# Patient Record
Sex: Male | Born: 1935 | Race: White | Hispanic: No | Marital: Married | Smoking: Former smoker
Health system: Southern US, Community
[De-identification: ages and names within clinical notes are randomized; demographics above are authoritative.]

## PROBLEM LIST (undated history)

## (undated) DIAGNOSIS — K5730 Diverticulosis of large intestine without perforation or abscess without bleeding: Secondary | ICD-10-CM

## (undated) DIAGNOSIS — J449 Chronic obstructive pulmonary disease, unspecified: Secondary | ICD-10-CM

## (undated) DIAGNOSIS — Z9889 Other specified postprocedural states: Secondary | ICD-10-CM

## (undated) DIAGNOSIS — G4730 Sleep apnea, unspecified: Secondary | ICD-10-CM

## (undated) DIAGNOSIS — C679 Malignant neoplasm of bladder, unspecified: Secondary | ICD-10-CM

## (undated) DIAGNOSIS — T4145XA Adverse effect of unspecified anesthetic, initial encounter: Secondary | ICD-10-CM

## (undated) DIAGNOSIS — R351 Nocturia: Secondary | ICD-10-CM

## (undated) DIAGNOSIS — R0989 Other specified symptoms and signs involving the circulatory and respiratory systems: Secondary | ICD-10-CM

## (undated) DIAGNOSIS — R0902 Hypoxemia: Secondary | ICD-10-CM

## (undated) DIAGNOSIS — Z85850 Personal history of malignant neoplasm of thyroid: Secondary | ICD-10-CM

## (undated) DIAGNOSIS — E039 Hypothyroidism, unspecified: Secondary | ICD-10-CM

## (undated) DIAGNOSIS — I452 Bifascicular block: Secondary | ICD-10-CM

## (undated) DIAGNOSIS — E890 Postprocedural hypothyroidism: Secondary | ICD-10-CM

## (undated) DIAGNOSIS — Z9981 Dependence on supplemental oxygen: Secondary | ICD-10-CM

## (undated) DIAGNOSIS — K219 Gastro-esophageal reflux disease without esophagitis: Secondary | ICD-10-CM

## (undated) DIAGNOSIS — I519 Heart disease, unspecified: Secondary | ICD-10-CM

## (undated) DIAGNOSIS — D509 Iron deficiency anemia, unspecified: Secondary | ICD-10-CM

## (undated) DIAGNOSIS — M1990 Unspecified osteoarthritis, unspecified site: Secondary | ICD-10-CM

## (undated) DIAGNOSIS — I4510 Unspecified right bundle-branch block: Secondary | ICD-10-CM

## (undated) DIAGNOSIS — H269 Unspecified cataract: Secondary | ICD-10-CM

## (undated) DIAGNOSIS — N400 Enlarged prostate without lower urinary tract symptoms: Secondary | ICD-10-CM

## (undated) DIAGNOSIS — K5909 Other constipation: Secondary | ICD-10-CM

## (undated) DIAGNOSIS — I10 Essential (primary) hypertension: Secondary | ICD-10-CM

## (undated) DIAGNOSIS — Z86010 Personal history of colonic polyps: Secondary | ICD-10-CM

## (undated) DIAGNOSIS — Z85828 Personal history of other malignant neoplasm of skin: Secondary | ICD-10-CM

## (undated) DIAGNOSIS — J439 Emphysema, unspecified: Secondary | ICD-10-CM

## (undated) DIAGNOSIS — Z973 Presence of spectacles and contact lenses: Secondary | ICD-10-CM

## (undated) DIAGNOSIS — J984 Other disorders of lung: Secondary | ICD-10-CM

## (undated) DIAGNOSIS — E785 Hyperlipidemia, unspecified: Secondary | ICD-10-CM

## (undated) DIAGNOSIS — R0600 Dyspnea, unspecified: Secondary | ICD-10-CM

## (undated) HISTORY — PX: EYE SURGERY: SHX253

## (undated) HISTORY — PX: CATARACT EXTRACTION W/ INTRAOCULAR LENS  IMPLANT, BILATERAL: SHX1307

## (undated) HISTORY — PX: POLYPECTOMY: SHX149

## (undated) HISTORY — DX: Iron deficiency anemia, unspecified: D50.9

## (undated) HISTORY — DX: Hyperlipidemia, unspecified: E78.5

## (undated) HISTORY — PX: COLONOSCOPY: SHX174

## (undated) HISTORY — DX: Hypoxemia: R09.02

## (undated) HISTORY — PX: CARDIOVASCULAR STRESS TEST: SHX262

## (undated) HISTORY — DX: Emphysema, unspecified: J43.9

## (undated) HISTORY — DX: Unspecified cataract: H26.9

## (undated) HISTORY — DX: Essential (primary) hypertension: I10

## (undated) HISTORY — DX: Bifascicular block: I45.2

## (undated) HISTORY — DX: Personal history of malignant neoplasm of thyroid: Z85.850

## (undated) HISTORY — DX: Hypothyroidism, unspecified: E03.9

---

## 1989-01-11 HISTORY — PX: TOTAL THYROIDECTOMY: SHX2547

## 1997-07-19 ENCOUNTER — Other Ambulatory Visit: Admission: RE | Admit: 1997-07-19 | Discharge: 1997-07-19 | Payer: Self-pay | Admitting: *Deleted

## 1997-08-12 ENCOUNTER — Ambulatory Visit (HOSPITAL_COMMUNITY): Admission: RE | Admit: 1997-08-12 | Discharge: 1997-08-12 | Payer: Self-pay | Admitting: *Deleted

## 1997-08-21 ENCOUNTER — Ambulatory Visit (HOSPITAL_COMMUNITY): Admission: RE | Admit: 1997-08-21 | Discharge: 1997-08-21 | Payer: Self-pay | Admitting: *Deleted

## 1997-09-11 ENCOUNTER — Inpatient Hospital Stay (HOSPITAL_COMMUNITY): Admission: RE | Admit: 1997-09-11 | Discharge: 1997-09-12 | Payer: Self-pay

## 1998-09-13 ENCOUNTER — Emergency Department (HOSPITAL_COMMUNITY): Admission: EM | Admit: 1998-09-13 | Discharge: 1998-09-13 | Payer: Self-pay

## 2001-11-07 ENCOUNTER — Encounter: Admission: RE | Admit: 2001-11-07 | Discharge: 2001-11-07 | Payer: Self-pay | Admitting: Internal Medicine

## 2001-11-07 ENCOUNTER — Encounter: Payer: Self-pay | Admitting: Internal Medicine

## 2001-11-09 ENCOUNTER — Encounter: Payer: Self-pay | Admitting: Internal Medicine

## 2001-11-09 ENCOUNTER — Encounter: Admission: RE | Admit: 2001-11-09 | Discharge: 2001-11-09 | Payer: Self-pay | Admitting: Internal Medicine

## 2002-01-11 DIAGNOSIS — Z860100 Personal history of colon polyps, unspecified: Secondary | ICD-10-CM

## 2002-01-11 HISTORY — DX: Personal history of colonic polyps: Z86.010

## 2002-01-11 HISTORY — DX: Personal history of colon polyps, unspecified: Z86.0100

## 2002-04-19 ENCOUNTER — Ambulatory Visit (HOSPITAL_COMMUNITY): Admission: RE | Admit: 2002-04-19 | Discharge: 2002-04-19 | Payer: Self-pay | Admitting: Gastroenterology

## 2002-04-19 ENCOUNTER — Encounter (INDEPENDENT_AMBULATORY_CARE_PROVIDER_SITE_OTHER): Payer: Self-pay | Admitting: Specialist

## 2002-04-27 ENCOUNTER — Encounter: Admission: RE | Admit: 2002-04-27 | Discharge: 2002-04-27 | Payer: Self-pay | Admitting: Internal Medicine

## 2002-04-27 ENCOUNTER — Encounter: Payer: Self-pay | Admitting: Internal Medicine

## 2003-10-15 ENCOUNTER — Encounter: Admission: RE | Admit: 2003-10-15 | Discharge: 2003-10-15 | Payer: Self-pay | Admitting: Internal Medicine

## 2004-07-27 ENCOUNTER — Encounter: Admission: RE | Admit: 2004-07-27 | Discharge: 2004-07-27 | Payer: Self-pay | Admitting: Internal Medicine

## 2005-06-01 ENCOUNTER — Encounter: Admission: RE | Admit: 2005-06-01 | Discharge: 2005-06-01 | Payer: Self-pay | Admitting: Gastroenterology

## 2006-08-27 ENCOUNTER — Emergency Department (HOSPITAL_COMMUNITY): Admission: EM | Admit: 2006-08-27 | Discharge: 2006-08-27 | Payer: Self-pay | Admitting: Emergency Medicine

## 2007-09-11 ENCOUNTER — Encounter: Admission: RE | Admit: 2007-09-11 | Discharge: 2007-09-11 | Payer: Self-pay | Admitting: Internal Medicine

## 2010-03-09 ENCOUNTER — Other Ambulatory Visit: Payer: Self-pay | Admitting: Dermatology

## 2010-05-29 NOTE — Op Note (Signed)
   NAMEMALAQUIAS, Jose Lawson NO.:  [id number]   MEDICAL RECORD NO.:  [id number]                   PATIENT TYPE:  AMB   LOCATION:  ENDO                                 FACILITY:  [facility]   PHYSICIAN:  Alger C. Madilyn Fireman, M.D.                 DATE OF BIRTH:  November 12, 1925   DATE OF PROCEDURE:  04/19/2002  DATE OF DISCHARGE:                                 OPERATIVE REPORT   PROCEDURE:  Colonoscopy with polypectomy.   INDICATIONS FOR PROCEDURE:  Colon cancer screening.   DESCRIPTION OF PROCEDURE:  The patient was placed in the left lateral  decubitus position and placed on the pulse monitor with continuous low-flow  oxygen delivered by nasal cannula.  He was sedated with 50 mcg IV fentanyl  and 5 mg IV Versed.  The Olympus video colonoscope was inserted into the  rectum and advanced to the cecum, confirmed by transillumination at  McBurney's point and visualization of the ileocecal valve and appendiceal  orifice.  The prep was excellent.  The cecum appeared normal with no masses,  polyps, diverticula, or other mucosal abnormalities.  Within the ascending  colon, there was a 6 mm polyp that was removed by hot biopsy.  The remainder  of the ascending and transverse colon appeared normal.  In the descending  colon, there was an 1 cm polyp that was removed by snare.  The remainder of  the descending and sigmoid appeared normal.  No diverticula were seen.  In  the distal rectum at approximately 6 cm from the anal verge, there was a 1  cm pedunculated polyp that was removed by snare.  The scope was then  withdrawn and the patient returned to the recovery room in stable condition.  He tolerated the procedure well, and there were no immediate complications.   IMPRESSION:  Ascending and rectal polyps.   PLAN:  Await histology to determine method and interval for future colon  screening.                                               Zhamir C. Madilyn Fireman, M.D.    JCH/MEDQ  D:   04/19/2002  T:  04/19/2002  Job:  [id number]   cc:   Wilson Singer, M.D.  [address]  [place]  [place] [address]  Fax: [phone]

## 2010-06-15 ENCOUNTER — Other Ambulatory Visit: Payer: Self-pay | Admitting: Dermatology

## 2010-08-06 ENCOUNTER — Encounter: Payer: Self-pay | Admitting: Internal Medicine

## 2010-08-12 ENCOUNTER — Encounter: Payer: Self-pay | Admitting: Internal Medicine

## 2010-08-13 ENCOUNTER — Encounter: Payer: Self-pay | Admitting: Internal Medicine

## 2010-08-13 ENCOUNTER — Ambulatory Visit (INDEPENDENT_AMBULATORY_CARE_PROVIDER_SITE_OTHER): Payer: Medicare Other | Admitting: Internal Medicine

## 2010-08-13 DIAGNOSIS — R079 Chest pain, unspecified: Secondary | ICD-10-CM | POA: Insufficient documentation

## 2010-08-13 DIAGNOSIS — E785 Hyperlipidemia, unspecified: Secondary | ICD-10-CM | POA: Insufficient documentation

## 2010-08-13 DIAGNOSIS — I10 Essential (primary) hypertension: Secondary | ICD-10-CM | POA: Insufficient documentation

## 2010-08-13 MED ORDER — PANTOPRAZOLE SODIUM 40 MG PO TBEC: 40.0000 mg | DELAYED_RELEASE_TABLET | Freq: Every day | ORAL | Status: DC

## 2010-08-13 MED ORDER — NITROGLYCERIN 0.4 MG SL SUBL: 0.4000 mg | SUBLINGUAL_TABLET | SUBLINGUAL | Status: DC | PRN

## 2010-08-13 NOTE — Assessment & Plan Note (Signed)
Stable No change required today  

## 2010-08-13 NOTE — Assessment & Plan Note (Signed)
Jose Lawson presents today for cardiology consultation regarding chest pain.  His chest pain has both typical and atypical features.  His primary CAD risk factors include his age, HTN, HL, and former tobacco use.  He did have some improvement with PPI.  He will therefore continue PPI at this time. He will also continue ASA and I have provided sl NTG as well. I think that further cardiac risk stratefication is also necessary.  We will therefore offer entry into the Promise study (randomization to lexiscan myoview vs cardiac CT).  If study is low risk, we will manage medically.  If high risk study, then we will need to discuss cath.

## 2010-08-13 NOTE — Progress Notes (Signed)
Jose Lawson is a pleasant 77 y.o. WM patient with a h/o HTN who presents today for cardiology evaluation of chest pain.  He reports being in his usual state of health until last Wednesday when he developed epigastric discomfort.  He reports associated waterbrash sensation.  He attributes this to eating "a lot of tomatoes".  He woke the following morning with substernal chest "aching" 6/10.  This persisted several hours and therefore he presented to Dr Norval Gable office.  He was given protonix 20mg  daily.  He reports that his pain gradually resolved over the next 1-2 days.   He has been working in his wood shop this week without exertional symptoms.  He reports mild SOB with moderate activity.  He feels that this has been present for several years.  He denies arm/jaw, n/V, palpitations, presyncope or syncope.  He is otherwise without complaint today.    Past Medical History  Diagnosis Date  . Hypertension   . Hx of thyroid cancer     s/p total thyroidectomy,  . Hypothyroidism   . Hyperlipidemia   . 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 Thyroidectomy 1991    for Ca  . Cystectomy     Current Outpatient Prescriptions  Medication Sig Dispense Refill  . aspirin 81 MG tablet Take 81 mg by mouth daily.        [name] atorvastatin (LIPITOR) 20 MG tablet Take 1 tablet by mouth Daily.      [name] ipratropium (ATROVENT) 0.06 % nasal spray as directed.      [name] SYNTHROID 150 MCG tablet Take 1 tablet by mouth Daily.      [name] triamterene-hydrochlorothiazide (DYAZIDE) 37.5-25 MG per capsule Take 1 tablet by mouth Daily.        No Known Allergies  History   Social History  . Marital Status: Married    Spouse Name: N/A    Number of Children: N/A  . Years of Education: N/A   Occupational History  . Not on file.   Social History Main Topics  . Smoking status: Former Smoker    Quit date: 05/12/1981  . Smokeless tobacco: Not  on file   Comment: quit 1983  . Alcohol Use: Yes     6-8 oz vodka per day  . Drug Use: No  . Sexually Active: Not on file   Other Topics Concern  . Not on file   Social History Narrative   Lives in [place] with his spouse.Retired from [employer].    Family History  Problem Relation Age of Onset  . Lung cancer Brother   . Thyroid cancer    . Diabetes Father     ROS- All systems are reviewed and negative except as per the HPI above  Physical Exam: Filed Vitals:   08/13/10 1204  BP: 142/83  Pulse: 73  Height: 6\' 4"  (1.93 m)  Weight: 201 lb (91.173 kg)    GEN- The patient is well appearing, alert and oriented x 3 today.   Head- normocephalic, atraumatic Eyes-  Sclera clear, conjunctiva pink Ears- hearing intact Oropharynx- clear Neck- supple, no JVP Lymph- no cervical lymphadenopathy Lungs- Clear to ausculation bilaterally, normal work of breathing Heart- Regular rate and rhythm, no murmurs, rubs or gallops, PMI not laterally displaced GI- soft, NT, ND, + BS Extremities- no clubbing, cyanosis, or edema MS- no significant deformity or atrophy Skin- no rash or lesion Psych- euthymic mood,  full affect Neuro- strength and sensation are intact  EKG today reveals sinus rhythm RBBB, no ischemic changes  Assessment and Plan:

## 2010-08-13 NOTE — Patient Instructions (Addendum)
Please have blood work today.  Please start Protonix 40 mg a day.  A prescription has been sent into your pharmacy for SL Ntg.  Please use as directed for chest pain  The current medical regimen is effective;  continue present plan and medications.  You are being scheduled for a stress test to evaluate your chest pain.  Please follow the instructions given.   Follow up with Dr Johney Frame in 2 weeks.

## 2010-08-14 ENCOUNTER — Other Ambulatory Visit: Payer: Self-pay | Admitting: *Deleted

## 2010-08-14 DIAGNOSIS — R0789 Other chest pain: Secondary | ICD-10-CM

## 2010-08-14 LAB — BASIC METABOLIC PANEL
BUN: 19 mg/dL (ref 6–23)
Calcium: 9.2 mg/dL (ref 8.4–10.5)
Chloride: 99 mEq/L (ref 96–112)
GFR: 79.54 mL/min (ref >60.00)
Sodium: 139 mEq/L (ref 135–145)

## 2010-08-17 ENCOUNTER — Ambulatory Visit (HOSPITAL_COMMUNITY): Payer: Medicare Other | Attending: Internal Medicine | Admitting: Radiology

## 2010-08-17 ENCOUNTER — Encounter: Payer: Self-pay | Admitting: *Deleted

## 2010-08-17 VITALS — Ht 76.0 in | Wt 201.0 lb

## 2010-08-17 DIAGNOSIS — I4510 Unspecified right bundle-branch block: Secondary | ICD-10-CM

## 2010-08-17 DIAGNOSIS — I440 Atrioventricular block, first degree: Secondary | ICD-10-CM

## 2010-08-17 DIAGNOSIS — R0609 Other forms of dyspnea: Secondary | ICD-10-CM

## 2010-08-17 DIAGNOSIS — R0789 Other chest pain: Secondary | ICD-10-CM

## 2010-08-17 DIAGNOSIS — R0989 Other specified symptoms and signs involving the circulatory and respiratory systems: Secondary | ICD-10-CM

## 2010-08-17 MED ORDER — REGADENOSON 0.4 MG/5ML IV SOLN
0.4000 mg | Freq: Once | INTRAVENOUS | Status: AC
  Administered 2010-08-17: 0.4 mg via INTRAVENOUS

## 2010-08-17 MED ORDER — TECHNETIUM TC 99M TETROFOSMIN IV KIT
33.0000 | PACK | Freq: Once | INTRAVENOUS | Status: AC | PRN
  Administered 2010-08-17: 33 via INTRAVENOUS

## 2010-08-17 MED ORDER — TECHNETIUM TC 99M TETROFOSMIN IV KIT
11.0000 | PACK | Freq: Once | INTRAVENOUS | Status: AC | PRN
  Administered 2010-08-17: 11 via INTRAVENOUS

## 2010-08-17 NOTE — Progress Notes (Signed)
[facility] SITE 3 NUCLEAR MED [address] [place] [address] [phone]  Cardiology Nuclear Med Study  Jose Lawson is a 77 y.o. male [id number] 1925-01-23   Nuclear Med Background Indication for Stress Test:  Evaluation for Ischemia History:  No previous documented CAD and 2006 MPS: Ok per patient Cardiac Risk Factors: History of Smoking, Hypertension, Lipids and RBBB  Symptoms:  Chest Pain/Chest Pressure with and without Exertion (last date of chest discomfort one week ago), DOE and SOB   Nuclear Pre-Procedure Caffeine/Decaff Intake:  None NPO After: 630pm   Lungs:  Clear IV 0.9% NS with Angio Cath:  22g  IV Site: R Hand  IV Started by:  Cathlyn Parsons, RN  Chest Size (in):  44 Cup Size: n/a  Height: 6\' 4"  (1.93 m)  Weight:  201 lb (91.173 kg)  BMI:  Body mass index is 24.47 kg/(m^2). Tech Comments:  na    Nuclear Med Study 1 or 2 day study: 1 day  Stress Test Type:  Treadmill/Lexiscan  Reading MD: Olga Millers, MD  Order Authorizing Provider:  Hillis Range,  MD  Resting Radionuclide: Technetium 78m Tetrofosmin  Resting Radionuclide Dose: 11.0 mCi   Stress Radionuclide:  Technetium 41m Tetrofosmin  Stress Radionuclide Dose: 33.0 mCi           Stress Protocol Rest HR: 61 Stress HR: 97  Rest BP: 128/77 Stress BP: 204/88  Exercise Time (min): 2:00 METS: n/a   Predicted Max HR: 138 bpm % Max HR: 70.29 bpm Rate Pressure Product: 21308   Dose of Adenosine (mg):  n/a Dose of Lexiscan: 0.4 mg  Dose of Atropine (mg): n/a Dose of Dobutamine: n/a mcg/kg/min (at max HR)  Stress Test Technologist: Irean Hong, RN  Nuclear Technologist:  Doyne Keel, CNMT     Rest Procedure:  Myocardial perfusion imaging was performed at rest 45 minutes following the intravenous administration of Technetium 23m Tetrofosmin. Rest ECG: NSR 1st Degree AVB; RBBB; Sinus Arrhythmia  Stress Procedure:  The patient received IV Lexiscan 0.4 mg over 15-seconds  with concurrent low level exercise.  Technetium 25m Tetrofosmin was injected at 30-seconds while the patient continued walking.  There were no significant changes with Lexiscan. There was sinus arrhythmia and rare PAC.  Quantitative spect images were obtained after a 45-minute delay. Stress ECG: No significant ST segment change suggestive of ischemia.  QPS Raw Data Images:  Acquisition technically good; normal left ventricular size. Stress Images:  There is decreased uptake in the inferior wall at the base. Rest Images:  There is decreased uptake in the inferior wall at the base. Subtraction (SDS):  No evidence of ischemia. Transient Ischemic Dilatation (Normal <1.22):  1.16 Lung/Heart Ratio (Normal <0.45):  0.26  Quantitative Gated Spect Images QGS EDV:  75 ml QGS ESV:  28 ml QGS cine images:  NL LV Function; NL Wall Motion QGS EF: 63%  Impression Exercise Capacity:  Lexiscan with low level exercise. BP Response:  Normal blood pressure response. Clinical Symptoms:  No chest pain. ECG Impression:  No significant ST segment change suggestive of ischemia. Comparison with Prior Nuclear Study: No images to compare  Overall Impression:  Low risk stress nuclear study with prominent inferobasal thinning vs small prior infact; no ischemia.  Olga Millers

## 2010-08-18 NOTE — Progress Notes (Signed)
nuc med report routed to Dr Johney Frame 08/17/10 Jose Lawson

## 2010-08-26 ENCOUNTER — Encounter: Payer: Self-pay | Admitting: Internal Medicine

## 2010-08-26 ENCOUNTER — Ambulatory Visit (INDEPENDENT_AMBULATORY_CARE_PROVIDER_SITE_OTHER): Payer: Medicare Other | Admitting: Internal Medicine

## 2010-08-26 DIAGNOSIS — E785 Hyperlipidemia, unspecified: Secondary | ICD-10-CM

## 2010-08-26 DIAGNOSIS — I10 Essential (primary) hypertension: Secondary | ICD-10-CM

## 2010-08-26 DIAGNOSIS — R079 Chest pain, unspecified: Secondary | ICD-10-CM

## 2010-08-26 MED ORDER — PANTOPRAZOLE SODIUM 40 MG PO TBEC: 40.0000 mg | DELAYED_RELEASE_TABLET | Freq: Every day | ORAL | Status: DC

## 2010-08-26 NOTE — Assessment & Plan Note (Signed)
Per Dr Eloise Harman

## 2010-08-26 NOTE — Assessment & Plan Note (Signed)
Stable No change required today  

## 2010-08-26 NOTE — Progress Notes (Signed)
Jose Lawson is a pleasant 77 y.o. WM patient with a h/o HTN who presents today for cardiology follow-up of chest pain.  He reports doing very well since last being seen in my office.  His epigastric chest discomfort has resolved with protonix.  He denies any further episodes of chest pain.  He remains active. He reports mild SOB with moderate activity.  He feels that this has been present for several years.  He denies arm/jaw, n/V, palpitations, presyncope or syncope.  He is otherwise without complaint today.    Past Medical History  Diagnosis Date  . Hypertension   . Hx of thyroid cancer     s/p total thyroidectomy,  . Hypothyroidism   . Hyperlipidemia   . 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 Thyroidectomy 1991    for Ca  . Cystectomy     Current Outpatient Prescriptions  Medication Sig Dispense Refill  . aspirin 81 MG tablet Take 81 mg by mouth daily.        [name] atorvastatin (LIPITOR) 20 MG tablet Take 1 tablet by mouth Daily.      [name] ipratropium (ATROVENT) 0.06 % nasal spray as directed.      . Multiple Vitamins-Minerals (CENTRUM) tablet Take 1 tablet by mouth daily.        . nitroGLYCERIN (NITROSTAT) 0.4 MG SL tablet Place 1 tablet (0.4 mg total) under the tongue every 5 (five) minutes as needed for chest pain.  25 tablet  12  . pantoprazole (PROTONIX) 40 MG tablet Take 1 tablet (40 mg total) by mouth daily.  90 tablet  3  . SYNTHROID 150 MCG tablet Take 1 tablet by mouth Daily.      [name] triamterene-hydrochlorothiazide (DYAZIDE) 37.5-25 MG per capsule Take 1 tablet by mouth Daily.      [name] DISCONTD: pantoprazole (PROTONIX) 40 MG tablet Take 1 tablet (40 mg total) by mouth daily.  30 tablet  11    No Known Allergies  History   Social History  . Marital Status: Married    Spouse Name: N/A    Number of Children: N/A  . Years of Education: N/A   Occupational History  . Not on file.   Social  History Main Topics  . Smoking status: Former Smoker    Quit date: 05/12/1981  . Smokeless tobacco: Not on file   Comment: quit 1983  . Alcohol Use: Yes     6-8 oz vodka per day  . Drug Use: No  . Sexually Active: Not on file   Other Topics Concern  . Not on file   Social History Narrative   Lives in [place] with his spouse.Retired from [employer].    Family History  Problem Relation Age of Onset  . Lung cancer Brother   . Thyroid cancer    . Diabetes Father     Physical Exam: Filed Vitals:   08/26/10 1457  BP: 123/70  Pulse: 68  Height: 6\' 4"  (1.93 m)  Weight: 204 lb (92.534 kg)    GEN- The patient is well appearing, alert and oriented x 3 today.   Head- normocephalic, atraumatic Eyes-  Sclera clear, conjunctiva pink Ears- hearing intact Oropharynx- clear Neck- supple, no JVP Lymph- no cervical lymphadenopathy Lungs- Clear to ausculation bilaterally, normal work of breathing Heart- Regular rate and rhythm, no murmurs, rubs or gallops, PMI not laterally displaced GI- soft, NT, ND, + BS Extremities-  no clubbing, cyanosis, or edema MS- no significant deformity or atrophy Skin- no rash or lesion Psych- euthymic mood, full affect Neuro- strength and sensation are intact  Lexiscan myoview from 08/17/10 reveals low risk stress nuclear study with prominent inferobasal thinning vs small prior infact; no ischemia.  Assessment and Plan:

## 2010-08-26 NOTE — Patient Instructions (Signed)
Your physician recommends that you schedule a follow-up appointment as needed  

## 2010-08-26 NOTE — Assessment & Plan Note (Signed)
Resolved with PPI, likely GI in origin  I have reviewed his low risk myoview with him in detail today. No further cardiology testing is planned at this time.  He will follow-up with Dr Eloise Harman and I will see him as needed.

## 2010-09-01 NOTE — Progress Notes (Signed)
Reviewed at appt with Dr Johney Frame

## 2010-09-21 ENCOUNTER — Other Ambulatory Visit: Payer: Self-pay | Admitting: Dermatology

## 2010-10-23 LAB — I-STAT 8, (EC8 V) (CONVERTED LAB)
Acid-Base Excess: 6 — ABNORMAL HIGH
BUN: 20
Bicarbonate: 30.2 — ABNORMAL HIGH
Chloride: 105
HCT: 44
Sodium: 137
pCO2, Ven: 39.9 — ABNORMAL LOW

## 2010-10-23 LAB — DIFFERENTIAL
Lymphs Abs: 1.3
Monocytes Absolute: 0.6
Monocytes Relative: 8
Neutrophils Relative %: 68

## 2010-10-23 LAB — D-DIMER, QUANTITATIVE: D-Dimer, Quant: <0.22

## 2010-10-23 LAB — CBC
HCT: 41.7
Hemoglobin: 14.1
MCHC: 33.8
WBC: 6.7

## 2010-10-23 LAB — POCT CARDIAC MARKERS: Myoglobin, poc: 113

## 2010-10-23 LAB — POCT I-STAT CREATININE: Operator id: 196461

## 2010-11-16 ENCOUNTER — Other Ambulatory Visit: Payer: Self-pay | Admitting: Urology

## 2010-12-11 ENCOUNTER — Encounter (HOSPITAL_BASED_OUTPATIENT_CLINIC_OR_DEPARTMENT_OTHER): Payer: Self-pay | Admitting: *Deleted

## 2010-12-11 NOTE — Progress Notes (Signed)
NPO AFTER MN. PT ARRIVES AT 0715. NEEDS ISTAT. CURRENT EKG IN EPIC. WILL TAKE SYNTHROID, PROTONIX, AND LIPITOR AM OF SURG. W/ SIPS OF WATER.

## 2010-12-12 DIAGNOSIS — T8859XA Other complications of anesthesia, initial encounter: Secondary | ICD-10-CM

## 2010-12-12 HISTORY — DX: Other complications of anesthesia, initial encounter: T88.59XA

## 2010-12-16 ENCOUNTER — Encounter (HOSPITAL_BASED_OUTPATIENT_CLINIC_OR_DEPARTMENT_OTHER): Payer: Self-pay | Admitting: *Deleted

## 2010-12-16 ENCOUNTER — Encounter (HOSPITAL_BASED_OUTPATIENT_CLINIC_OR_DEPARTMENT_OTHER)
Admission: RE | Disposition: A | Discharge status: Home / Self Care | Payer: Self-pay | Source: Ambulatory Visit | Attending: Urology

## 2010-12-16 ENCOUNTER — Encounter (HOSPITAL_BASED_OUTPATIENT_CLINIC_OR_DEPARTMENT_OTHER): Payer: Self-pay | Admitting: Anesthesiology

## 2010-12-16 ENCOUNTER — Ambulatory Visit (HOSPITAL_BASED_OUTPATIENT_CLINIC_OR_DEPARTMENT_OTHER): Payer: Medicare Other | Admitting: Anesthesiology

## 2010-12-16 ENCOUNTER — Ambulatory Visit (HOSPITAL_BASED_OUTPATIENT_CLINIC_OR_DEPARTMENT_OTHER)
Admission: RE | Admit: 2010-12-16 | Discharge: 2010-12-16 | Disposition: A | Discharge status: Home / Self Care | Payer: Medicare Other | Source: Ambulatory Visit | Attending: Urology | Admitting: Urology

## 2010-12-16 ENCOUNTER — Other Ambulatory Visit: Payer: Self-pay | Admitting: Urology

## 2010-12-16 DIAGNOSIS — R3129 Other microscopic hematuria: Secondary | ICD-10-CM | POA: Insufficient documentation

## 2010-12-16 DIAGNOSIS — D494 Neoplasm of unspecified behavior of bladder: Secondary | ICD-10-CM | POA: Insufficient documentation

## 2010-12-16 DIAGNOSIS — D090 Carcinoma in situ of bladder: Secondary | ICD-10-CM | POA: Insufficient documentation

## 2010-12-16 HISTORY — PX: TRANSURETHRAL RESECTION OF BLADDER TUMOR: SHX2575

## 2010-12-16 HISTORY — PX: CYSTOSCOPY WITH BIOPSY: SHX5122

## 2010-12-16 HISTORY — DX: Personal history of other malignant neoplasm of skin: Z98.890

## 2010-12-16 HISTORY — DX: Gastro-esophageal reflux disease without esophagitis: K21.9

## 2010-12-16 HISTORY — DX: Nocturia: R35.1

## 2010-12-16 HISTORY — PX: CYSTOSCOPY/RETROGRADE/URETEROSCOPY: SHX5316

## 2010-12-16 HISTORY — DX: Other specified postprocedural states: Z85.828

## 2010-12-16 HISTORY — DX: Unspecified osteoarthritis, unspecified site: M19.90

## 2010-12-16 LAB — POCT I-STAT 4, (NA,K, GLUC, HGB,HCT)
Glucose, Bld: 96 mg/dL (ref 70–99)
HCT: 42 % (ref 39.0–52.0)
Potassium: 4 mEq/L (ref 3.5–5.1)
Sodium: 142 mEq/L (ref 135–145)

## 2010-12-16 SURGERY — CYSTOSCOPY, WITH BIOPSY
Anesthesia: General | Site: Ureter | Laterality: Right | Wound class: Clean Contaminated

## 2010-12-16 MED ORDER — PROPOFOL 10 MG/ML IV EMUL
INTRAVENOUS | Status: DC | PRN
  Administered 2010-12-16: 150 mg via INTRAVENOUS

## 2010-12-16 MED ORDER — PHENAZOPYRIDINE HCL 100 MG PO TABS: 100.0000 mg | ORAL_TABLET | Freq: Three times a day (TID) | ORAL | Status: AC | PRN

## 2010-12-16 MED ORDER — CEFAZOLIN SODIUM 1-5 GM-% IV SOLN
1.0000 g | INTRAVENOUS | Status: AC
  Administered 2010-12-16: 2 g via INTRAVENOUS

## 2010-12-16 MED ORDER — FENTANYL CITRATE 0.05 MG/ML IJ SOLN
25.0000 ug | INTRAMUSCULAR | Status: DC | PRN
  Administered 2010-12-16 (×2): 25 ug via INTRAVENOUS

## 2010-12-16 MED ORDER — ASPIRIN 81 MG PO TABS: 81.0000 mg | ORAL_TABLET | Freq: Every day | ORAL | Status: DC

## 2010-12-16 MED ORDER — SODIUM CHLORIDE 0.9 % IR SOLN
Status: DC | PRN
  Administered 2010-12-16: 6000 mL

## 2010-12-16 MED ORDER — LIDOCAINE HCL (CARDIAC) 20 MG/ML IV SOLN
INTRAVENOUS | Status: DC | PRN
  Administered 2010-12-16: 50 mg via INTRAVENOUS

## 2010-12-16 MED ORDER — HYDROCODONE-ACETAMINOPHEN 5-325 MG PO TABS
1.0000 | ORAL_TABLET | ORAL | Status: DC | PRN
  Administered 2010-12-16: 1 via ORAL

## 2010-12-16 MED ORDER — ONDANSETRON HCL 4 MG/2ML IJ SOLN
INTRAMUSCULAR | Status: DC | PRN
  Administered 2010-12-16: 4 mg via INTRAVENOUS

## 2010-12-16 MED ORDER — CEPHALEXIN 500 MG PO CAPS: 500.0000 mg | ORAL_CAPSULE | Freq: Three times a day (TID) | ORAL | Status: AC

## 2010-12-16 MED ORDER — SUCCINYLCHOLINE CHLORIDE 20 MG/ML IJ SOLN
INTRAMUSCULAR | Status: DC | PRN
  Administered 2010-12-16: 140 mg via INTRAVENOUS

## 2010-12-16 MED ORDER — FENTANYL CITRATE 0.05 MG/ML IJ SOLN
INTRAMUSCULAR | Status: DC | PRN
  Administered 2010-12-16: 50 ug via INTRAVENOUS
  Administered 2010-12-16 (×2): 25 ug via INTRAVENOUS

## 2010-12-16 MED ORDER — PHENAZOPYRIDINE HCL 100 MG PO TABS
100.0000 mg | ORAL_TABLET | Freq: Three times a day (TID) | ORAL | Status: DC
  Administered 2010-12-16: 100 mg via ORAL

## 2010-12-16 MED ORDER — INDIGOTINDISULFONATE SODIUM 8 MG/ML IJ SOLN
INTRAMUSCULAR | Status: DC | PRN
  Administered 2010-12-16: 5 mL via INTRAVENOUS

## 2010-12-16 MED ORDER — STERILE WATER FOR IRRIGATION IR SOLN
Status: DC | PRN
  Administered 2010-12-16: 500 mL

## 2010-12-16 MED ORDER — LACTATED RINGERS IV SOLN
INTRAVENOUS | Status: DC
  Administered 2010-12-16: 12:00:00 via INTRAVENOUS

## 2010-12-16 MED ORDER — LACTATED RINGERS IV SOLN
INTRAVENOUS | Status: DC
  Administered 2010-12-16 (×2): via INTRAVENOUS

## 2010-12-16 MED ORDER — LIDOCAINE HCL 2 % EX GEL
CUTANEOUS | Status: DC | PRN
  Administered 2010-12-16: 1 via URETHRAL

## 2010-12-16 MED ORDER — PROMETHAZINE HCL 25 MG/ML IJ SOLN
6.2500 mg | INTRAMUSCULAR | Status: DC | PRN
  Administered 2010-12-16: 6.25 mg via INTRAVENOUS

## 2010-12-16 MED ORDER — DEXAMETHASONE SODIUM PHOSPHATE 4 MG/ML IJ SOLN
INTRAMUSCULAR | Status: DC | PRN
  Administered 2010-12-16: 10 mg via INTRAVENOUS

## 2010-12-16 MED ORDER — SODIUM CHLORIDE 0.9 % IV SOLN
10.0000 mg | INTRAVENOUS | Status: DC | PRN
  Administered 2010-12-16: 20 ug/min via INTRAVENOUS

## 2010-12-16 MED ORDER — HYDROCODONE-ACETAMINOPHEN 5-325 MG PO TABS: 1.0000 | ORAL_TABLET | ORAL | Status: AC | PRN

## 2010-12-16 SURGICAL SUPPLY — 41 items
ADAPTER CATH URET PLST 4-6FR (CATHETERS) ×4 IMPLANT
BAG DRAIN URO-CYSTO SKYTR STRL (DRAIN) ×4 IMPLANT
BAG URINE DRAINAGE (UROLOGICAL SUPPLIES) ×4 IMPLANT
BAG URINE LEG 19OZ MD ST LTX (BAG) IMPLANT
CANISTER SUCT LVC 12 LTR MEDI- (MISCELLANEOUS) ×4 IMPLANT
CATH AMS URETHRAL MEASURING (INSTRUMENTS) ×4 IMPLANT
CATH COUDE FOLEY 2W 5CC 16FR (CATHETERS) ×4 IMPLANT
CATH FOLEY 2WAY SLVR  5CC 20FR (CATHETERS)
CATH FOLEY 2WAY SLVR  5CC 22FR (CATHETERS)
CATH FOLEY 2WAY SLVR  5CC 24FR (CATHETERS)
CATH FOLEY 2WAY SLVR 5CC 20FR (CATHETERS) IMPLANT
CATH FOLEY 2WAY SLVR 5CC 22FR (CATHETERS) IMPLANT
CATH FOLEY 2WAY SLVR 5CC 24FR (CATHETERS) IMPLANT
CATH HEMA 3WAY 30CC 24FR COUDE (CATHETERS) IMPLANT
CATH HEMA 3WAY 30CC 24FR RND (CATHETERS) IMPLANT
CATH URET 5FR 28IN OPEN ENDED (CATHETERS) ×4 IMPLANT
CLOTH BEACON ORANGE TIMEOUT ST (SAFETY) ×4 IMPLANT
DRAPE CAMERA CLOSED 9X96 (DRAPES) ×4 IMPLANT
ELECT BUTTON BIOP 24F 90D PLAS (MISCELLANEOUS) IMPLANT
ELECT BUTTON HF 24-28F 2 30DE (ELECTRODE) IMPLANT
ELECT LOOP MED HF 24F 12D (CUTTING LOOP) IMPLANT
ELECT REM PT RETURN 9FT ADLT (ELECTROSURGICAL) ×4
ELECTRODE REM PT RTRN 9FT ADLT (ELECTROSURGICAL) ×3 IMPLANT
EVACUATOR MICROVAS BLADDER (UROLOGICAL SUPPLIES) IMPLANT
GLOVE BIO SURGEON STRL SZ8 (GLOVE) ×4 IMPLANT
GLOVE BIOGEL PI IND STRL 6.5 (GLOVE) ×6 IMPLANT
GLOVE BIOGEL PI INDICATOR 6.5 (GLOVE) ×2
GLOVE ECLIPSE 7.0 STRL STRAW (GLOVE) ×4 IMPLANT
GLOVE INDICATOR 7.5 STRL GRN (GLOVE) ×4 IMPLANT
GOWN PREVENTION PLUS LG XLONG (DISPOSABLE) ×4 IMPLANT
GOWN STRL REIN XL XLG (GOWN DISPOSABLE) ×4 IMPLANT
GUIDEWIRE STR DUAL SENSOR (WIRE) ×8 IMPLANT
HOLDER FOLEY CATH W/STRAP (MISCELLANEOUS) ×4 IMPLANT
IV NS IRRIG 3000ML ARTHROMATIC (IV SOLUTION) ×8 IMPLANT
KIT ASPIRATION TUBING (SET/KITS/TRAYS/PACK) IMPLANT
NEEDLE HYPO 22GX1.5 SAFETY (NEEDLE) IMPLANT
NS IRRIG 500ML POUR BTL (IV SOLUTION) IMPLANT
PACK CYSTOSCOPY (CUSTOM PROCEDURE TRAY) ×4 IMPLANT
PLUG CATH AND CAP STER (CATHETERS) IMPLANT
SHEATH ACCESS URETERAL 38CM (SHEATH) ×4 IMPLANT
WATER STERILE IRR 3000ML UROMA (IV SOLUTION) ×8 IMPLANT

## 2010-12-16 NOTE — Anesthesia Postprocedure Evaluation (Signed)
Anesthesia Post Note  Patient: Jose Lawson  Procedure(s) Performed:  CYSTOSCOPY WITH BIOPSY; TRANSURETHRAL RESECTION OF BLADDER TUMOR (TURBT); CYSTOSCOPY/RETROGRADE/URETEROSCOPY - bilateral retrograde, right ureteroscopy  Anesthesia type: General  Patient location: PACU  Post pain: Pain level controlled  Post assessment: Post-op Vital signs reviewed  Last Vitals:  Filed Vitals:   12/16/10 1100  BP: 164/74  Pulse: 62  Temp:   Resp: 12    Post vital signs: Reviewed  Level of consciousness: sedated  Complications: No apparent anesthesia complications

## 2010-12-16 NOTE — Addendum Note (Signed)
Addendum  created 12/16/10 1230 by Huel Coventry   Modules edited:Anesthesia Blocks and Procedures, Inpatient Notes

## 2010-12-16 NOTE — Addendum Note (Signed)
Addendum  created 12/16/10 1230 by Taeler Winning   Modules edited:Anesthesia Blocks and Procedures, Inpatient Notes    

## 2010-12-16 NOTE — Transfer of Care (Signed)
Immediate Anesthesia Transfer of Care Note  Patient: Jose Lawson  Procedure(s) Performed:  CYSTOSCOPY WITH BIOPSY; TRANSURETHRAL RESECTION OF BLADDER TUMOR (TURBT); CYSTOSCOPY/RETROGRADE/URETEROSCOPY - bilateral retrograde, right ureteroscopy  Patient Location: PACU  Anesthesia Type: General  Level of Consciousness: awake, alert  and oriented  Airway & Oxygen Therapy: Patient Spontanous Breathing and Patient connected to face mask oxygen  Post-op Assessment: Report given to PACU RN and Post -op Vital signs reviewed and stable  Post vital signs: Reviewed and stable  Complications: No apparent anesthesia complications

## 2010-12-16 NOTE — Anesthesia Procedure Notes (Addendum)
Performed by: Huel Coventry   Procedure Name: Intubation Date/Time: 12/16/2010 8:41 AM Performed by: Huel Coventry Pre-anesthesia Checklist: Patient identified, Emergency Drugs available, Suction available and Patient being monitored Patient Re-evaluated:Patient Re-evaluated prior to inductionOxygen Delivery Method: Circle System Utilized Preoxygenation: Pre-oxygenation with 100% oxygen Intubation Type: IV induction Ventilation: Mask ventilation without difficulty Tube type: Oral Tube size: 8.0 mm Number of attempts: 1 Airway Equipment and Method: stylet,  oral airway and video-laryngoscopy Placement Confirmation: ETT inserted through vocal cords under direct vision,  positive ETCO2 and breath sounds checked- equal and bilateral Secured at: 23 cm Tube secured with: Tape Dental Injury: Teeth and Oropharynx as per pre-operative assessment  Difficulty Due To: Difficulty was anticipated and Difficult Airway- due to reduced neck mobility Comments: Glidescope handle used. Intubated under direct view.

## 2010-12-16 NOTE — Op Note (Signed)
NAMETAREZ, BOWNS NO.:  [id number]  MEDICAL RECORD NO.:  [id number]  LOCATION:                               FACILITY:  [facility]  PHYSICIAN:  Natalia Leatherwood, MD    DATE OF BIRTH:  01/20/1925  DATE OF PROCEDURE:  12/16/2010 DATE OF DISCHARGE:                              OPERATIVE REPORT   SURGEON:  Natalia Leatherwood, MD  ASSISTANT:  None.  PREOPERATIVE DIAGNOSIS:  Bladder tumor.  POSTOPERATIVE DIAGNOSIS:  Bladder tumor.  PROCEDURES PERFORMED: 1. Cystoscopy. 2. Bilateral retrograde pyelogram. 3. Right ureteroscopy. 4. Bladder biopsy. 5. Fluoroscopy.  FINDINGS:  Multiple small bladder tumors on the left lateral bladder wall, and right distal ureteral narrowing with no lesions in the upper urinary tract on the right side or ureter on the right side.  No filling defects on the left side retrograde pyelogram.  SPECIMEN:  Bladder biopsy sent, left lateral wall biopsy as one and then left bladder floor as a separate biopsy. (The pathology report was incorrectly labeled as being from the right side of the bladder.)  COMPLICATIONS:  None.  ESTIMATED BLOOD LOSS:  5 cc.  HISTORY OF PRESENT ILLNESS:  This 77 year old gentleman who presented with microscopic hematuria.  During workup, he had cystoscopy which revealed some small bladder tumors and his CT scan had no filling defects that the right distal ureter was not completely opacified.  He was recommend he proceed to the operating room for cystoscopy, bladder biopsy, and bilateral retrograde pyelogram with possible bilateral ureteroscopy.  He presents today for that elective procedure.  PROCEDURE:  After informed consent was obtained, the patient was taken to the operating room, where he was placed supine position.  IV antibiotics were infused and general anesthesia was induced.  He was then placed in dorsal lithotomy position where all pertinent neurovascular pressure points were padded appropriately and  his genitals were then prepped and draped in usual fashion.  Following this, time-out was performed in which the correct patient, surgical site, and procedure were identified and agreed upon by the team.  Following this, a 30 degree rigid cystoscope was advanced through the urethra into the bladder.  The bladder was evaluated in systematic fashion with a 12 degree, 30 degree, and 70-degree lens.  There is noted to be bladder tumor, superior and lateral to the left ureteral orifice, and on the left lateral bladder wall.  The bilateral ureteral orifices were identified.  He had a large amount of trabeculations throughout his bladder.  Left retrograde pyelogram was obtained by placing an open- ended 5-French ureteral catheter and retrograde pyelogram was shot with fluoroscopy.  There are no filling defects in the upper tract or the ureter.  This side emptied out well.  The right side, there was some narrowing at the distal ureter and a retrograde pyelogram was obtained. It is difficult to fully evaluate the distal right ureter.  This was also the area that did not opacify well on CT scan.  Therefore, I elected to perform a right ureteroscopy.  Two sensor tip wires were placed up the ureter under fluoroscopy with good curl in the right renal pelvis.  The internal obturator for ureteral access sheath  was placed up gently with ease and left in place for 1-2 minutes.  After this was done, flexible digital ureteroscope was placed over the wire with ease and the working wire was removed.  Renal pelvis was evaluated in a systematic fashion.  There are no renal tumors in the renal pelvis and then the entirety of the ureter was visualized.  There were no tumors or lesions throughout the ureter.  Next, the safety wire was removed. Finally cold cup biopsy forceps was used to remove the tumors located on the left bladder wall.  They were sent separately as the left bladder floor for the tumor near the  ureteral orifice, and the left lateral bladder wall.  The tumor near the ureteral orifice was not invading the area around the orifice.  Next, a Bugbee electrode was used to fulgurate the biopsy sites. Both ureteral orifices were then evaluated and seen to be effluxing clear urine.  Following this, the cystoscope was removed and 10 mL of lidocaine jelly were placed into the urethra and then a 16-French coude catheter was placed into the bladder with 10 cc sterile water placed in the balloon.  This would be kept in place until the patient is in the PACU and then be removed.          ______________________________ Natalia Leatherwood, MD     DW/MEDQ  D:  12/16/2010  T:  12/16/2010  Job:  [id number]

## 2010-12-16 NOTE — Anesthesia Preprocedure Evaluation (Addendum)
Anesthesia Evaluation  Patient identified by MRN, date of birth, ID band Patient awake    Reviewed: Allergy & Precautions, H&P , NPO status , Patient's Chart, lab work & pertinent test results  Airway Mallampati: III TM Distance: <3 FB Neck ROM: Limited   Comment: Possible anterior airway with limited ROM of C-spine Dental  (+) Teeth Intact and Dental Advisory Given   Pulmonary neg pulmonary ROS,  clear to auscultation  Pulmonary exam normal       Cardiovascular hypertension, Pt. on medications + CAD (negative myoview for reported chest pain 08/17/10) and neg cardio ROS Regular Normal EKG RBBB.   Neuro/Psych Negative Neurological ROS  Negative Psych ROS   GI/Hepatic negative GI ROS, Neg liver ROS, GERD-  Medicated,  Endo/Other  Negative Endocrine ROSHypothyroidism Hx of thyroid cancer, s/p total thyroidectomy.  Renal/GU negative Renal ROS  Genitourinary negative   Musculoskeletal negative musculoskeletal ROS (+)   Abdominal Normal abdominal exam  (+)   Peds negative pediatric ROS (+)  Hematology negative hematology ROS (+)   Anesthesia Other Findings   Reproductive/Obstetrics negative OB ROS                         Anesthesia Physical Anesthesia Plan  ASA: III  Anesthesia Plan: General   Post-op Pain Management:    Induction: Intravenous  Airway Management Planned: Oral ETT  Additional Equipment:   Intra-op Plan:   Post-operative Plan: Extubation in OR  Informed Consent: I have reviewed the patients History and Physical, chart, labs and discussed the procedure including the risks, benefits and alternatives for the proposed anesthesia with the patient or authorized representative who has indicated his/her understanding and acceptance.   Dental advisory given  Plan Discussed with: CRNA  Anesthesia Plan Comments:         Anesthesia Quick Evaluation

## 2010-12-16 NOTE — H&P (Signed)
Chief Complaint  Microscopic hematuria   History of Present Illness              This is an 77 year old male referred for microscopic hematuria. It was first noticed on September 23, 2010 one urinalysis showed too numerous to count red blood cells. Leukocyte esterase and nitrate were negative at that time. Labs from the same day showed a PSA of 1.335 and serum creatinine of one. The patient returned to his provider to have a repeat urinalysis on October 14, 2010 in which there were no red blood cells present. Serum Cr 1 from 09/23/10 from his PCP's office.  The patient denies any gross hematuria or dysuria. He has a history of smoking for over 35 years. He quit in 1982. 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  He was seen by Lebron Quam, our PA in clinic 10/20/10 where he had a CT abdomen/pelvis hematuria protocol.  There is one large cyst on the right with some possibly smaller cysts, and there are multiple cysts on the left; all of the cysts appear simple.  There were no filling defects in the collecting system, though the right distal ureter is incompletely visualized.  Today we discussed these findings and their implications.  I explained the only way to fully evaluate the right distal ureter was to repeat CT or go to the OR for retrograde pyelogram.  We discussed the risk, benefits, alternatives, and likelihood of achieving his goals with each option.  He was previously seen by Dr. Earlene Plater in this group in 2010 for annual prostate cancer screening and lower urinary tract symptoms.  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 I explained to the patient that we need to biopsy these areas in order to know what they are. I explained that they may be benign or cancerous and that the pathology will tell us what further treatment he may or may not need. We discussed the natural history of these tumors. We discussed the risks,  benefits, alternatives, and likelihood of achieving goals. Today I explained the risks of the procedure which include, but are not limited to, heart attack, stroke, pulmonary embolus, pneumonia, death, positioning injury, permanent or temporary neuropathy, bleeding, infection, need for further procedure, damage to contiguous structures, bladder perforation, need for further treatment, need for bladder repair, need for prolonged catheterization. The patient voiced understanding. Questions were answered to his stated satisfaction.   Past Medical History Problems  1. History of  Hypercholesterolemia 272.0 2. History of  Hypertension 401.9 3. History of  Thyroid Cancer V10.87  Surgical History Problems  1. History of  Thyroid Surgery Total Thyroidectomy  Current Meds 1. Aspir-81 81 MG Oral Tablet Delayed Release; Therapy: (Recorded:12Sep2008) to 2. Lipitor 10 MG Oral Tablet; Therapy: (Recorded:12Sep2008) to 3. Synthroid TABS; Therapy: (Recorded:12Sep2008) to 4. Triamterene-HCTZ 37.5-25 MG Oral Tablet; Therapy: (Recorded:12Sep2008) to  Allergies Medication  1. No Known Drug Allergies  Family History Problems  1. Family history of  Family Health Status Number Of Children 2 Sons & 1 Daughter  Social History Problems    Alcohol Use   Caffeine Use 2   Occupation: Retired  Review of Systems Constitutional, eye, otolaryngeal, cardiovascular, pulmonary, endocrine, musculoskeletal, neurological and psychiatric system(s) were reviewed and pertinent findings if present are noted.  Gastrointestinal: constipation, but no nausea and no vomiting.  Integumentary: skin rash/lesion, but no pruritus.  Hematologic/Lymphatic: a tendency to easily bruise, but  no swollen glands.     Physical Exam Constitutional: Well nourished and well developed.  ENT:. The ears and nose are normal in appearance. The oropharynx is normal.  Neck: The appearance of the neck is normal and no neck mass is present.    Pulmonary: No respiratory distress and normal respiratory rhythm and effort.  Cardiovascular: Heart rate and rhythm are normal . No peripheral edema.  Abdomen: The abdomen is soft and nontender. No masses are palpated. The abdomen is no rebound. No CVA tenderness.  Lymphatics: The posterior cervical and supraclavicular nodes are not enlarged or tender.  Skin: Normal skin turgor and no visible rash.  Neuro/Psych:. Mood and affect are appropriate. No focal sensory deficits.      Assessment  Microscopic Hematuria  Bladder Neoplasm Of Uncertain Behavior   Plan  To OR for cystoscopy, bladder biopsy, transurethral resection of bladder tumor, bilateral retrograde pyelogram, possible bilateral ureteroscopy with biopsy and laser ablation and ureteral stent placement.

## 2010-12-16 NOTE — Brief Op Note (Signed)
12/16/2010  9:55 AM  PATIENT:  Jose Lawson  77 y.o. male  PRE-OPERATIVE DIAGNOSIS:  BLADDER TUMOR AND MICROSCOPIC HEMATURIA  POST-OPERATIVE DIAGNOSIS:  BLADDER TUMOR AND MICROSCOPIC HEMATURIA  PROCEDURE:  Procedure(s): CYSTOSCOPY Bladder Biopsy Right ureteroscopy Bilateral retrograde pyelogram Fluoroscopy SURGEON:  Surgeon(s): Milford Cage, MD  PHYSICIAN ASSISTANT:  none  ASSISTANTS: none   ANESTHESIA:   general  EBL: 5 mL  Total I/O In: 100 [I.V.:100] Out: -   BLOOD ADMINISTERED:none  DRAINS: Urinary Catheter (Foley)   LOCAL MEDICATIONS USED:  OTHER 10 mL lidocaine jelly in urethra  SPECIMEN:  Source of Specimen:  Bladder biopsy on left side (x2)  DISPOSITION OF SPECIMEN:  PATHOLOGY  COUNTS:  YES  TOURNIQUET:  * No tourniquets in log *  DICTATION: .Other Dictation: Dictation Number [id number]  PLAN OF CARE: Discharge to home after PACU  PATIENT DISPOSITION:  PACU - hemodynamically stable.   Delay start of Pharmacological VTE agent (>24hrs) due to surgical blood loss or risk of bleeding:  YES

## 2010-12-17 ENCOUNTER — Encounter (HOSPITAL_COMMUNITY): Payer: Self-pay | Admitting: *Deleted

## 2010-12-17 ENCOUNTER — Emergency Department (HOSPITAL_COMMUNITY)
Admission: EM | Admit: 2010-12-17 | Discharge: 2010-12-17 | Disposition: A | Discharge status: Home / Self Care | Payer: Medicare Other | Attending: Emergency Medicine | Admitting: Emergency Medicine

## 2010-12-17 DIAGNOSIS — Z9889 Other specified postprocedural states: Secondary | ICD-10-CM | POA: Insufficient documentation

## 2010-12-17 DIAGNOSIS — R339 Retention of urine, unspecified: Secondary | ICD-10-CM

## 2010-12-17 DIAGNOSIS — R319 Hematuria, unspecified: Secondary | ICD-10-CM | POA: Insufficient documentation

## 2010-12-17 DIAGNOSIS — I2510 Atherosclerotic heart disease of native coronary artery without angina pectoris: Secondary | ICD-10-CM | POA: Insufficient documentation

## 2010-12-17 DIAGNOSIS — I10 Essential (primary) hypertension: Secondary | ICD-10-CM | POA: Insufficient documentation

## 2010-12-17 LAB — URINALYSIS, ROUTINE W REFLEX MICROSCOPIC
Nitrite: POSITIVE — AB
pH: 5.5 (ref 5.0–8.0)

## 2010-12-17 LAB — URINE MICROSCOPIC-ADD ON

## 2010-12-17 MED ORDER — CIPROFLOXACIN HCL 500 MG PO TABS: 500.0000 mg | ORAL_TABLET | Freq: Two times a day (BID) | ORAL | Status: DC

## 2010-12-17 MED ORDER — CIPROFLOXACIN HCL 500 MG PO TABS: 500.0000 mg | ORAL_TABLET | Freq: Two times a day (BID) | ORAL | Status: AC

## 2010-12-17 MED ORDER — CIPROFLOXACIN HCL 500 MG PO TABS
500.0000 mg | ORAL_TABLET | Freq: Once | ORAL | Status: DC
  Filled 2010-12-17: qty 1

## 2010-12-17 NOTE — ED Notes (Signed)
MD at bedside. 

## 2010-12-17 NOTE — ED Provider Notes (Addendum)
History     CSN: [id number] Arrival date & time: 12/17/2010  9:19 PM   First MD Initiated Contact with Patient 12/17/10 2217      Chief Complaint  Patient presents with  . Urinary Retention   pleasant elderly gentleman that underwent cystoscopy and bladder biopsies yesterday by Dr. Margarita Grizzle. He states he felt he was doing fairly well throughout the evening, but did get up to urinate approximately once every hour. This morning. He was having increasing difficulty urinating and as the afternoon progressed he did call the urology office. He has a PA appointment tomorrow. He is essentially only able to urinate. A few small drops of urine. He has had no fever. No vomiting. No back pain. He does complain of mild scrotal swelling since his surgery.  (Consider location/radiation/quality/duration/timing/severity/associated sxs/prior treatment) HPI  Past Medical History  Diagnosis Date  . Hypertension   . Hx of thyroid cancer     s/p total thyroidectomy,  . Hypothyroidism   . Hyperlipidemia   . 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 Coronary artery disease CARDIOLOGIST- DR Hillis Range    LAST VISIT NOTE 08-13-10 AND STRESS TEST 08-17-10 IN [facility] (LOW RISK ,  NO ISCHEMIA)  . History of basal cell carcinoma excision BILATERAL ARMS  . Arthritis     BACK  . GERD (gastroesophageal reflux disease)     CONTROLLED W/ PROTONIX  . Bladder tumor     CYSTO W/ BX SCHEDULED 12-15-10  . Nocturia     Past Surgical History  Procedure Date  . Total thyroidectomy 1991  . Cataract extraction w/ intraocular lens  implant, bilateral     Family History  Problem Relation Age of Onset  . Lung cancer Brother   . Thyroid cancer    . Diabetes Father     History  Substance Use Topics  . Smoking status: Former Smoker    Quit date: 05/12/1981  . Smokeless tobacco: Never Used   Comment: quit 1983  . Alcohol Use: 0.0 oz/week     6-8 oz vodka per day      Review  of Systems  All other systems reviewed and are negative.    Allergies  Review of patient's allergies indicates no known allergies.  Home Medications   Current Outpatient Rx  Name Route Sig Dispense Refill  . ASPIRIN 81 MG PO TABS Oral Take 1 tablet (81 mg total) by mouth daily. 1 tablet 0  . ATORVASTATIN CALCIUM 20 MG PO TABS Oral Take 1 tablet by mouth every morning.     . CEPHALEXIN 500 MG PO CAPS Oral Take 1 capsule (500 mg total) by mouth 3 (three) times daily. 9 capsule 0  . HYDROCODONE-ACETAMINOPHEN 5-325 MG PO TABS Oral Take 1-2 tablets by mouth every 4 (four) hours as needed for pain. 25 tablet 0  . CENTRUM PO TABS Oral Take 1 tablet by mouth daily.     [name] PANTOPRAZOLE SODIUM 40 MG PO TBEC Oral Take 40 mg by mouth every morning.      [name] PHENAZOPYRIDINE HCL 100 MG PO TABS Oral Take 1 tablet (100 mg total) by mouth every 8 (eight) hours as needed for pain (Burning urination.  Will turn urine and body fluids orange.). 30 tablet 1  . SYNTHROID 150 MCG PO TABS Oral Take 1 tablet by mouth every morning.     . TRIAMTERENE-HCTZ 37.5-25 MG PO CAPS Oral Take 1 tablet by mouth every morning.     [name]  NITROGLYCERIN 0.4 MG SL SUBL Sublingual Place 1 tablet (0.4 mg total) under the tongue every 5 (five) minutes as needed for chest pain. 25 tablet 12    BP 188/75  Pulse 85  Temp(Src) 99 F (37.2 C) (Oral)  Resp 18  SpO2 95%  Physical Exam  Nursing note and vitals reviewed. Constitutional: He is oriented to person, place, and time. He appears well-developed and well-nourished.  HENT:  Head: Normocephalic and atraumatic.  Eyes: Conjunctivae and EOM are normal. Pupils are equal, round, and reactive to light.  Neck: Neck supple.  Cardiovascular: Normal rate and regular rhythm.  Exam reveals no gallop and no friction rub.   No murmur heard. Pulmonary/Chest: Breath sounds normal. He has no wheezes. He has no rales. He exhibits no tenderness.  Abdominal: Soft. Bowel sounds are normal. He  exhibits no distension. There is no tenderness. There is no rebound and no guarding.  Genitourinary: Penis normal.       Bladder feels distended and tender to palpation. Penis appears to be normal. Scrotum shows mild redness and swelling. No testicular tenderness  Musculoskeletal: Normal range of motion.  Neurological: He is alert and oriented to person, place, and time. No cranial nerve deficit. Coordination normal.  Skin: Skin is warm and dry. No rash noted.  Psychiatric: He has a normal mood and affect.    ED Course  Procedures (including critical care time)  Labs Reviewed - No data to display No results found.   No diagnosis found.    MDM  Pt is seen and examined;  Initial history and physical completed.  Will follow.          Sabatino Williard A. Patrica Duel, MD 12/17/10 2229  10:36 PM  Foley placed with clear flow of urine.  Toryn Mcclinton A. Patrica Duel, MD 12/17/10 2236   11:22 PM Discuss with urology on call, Dr. Vernie Ammons. Also agrees with continued outpatient followup and also recommends Cipro oral tablets  Jaesean Litzau A. Patrica Duel, MD 12/17/10 2322

## 2010-12-17 NOTE — ED Notes (Signed)
Pt with leg bag on. An instructed on usuage with proper return demo. Pt and wife given education.

## 2010-12-17 NOTE — ED Notes (Signed)
Pt in c/o urinary retention and penile swelling and fever, pt is able to urinate some and does not feel full at this time but is not able to empty his bladder. Pt states symptoms started last night.

## 2010-12-19 LAB — URINE CULTURE
Culture  Setup Time: 201212070429
Culture: NO GROWTH

## 2010-12-28 ENCOUNTER — Encounter (HOSPITAL_BASED_OUTPATIENT_CLINIC_OR_DEPARTMENT_OTHER): Payer: Self-pay | Admitting: Urology

## 2011-02-04 ENCOUNTER — Other Ambulatory Visit: Payer: Self-pay | Admitting: Dermatology

## 2011-04-26 ENCOUNTER — Other Ambulatory Visit: Payer: Self-pay | Admitting: Dermatology

## 2011-05-14 ENCOUNTER — Emergency Department (HOSPITAL_COMMUNITY)
Admission: EM | Admit: 2011-05-14 | Discharge: 2011-05-14 | Disposition: A | Discharge status: Home / Self Care | Payer: Medicare Other | Attending: Emergency Medicine | Admitting: Emergency Medicine

## 2011-05-14 ENCOUNTER — Encounter (HOSPITAL_COMMUNITY): Payer: Self-pay

## 2011-05-14 DIAGNOSIS — I10 Essential (primary) hypertension: Secondary | ICD-10-CM | POA: Insufficient documentation

## 2011-05-14 DIAGNOSIS — N390 Urinary tract infection, site not specified: Secondary | ICD-10-CM

## 2011-05-14 DIAGNOSIS — E039 Hypothyroidism, unspecified: Secondary | ICD-10-CM | POA: Insufficient documentation

## 2011-05-14 DIAGNOSIS — E785 Hyperlipidemia, unspecified: Secondary | ICD-10-CM | POA: Insufficient documentation

## 2011-05-14 DIAGNOSIS — R310 Gross hematuria: Secondary | ICD-10-CM | POA: Insufficient documentation

## 2011-05-14 LAB — DIFFERENTIAL
Basophils Absolute: 0 10*3/uL (ref 0.0–0.1)
Lymphocytes Relative: 2 % — ABNORMAL LOW (ref 12–46)
Monocytes Absolute: 0.6 10*3/uL (ref 0.1–1.0)
Neutro Abs: 14.2 10*3/uL — ABNORMAL HIGH (ref 1.7–7.7)

## 2011-05-14 LAB — CBC
HCT: 40.2 % (ref 39.0–52.0)
Hemoglobin: 13 g/dL (ref 13.0–17.0)
RBC: 4.63 MIL/uL (ref 4.22–5.81)
RDW: 13.8 % (ref 11.5–15.5)
WBC: 15.1 10*3/uL — ABNORMAL HIGH (ref 4.0–10.5)

## 2011-05-14 LAB — POCT I-STAT, CHEM 8
Creatinine, Ser: 1.1 mg/dL (ref 0.50–1.35)
Glucose, Bld: 120 mg/dL — ABNORMAL HIGH (ref 70–99)
Hemoglobin: 14.3 g/dL (ref 13.0–17.0)
Sodium: 139 mEq/L (ref 135–145)
TCO2: 28 mmol/L (ref 0–100)

## 2011-05-14 LAB — URINALYSIS, ROUTINE W REFLEX MICROSCOPIC
Glucose, UA: NEGATIVE mg/dL
Ketones, ur: 15 mg/dL — AB
Specific Gravity, Urine: 1.019 (ref 1.005–1.030)
pH: 6 (ref 5.0–8.0)

## 2011-05-14 LAB — URINE MICROSCOPIC-ADD ON

## 2011-05-14 MED ORDER — ACETAMINOPHEN 160 MG/5ML PO SOLN: 650.0000 mg | Freq: Once | ORAL | Status: DC

## 2011-05-14 MED ORDER — CIPROFLOXACIN IN D5W 400 MG/200ML IV SOLN
400.0000 mg | Freq: Once | INTRAVENOUS | Status: AC
  Administered 2011-05-14: 400 mg via INTRAVENOUS
  Filled 2011-05-14: qty 200

## 2011-05-14 MED ORDER — ACETAMINOPHEN 325 MG PO TABS
650.0000 mg | ORAL_TABLET | Freq: Once | ORAL | Status: AC
  Administered 2011-05-14: 650 mg via ORAL
  Filled 2011-05-14: qty 2

## 2011-05-14 MED ORDER — ACETAMINOPHEN 325 MG PO TABS
ORAL_TABLET | ORAL | Status: AC
  Administered 2011-05-14: 650 mg
  Filled 2011-05-14: qty 2

## 2011-05-14 NOTE — ED Provider Notes (Signed)
Patient had bladder irrigation earlier today developed hematuria and fever accompanied by shaking chills presently feels well no distress. Patient alert not acutely ill appearing. Dr Cassell Smiles came to evaluate patient in the emergency department and will make final disposition   Doug Sou, MD 05/14/11 2318

## 2011-05-14 NOTE — ED Notes (Addendum)
Pt also reports low grade fever, chills.  Pt reports he had ua before the treatement today that was clean.

## 2011-05-14 NOTE — ED Notes (Signed)
Patient reports that he had a bladder washing for bladder cancer this AM and noted that he had blood in his urine this afternoon. Patient is also c/o urinary retention and urgency.

## 2011-05-14 NOTE — ED Provider Notes (Signed)
History     CSN: [id number]  Arrival date & time 05/14/11  1711   First MD Initiated Contact with Patient 05/14/11 1957      Chief Complaint  Patient presents with  . Hematuria    (Consider location/radiation/quality/duration/timing/severity/associated sxs/prior treatment) HPI Comments: Patient had a bladder instillation today in the urologist office for bladder cancer.  This is his fourth treatment of 6.  He was initially fine until this afternoon, when he noted blood in his urine.  He then developed chills, frequency, and a fever.  Patient is a 78 y.o. male presenting with hematuria. The history is provided by the patient.  Hematuria This is a new problem. The current episode started today. The problem is unchanged. He describes the hematuria as gross hematuria. The hematuria occurs throughout his entire urinary stream. He reports no clotting in his urine stream. His pain is at a severity of 0/10. He is experiencing no pain. He describes his urine color as tea colored. Irritative symptoms include frequency. Associated symptoms include chills and fever. Pertinent negatives include no dysuria.    Past Medical History  Diagnosis Date  . Hypertension   . Hx of thyroid cancer     s/p total thyroidectomy,  . Hypothyroidism   . Hyperlipidemia   . 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 Coronary artery disease CARDIOLOGIST- DR Hillis Range    LAST VISIT NOTE 08-13-10 AND STRESS TEST 08-17-10 IN EPIC (LOW RISK ,  NO ISCHEMIA)  . History of basal cell carcinoma excision BILATERAL ARMS  . Arthritis     BACK  . GERD (gastroesophageal reflux disease)     CONTROLLED W/ PROTONIX  . Bladder tumor     CYSTO W/ BX SCHEDULED 12-15-10  . Nocturia     Past Surgical History  Procedure Date  . Total thyroidectomy 1991  . Cataract extraction w/ intraocular lens  implant, bilateral   . Cystoscopy with biopsy 12/16/2010    Procedure: CYSTOSCOPY WITH BIOPSY;   Surgeon: Milford Cage, MD;  Location: [facility];  Service: Urology;  Laterality: N/A;  . Transurethral resection of bladder tumor 12/16/2010    Procedure: TRANSURETHRAL RESECTION OF BLADDER TUMOR (TURBT);  Surgeon: Milford Cage, MD;  Location: [facility];  Service: Urology;  Laterality: N/A;  . Cystoscopy/retrograde/ureteroscopy 12/16/2010    Procedure: CYSTOSCOPY/RETROGRADE/URETEROSCOPY;  Surgeon: Milford Cage, MD;  Location: [facility];  Service: Urology;  Laterality: Right;  bilateral retrograde, right ureteroscopy    Family History  Problem Relation Age of Onset  . Lung cancer Brother   . Thyroid cancer    . Diabetes Father     History  Substance Use Topics  . Smoking status: Former Smoker    Quit date: 05/12/1981  . Smokeless tobacco: Never Used   Comment: quit 1983  . Alcohol Use: 0.0 oz/week     6-8 oz vodka per day      Review of Systems  Constitutional: Positive for fever and chills.  Genitourinary: Positive for frequency, hematuria and decreased urine volume. Negative for dysuria.    Allergies  Review of patient's allergies indicates no known allergies.  Home Medications   Current Outpatient Rx  Name Route Sig Dispense Refill  . ASPIRIN 81 MG PO TABS Oral Take 1 tablet (81 mg total) by mouth daily. 1 tablet 0  . ATORVASTATIN CALCIUM 20 MG PO TABS Oral Take 1 tablet by mouth every morning.     [name]  FINASTERIDE 5 MG PO TABS Oral Take 5 mg by mouth daily.    [name] PANTOPRAZOLE SODIUM 40 MG PO TBEC Oral Take 40 mg by mouth every morning.      [name] SILODOSIN 4 MG PO CAPS Oral Take 4 mg by mouth daily with breakfast.    . SYNTHROID 150 MCG PO TABS Oral Take 1 tablet by mouth every morning.     . TRIAMTERENE-HCTZ 37.5-25 MG PO CAPS Oral Take 1 tablet by mouth every morning.       BP 149/63  Pulse 69  Temp(Src) 102.1 F (38.9 C) (Oral)  SpO2 92%  Physical Exam  Constitutional: He appears  well-developed and well-nourished.  HENT:  Head: Normocephalic.  Eyes: Pupils are equal, round, and reactive to light.  Neck: Normal range of motion.  Cardiovascular: Normal rate.   Pulmonary/Chest: Effort normal.  Abdominal: Soft. He exhibits no distension. There is no tenderness.  Musculoskeletal: Normal range of motion.  Neurological: He is alert.  Skin: Skin is warm.    ED Course  Procedures (including critical care time)  Labs Reviewed  URINALYSIS, ROUTINE W REFLEX MICROSCOPIC - Abnormal; Notable for the following:    Color, Urine RED (*) BIOCHEMICALS MAY BE AFFECTED BY COLOR   APPearance TURBID (*)    Hgb urine dipstick LARGE (*)    Ketones, ur 15 (*)    Protein, ur >300 (*)    Leukocytes, UA LARGE (*)    All other components within normal limits  CBC - Abnormal; Notable for the following:    WBC 15.1 (*)    All other components within normal limits  DIFFERENTIAL - Abnormal; Notable for the following:    Neutrophils Relative 94 (*)    Neutro Abs 14.2 (*)    Lymphocytes Relative 2 (*)    Lymphs Abs 0.3 (*)    All other components within normal limits  POCT I-STAT, CHEM 8 - Abnormal; Notable for the following:    Glucose, Bld 120 (*)    Calcium, Ion 1.11 (*)    All other components within normal limits  URINE MICROSCOPIC-ADD ON  URINE CULTURE   No results found.   No diagnosis found.    MDM  Hematuria, and frequency.  Following a bladder installation at the urologist office today.  I spoke with Dr. Patsi Sears,  reviewing the urine results which indicates a UTI, as well as his slightly elevated white count of 15.2.  The fact that I started him on IV Cipro and fluids.  He is tolerating PO's.  Dr. Patsi Sears feels that it is fine for him to go home.  Continue by mouth Cipro as prescribed and followup with his urologist by phone on Monday` Dr.Tannenbaum was in the department as saw him prior to discharge        Arman Filter, NP 05/14/11 2317  Arman Filter, NP 05/14/11 2318

## 2011-05-14 NOTE — Discharge Instructions (Signed)
Hematuria, Adult  Hematuria (blood in your urine) can be caused by a bladder infection (cystitis), kidney infection (pyelonephritis), prostate infection (prostatitis), or kidney stone. Infections will usually respond to antibiotics (medications which kill germs), and a kidney stone will usually pass through your urine without further treatment. If you were put on antibiotics, take all the medicine until gone. You may feel better in a few days, but take all of your medicine or the infection may not respond and become more difficult to treat. If antibiotics were not given, an infection did not cause the blood in the urine. A further work up to find out the reason may be needed.  HOME CARE INSTRUCTIONS    Drink lots of fluid, 3 to 4 quarts a day. If you have been diagnosed with an infection, cranberry juice is especially recommended, in addition to large amounts of water.   Avoid caffeine, tea, and carbonated beverages, because they tend to irritate the bladder.   Avoid alcohol as it may irritate the prostate.   Only take over-the-counter or prescription medicines for pain, discomfort, or fever as directed by your caregiver.   If you have been diagnosed with a kidney stone follow your caregivers instructions regarding straining your urine to catch the stone.  TO PREVENT FURTHER INFECTIONS:   Empty the bladder often. Avoid holding urine for long periods of time.   After a bowel movement, women should cleanse front to back. Use each tissue only once.   Empty the bladder before and after sexual intercourse if you are a male.   Return to your caregiver if you develop back pain, fever, nausea (feeling sick to your stomach), vomiting, or your symptoms (problems) are not better in 3 days. Return sooner if you are getting worse.  If you have been requested to return for further testing make sure to keep your appointments. If an infection is not the cause of blood in your urine, X-rays may be required. Your caregiver  will discuss this with you.  SEEK IMMEDIATE MEDICAL CARE IF:    You have a persistent fever over 102 F (38.9 C).   You develop severe vomiting and are unable to keep the medication down.   You develop severe back or abdominal pain despite taking your medications.   You begin passing a large amount of blood or clots in your urine.   You feel extremely weak or faint, or pass out.  MAKE SURE YOU:    Understand these instructions.   Will watch your condition.   Will get help right away if you are not doing well or get worse.  Document Released: 12/28/2004 Document Revised: 12/17/2010 Document Reviewed: 08/17/2007  [employer] Patient Information 2012 [employer]  Urinary Tract Infection  Infections of the urinary tract can start in several places. A bladder infection (cystitis), a kidney infection (pyelonephritis), and a prostate infection (prostatitis) are different types of urinary tract infections (UTIs). They usually get better if treated with medicines (antibiotics) that kill germs. Take all the medicine until it is gone. You or your child may feel better in a few days, but TAKE ALL MEDICINE or the infection may not respond and may become more difficult to treat.  HOME CARE INSTRUCTIONS    Drink enough water and fluids to keep the urine clear or pale yellow. Cranberry juice is especially recommended, in addition to large amounts of water.   Avoid caffeine, tea, and carbonated beverages. They tend to irritate the bladder.   Alcohol may irritate   or fever as directed by your caregiver.  To prevent further infections:  Empty the bladder often. Avoid holding urine for long periods of time.   After a bowel movement, women should cleanse from front to back. Use each tissue only once.   Empty the bladder before and after sexual intercourse.  FINDING OUT THE RESULTS OF YOUR TEST Not all test  results are available during your visit. If your or your child's test results are not back during the visit, make an appointment with your caregiver to find out the results. Do not assume everything is normal if you have not heard from your caregiver or the medical facility. It is important for you to follow up on all test results. SEEK MEDICAL CARE IF:   There is back pain.   Your baby is older than 3 months with a rectal temperature of 100.5 F (38.1 C) or higher for more than 1 day.   Your or your child's problems (symptoms) are no better in 3 days. Return sooner if you or your child is getting worse.  SEEK IMMEDIATE MEDICAL CARE IF:   There is severe back pain or lower abdominal pain.   You or your child develops chills.   You have a fever.   Your baby is older than 3 months with a rectal temperature of 102 F (38.9 C) or higher.   Your baby is 46 months old or younger with a rectal temperature of 100.4 F (38 C) or higher.   There is nausea or vomiting.   There is continued burning or discomfort with urination.  MAKE SURE YOU:   Understand these instructions.   Will watch your condition.   Will get help right away if you are not doing well or get worse.  Document Released: 10/07/2004 Document Revised: 12/17/2010 Document Reviewed: 05/12/2006 [facility] Patient Information 2012 [place], [place]. As discussed pick up the prescription that was called in for you to start tomorrow morning.  Call your urologist first thing Monday morning as a followup if at anytime over the weekend, you develop worsening symptoms please return to the emergency department for further treatment

## 2011-05-15 NOTE — ED Provider Notes (Signed)
Medical screening examination/treatment/procedure(s) were conducted as a shared visit with non-physician practitioner(s) and myself.  I personally evaluated the patient during the encounter  Doug Sou, MD 05/15/11 0030

## 2011-05-16 LAB — URINE CULTURE
Colony Count: NO GROWTH
Culture  Setup Time: 201305040214

## 2011-08-02 ENCOUNTER — Other Ambulatory Visit: Payer: Self-pay | Admitting: Dermatology

## 2011-10-08 ENCOUNTER — Other Ambulatory Visit: Payer: Self-pay | Admitting: Internal Medicine

## 2011-10-08 MED ORDER — PANTOPRAZOLE SODIUM 40 MG PO TBEC: 40.0000 mg | DELAYED_RELEASE_TABLET | ORAL | Status: DC

## 2011-10-12 ENCOUNTER — Other Ambulatory Visit: Payer: Self-pay | Admitting: Urology

## 2011-10-25 ENCOUNTER — Encounter (HOSPITAL_COMMUNITY): Payer: Self-pay | Admitting: Pharmacy Technician

## 2011-10-27 NOTE — Patient Instructions (Addendum)
22 Jose S Mcelhaney Sr.  10/27/2011   Your procedure is scheduled on:  11/02/11 1000am-1130am  Report to [facility] Stay [facility] at 0730 AM.  Call this number if you have problems the morning of surgery: [phone]   Remember:   Do not eat food:After Midnight.  May have clear liquids:until Midnight .    Take these medicines the morning of surgery with A SIP OF WATER:   Do not wear jewelr  Do not wear lotions, powders, or perfumes. .  . Men may shave face and neck.  Do not bring valuables to the hospital.  Contacts, dentures or bridgework may not be worn into surgery.      Patients discharged the day of surgery will not be allowed to drive home.   The name of your driver will be:                  SEE CHG INSTRUCTION SHEET    Please read over the following fact sheets that you were given: MRSA Information, coughing and deep breathing exercises, leg exercises, Blood Transfusion Fact Sheet

## 2011-10-28 ENCOUNTER — Encounter (HOSPITAL_COMMUNITY): Payer: Self-pay

## 2011-10-28 ENCOUNTER — Encounter (HOSPITAL_COMMUNITY)
Admission: RE | Admit: 2011-10-28 | Discharge: 2011-10-28 | Disposition: A | Discharge status: Home / Self Care | Payer: Medicare Other | Source: Ambulatory Visit | Attending: Urology | Admitting: Urology

## 2011-10-28 ENCOUNTER — Ambulatory Visit (HOSPITAL_COMMUNITY)
Admission: RE | Admit: 2011-10-28 | Discharge: 2011-10-28 | Disposition: A | Discharge status: Home / Self Care | Payer: Medicare Other | Source: Ambulatory Visit | Attending: Urology | Admitting: Urology

## 2011-10-28 DIAGNOSIS — I4510 Unspecified right bundle-branch block: Secondary | ICD-10-CM | POA: Insufficient documentation

## 2011-10-28 DIAGNOSIS — I440 Atrioventricular block, first degree: Secondary | ICD-10-CM | POA: Insufficient documentation

## 2011-10-28 DIAGNOSIS — Z01812 Encounter for preprocedural laboratory examination: Secondary | ICD-10-CM | POA: Insufficient documentation

## 2011-10-28 DIAGNOSIS — R9431 Abnormal electrocardiogram [ECG] [EKG]: Secondary | ICD-10-CM | POA: Insufficient documentation

## 2011-10-28 DIAGNOSIS — Z01810 Encounter for preprocedural cardiovascular examination: Secondary | ICD-10-CM | POA: Insufficient documentation

## 2011-10-28 DIAGNOSIS — I10 Essential (primary) hypertension: Secondary | ICD-10-CM | POA: Insufficient documentation

## 2011-10-28 DIAGNOSIS — Z01818 Encounter for other preprocedural examination: Secondary | ICD-10-CM | POA: Insufficient documentation

## 2011-10-28 LAB — CBC
MCH: 27.1 pg (ref 26.0–34.0)
MCHC: 31.8 g/dL (ref 30.0–36.0)
Platelets: 324 10*3/uL (ref 150–400)
RBC: 4.54 MIL/uL (ref 4.22–5.81)

## 2011-10-28 LAB — SURGICAL PCR SCREEN
MRSA, PCR: NEGATIVE
Staphylococcus aureus: NEGATIVE

## 2011-10-28 LAB — BASIC METABOLIC PANEL
Calcium: 9.6 mg/dL (ref 8.4–10.5)
GFR calc Af Amer: 76 mL/min — ABNORMAL LOW (ref >90)
GFR calc non Af Amer: 65 mL/min — ABNORMAL LOW (ref >90)
Sodium: 137 mEq/L (ref 135–145)

## 2011-10-28 NOTE — Progress Notes (Signed)
08/26/10 Last office visit with Dr Johney Frame in [facility]  Myoview- 8/12 in EPIC

## 2011-11-01 NOTE — H&P (Signed)
History of Present Illness                   Followup bladder cancer, BPH and lower urinary tract symptoms. Referred by Dr. Margarita Grizzle. I  He was diagnosed with LG Ta + CIS bladder cancer and underwent BCG. This was complicated with enterococcus infection which caused [place] to hold the fourth dose. After this was cleared he developed a BCG reaction following dose #4. He has had severe urinary frequency and urgency since that time. He does not empty his bladder well due to large prostate. In fact his BCG treatments required [place] to leave a catheter indwelling to ensure that he is bladder emptied the BCG well.  He was seen May 8 when he had a catheter placed for LUTS and a PVR of 111 ml.   Prostate [place] = 60 g prostate  He has been on rapaflo and finasteride. A recent AUA symptom score was 20 with a quality of life 4. I asked him if this has worsened with the BCG over the past few months and he said he had many of these symptoms prior to BCG. He voids with a weak stream and has bothersome urgency. He has occasional frequency. He has nocturia x2-4. He's had no recent dysuria or gross hematuria. A PSA was 1.14 September 2010. His creatinine was one. Cystoscopy showed obstructing lateral lobes and the median lobe. Prior uroflow of showed he only voided 40 mL.   Cystoscopy showed obstructing lateral lobe hypertrophy May 2013. Uroflow - voided 313 ml, qmax 12 cc/sec, avg q 7 ml/sec , curve flat, intermittent   UDS - June 2013 which revealed a small capacity unstable bladder(bladder capacity 130 mL, unstable contractions no leakage), he had no stress incontinence, he voided with an equivocal pattern with a detrusor pressure of 47 cm of water and a qmax of 11 cm/s. He had moderate left ureteral reflux with bladder trabeculation and diverticuli.  Interval Hx He continues Rapaflo and Avodart. He would like to proceed with Greenlight PVP. He has been well and no dysuria or hematuria.  He just saw Dr. Jarome Matin  this AM for his yearly and there were no issues.    Past Medical History Problems  1. History of  Hypercholesterolemia 272.0 2. History of  Hypertension 401.9 3. History of  Thyroid Cancer V10.87  Surgical History Problems  1. History of  Cystoscopy With Biopsy 2. History of  Cystoscopy With Ureteroscopy Right 3. History of  Thyroid Surgery Total Thyroidectomy  Current Meds 1. Aspir-81 81 MG Oral Tablet Delayed Release; Therapy: (Recorded:12Sep2008) to 2. Atorvastatin Calcium 20 MG Oral Tablet; Therapy: 19Sep2012 to 3. Avodart 0.5 MG Oral Capsule; TAKE 1 CAPSULE Daily; Therapy: 26Aug2013 to  (Evaluate:24Mar2014)  Requested for: 26Aug2013; Last Rx:26Aug2013 4. Levothyroxine Sodium 150 MCG Oral Tablet; Therapy: 06Mar2013 to 5. Nitrostat 0.4 MG Sublingual Tablet Sublingual; PLACE 1 TABLET UNDER THE TONGUE EVERY  5 MINUTES AS NEEDED FOR CHEST PA; Therapy: 02Aug2012 to 6. Pantoprazole Sodium 40 MG Oral Tablet Delayed Release; Therapy: 15Aug2012 to 7. Rapaflo 8 MG Oral Capsule; TAKE 1 CAPSULE DAILY WITH FOOD; Therapy: 13Dec2012 to  (Evaluate:12Jun2014)  Requested for: 17Jun2013; Last Rx:17Jun2013 8. Triamterene-HCTZ 37.5-25 MG Oral Capsule; Therapy: 19Sep2012 to 9. Vitamin B-12 TABS; Therapy: (Recorded:15Jul2013) to  Allergies Medication  1. No Known Drug Allergies  Family History Problems  1. Family history of  Family Health Status Number Of Children 2 Sons & 1 Daughter  Social History Problems  1. Alcohol Use  2. Caffeine Use 2 3. Former Smoker V15.82 quit 1982 4. Occupation: Retired  Review of Systems Cardiovascular, pulmonary, gastrointestinal and neurological system(s) were reviewed and pertinent findings if present are noted.    Vitals Vital Signs [Data Includes: Last 1 Day]  30Sep2013 02:37PM  Blood Pressure: 169 / 67 Heart Rate: 76  Physical Exam Constitutional: Well nourished and well developed . No acute distress.  Pulmonary: No respiratory distress and  normal respiratory rhythm and effort.  Cardiovascular: Heart rate and rhythm are normal . No peripheral edema.  Neuro/Psych:. Mood and affect are appropriate.    Assessment Assessed  1. Acute Urinary Retention 788.20 2. Benign Prostatic Hypertrophy With Urinary Obstruction 600.01  Plan Benign Prostatic Hypertrophy With Urinary Obstruction (600.01), Acute Urinary Retention (788.20)  1. Follow-up Schedule Surgery Office  Follow-up  Done: 30Sep2013  Discussion/Summary  I went over again the nature risks benefits and alternatives to greenlight vaporization of the prostate. We discussed again and the expected result in terms of flow symptoms and irritative voiding symptoms such as frequency and urgency.  We also discussed the likelihood of achieving the goals of the procedure and potential problems that might occur during the procedure or recuperation. All questions answered. The patient elects to proceed with greenlight laser photo vaporization of the prostate.   Reviewing his chart he is due for cystoscopy which will be done as part of the greenlight therefore I'll let Dr. Margarita Grizzle our plans. We could also proceed with a bladder biopsy that day if needed.    Signatures Electronically signed by : Jerilee Field, M.D.; Oct 11 2011  3:05PM

## 2011-11-02 ENCOUNTER — Encounter (HOSPITAL_COMMUNITY): Payer: Self-pay | Admitting: Anesthesiology

## 2011-11-02 ENCOUNTER — Encounter (HOSPITAL_COMMUNITY)
Admission: RE | Disposition: A | Discharge status: Home / Self Care | Payer: Self-pay | Source: Ambulatory Visit | Attending: Urology

## 2011-11-02 ENCOUNTER — Ambulatory Visit (HOSPITAL_COMMUNITY)
Admission: RE | Admit: 2011-11-02 | Discharge: 2011-11-02 | Disposition: A | Discharge status: Home / Self Care | Payer: Medicare Other | Source: Ambulatory Visit | Attending: Urology | Admitting: Urology

## 2011-11-02 ENCOUNTER — Encounter (HOSPITAL_COMMUNITY): Payer: Self-pay | Admitting: *Deleted

## 2011-11-02 ENCOUNTER — Ambulatory Visit (HOSPITAL_COMMUNITY): Payer: Medicare Other | Admitting: Anesthesiology

## 2011-11-02 DIAGNOSIS — N401 Enlarged prostate with lower urinary tract symptoms: Secondary | ICD-10-CM | POA: Insufficient documentation

## 2011-11-02 DIAGNOSIS — Z85850 Personal history of malignant neoplasm of thyroid: Secondary | ICD-10-CM | POA: Insufficient documentation

## 2011-11-02 DIAGNOSIS — N320 Bladder-neck obstruction: Secondary | ICD-10-CM | POA: Insufficient documentation

## 2011-11-02 DIAGNOSIS — I10 Essential (primary) hypertension: Secondary | ICD-10-CM | POA: Insufficient documentation

## 2011-11-02 DIAGNOSIS — Z79899 Other long term (current) drug therapy: Secondary | ICD-10-CM | POA: Insufficient documentation

## 2011-11-02 DIAGNOSIS — Z8551 Personal history of malignant neoplasm of bladder: Secondary | ICD-10-CM | POA: Insufficient documentation

## 2011-11-02 DIAGNOSIS — I2510 Atherosclerotic heart disease of native coronary artery without angina pectoris: Secondary | ICD-10-CM | POA: Insufficient documentation

## 2011-11-02 DIAGNOSIS — N138 Other obstructive and reflux uropathy: Secondary | ICD-10-CM | POA: Insufficient documentation

## 2011-11-02 DIAGNOSIS — K219 Gastro-esophageal reflux disease without esophagitis: Secondary | ICD-10-CM | POA: Insufficient documentation

## 2011-11-02 DIAGNOSIS — R339 Retention of urine, unspecified: Secondary | ICD-10-CM | POA: Insufficient documentation

## 2011-11-02 DIAGNOSIS — E039 Hypothyroidism, unspecified: Secondary | ICD-10-CM | POA: Insufficient documentation

## 2011-11-02 DIAGNOSIS — E7800 Pure hypercholesterolemia, unspecified: Secondary | ICD-10-CM | POA: Insufficient documentation

## 2011-11-02 HISTORY — PX: GREEN LIGHT LASER TURP (TRANSURETHRAL RESECTION OF PROSTATE: SHX6260

## 2011-11-02 HISTORY — DX: Adverse effect of unspecified anesthetic, initial encounter: T41.45XA

## 2011-11-02 SURGERY — GREEN LIGHT LASER TURP (TRANSURETHRAL RESECTION OF PROSTATE
Anesthesia: General | Site: Prostate | Wound class: Clean Contaminated

## 2011-11-02 MED ORDER — FENTANYL CITRATE 0.05 MG/ML IJ SOLN: 25.0000 ug | INTRAMUSCULAR | Status: DC | PRN

## 2011-11-02 MED ORDER — BELLADONNA ALKALOIDS-OPIUM 16.2-60 MG RE SUPP
RECTAL | Status: DC | PRN
  Administered 2011-11-02: 1 via RECTAL

## 2011-11-02 MED ORDER — EPHEDRINE SULFATE 50 MG/ML IJ SOLN
INTRAMUSCULAR | Status: DC | PRN
  Administered 2011-11-02 (×2): 10 mg via INTRAVENOUS

## 2011-11-02 MED ORDER — ACETAMINOPHEN 10 MG/ML IV SOLN
INTRAVENOUS | Status: DC | PRN
  Administered 2011-11-02: 1000 mg via INTRAVENOUS

## 2011-11-02 MED ORDER — PROPOFOL 10 MG/ML IV BOLUS
INTRAVENOUS | Status: DC | PRN
  Administered 2011-11-02: 150 mg via INTRAVENOUS

## 2011-11-02 MED ORDER — LACTATED RINGERS IV SOLN
INTRAVENOUS | Status: DC | PRN
  Administered 2011-11-02: 09:00:00 via INTRAVENOUS

## 2011-11-02 MED ORDER — BELLADONNA ALKALOIDS-OPIUM 16.2-60 MG RE SUPP
RECTAL | Status: AC
  Filled 2011-11-02: qty 1

## 2011-11-02 MED ORDER — SODIUM CHLORIDE 0.9 % IR SOLN
Status: DC | PRN
  Administered 2011-11-02 (×4): 3000 mL via INTRAVESICAL

## 2011-11-02 MED ORDER — CEFAZOLIN SODIUM-DEXTROSE 2-3 GM-% IV SOLR
2.0000 g | INTRAVENOUS | Status: AC
  Administered 2011-11-02: 2 g via INTRAVENOUS

## 2011-11-02 MED ORDER — ACETAMINOPHEN 10 MG/ML IV SOLN
INTRAVENOUS | Status: AC
  Filled 2011-11-02: qty 100

## 2011-11-02 MED ORDER — METOCLOPRAMIDE HCL 5 MG/ML IJ SOLN
INTRAMUSCULAR | Status: DC | PRN
  Administered 2011-11-02: 10 mg via INTRAVENOUS

## 2011-11-02 MED ORDER — GLYCOPYRROLATE 0.2 MG/ML IJ SOLN
INTRAMUSCULAR | Status: DC | PRN
  Administered 2011-11-02 (×2): 0.1 mg via INTRAVENOUS

## 2011-11-02 MED ORDER — KETAMINE HCL 10 MG/ML IJ SOLN
INTRAMUSCULAR | Status: DC | PRN
  Administered 2011-11-02 (×2): 1 mg via INTRAVENOUS
  Administered 2011-11-02: 25 mg via INTRAVENOUS
  Administered 2011-11-02 (×2): 1 mg via INTRAVENOUS

## 2011-11-02 MED ORDER — CIPROFLOXACIN HCL 500 MG PO TABS: 500.0000 mg | ORAL_TABLET | Freq: Every day | ORAL | Status: DC

## 2011-11-02 MED ORDER — ONDANSETRON HCL 4 MG/2ML IJ SOLN
INTRAMUSCULAR | Status: DC | PRN
  Administered 2011-11-02 (×2): 4 mg via INTRAVENOUS

## 2011-11-02 MED ORDER — LIDOCAINE HCL 2 % EX GEL
CUTANEOUS | Status: AC
  Filled 2011-11-02: qty 10

## 2011-11-02 MED ORDER — PROMETHAZINE HCL 25 MG/ML IJ SOLN: 6.2500 mg | INTRAMUSCULAR | Status: DC | PRN

## 2011-11-02 MED ORDER — CEFAZOLIN SODIUM-DEXTROSE 2-3 GM-% IV SOLR
INTRAVENOUS | Status: AC
  Filled 2011-11-02: qty 50

## 2011-11-02 MED ORDER — PHENYLEPHRINE HCL 10 MG/ML IJ SOLN
INTRAMUSCULAR | Status: DC | PRN
  Administered 2011-11-02: 50 ug via INTRAVENOUS
  Administered 2011-11-02 (×2): 100 ug via INTRAVENOUS
  Administered 2011-11-02: 50 ug via INTRAVENOUS

## 2011-11-02 MED ORDER — DEXAMETHASONE SODIUM PHOSPHATE 4 MG/ML IJ SOLN
INTRAMUSCULAR | Status: DC | PRN
  Administered 2011-11-02 (×2): 10 mg via INTRAVENOUS

## 2011-11-02 MED ORDER — FENTANYL CITRATE 0.05 MG/ML IJ SOLN
INTRAMUSCULAR | Status: DC | PRN
  Administered 2011-11-02 (×2): 50 ug via INTRAVENOUS

## 2011-11-02 MED ORDER — LACTATED RINGERS IV SOLN: INTRAVENOUS | Status: DC

## 2011-11-02 SURGICAL SUPPLY — 21 items
BAG URINE DRAINAGE (UROLOGICAL SUPPLIES) ×2 IMPLANT
BAG URO CATCHER STRL LF (DRAPE) ×2 IMPLANT
CATH FOLEY 2WAY SLVR  5CC 18FR (CATHETERS) ×1
CATH FOLEY 2WAY SLVR 5CC 18FR (CATHETERS) ×1 IMPLANT
CLOTH BEACON ORANGE TIMEOUT ST (SAFETY) ×2 IMPLANT
DRAPE CAMERA CLOSED 9X96 (DRAPES) ×2 IMPLANT
ELECT BUTTON HF 24-28F 2 30DE (ELECTRODE) IMPLANT
ELECT LOOP MED HF 24F 12D (CUTTING LOOP) IMPLANT
ELECT LOOP MED HF 24F 12D CBL (CLIP) IMPLANT
ELECT RESECT VAPORIZE 12D CBL (ELECTRODE) IMPLANT
GLOVE BIOGEL M STRL SZ7.5 (GLOVE) ×2 IMPLANT
GLOVE SURG SS PI 6.5 STRL IVOR (GLOVE) ×2 IMPLANT
GOWN STRL REIN XL XLG (GOWN DISPOSABLE) ×4 IMPLANT
HOLDER FOLEY CATH W/STRAP (MISCELLANEOUS) ×2 IMPLANT
LASER FIBER /GREENLIGHT LASER (Laser) ×2 IMPLANT
LASER GREENLIGHT RENTAL P/PROC (Laser) ×2 IMPLANT
MANIFOLD NEPTUNE II (INSTRUMENTS) ×2 IMPLANT
PACK CYSTO (CUSTOM PROCEDURE TRAY) ×2 IMPLANT
SYR 30ML LL (SYRINGE) IMPLANT
SYRINGE IRR TOOMEY STRL 70CC (SYRINGE) IMPLANT
TUBING CONNECTING 10 (TUBING) ×2 IMPLANT

## 2011-11-02 NOTE — Preoperative (Signed)
Beta Blockers   Reason not to administer Beta Blockers:Not Applicable, not on home BB 

## 2011-11-02 NOTE — Anesthesia Postprocedure Evaluation (Signed)
  Anesthesia Post-op Note  Patient: Jose Lawson.  Procedure(s) Performed: Procedure(s) (LRB): GREEN LIGHT LASER TURP (TRANSURETHRAL RESECTION OF PROSTATE (N/A)  Patient Location: PACU  Anesthesia Type: General  Level of Consciousness: awake and alert   Airway and Oxygen Therapy: Patient Spontanous Breathing  Post-op Pain: mild  Post-op Assessment: Post-op Vital signs reviewed, Patient's Cardiovascular Status Stable, Respiratory Function Stable, Patent Airway and No signs of Nausea or vomiting  Post-op Vital Signs: stable  Complications: No apparent anesthesia complications

## 2011-11-02 NOTE — Transfer of Care (Signed)
Immediate Anesthesia Transfer of Care Note  Patient: Jose Gibney Millar Sr.  Procedure(s) Performed: Procedure(s) (LRB) with comments: GREEN LIGHT LASER TURP (TRANSURETHRAL RESECTION OF PROSTATE (N/A)  Patient Location: PACU  Anesthesia Type: General  Level of Consciousness: awake, alert , patient cooperative and responds to stimulation  Airway & Oxygen Therapy: Patient Spontanous Breathing and Patient connected to face mask oxygen  Post-op Assessment: Report given to PACU RN, Post -op Vital signs reviewed and stable and Patient moving all extremities  Post vital signs: Reviewed and stable  Complications: No apparent anesthesia complications

## 2011-11-02 NOTE — Anesthesia Preprocedure Evaluation (Addendum)
Anesthesia Evaluation  Patient identified by MRN, date of birth, ID band Patient awake    Reviewed: Allergy & Precautions, H&P , NPO status , Patient's Chart, lab work & pertinent test results  History of Anesthesia Complications (+) DIFFICULT AIRWAY  Airway Mallampati: II TM Distance: >3 FB Neck ROM: Full    Dental No notable dental hx.    Pulmonary former smoker,  breath sounds clear to auscultation  Pulmonary exam normal       Cardiovascular Exercise Tolerance: Good hypertension, Pt. 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 Denies rhythm problem   Neuro/Psych negative neurological ROS  negative psych ROS   GI/Hepatic Neg liver ROS, GERD-  Medicated,  Endo/Other  Hypothyroidism   Renal/GU negative Renal ROS  negative genitourinary   Musculoskeletal negative musculoskeletal ROS (+)   Abdominal   Peds negative pediatric ROS (+)  Hematology negative hematology ROS (+)   Anesthesia Other Findings   Reproductive/Obstetrics negative OB ROS                         Anesthesia Physical Anesthesia Plan  ASA: III  Anesthesia Plan: General   Post-op Pain Management:    Induction: Intravenous  Airway Management Planned: LMA  Additional Equipment:   Intra-op Plan:   Post-operative Plan: Extubation in OR  Informed Consent: I have reviewed the patients History and Physical, chart, labs and discussed the procedure including the risks, benefits and alternatives for the proposed anesthesia with the patient or authorized representative who has indicated his/her understanding and acceptance.   Dental advisory given  Plan Discussed with: CRNA  Anesthesia Plan Comments:         Anesthesia Quick Evaluation

## 2011-11-02 NOTE — Interval H&P Note (Signed)
History and Physical Interval Note:  11/02/2011 9:44 AM  Jose S Dunavan Sr.  has presented today for surgery, with the diagnosis of benign prostatic hypertrophy, Bladder outlet obstruction  The various methods of treatment have been discussed with the patient and family. After consideration of risks, benefits and other options for treatment, the patient has consented to  Procedure(s) (LRB) with comments: GREEN LIGHT LASER TURP (TRANSURETHRAL RESECTION OF PROSTATE (N/A) as a surgical intervention .  The patient's history has been reviewed, patient examined, no change in status, stable for surgery.  I have reviewed the patient's chart and labs.  Questions were answered to the patient's satisfaction.  We also discussed possible bladder biopsy or TURBT if recurrent bladder ca found. We discussed side effects of the proposed treatment, the likelihood of the patient achieving the goals of the procedure, and any potential problems that might occur during the procedure or recuperation.    Antony Haste

## 2011-11-02 NOTE — Op Note (Signed)
Preop diagnosis: Benign prostatic hyperplasia, bladder outlet obstruction, lower urinary tract symptoms, history of bladder cancer Postop diagnosis: Same  Procedure: Exam under anesthesia Cystoscopy Greenlight photo vaporization prostate  Surgeon: Mena Goes  Anesthesia: Gen.  Findings: Exam under anesthesia-the penis and testicles were normal without lesion or mass. On bimanual exam the prostate was palpably normal without heart area or nodule, the bladder and abdomen were soft and without mass. Cystoscopy-the urethra was normal. The prostate was 100% visually occlusive from a large lateral lobes. the bladder had severe trabeculation but no tumors were seen. Clear bilaterally eflux was seen pre-and post lasering from the ureteral orifice these left and right.  Procedure: After consent was obtained patient brought the operating room. A timeout was performed to confirm the patient and procedure. After adequate anesthesia the patient was placed in lithotomy position and examined. He was then prepped and draped in the usual fashion. The laser scope was passed per urethra with the visual obturator and the bladder examined in its entirety. The visual obturator was removed and the laser sheath place. The laser fiber was deployed in the prostate vaporized from the bladder neck down to the area on the right side in the left side. Patient's bladder was drained the ureteral orifice these were examined and noted to be normal. Residual prostate tissue on both sides was vaporized creating a good channel. I was careful not to laser distal to the veru. Hemostasis was excellent. Pre-and post images were obtained and the scope removed. An 78 French Foley was placed and left gravity drainage with clear urine. The patient was awakened taken to recovery in stable condition.  Complications: None  Estimated blood loss: 0  Drains: 18 French Foley  Specimens: None  Disposition: Patient stable to PACU

## 2011-12-13 ENCOUNTER — Other Ambulatory Visit: Payer: Self-pay | Admitting: Dermatology

## 2012-02-16 ENCOUNTER — Encounter (HOSPITAL_COMMUNITY): Payer: Self-pay

## 2012-02-16 ENCOUNTER — Inpatient Hospital Stay (HOSPITAL_COMMUNITY)
Admission: AD | Admit: 2012-02-16 | Discharge: 2012-02-18 | DRG: 864 | Disposition: A | Discharge status: Home / Self Care | Payer: Medicare Other | Source: Ambulatory Visit | Attending: Urology | Admitting: Urology

## 2012-02-16 DIAGNOSIS — Z7982 Long term (current) use of aspirin: Secondary | ICD-10-CM

## 2012-02-16 DIAGNOSIS — I10 Essential (primary) hypertension: Secondary | ICD-10-CM | POA: Diagnosis present

## 2012-02-16 DIAGNOSIS — Z87891 Personal history of nicotine dependence: Secondary | ICD-10-CM

## 2012-02-16 DIAGNOSIS — T50A95A Adverse effect of other bacterial vaccines, initial encounter: Secondary | ICD-10-CM | POA: Diagnosis present

## 2012-02-16 DIAGNOSIS — Z79899 Other long term (current) drug therapy: Secondary | ICD-10-CM

## 2012-02-16 DIAGNOSIS — E890 Postprocedural hypothyroidism: Secondary | ICD-10-CM | POA: Diagnosis present

## 2012-02-16 DIAGNOSIS — I2510 Atherosclerotic heart disease of native coronary artery without angina pectoris: Secondary | ICD-10-CM | POA: Diagnosis present

## 2012-02-16 DIAGNOSIS — E785 Hyperlipidemia, unspecified: Secondary | ICD-10-CM | POA: Diagnosis present

## 2012-02-16 DIAGNOSIS — C679 Malignant neoplasm of bladder, unspecified: Secondary | ICD-10-CM | POA: Diagnosis present

## 2012-02-16 DIAGNOSIS — Z85850 Personal history of malignant neoplasm of thyroid: Secondary | ICD-10-CM

## 2012-02-16 DIAGNOSIS — R509 Fever, unspecified: Principal | ICD-10-CM | POA: Diagnosis present

## 2012-02-16 LAB — CBC WITH DIFFERENTIAL/PLATELET
Basophils Absolute: 0 10*3/uL (ref 0.0–0.1)
Basophils Relative: 0 % (ref 0–1)
MCHC: 32.2 g/dL (ref 30.0–36.0)
Neutro Abs: 10.9 10*3/uL — ABNORMAL HIGH (ref 1.7–7.7)
Neutrophils Relative %: 87 % — ABNORMAL HIGH (ref 43–77)
RDW: 14.4 % (ref 11.5–15.5)

## 2012-02-16 LAB — BASIC METABOLIC PANEL
BUN: 32 mg/dL — ABNORMAL HIGH (ref 6–23)
Calcium: 9 mg/dL (ref 8.4–10.5)
Creatinine, Ser: 1.16 mg/dL (ref 0.50–1.35)
GFR calc Af Amer: 65 mL/min — ABNORMAL LOW (ref >90)
GFR calc non Af Amer: 56 mL/min — ABNORMAL LOW (ref >90)

## 2012-02-16 MED ORDER — SODIUM CHLORIDE 0.9 % IV SOLN
INTRAVENOUS | Status: DC
  Administered 2012-02-16 – 2012-02-17 (×2): via INTRAVENOUS

## 2012-02-16 MED ORDER — ARIPIPRAZOLE 2 MG PO TABS
2.0000 mg | ORAL_TABLET | Freq: Every evening | ORAL | Status: DC
  Administered 2012-02-16 – 2012-02-17 (×2): 2 mg via ORAL
  Filled 2012-02-16 (×3): qty 1

## 2012-02-16 MED ORDER — NON FORMULARY: 4.0000 mg | Freq: Every day | Status: DC

## 2012-02-16 MED ORDER — DUTASTERIDE 0.5 MG PO CAPS
0.5000 mg | ORAL_CAPSULE | Freq: Every day | ORAL | Status: DC
  Administered 2012-02-17: 0.5 mg via ORAL
  Filled 2012-02-16 (×3): qty 1

## 2012-02-16 MED ORDER — METHYLPREDNISOLONE SODIUM SUCC 40 MG IJ SOLR
40.0000 mg | Freq: Every day | INTRAMUSCULAR | Status: DC
  Administered 2012-02-16: 40 mg via INTRAVENOUS
  Filled 2012-02-16: qty 1

## 2012-02-16 MED ORDER — TRIAMTERENE-HCTZ 37.5-25 MG PO CAPS
1.0000 | ORAL_CAPSULE | Freq: Every day | ORAL | Status: DC
  Administered 2012-02-16 – 2012-02-18 (×3): 1 via ORAL
  Filled 2012-02-16 (×3): qty 1

## 2012-02-16 MED ORDER — METHYLPREDNISOLONE SODIUM SUCC 40 MG IJ SOLR
20.0000 mg | Freq: Once | INTRAMUSCULAR | Status: AC
  Administered 2012-02-17: 20 mg via INTRAVENOUS
  Filled 2012-02-16: qty 0.5

## 2012-02-16 MED ORDER — ATORVASTATIN CALCIUM 20 MG PO TABS
20.0000 mg | ORAL_TABLET | Freq: Every day | ORAL | Status: DC
Start: 2012-02-16 — End: 2012-02-18
  Administered 2012-02-16 – 2012-02-18 (×3): 20 mg via ORAL
  Filled 2012-02-16 (×3): qty 1

## 2012-02-16 MED ORDER — ACETAMINOPHEN 325 MG PO TABS: 650.0000 mg | ORAL_TABLET | ORAL | Status: DC | PRN

## 2012-02-16 MED ORDER — PANTOPRAZOLE SODIUM 40 MG PO TBEC
40.0000 mg | DELAYED_RELEASE_TABLET | Freq: Every day | ORAL | Status: DC
  Administered 2012-02-16 – 2012-02-18 (×3): 40 mg via ORAL
  Filled 2012-02-16 (×3): qty 1

## 2012-02-16 MED ORDER — SILODOSIN 4 MG PO CAPS
4.0000 mg | ORAL_CAPSULE | Freq: Every day | ORAL | Status: DC
  Administered 2012-02-16: 4 mg via ORAL
  Filled 2012-02-16 (×3): qty 1

## 2012-02-16 MED ORDER — FLUOXETINE HCL 20 MG PO TABS
20.0000 mg | ORAL_TABLET | Freq: Every day | ORAL | Status: DC
  Administered 2012-02-16 – 2012-02-17 (×2): 20 mg via ORAL
  Filled 2012-02-16 (×3): qty 1

## 2012-02-16 MED ORDER — HEPARIN SODIUM (PORCINE) 5000 UNIT/ML IJ SOLN
5000.0000 [IU] | Freq: Three times a day (TID) | INTRAMUSCULAR | Status: DC
  Administered 2012-02-16 – 2012-02-18 (×6): 5000 [IU] via SUBCUTANEOUS
  Filled 2012-02-16 (×9): qty 1

## 2012-02-16 MED ORDER — DEXTROSE 5 % IV SOLN
1.0000 g | INTRAVENOUS | Status: DC
  Administered 2012-02-16: 1 g via INTRAVENOUS
  Filled 2012-02-16 (×2): qty 10

## 2012-02-16 MED ORDER — SENNOSIDES-DOCUSATE SODIUM 8.6-50 MG PO TABS
1.0000 | ORAL_TABLET | Freq: Every evening | ORAL | Status: DC | PRN
  Filled 2012-02-16: qty 1

## 2012-02-16 MED ORDER — CYCLOSPORINE 0.05 % OP EMUL
1.0000 [drp] | Freq: Two times a day (BID) | OPHTHALMIC | Status: DC
  Administered 2012-02-16 – 2012-02-18 (×4): 1 [drp] via OPHTHALMIC
  Filled 2012-02-16 (×6): qty 1

## 2012-02-16 MED ORDER — LEVOTHYROXINE SODIUM 150 MCG PO TABS
150.0000 ug | ORAL_TABLET | Freq: Every day | ORAL | Status: DC
  Administered 2012-02-16 – 2012-02-18 (×3): 150 ug via ORAL
  Filled 2012-02-16 (×5): qty 1

## 2012-02-16 MED ORDER — ASPIRIN EC 81 MG PO TBEC
81.0000 mg | DELAYED_RELEASE_TABLET | Freq: Every day | ORAL | Status: DC
  Administered 2012-02-16 – 2012-02-18 (×3): 81 mg via ORAL
  Filled 2012-02-16 (×3): qty 1

## 2012-02-16 NOTE — H&P (Addendum)
Urology History and Physical Exam  CC: Fever  HPI: 79 year old male with a history of bladder cancer is admitted for fever. He has been treated with intravesical BCG for his bladder cancer. He has had previous reactions which were local reactions to the BCG. He received a dose of BCG yesterday, 02/15/12. The patient states that he had fever to 102.8 last night. This is been reduced with acetaminophen. His highest temperature today was 100. It is not associated with body aches. Is not associated with shortness of breath or chest pain. He has not had a fever this side following BCG previously. His BCG dose was one third concentration. My first concern regarding a patient with a high temperature such as this would need BCG sepsis. The patient states he has not had a fever since that time and he actually feels very well. I did ask him to be directly admitted to the hospital so we can obtain blood and urine cultures and start antibiotics. I also recommended starting a steroid to combat the bodies reaction to the BCG. We discussed the risk and benefits of all these things.  PMH: Past Medical History  Diagnosis Date  . Hypertension   . Hx of thyroid cancer     s/p total thyroidectomy,  . Hypothyroidism   . Hyperlipidemia   . 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 Coronary artery disease CARDIOLOGIST- DR Hillis Range    LAST VISIT NOTE 08-13-10 AND STRESS TEST 08-17-10 IN EPIC (LOW RISK ,  NO ISCHEMIA)  . History of basal cell carcinoma excision BILATERAL ARMS  . Arthritis     BACK  . GERD (gastroesophageal reflux disease)     CONTROLLED W/ PROTONIX  . Bladder tumor     CYSTO W/ BX SCHEDULED 12-15-10  . Nocturia   . Cancer     hx of bladder and thyroid cancer   . Complication of anesthesia 12/2010    told he had a "stiff neck" during intubation     PSH: Past Surgical History  Procedure Date  . Total thyroidectomy 1991  . Cataract extraction w/  intraocular lens  implant, bilateral   . Cystoscopy with biopsy 12/16/2010    Procedure: CYSTOSCOPY WITH BIOPSY;  Surgeon: Milford Cage, MD;  [facility];  Service: Urology;  Laterality: N/A;  . Transurethral resection of bladder tumor 12/16/2010    Procedure: TRANSURETHRAL RESECTION OF BLADDER TUMOR (TURBT);  Surgeon: Milford Cage, MD;  Location: [facility];  Service: Urology;  Laterality: N/A;  . Cystoscopy/retrograde/ureteroscopy 12/16/2010    Procedure: CYSTOSCOPY/RETROGRADE/URETEROSCOPY;  Surgeon: Milford Cage, MD;  Location: [facility];  Service: Urology;  Laterality: Right;  bilateral retrograde, right ureteroscopy  . Eye surgery     attached lens to iris    Allergies: No Known Allergies  Medications: Prescriptions prior to admission  Medication Sig Dispense Refill  . acetaminophen (TYLENOL) 325 MG tablet Take 325 mg by mouth every 6 (six) hours as needed. Pain/fever      . ARIPiprazole (ABILIFY) 2 MG tablet Take 2 mg by mouth every evening.      [name] aspirin EC 81 MG tablet Take 81 mg by mouth daily.      [name] atorvastatin (LIPITOR) 20 MG tablet Take 1 tablet by mouth every morning.       . Cyanocobalamin (VITAMIN B 12 PO) Take 1 tablet by mouth daily.      [name]  cycloSPORINE (RESTASIS) 0.05 % ophthalmic emulsion Place 1 drop into both eyes 2 (two) times daily.      [name] FLUoxetine (PROZAC) 20 MG capsule Take 20 mg by mouth daily before breakfast.      . pantoprazole (PROTONIX) 40 MG tablet Take 40 mg by mouth every morning.      [name] SYNTHROID 150 MCG tablet Take 1 tablet by mouth every morning.       . triamterene-hydrochlorothiazide (DYAZIDE) 37.5-25 MG per capsule Take 1 tablet by mouth every morning.          Social History: History   Social History  . Marital Status: Married    Spouse Name: N/A    Number of Children: N/A  . Years of Education: N/A   Occupational History  . Not on file.   Social  History Main Topics  . Smoking status: Former Smoker    Quit date: 05/12/1981  . Smokeless tobacco: Never Used     Comment: quit 1983  . Alcohol Use: 0.0 oz/week     Comment: 6-oz vodka per day   . Drug Use: No  . Sexually Active: No   Other Topics Concern  . Not on file   Social History Narrative   Lives in [place] with his spouse.Retired from [employer].    Family History: Family History  Problem Relation Age of Onset  . Lung cancer Brother   . Thyroid cancer    . Diabetes Father     Review of Systems: Positive: Emesis x1. Negative: Dysuria, gross hematuria, chest pain, changes in vision or hearing.  A further 10 point review of systems was negative except what is listed in the HPI.  Physical Exam: Filed Vitals:   02/16/12 1009  BP: 122/48  Pulse: 81  Temp: 98.1 F (36.7 C)  Resp: 16    General: No acute distress.  Awake. Head:  Normocephalic.  Atraumatic. ENT:  EOMI.  Mucous membranes moist Neck:  Supple.  No lymphadenopathy. CV:  S1 present. S2 present. Regular rate. Pulmonary: Equal effort bilaterally.  Clear to auscultation bilaterally. Abdomen: Soft.  Non- tender to palpation. Skin:  Normal turgor.  No visible rash. Extremity: No gross deformity of bilateral upper extremities.  No gross deformity of    bilateral lower extremities. Neurologic: Alert. Appropriate mood. CN 2-12 grossly intact. No UE or LE weakness   Studies:  No results found for this basename: HGB:2,WBC:2,PLT:2 in the last 72 hours  No results found for this basename: NA:2,K:2,CL:2,CO2:2,BUN:2,CREATININE:2,CALCIUM:2,MAGNESIUM:2,GFRNONAA:2,GFRAA:2 in the last 72 hours   No results found for this basename: PT:2,INR:2,APTT:2 in the last 72 hours   No components found with this basename: ABG:2    Assessment:  Fever following BCG intravesical treatment.  Plan: Rule out BCG sepsis by obtaining blood cultures for aerobic, anaerobic, and AFB as well as urine culture for bacteria and  AFB.  I will start him prophylactically on Rocephin 1 g IV daily with pharmacy adjustment of his renal function.  Hold on antituberculous drugs at this point, but I will start methylprednisolone 40mg  IV daily.  Prophylactic measures with SCDs, ambulation, and heparin subcutaneously.  I will also get an Infectious Disease consult for recommendations regarding steroid management and anti-tuberculous drugs.

## 2012-02-17 DIAGNOSIS — R509 Fever, unspecified: Secondary | ICD-10-CM | POA: Diagnosis present

## 2012-02-17 LAB — URINE CULTURE: Colony Count: 7000

## 2012-02-17 LAB — CBC
HCT: 34.9 % — ABNORMAL LOW (ref 39.0–52.0)
MCH: 28.2 pg (ref 26.0–34.0)
MCHC: 32.1 g/dL (ref 30.0–36.0)
MCV: 87.9 fL (ref 78.0–100.0)
Platelets: 301 10*3/uL (ref 150–400)
RDW: 14.2 % (ref 11.5–15.5)
WBC: 10.9 10*3/uL — ABNORMAL HIGH (ref 4.0–10.5)

## 2012-02-17 LAB — BASIC METABOLIC PANEL
BUN: 28 mg/dL — ABNORMAL HIGH (ref 6–23)
CO2: 25 mEq/L (ref 19–32)
Calcium: 8.8 mg/dL (ref 8.4–10.5)
Creatinine, Ser: 1.02 mg/dL (ref 0.50–1.35)
Glucose, Bld: 133 mg/dL — ABNORMAL HIGH (ref 70–99)

## 2012-02-17 MED ORDER — CIPROFLOXACIN HCL 500 MG PO TABS
500.0000 mg | ORAL_TABLET | Freq: Two times a day (BID) | ORAL | Status: DC
  Administered 2012-02-17 – 2012-02-18 (×3): 500 mg via ORAL
  Filled 2012-02-17 (×6): qty 1

## 2012-02-17 MED ORDER — CIPROFLOXACIN HCL 500 MG PO TABS: 500.0000 mg | ORAL_TABLET | Freq: Two times a day (BID) | ORAL | Status: DC

## 2012-02-17 NOTE — Progress Notes (Signed)
GU  Patient doing well. No fever. No chills. No signs of infection.  Started on oral Cipro earlier today and doing well.  Filed Vitals:   02/17/12 1405  BP: 145/65  Pulse: 76  Temp: 98.2 F (36.8 C)  Resp: 16   Gen: NAD, awake Abd: soft, NTTP  A/P: Fever. Concern for BCGosis vs  BCG sepsis.  This is sepsis at this point.  Urine cultures and blood cultures pending. No pulmonary results other than AFB stain negative on blood. This is encouraging.  Infectious disease has not seen the patient. At this point I'm not sure there will be much benefit. No further steroids at this point. I will cancel the consult. We will reconsult infectious disease if the patient has a problem.  The patient is scheduled to follow up as an outpatient. Discharge followup retinal chart.  Pelvic prescription he will be discharged with ciprofloxacin. This was printed and put on his chart.

## 2012-02-17 NOTE — Progress Notes (Signed)
Patient evaluated for long-term disease management services with [facility] Care Management Program as a benefit of his [employer]. Spoke with patient and wife at bedside to explain [facility] Care Management. Explained that he will receive a post transition of care call. Reports discharge plan is to return home. [facility] Care Management brochure and contact information left with wife. Appreciative of visit.  Raiford Noble, MSN- Ed, RN,BSN [facility] Liaison [phone]

## 2012-02-17 NOTE — Progress Notes (Signed)
   CARE MANAGEMENT NOTE 02/17/2012  Patient:  Jose Lawson, Jose Lawson   Account Number:  [id number]  Date Initiated:  02/17/2012  Documentation initiated by:  Jiles Crocker  Subjective/Objective Assessment:   ADMITTED WITH FEVER, HISTORY OF BLADDER CANCER     Action/Plan:   LIVES AT HOME WITH SPOUSE; CM FOLLOWING FOR POSSIBLE HHC NEEDS   Anticipated DC Date:  02/24/2012   Anticipated DC Plan:  HOME W HOME HEALTH SERVICES      DC Planning Services  CM consult             Status of service:  In process, will continue to follow Medicare Important Message given?  NA - LOS <3 / Initial given by admissions (If response is "NO", the following Medicare IM given date fields will be blank)  Per UR Regulation:  Reviewed for med. necessity/level of care/duration of stay  Comments:  02/17/2012- B Rayder Sullenger RN,BSN,MHA

## 2012-02-17 NOTE — Progress Notes (Signed)
Urology Progress Note  Subjective:     No acute urologic events overnight. Negative body aches, chills, or arthritis. Voiding without hematuria, dysuria, or urgency. Negative fatigue. No fever since admission.    ROS: Negative: chest pain or SOB.  Objective:  Patient Vitals for the past 24 hrs:  BP Temp Temp src Pulse Resp SpO2 Height Weight  02/17/12 0500 130/68 mmHg 97.6 F (36.4 C) Oral 66  16  95 % - -  02/16/12 2140 133/59 mmHg 98.4 F (36.9 C) Oral 83  18  94 % - -  02/16/12 1505 140/57 mmHg 98.1 F (36.7 C) Oral 81  16  95 % - -  02/16/12 1009 122/48 mmHg 98.1 F (36.7 C) Oral 81  16  98 % 6\' 4"  (1.93 m) 91.627 kg (202 lb)    Physical Exam: General:  No acute distress, awake Cardiovascular:    [x]   S1/S2 present, RRR  []   Irregularly irregular Chest:  CTA-B Abdomen:               []  Soft, appropriately TTP  [x]  Soft, NTTP  []  Soft, appropriately TTP, incision(s) clean/dry/intact  Genitourinary: No foley catheter.     I/O last 3 completed shifts: In: 1299.2 [P.O.:240; I.V.:1009.2; IV Piggyback:50] Out: 1475 [Urine:1475]  Recent Labs  [facility] 02/17/12 0428 02/16/12 1215   HGB 11.2* 10.9*   WBC 10.9* 12.6*   PLT 301 276    Recent Labs  Basename 02/17/12 0428 02/16/12 1215   NA 136 136   K 4.0 3.8   CL 100 98   CO2 25 29   BUN 28* 32*   CREATININE 1.02 1.16   CALCIUM 8.8 9.0   GFRNONAA 65* 56*   GFRAA 76* 65*     No results found for this basename: PT:2,INR:2,APTT:2 in the last 72 hours   No components found with this basename: ABG:2    Length of stay: 1 days.  Assessment: Fever. Following intravesical BCG- 102.8    Plan: -Transition to an oral antibiotic from rocephin. Start PO cipro today. -I have consulted Infectious Disease for input. Dr. Drue Second recommended tapering steroid. Will decrease dose to 20mg  IV today then none tomorrow. -Possible discharge home tomorrow if he remains afebrile. -Follow up cultures.   Natalia Leatherwood,  MD [phone]

## 2012-02-18 NOTE — Progress Notes (Addendum)
Patient discharged home, all discharge medications and instructions reviewed and questions answered. Patient refused wheelchair assistance, states will ambulate to vehicle.

## 2012-02-18 NOTE — Discharge Summary (Signed)
Physician Discharge Summary  Patient ID: Jose Gibney Mihalik Sr. MRN: [id number] DOB/AGE: Jan 06, 1926 79 y.o.  Admit date: 02/16/2012 Discharge date: 02/18/2012  Admission Diagnoses: Fever  Discharge Diagnoses:  Principal Problem:  *Fever   Discharged Condition: good  Hospital Course: Patient admitted with fever following BCG administration in office. He was treated with steroid dose taper and Cipro. He has remained AFVSS.   Consults: None  Significant Diagnostic Studies: Labs - negative urine AFB, negative urine Cx  Treatments: IV hydration  Discharge Exam: Blood pressure 142/72, pulse 68, temperature 97.8 F (36.6 C), temperature source Oral, resp. rate 16, height 6\' 4"  (1.93 m), weight 91.627 kg (202 lb), SpO2 96.00%. NAD A&Ox3 Abd - soft, NT Urine clear yellow in urinal.  Disposition: 01-Home or Self Care     Medication List     As of 02/18/2012 10:47 AM    TAKE these medications         acetaminophen 325 MG tablet   Commonly known as: TYLENOL   Take 325 mg by mouth every 6 (six) hours as needed. Pain/fever      ARIPiprazole 2 MG tablet   Commonly known as: ABILIFY   Take 2 mg by mouth every evening.      aspirin EC 81 MG tablet   Take 81 mg by mouth daily.      atorvastatin 20 MG tablet   Commonly known as: LIPITOR   Take 1 tablet by mouth every morning.      ciprofloxacin 500 MG tablet   Commonly known as: CIPRO   Take 1 tablet (500 mg total) by mouth 2 (two) times daily.      cycloSPORINE 0.05 % ophthalmic emulsion   Commonly known as: RESTASIS   Place 1 drop into both eyes 2 (two) times daily.      FLUoxetine 20 MG capsule   Commonly known as: PROZAC   Take 20 mg by mouth daily before breakfast.      pantoprazole 40 MG tablet   Commonly known as: PROTONIX   Take 40 mg by mouth every morning.      SYNTHROID 150 MCG tablet   Generic drug: levothyroxine   Take 1 tablet by mouth every morning.      triamterene-hydrochlorothiazide 37.5-25 MG per  capsule   Commonly known as: DYAZIDE   Take 1 tablet by mouth every morning.      VITAMIN B 12 PO   Take 1 tablet by mouth daily.           Follow-up Information    Follow up with [facility], NP. On 03/02/2012. (11:00 am)    Contact information:   [address] [facility] [place] [address] [phone]          Signed: Antony Haste 02/18/2012, 10:47 AM

## 2012-02-23 LAB — CULTURE, BLOOD (ROUTINE X 2): Culture: NO GROWTH

## 2012-03-13 ENCOUNTER — Other Ambulatory Visit: Payer: Self-pay | Admitting: Dermatology

## 2012-03-30 LAB — AFB CULTURE, BLOOD

## 2012-05-17 LAB — AFB CULTURE WITH SMEAR (NOT AT ARMC): Acid Fast Smear: NONE SEEN

## 2012-06-12 ENCOUNTER — Other Ambulatory Visit: Payer: Self-pay | Admitting: Dermatology

## 2012-07-18 ENCOUNTER — Other Ambulatory Visit (HOSPITAL_COMMUNITY): Payer: Self-pay | Admitting: Internal Medicine

## 2012-07-18 DIAGNOSIS — R0602 Shortness of breath: Secondary | ICD-10-CM

## 2012-07-20 ENCOUNTER — Ambulatory Visit (HOSPITAL_COMMUNITY)
Admission: RE | Admit: 2012-07-20 | Discharge: 2012-07-20 | Disposition: A | Discharge status: Home / Self Care | Payer: Medicare Other | Source: Ambulatory Visit | Attending: Internal Medicine | Admitting: Internal Medicine

## 2012-07-20 DIAGNOSIS — R0602 Shortness of breath: Secondary | ICD-10-CM | POA: Insufficient documentation

## 2012-07-20 DIAGNOSIS — J988 Other specified respiratory disorders: Secondary | ICD-10-CM | POA: Insufficient documentation

## 2012-07-20 DIAGNOSIS — R0989 Other specified symptoms and signs involving the circulatory and respiratory systems: Secondary | ICD-10-CM | POA: Insufficient documentation

## 2012-07-20 DIAGNOSIS — F17200 Nicotine dependence, unspecified, uncomplicated: Secondary | ICD-10-CM | POA: Insufficient documentation

## 2012-07-20 DIAGNOSIS — R0609 Other forms of dyspnea: Secondary | ICD-10-CM | POA: Insufficient documentation

## 2012-07-20 MED ORDER — ALBUTEROL SULFATE (5 MG/ML) 0.5% IN NEBU
2.5000 mg | INHALATION_SOLUTION | Freq: Once | RESPIRATORY_TRACT | Status: AC
  Administered 2012-07-20: 2.5 mg via RESPIRATORY_TRACT

## 2012-07-24 ENCOUNTER — Encounter (HOSPITAL_COMMUNITY): Payer: Medicare Other

## 2012-08-08 ENCOUNTER — Ambulatory Visit (INDEPENDENT_AMBULATORY_CARE_PROVIDER_SITE_OTHER): Payer: Medicare Other | Admitting: Internal Medicine

## 2012-08-08 ENCOUNTER — Encounter: Payer: Self-pay | Admitting: Internal Medicine

## 2012-08-08 VITALS — BP 118/62 | HR 81 | Temp 97.7°F | Ht 74.5 in | Wt 200.4 lb

## 2012-08-08 DIAGNOSIS — J449 Chronic obstructive pulmonary disease, unspecified: Secondary | ICD-10-CM | POA: Insufficient documentation

## 2012-08-08 DIAGNOSIS — R0602 Shortness of breath: Secondary | ICD-10-CM

## 2012-08-08 DIAGNOSIS — J984 Other disorders of lung: Secondary | ICD-10-CM | POA: Insufficient documentation

## 2012-08-08 MED ORDER — FAMOTIDINE 20 MG PO TABS: ORAL_TABLET | ORAL | Status: DC

## 2012-08-08 NOTE — Progress Notes (Signed)
  Subjective:    Patient ID: MACEN JOSLIN Sr., male    DOB: [birth year]   MRN: [id number]  HPI  79 yowm  Quit 1984 no resp problems then started with sob x 2010 referred 08/08/2012 to pulmonary clinic by Dr Dossie Arbour.  08/08/2012 1st pulmonary eval cc indolent min progressive   doe x 4 years first with hills and worsened where one flight feels sob but doesn't stop at top.  Also some pnds and throat clearing x sev years before sob. Some hb better on protonix after bfast. Has not tried inhalers yet  No obvious daytime variabilty or assoc   cp or chest tightness, subjective wheeze overt sinus or active hb symptoms. No unusual exp hx or h/o childhood pna/ asthma or knowledge of premature birth.   Sleeping ok without nocturnal  or early am exacerbation  of respiratory  c/o's or need for noct saba. Also denies any obvious fluctuation of symptoms with weather or environmental changes or other aggravating or alleviating factors except as outlined above   Review of Systems  Constitutional: Negative for fever and unexpected weight change.  HENT: Positive for rhinorrhea and postnasal drip. Negative for ear pain, nosebleeds, congestion, sore throat, sneezing, trouble swallowing, dental problem and sinus pressure.   Eyes: Negative for redness and itching.  Respiratory: Positive for cough and shortness of breath. Negative for chest tightness and wheezing.   Cardiovascular: Negative for palpitations and leg swelling.  Gastrointestinal: Negative for nausea and vomiting.       Acid heartburn//indigestion   Genitourinary: Negative for dysuria.  Musculoskeletal: Negative for joint swelling.  Skin: Negative for rash.  Neurological: Negative for headaches.  Hematological: Does not bruise/bleed easily.  Psychiatric/Behavioral: Positive for dysphoric mood ( treated with medications). The patient is not nervous/anxious.        Objective:   Physical Exam  amb wm with raspy voice nad Wt Readings from Last  3 Encounters:  08/08/12 200 lb 6.4 oz (90.901 kg)  02/16/12 202 lb (91.627 kg)  10/28/11 203 lb 9.6 oz (92.352 kg)     HEENT mild turbinate edema.  Oropharynx no thrush or excess pnd or cobblestoning.  No JVD or cervical adenopathy. Mild accessory muscle hypertrophy. Trachea midline, nl thryroid. Chest was slt hyperinflated by percussion with diminished breath sounds and min increased exp time without wheeze. Hoover sign positive at  End  inspiration. Regular rate and rhythm without murmur gallop or rub or increase P2 or edema.  Abd: no hsm, nl excursion. Ext warm without cyanosis or clubbing.           Assessment & Plan:

## 2012-08-08 NOTE — Patient Instructions (Addendum)
Pantoprazole (protonix) 40 mg   Take 30-60 min before first meal of the day and Pepcid 20 mg one bedtime until return to office - this is the best way to tell whether stomach acid is contributing to your problem.    GERD (REFLUX)  is an extremely common cause of respiratory symptoms, many times with no significant heartburn at all.    It can be treated with medication, but also with lifestyle changes including avoidance of late meals, excessive alcohol, smoking cessation, and avoid fatty foods, chocolate, peppermint, colas, red wine, and acidic juices such as orange juice.  NO MINT OR MENTHOL PRODUCTS SO NO COUGH DROPS  USE SUGARLESS CANDY INSTEAD (jolley ranchers or Stover's)  NO OIL BASED VITAMINS - use powdered substitutes.  Please schedule a follow up office visit in 4 weeks, sooner if needed with cxr and walking sats on return

## 2012-08-08 NOTE — Assessment & Plan Note (Addendum)
See pft's 07/20/12 See Ct  Chest 07/18/12 Diffuse T spine changes suggestive of AS vs osteoporosis but no sign lung findings  pft's are definitely more restrictive than obstructive but nothing really to offer in this regard x perhaps 02 with exercise.

## 2012-08-08 NOTE — Assessment & Plan Note (Signed)
-   PFT's  07/20/12 FEV1  1.77 (51%) ratio 68 and no change p B2,  DLCO 28 corrects to 46%   When respiratory symptoms begin or become refractory well after a patient reports complete smoking cessation,  Especially when this wasn't the case while they were smoking, a red flag is raised based on the work of Dr Primitivo Gauze which states:  if you quit smoking when your best day FEV1 is still well preserved it is highly unlikely you will progress to severe disease.  That is to say, once the smoking stops,  the symptoms should not suddenly erupt or markedly worsen.  If so, the differential diagnosis should include  obesity/deconditioning,  LPR/Reflux/Aspiration syndromes,  occult CHF, or  especially side effect of medications commonly used in this population.    Of these, the only obvious cause is related to either acid or non-acid reflux, though I would also make sure he's not anemic and bnp is < 100 excluding for all intents and purposes any degree of chf.  For now rec max gerd rx to see to what extent if any the cough or sob improve then return x 4 weeks with ex 02 sats .

## 2012-09-04 ENCOUNTER — Ambulatory Visit (INDEPENDENT_AMBULATORY_CARE_PROVIDER_SITE_OTHER): Payer: Medicare Other | Admitting: Internal Medicine

## 2012-09-04 ENCOUNTER — Encounter: Payer: Self-pay | Admitting: Internal Medicine

## 2012-09-04 VITALS — BP 150/70 | HR 86 | Temp 97.6°F | Ht 76.0 in | Wt 202.0 lb

## 2012-09-04 DIAGNOSIS — J4489 Other specified chronic obstructive pulmonary disease: Secondary | ICD-10-CM

## 2012-09-04 DIAGNOSIS — J449 Chronic obstructive pulmonary disease, unspecified: Secondary | ICD-10-CM

## 2012-09-04 DIAGNOSIS — J984 Other disorders of lung: Secondary | ICD-10-CM

## 2012-09-04 NOTE — Progress Notes (Signed)
Subjective:    Patient ID: Jose Colace Sr., male    DOB: [birth year]   MRN: [id number]    Brief patient profile:  12 yowm  Quit 1984 no resp problems then started with sob x 2010 referred 08/08/2012 to pulmonary clinic by Dr Dossie Arbour.   HPI 08/08/2012 1st pulmonary eval cc indolent min progressive   doe x 4 years first with hills and worsened where one flight feels sob but doesn't stop at top.  Also some pnds and throat clearing x sev years before sob. Some hb better on protonix after bfast. Has not tried inhalers yet rec Pantoprazole (protonix) 40 mg   Take 30-60 min before first meal of the day and Pepcid 20 mg one bedtime until return to office - this is the best way to tell whether stomach acid is contributing to your problem.   GERD diet    09/04/2012 f/u ov/[name] re mild doe/ no excess mucus  Chief Complaint  Patient presents with  . Follow-up    Pt states breathing and cough are unchanged since his last visit.       No obvious daytime variabilty or assoc   cp or chest tightness, subjective wheeze overt sinus or active hb symptoms. No unusual exp hx or h/o childhood pna/ asthma or knowledge of premature birth.   Sleeping ok without nocturnal  or early am exacerbation  of respiratory  c/o's or need for noct saba. Also denies any obvious fluctuation of symptoms with weather or environmental changes or other aggravating or alleviating factors except as outlined above   Current Medications, Allergies, Past Medical History, Past Surgical History, Family History, and Social History were reviewed in [employer] record.  ROS  The following are not active complaints unless bolded sore throat, dysphagia, dental problems, itching, sneezing,  nasal congestion or excess/ purulent secretions, ear ache,   fever, chills, sweats, unintended wt loss, pleuritic or exertional cp, hemoptysis,  orthopnea pnd or leg swelling, presyncope, palpitations, heartburn, abdominal  pain, anorexia, nausea, vomiting, diarrhea  or change in bowel or urinary habits, change in stools or urine, dysuria,hematuria,  rash, arthralgias, visual complaints, headache, numbness weakness or ataxia or problems with walking or coordination,  change in mood/affect or memory.              Objective:   Physical Exam  amb wm with raspy voice nad   09/04/2012        203  Wt Readings from Last 3 Encounters:  08/08/12 200 lb 6.4 oz (90.901 kg)  02/16/12 202 lb (91.627 kg)  10/28/11 203 lb 9.6 oz (92.352 kg)     HEENT mild turbinate edema.  Oropharynx no thrush or excess pnd or cobblestoning.  No JVD or cervical adenopathy. Mild accessory muscle hypertrophy. Trachea midline, nl thryroid. Chest was slt hyperinflated by percussion with diminished breath sounds and min increased exp time without wheeze. Hoover sign positive at  End  inspiration. Regular rate and rhythm without murmur gallop or rub or increase P2 or edema.  Abd: no hsm, nl excursion. Ext warm without cyanosis or clubbing.      Ct 07/18/12 reviewed ? Ankylosing spondylitis, no significant pulmonary findings     Assessment & Plan:   Outpatient Encounter Prescriptions as of 09/04/2012  Medication Sig Dispense Refill  . acetaminophen (TYLENOL) 325 MG tablet Take 325 mg by mouth every 6 (six) hours as needed. Pain/fever      . ARIPiprazole (ABILIFY) 2 MG tablet Take  2 mg by mouth every evening.      [name] aspirin EC 81 MG tablet Take 81 mg by mouth daily.      [name] atorvastatin (LIPITOR) 20 MG tablet Take 1 tablet by mouth every morning.       . cycloSPORINE (RESTASIS) 0.05 % ophthalmic emulsion Place 1 drop into both eyes 2 (two) times daily.      [name] FLUoxetine (PROZAC) 20 MG capsule Take 20 mg by mouth daily before breakfast.      . IRON PO Take 1 tablet by mouth 2 (two) times daily.      [name] SYNTHROID 150 MCG tablet Take 1 tablet by mouth every morning.       . triamterene-hydrochlorothiazide (DYAZIDE) 37.5-25 MG per capsule Take 1  tablet by mouth every morning.       . [DISCONTINUED] famotidine (PEPCID) 20 MG tablet One at bedtime  30 tablet  11  . [DISCONTINUED] pantoprazole (PROTONIX) 40 MG tablet Take 40 mg by mouth every morning.       No facility-administered encounter medications on file as of 09/04/2012.

## 2012-09-04 NOTE — Patient Instructions (Addendum)
Ok to stop the pepcid at this point  You have very minimal lung problems that do not require any medications at this point  To get the most out of exercise, you need to be continuously aware that you are short of breath, but never out of breath, for 30 minutes daily. As you improve, it will actually be easier for you to do the same amount of exercise  in  30 minutes so always push to the level where you are short of breath.     Pulmonary follow up is as needed

## 2012-09-05 NOTE — Assessment & Plan Note (Addendum)
-   PFT's  07/20/12 FEV1  1.77 (51%) ratio 68 and no change p B2,  DLCO 28 corrects to 46% - 09/04/2012  Walked RA x 3 laps @ 185 ft each stopped due to  No sob, no desats  Not convinced that his minimal airflow obst has anything at all to do with his symptoms but nothing else to offer at this point - ok to stop gerd rx as didn't help

## 2012-09-05 NOTE — Assessment & Plan Note (Signed)
See pft's 07/20/12 See Ct  Chest 07/18/12 Diffuse T spine changes suggestive of AS vs osteoporosis  Can't do anything about his spine but based on how well he did walking here today I have strongly encouraged him to begin more regular walking at a level where is he is a bit sob but never oob.  Pulmonary f/u can be prn

## 2012-09-25 ENCOUNTER — Other Ambulatory Visit: Payer: Self-pay | Admitting: Dermatology

## 2012-12-23 ENCOUNTER — Emergency Department (HOSPITAL_BASED_OUTPATIENT_CLINIC_OR_DEPARTMENT_OTHER): Payer: No Typology Code available for payment source

## 2012-12-23 ENCOUNTER — Emergency Department (HOSPITAL_BASED_OUTPATIENT_CLINIC_OR_DEPARTMENT_OTHER)
Admission: EM | Admit: 2012-12-23 | Discharge: 2012-12-23 | Disposition: A | Discharge status: Home / Self Care | Payer: No Typology Code available for payment source | Attending: Emergency Medicine | Admitting: Emergency Medicine

## 2012-12-23 ENCOUNTER — Encounter (HOSPITAL_BASED_OUTPATIENT_CLINIC_OR_DEPARTMENT_OTHER): Payer: Self-pay | Admitting: Emergency Medicine

## 2012-12-23 DIAGNOSIS — Z8551 Personal history of malignant neoplasm of bladder: Secondary | ICD-10-CM | POA: Insufficient documentation

## 2012-12-23 DIAGNOSIS — Y9389 Activity, other specified: Secondary | ICD-10-CM | POA: Insufficient documentation

## 2012-12-23 DIAGNOSIS — Z85828 Personal history of other malignant neoplasm of skin: Secondary | ICD-10-CM | POA: Insufficient documentation

## 2012-12-23 DIAGNOSIS — M129 Arthropathy, unspecified: Secondary | ICD-10-CM | POA: Insufficient documentation

## 2012-12-23 DIAGNOSIS — Z85850 Personal history of malignant neoplasm of thyroid: Secondary | ICD-10-CM | POA: Insufficient documentation

## 2012-12-23 DIAGNOSIS — Z7982 Long term (current) use of aspirin: Secondary | ICD-10-CM | POA: Insufficient documentation

## 2012-12-23 DIAGNOSIS — Z8719 Personal history of other diseases of the digestive system: Secondary | ICD-10-CM | POA: Insufficient documentation

## 2012-12-23 DIAGNOSIS — I10 Essential (primary) hypertension: Secondary | ICD-10-CM | POA: Insufficient documentation

## 2012-12-23 DIAGNOSIS — I2510 Atherosclerotic heart disease of native coronary artery without angina pectoris: Secondary | ICD-10-CM | POA: Insufficient documentation

## 2012-12-23 DIAGNOSIS — E785 Hyperlipidemia, unspecified: Secondary | ICD-10-CM | POA: Insufficient documentation

## 2012-12-23 DIAGNOSIS — E039 Hypothyroidism, unspecified: Secondary | ICD-10-CM | POA: Insufficient documentation

## 2012-12-23 DIAGNOSIS — Z87891 Personal history of nicotine dependence: Secondary | ICD-10-CM | POA: Insufficient documentation

## 2012-12-23 DIAGNOSIS — S20219A Contusion of unspecified front wall of thorax, initial encounter: Secondary | ICD-10-CM | POA: Insufficient documentation

## 2012-12-23 DIAGNOSIS — Z79899 Other long term (current) drug therapy: Secondary | ICD-10-CM | POA: Insufficient documentation

## 2012-12-23 DIAGNOSIS — Y92410 Unspecified street and highway as the place of occurrence of the external cause: Secondary | ICD-10-CM | POA: Insufficient documentation

## 2012-12-23 MED ORDER — OXYCODONE-ACETAMINOPHEN 5-325 MG PO TABS: 2.0000 | ORAL_TABLET | Freq: Four times a day (QID) | ORAL | Status: DC | PRN
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
## 2012-12-27 ENCOUNTER — Other Ambulatory Visit: Payer: Self-pay | Admitting: Internal Medicine

## 2012-12-27 ENCOUNTER — Ambulatory Visit
Admission: RE | Admit: 2012-12-27 | Discharge: 2012-12-27 | Disposition: A | Discharge status: Home / Self Care | Payer: Medicare Other | Source: Ambulatory Visit | Attending: Internal Medicine | Admitting: Internal Medicine

## 2012-12-27 DIAGNOSIS — R634 Abnormal weight loss: Secondary | ICD-10-CM

## 2012-12-27 DIAGNOSIS — R109 Unspecified abdominal pain: Secondary | ICD-10-CM

## 2012-12-27 MED ORDER — IOHEXOL 300 MG/ML  SOLN
100.0000 mL | Freq: Once | INTRAMUSCULAR | Status: AC | PRN
  Administered 2012-12-27: 100 mL via INTRAVENOUS

## 2013-01-02 ENCOUNTER — Encounter: Payer: Self-pay | Admitting: Gastroenterology

## 2013-01-12 ENCOUNTER — Other Ambulatory Visit: Payer: Self-pay

## 2013-01-16 ENCOUNTER — Other Ambulatory Visit: Payer: Self-pay | Admitting: Dermatology

## 2013-01-31 ENCOUNTER — Ambulatory Visit: Payer: Medicare Other | Admitting: Gastroenterology

## 2013-05-01 ENCOUNTER — Other Ambulatory Visit: Payer: Self-pay | Admitting: Dermatology

## 2013-05-30 ENCOUNTER — Emergency Department (HOSPITAL_COMMUNITY): Payer: Medicare Other

## 2013-05-30 ENCOUNTER — Emergency Department (HOSPITAL_COMMUNITY)
Admission: EM | Admit: 2013-05-30 | Discharge: 2013-05-31 | Disposition: A | Discharge status: Home / Self Care | Payer: Medicare Other | Attending: Emergency Medicine | Admitting: Emergency Medicine

## 2013-05-30 ENCOUNTER — Encounter (HOSPITAL_COMMUNITY): Payer: Self-pay | Admitting: Emergency Medicine

## 2013-05-30 DIAGNOSIS — Z8551 Personal history of malignant neoplasm of bladder: Secondary | ICD-10-CM | POA: Insufficient documentation

## 2013-05-30 DIAGNOSIS — Z85038 Personal history of other malignant neoplasm of large intestine: Secondary | ICD-10-CM | POA: Insufficient documentation

## 2013-05-30 DIAGNOSIS — I10 Essential (primary) hypertension: Secondary | ICD-10-CM | POA: Insufficient documentation

## 2013-05-30 DIAGNOSIS — Z85850 Personal history of malignant neoplasm of thyroid: Secondary | ICD-10-CM | POA: Insufficient documentation

## 2013-05-30 DIAGNOSIS — E785 Hyperlipidemia, unspecified: Secondary | ICD-10-CM | POA: Insufficient documentation

## 2013-05-30 DIAGNOSIS — E039 Hypothyroidism, unspecified: Secondary | ICD-10-CM | POA: Insufficient documentation

## 2013-05-30 DIAGNOSIS — Z87891 Personal history of nicotine dependence: Secondary | ICD-10-CM | POA: Insufficient documentation

## 2013-05-30 DIAGNOSIS — Z8739 Personal history of other diseases of the musculoskeletal system and connective tissue: Secondary | ICD-10-CM | POA: Insufficient documentation

## 2013-05-30 DIAGNOSIS — R1013 Epigastric pain: Secondary | ICD-10-CM

## 2013-05-30 DIAGNOSIS — K219 Gastro-esophageal reflux disease without esophagitis: Secondary | ICD-10-CM | POA: Insufficient documentation

## 2013-05-30 DIAGNOSIS — Z7982 Long term (current) use of aspirin: Secondary | ICD-10-CM | POA: Insufficient documentation

## 2013-05-30 DIAGNOSIS — I2510 Atherosclerotic heart disease of native coronary artery without angina pectoris: Secondary | ICD-10-CM | POA: Insufficient documentation

## 2013-05-30 DIAGNOSIS — Z79899 Other long term (current) drug therapy: Secondary | ICD-10-CM | POA: Insufficient documentation

## 2013-05-30 DIAGNOSIS — R42 Dizziness and giddiness: Secondary | ICD-10-CM | POA: Insufficient documentation

## 2013-05-30 DIAGNOSIS — Z85828 Personal history of other malignant neoplasm of skin: Secondary | ICD-10-CM | POA: Insufficient documentation

## 2013-05-30 DIAGNOSIS — R0602 Shortness of breath: Secondary | ICD-10-CM | POA: Insufficient documentation

## 2013-05-30 LAB — CBC
HEMATOCRIT: 47 % (ref 39.0–52.0)
HEMOGLOBIN: 15.9 g/dL (ref 13.0–17.0)
MCH: 31.9 pg (ref 26.0–34.0)
MCHC: 33.8 g/dL (ref 30.0–36.0)
MCV: 94.4 fL (ref 78.0–100.0)
Platelets: 272 10*3/uL (ref 150–400)
RBC: 4.98 MIL/uL (ref 4.22–5.81)
RDW: 14.7 % (ref 11.5–15.5)
WBC: 7.1 10*3/uL (ref 4.0–10.5)

## 2013-05-30 LAB — I-STAT TROPONIN, ED
TROPONIN I, POC: 0 ng/mL (ref 0.00–0.08)
Troponin i, poc: 0 ng/mL (ref 0.00–0.08)

## 2013-05-30 LAB — PRO B NATRIURETIC PEPTIDE: PRO B NATRI PEPTIDE: 59.6 pg/mL (ref 0–450)

## 2013-05-30 LAB — BASIC METABOLIC PANEL
BUN: 18 mg/dL (ref 6–23)
CO2: 30 meq/L (ref 19–32)
CREATININE: 1 mg/dL (ref 0.50–1.35)
Calcium: 9.3 mg/dL (ref 8.4–10.5)
Chloride: 98 mEq/L (ref 96–112)
GFR calc Af Amer: 77 mL/min — ABNORMAL LOW (ref >90)
GFR calc non Af Amer: 66 mL/min — ABNORMAL LOW (ref >90)
Glucose, Bld: 101 mg/dL — ABNORMAL HIGH (ref 70–99)
POTASSIUM: 4.4 meq/L (ref 3.7–5.3)
Sodium: 138 mEq/L (ref 137–147)

## 2013-05-30 LAB — D-DIMER, QUANTITATIVE (NOT AT ARMC): D-Dimer, Quant: <0.27 ug/mL-FEU (ref 0.00–0.48)

## 2013-05-30 MED ORDER — FAMOTIDINE 20 MG PO TABS: 20.0000 mg | ORAL_TABLET | Freq: Two times a day (BID) | ORAL | Status: DC

## 2013-05-30 MED ORDER — NITROGLYCERIN 0.4 MG SL SUBL
0.4000 mg | SUBLINGUAL_TABLET | SUBLINGUAL | Status: DC | PRN
  Administered 2013-05-30 (×3): 0.4 mg via SUBLINGUAL
  Filled 2013-05-30: qty 1

## 2013-05-30 MED ORDER — HYDROMORPHONE HCL PF 1 MG/ML IJ SOLN
0.5000 mg | Freq: Once | INTRAMUSCULAR | Status: AC
  Administered 2013-05-30: 0.5 mg via INTRAVENOUS
  Filled 2013-05-30: qty 1

## 2013-05-30 MED ORDER — SODIUM CHLORIDE 0.9 % IV SOLN
INTRAVENOUS | Status: DC
  Administered 2013-05-30: 75 mL/h via INTRAVENOUS

## 2013-05-30 MED ORDER — ONDANSETRON HCL 4 MG/2ML IJ SOLN
4.0000 mg | Freq: Once | INTRAMUSCULAR | Status: AC
  Administered 2013-05-30: 4 mg via INTRAVENOUS
  Filled 2013-05-30: qty 2

## 2013-05-30 MED ORDER — IOHEXOL 300 MG/ML  SOLN
100.0000 mL | Freq: Once | INTRAMUSCULAR | Status: AC | PRN
  Administered 2013-05-30: 100 mL via INTRAVENOUS

## 2013-05-30 MED ORDER — IOHEXOL 300 MG/ML  SOLN
25.0000 mL | INTRAMUSCULAR | Status: AC
  Administered 2013-05-30: 25 mL via ORAL

## 2013-05-30 NOTE — ED Notes (Signed)
Pt brought to ER from home by his wife with c/o pain 5/10 on his mid chest that he describe as tightness. Pt denies any radiation to arms, neck or back, no nausea or vomiting at this time. Pt only refers some SOB related to this pain. Pt refers he got a testosterone shot on the beginning of April this year. Pt AO x 4, VSS, no distress noticed.

## 2013-05-30 NOTE — Discharge Instructions (Signed)
Take the Pepcid twice a day for the next 2 weeks. If this helps the upper abdomen discomfort then you may want to contact [facility]. Make a plan to followup with your Dr. Sinclair Grooms baby aspirin a day. As we discussed return for any new or worse pain particularly any chest pain or any worse epigastric pain.

## 2013-05-30 NOTE — ED Provider Notes (Signed)
CSN: [id number]     Arrival date & time 05/30/13  3710 History   First MD Initiated Contact with Patient 05/30/13 [phone]     Chief Complaint  Patient presents with  . Chest Pain     (Consider location/radiation/quality/duration/timing/severity/associated sxs/prior Treatment) Patient is a 80 y.o. male presenting with chest pain. The history is provided by the patient and the spouse.  Chest Pain Associated symptoms: abdominal pain, dizziness and shortness of breath   Associated symptoms: no back pain, no fever, no headache and not vomiting    patient with discomfort in the epigastric part of his abdomen at about 6:30 in the morning. Described as a tightness. Nonradiating. No back pain. Associated with shortness of breath no nausea no vomiting no diaphoresis. Patient's wife gave him 4 baby aspirin prior arrival and brought him in. Also associated with a little bit of dizziness. Patient without history of cardiac problems. Gastrointestinal the past but no known cardiac disease. Patient primary care Dr. is Dr. Sharlett Iles. Pain is worse with 5/10. Improved now. No history of similar pain.  Past Medical History  Diagnosis Date  . Hypertension   . Hx of thyroid cancer     s/p total thyroidectomy,  . Hypothyroidism   . Hyperlipidemia   . 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 Coronary artery disease CARDIOLOGIST- DR Thompson Grayer    LAST VISIT NOTE 08-13-10 AND STRESS TEST 08-17-10 IN EPIC (LOW RISK ,  NO ISCHEMIA)  . History of basal cell carcinoma excision BILATERAL ARMS  . Arthritis     BACK  . GERD (gastroesophageal reflux disease)     CONTROLLED W/ PROTONIX  . Bladder tumor     CYSTO W/ BX SCHEDULED 12-15-10  . Nocturia   . Cancer     hx of bladder and thyroid cancer   . Complication of anesthesia 12/2010    told he had a "stiff neck" during intubation   . Tubular adenoma of colon 04/2002   Past Surgical History  Procedure Laterality Date  .  Total thyroidectomy  1991  . Cataract extraction w/ intraocular lens  implant, bilateral    . Cystoscopy with biopsy  12/16/2010    Procedure: CYSTOSCOPY WITH BIOPSY;  Surgeon: Molli Hazard, MD;  Location: [facility];  Service: Urology;  Laterality: N/A;  . Transurethral resection of bladder tumor  12/16/2010    Procedure: TRANSURETHRAL RESECTION OF BLADDER TUMOR (TURBT);  Surgeon: Molli Hazard, MD;  Location: [facility];  Service: Urology;  Laterality: N/A;  . Cystoscopy/retrograde/ureteroscopy  12/16/2010    Procedure: CYSTOSCOPY/RETROGRADE/URETEROSCOPY;  Surgeon: Molli Hazard, MD;  Location: [facility];  Service: Urology;  Laterality: Right;  bilateral retrograde, right ureteroscopy  . Eye surgery      attached lens to iris   Family History  Problem Relation Age of Onset  . Lung cancer Brother   . Thyroid cancer    . Diabetes Father    History  Substance Use Topics  . Smoking status: Former Smoker -- 2.00 packs/day for 35 years    Types: Cigarettes    Quit date: 01/11/1982  . Smokeless tobacco: Former Systems developer     Comment: quit 1983  . Alcohol Use: 0.0 oz/week     Comment: 6-oz vodka per day     Review of Systems  Constitutional: Negative for fever.  HENT: Negative for congestion.   Eyes: Negative for visual disturbance.  Respiratory: Positive for  shortness of breath.   Cardiovascular: Positive for chest pain.  Gastrointestinal: Positive for abdominal pain. Negative for vomiting.  Genitourinary: Negative for dysuria.  Musculoskeletal: Negative for back pain.  Skin: Negative for rash.  Neurological: Positive for dizziness and light-headedness. Negative for headaches.  Hematological: Does not bruise/bleed easily.  Psychiatric/Behavioral: Negative for confusion.      Allergies  Review of patient's allergies indicates no known allergies.  Home Medications   Prior to Admission medications   Medication  Sig Start Date End Date Taking? Authorizing Provider  Ascorbic Acid (VITAMIN C PO) Take 1 tablet by mouth daily.   Yes Historical Provider, MD  aspirin EC 81 MG tablet Take 81 mg by mouth daily.   Yes Historical Provider, MD  atorvastatin (LIPITOR) 20 MG tablet Take 1 tablet by mouth every morning.    Yes Historical Provider, MD  iron polysaccharides (NIFEREX) 150 MG capsule Take 150 mg by mouth daily.   Yes Historical Provider, MD  pantoprazole (PROTONIX) 40 MG tablet Take 40 mg by mouth daily.  12/21/12  Yes Historical Provider, MD  SYNTHROID 150 MCG tablet Take 1 tablet by mouth every morning.  06/11/10  Yes Historical Provider, MD  triamterene-hydrochlorothiazide (DYAZIDE) 37.5-25 MG per capsule Take 1 tablet by mouth daily.  06/11/10  Yes Historical Provider, MD  Vitamin D, Ergocalciferol, (DRISDOL) 50000 UNITS CAPS capsule Take 50,000 Units by mouth every 7 (seven) days. mondays   Yes Historical Provider, MD  famotidine (PEPCID) 20 MG tablet Take 1 tablet (20 mg total) by mouth 2 (two) times daily. 05/30/13   Mervin Kung, MD   BP 153/61  Pulse 70  Temp(Src) 98 F (36.7 C)  Resp 12  SpO2 100% Physical Exam  Nursing note and vitals reviewed. Constitutional: He is oriented to person, place, and time. He appears well-developed and well-nourished. No distress.  HENT:  Head: Normocephalic and atraumatic.  Mouth/Throat: Oropharynx is clear and moist.  Eyes: Conjunctivae and EOM are normal. Pupils are equal, round, and reactive to light.  Neck: Normal range of motion. Neck supple.  Cardiovascular: Normal rate, regular rhythm and normal heart sounds.   No murmur heard. Pulmonary/Chest: Effort normal and breath sounds normal.  Abdominal: Soft. Bowel sounds are normal. There is no tenderness.  Musculoskeletal: Normal range of motion. He exhibits no edema and no tenderness.  Neurological: He is alert and oriented to person, place, and time. No cranial nerve deficit. He exhibits normal  muscle tone. Coordination normal.  Skin: Skin is warm. No rash noted.    ED Course  Procedures (including critical care time) Labs Review Labs Reviewed  BASIC METABOLIC PANEL - Abnormal; Notable for the following:    Glucose, Bld 101 (*)    GFR calc non Af Amer 66 (*)    GFR calc Af Amer 77 (*)    All other components within normal limits  CBC  PRO B NATRIURETIC PEPTIDE  D-DIMER, QUANTITATIVE  I-STAT TROPOININ, ED  I-STAT TROPOININ, ED   Results for orders placed during the hospital encounter of 05/30/13  CBC      Result Value Ref Range   WBC 7.1  4.0 - 10.5 K/uL   RBC 4.98  4.22 - 5.81 MIL/uL   Hemoglobin 15.9  13.0 - 17.0 g/dL   HCT 47.0  39.0 - 52.0 %   MCV 94.4  78.0 - 100.0 fL   MCH 31.9  26.0 - 34.0 pg   MCHC 33.8  30.0 - 36.0 g/dL   RDW  14.7  11.5 - 15.5 %   Platelets 272  150 - 400 K/uL  BASIC METABOLIC PANEL      Result Value Ref Range   Sodium 138  137 - 147 mEq/L   Potassium 4.4  3.7 - 5.3 mEq/L   Chloride 98  96 - 112 mEq/L   CO2 30  19 - 32 mEq/L   Glucose, Bld 101 (*) 70 - 99 mg/dL   BUN 18  6 - 23 mg/dL   Creatinine, Ser 1.00  0.50 - 1.35 mg/dL   Calcium 9.3  8.4 - 10.5 mg/dL   GFR calc non Af Amer 66 (*) >90 mL/min   GFR calc Af Amer 77 (*) >90 mL/min  PRO B NATRIURETIC PEPTIDE      Result Value Ref Range   Pro B Natriuretic peptide (BNP) 59.6  0 - 450 pg/mL  D-DIMER, QUANTITATIVE      Result Value Ref Range   D-Dimer, Quant <0.27  0.00 - 0.48 ug/mL-FEU  I-STAT TROPOININ, ED      Result Value Ref Range   Troponin i, poc 0.00  0.00 - 0.08 ng/mL   Comment 3           I-STAT TROPOININ, ED      Result Value Ref Range   Troponin i, poc 0.00  0.00 - 0.08 ng/mL   Comment 3              Imaging Review Dg Chest 2 View  05/30/2013   CLINICAL DATA:  Chest pain, heaviness  EXAM: CHEST  2 VIEW  COMPARISON:  CT ABD/PELVIS W CM dated 12/27/2012; DG CHEST 2 VIEW dated 12/23/2012; DG CHEST 2 VIEW dated 10/28/2011  FINDINGS: There are mild chronic  bronchitic changes. There is no focal parenchymal opacity, pleural effusion, or pneumothorax. The heart and mediastinal contours are unremarkable.  There are surgical clips in the lower neck likely from prior thyroidectomy.  There is mild thoracic spine spondylosis.  IMPRESSION: No active cardiopulmonary disease.   Electronically Signed   By: Kathreen Devoid   On: 05/30/2013 09:52   Ct Abdomen Pelvis W Contrast  05/30/2013   CLINICAL DATA:  Epigastric abdominal pain.  EXAM: CT ABDOMEN AND PELVIS WITH CONTRAST  TECHNIQUE: Multidetector CT imaging of the abdomen and pelvis was performed using the standard protocol following bolus administration of intravenous contrast.  CONTRAST:  18mL OMNIPAQUE IOHEXOL 300 MG/ML  SOLN  COMPARISON:  12/27/2012.  FINDINGS: Minimal diffuse low density of the liver relative to the spleen. Normal appearing spleen, pancreas, gallbladder and adrenal glands. Anterior bladder diverticula and tiny amount of air in the urinary bladder, suggesting recent Foley catheterization. Small a TUR defect in the prostate gland.  Multiple sigmoid colon diverticula without evidence of diverticulitis. No evidence appendicitis. No gastric or small bowel abnormalities. No enlarged lymph nodes. Atheromatous arterial calcifications. Right inguinal hernia containing fat. Minimal dependent atelectasis at the right lung base.  Multiple bilateral renal cysts. The previously demonstrated 4.4 cm septated cyst in the lower pole of the left kidney currently measures 4.8 cm in maximum diameter on image 17. No nodular components are seen. Extensive lumbar and lower thoracic spine degenerative changes, including changes of DISH.  IMPRESSION: 1. No acute abnormality. 2. Minimal diffuse hepatic steatosis. 3. Small anterior bladder diverticula. 4. Right inguinal hernia containing fat. 5. Sigmoid diverticulosis.   Electronically Signed   By: Enrique Sack M.D.   On: 05/30/2013 13:02     EKG Interpretation  Date/Time:   Wednesday May 30 2013 08:13:59 EDT Ventricular Rate:  79 PR Interval:  200 QRS Duration: 146 QT Interval:  416 QTC Calculation: 477 R Axis:   128 Text Interpretation:  Suspect arm lead reversal, interpretation  assumes no reversal Normal sinus rhythm Right bundle branch block Left  posterior fascicular block  Bifascicular block  Abnormal ECG Sinus  rhythm Intraventricular conduction delay (likely RBBB ) Abnormal ekg  Confirmed by Carmin Muskrat  MD ([id number]) on 05/30/2013 8:23:57 AM      MDM   Final diagnoses:  Epigastric abdominal pain    The patient evaluation for epigastric abdominal pain started at 6:30 this morning. Patient without any known cardiac history. Workup chest x-ray shows no evidence of pulmonary edema pneumonia or pneumothorax. D-dimer was not elevated so therefore not likely consistent with a dissection or pulmonary embolus. Patient had troponins x2 both negative. EKG without any acute changes. Patient's epigastric tightness improved but not completely resolved. Abdominal CT was negative for any acute findings no evidence of any gallbladder problem diverticulitis or pancreas problems. Patient's electrolytes without significant abnormalities. Also no evidence of any gallstones.  Clinically we'll treat with Pepcid for the next 2 weeks. Doubt that this is cardiac in nature. Patient will continue his baby aspirin a day. Followup with his record Dr. for further evaluation. Return for any new or worse symptoms.    Mervin Kung, MD 05/30/13 [phone]

## 2013-05-30 NOTE — ED Notes (Signed)
Per pt chest pain that woke him up this am with SOB, dizziness, unstable on his feet. Denies radiation.

## 2013-07-31 ENCOUNTER — Other Ambulatory Visit: Payer: Self-pay | Admitting: Dermatology

## 2013-10-30 ENCOUNTER — Other Ambulatory Visit: Payer: Self-pay | Admitting: Dermatology

## 2013-12-31 ENCOUNTER — Encounter: Payer: Self-pay | Admitting: Gastroenterology

## 2014-01-28 ENCOUNTER — Other Ambulatory Visit: Payer: Self-pay | Admitting: Dermatology

## 2014-05-01 ENCOUNTER — Other Ambulatory Visit: Payer: Self-pay | Admitting: Dermatology

## 2014-06-16 ENCOUNTER — Encounter (HOSPITAL_COMMUNITY): Payer: Self-pay | Admitting: Neurology

## 2014-06-16 ENCOUNTER — Emergency Department (HOSPITAL_COMMUNITY): Payer: Medicare Other

## 2014-06-16 ENCOUNTER — Other Ambulatory Visit (HOSPITAL_COMMUNITY): Payer: Self-pay

## 2014-06-16 ENCOUNTER — Observation Stay (HOSPITAL_COMMUNITY)
Admission: EM | Admit: 2014-06-16 | Discharge: 2014-06-18 | Disposition: A | Discharge status: Home / Self Care | Payer: Medicare Other | Attending: Internal Medicine | Admitting: Internal Medicine

## 2014-06-16 DIAGNOSIS — Z87891 Personal history of nicotine dependence: Secondary | ICD-10-CM | POA: Diagnosis not present

## 2014-06-16 DIAGNOSIS — E039 Hypothyroidism, unspecified: Secondary | ICD-10-CM | POA: Diagnosis present

## 2014-06-16 DIAGNOSIS — K219 Gastro-esophageal reflux disease without esophagitis: Secondary | ICD-10-CM | POA: Diagnosis present

## 2014-06-16 DIAGNOSIS — Z86010 Personal history of colonic polyps: Secondary | ICD-10-CM | POA: Insufficient documentation

## 2014-06-16 DIAGNOSIS — J984 Other disorders of lung: Secondary | ICD-10-CM | POA: Diagnosis not present

## 2014-06-16 DIAGNOSIS — R1013 Epigastric pain: Secondary | ICD-10-CM | POA: Insufficient documentation

## 2014-06-16 DIAGNOSIS — Z7982 Long term (current) use of aspirin: Secondary | ICD-10-CM | POA: Diagnosis not present

## 2014-06-16 DIAGNOSIS — E785 Hyperlipidemia, unspecified: Secondary | ICD-10-CM | POA: Diagnosis not present

## 2014-06-16 DIAGNOSIS — M479 Spondylosis, unspecified: Secondary | ICD-10-CM | POA: Diagnosis not present

## 2014-06-16 DIAGNOSIS — I10 Essential (primary) hypertension: Secondary | ICD-10-CM | POA: Diagnosis not present

## 2014-06-16 DIAGNOSIS — E780 Pure hypercholesterolemia: Secondary | ICD-10-CM | POA: Diagnosis not present

## 2014-06-16 DIAGNOSIS — K210 Gastro-esophageal reflux disease with esophagitis: Secondary | ICD-10-CM

## 2014-06-16 DIAGNOSIS — R079 Chest pain, unspecified: Principal | ICD-10-CM | POA: Diagnosis present

## 2014-06-16 DIAGNOSIS — Z8551 Personal history of malignant neoplasm of bladder: Secondary | ICD-10-CM | POA: Insufficient documentation

## 2014-06-16 DIAGNOSIS — R0602 Shortness of breath: Secondary | ICD-10-CM | POA: Insufficient documentation

## 2014-06-16 DIAGNOSIS — Z85850 Personal history of malignant neoplasm of thyroid: Secondary | ICD-10-CM | POA: Insufficient documentation

## 2014-06-16 DIAGNOSIS — I2510 Atherosclerotic heart disease of native coronary artery without angina pectoris: Secondary | ICD-10-CM | POA: Diagnosis not present

## 2014-06-16 DIAGNOSIS — Z79899 Other long term (current) drug therapy: Secondary | ICD-10-CM | POA: Diagnosis not present

## 2014-06-16 LAB — CBC WITH DIFFERENTIAL/PLATELET
BASOS PCT: 0 % (ref 0–1)
Basophils Absolute: 0 10*3/uL (ref 0.0–0.1)
Eosinophils Absolute: 0.4 10*3/uL (ref 0.0–0.7)
Eosinophils Relative: 6 % — ABNORMAL HIGH (ref 0–5)
HEMATOCRIT: 46 % (ref 39.0–52.0)
Hemoglobin: 15.3 g/dL (ref 13.0–17.0)
Lymphocytes Relative: 11 % — ABNORMAL LOW (ref 12–46)
Lymphs Abs: 0.7 10*3/uL (ref 0.7–4.0)
MCH: 31.5 pg (ref 26.0–34.0)
MCHC: 33.3 g/dL (ref 30.0–36.0)
MCV: 94.8 fL (ref 78.0–100.0)
MONO ABS: 0.6 10*3/uL (ref 0.1–1.0)
Monocytes Relative: 9 % (ref 3–12)
Neutro Abs: 4.7 10*3/uL (ref 1.7–7.7)
Neutrophils Relative %: 74 % (ref 43–77)
PLATELETS: 257 10*3/uL (ref 150–400)
RBC: 4.85 MIL/uL (ref 4.22–5.81)
RDW: 13.8 % (ref 11.5–15.5)
WBC: 6.4 10*3/uL (ref 4.0–10.5)

## 2014-06-16 LAB — BASIC METABOLIC PANEL
ANION GAP: 11 (ref 5–15)
BUN: 16 mg/dL (ref 6–20)
CO2: 27 mmol/L (ref 22–32)
Calcium: 9.2 mg/dL (ref 8.9–10.3)
Chloride: 103 mmol/L (ref 101–111)
Creatinine, Ser: 1.15 mg/dL (ref 0.61–1.24)
GFR, EST NON AFRICAN AMERICAN: 56 mL/min — AB (ref >60)
GLUCOSE: 89 mg/dL (ref 65–99)
Potassium: 4.2 mmol/L (ref 3.5–5.1)
SODIUM: 141 mmol/L (ref 135–145)

## 2014-06-16 LAB — I-STAT TROPONIN, ED: Troponin i, poc: 0 ng/mL (ref 0.00–0.08)

## 2014-06-16 LAB — TSH: TSH: 3.757 u[IU]/mL (ref 0.350–4.500)

## 2014-06-16 LAB — D-DIMER, QUANTITATIVE: D-Dimer, Quant: <0.27 ug/mL-FEU (ref 0.00–0.48)

## 2014-06-16 LAB — TROPONIN I

## 2014-06-16 MED ORDER — HEPARIN SODIUM (PORCINE) 5000 UNIT/ML IJ SOLN
5000.0000 [IU] | Freq: Three times a day (TID) | INTRAMUSCULAR | Status: DC
  Administered 2014-06-16 – 2014-06-18 (×5): 5000 [IU] via SUBCUTANEOUS
  Filled 2014-06-16 (×5): qty 1

## 2014-06-16 MED ORDER — LOSARTAN POTASSIUM 50 MG PO TABS
50.0000 mg | ORAL_TABLET | Freq: Every day | ORAL | Status: DC
  Administered 2014-06-16: 50 mg via ORAL
  Filled 2014-06-16: qty 1

## 2014-06-16 MED ORDER — ASPIRIN EC 81 MG PO TBEC
81.0000 mg | DELAYED_RELEASE_TABLET | Freq: Every day | ORAL | Status: DC
  Administered 2014-06-17 – 2014-06-18 (×2): 81 mg via ORAL
  Filled 2014-06-16 (×3): qty 1

## 2014-06-16 MED ORDER — ONDANSETRON HCL 4 MG/2ML IJ SOLN: 4.0000 mg | Freq: Four times a day (QID) | INTRAMUSCULAR | Status: DC | PRN

## 2014-06-16 MED ORDER — ACETAMINOPHEN 325 MG PO TABS
650.0000 mg | ORAL_TABLET | ORAL | Status: DC | PRN
  Administered 2014-06-17: 650 mg via ORAL
  Filled 2014-06-16: qty 2

## 2014-06-16 MED ORDER — ASPIRIN 81 MG PO CHEW
324.0000 mg | CHEWABLE_TABLET | Freq: Once | ORAL | Status: AC
  Administered 2014-06-16: 324 mg via ORAL
  Filled 2014-06-16: qty 4

## 2014-06-16 MED ORDER — TRIAMTERENE-HCTZ 37.5-25 MG PO CAPS
1.0000 | ORAL_CAPSULE | Freq: Every day | ORAL | Status: DC
  Administered 2014-06-17 – 2014-06-18 (×2): 1 via ORAL
  Filled 2014-06-16 (×3): qty 1

## 2014-06-16 MED ORDER — PANTOPRAZOLE SODIUM 40 MG PO TBEC
40.0000 mg | DELAYED_RELEASE_TABLET | Freq: Two times a day (BID) | ORAL | Status: DC
Start: 2014-06-16 — End: 2014-06-18
  Administered 2014-06-16 – 2014-06-18 (×5): 40 mg via ORAL
  Filled 2014-06-16 (×5): qty 1

## 2014-06-16 MED ORDER — LEVOTHYROXINE SODIUM 75 MCG PO TABS
150.0000 ug | ORAL_TABLET | Freq: Every day | ORAL | Status: DC
  Administered 2014-06-17 – 2014-06-18 (×2): 150 ug via ORAL
  Filled 2014-06-16 (×3): qty 2

## 2014-06-16 MED ORDER — SUCRALFATE 1 GM/10ML PO SUSP
1.0000 g | Freq: Three times a day (TID) | ORAL | Status: DC
  Administered 2014-06-16 – 2014-06-18 (×6): 1 g via ORAL
  Filled 2014-06-16 (×6): qty 10

## 2014-06-16 MED ORDER — NITROGLYCERIN 0.4 MG SL SUBL
0.4000 mg | SUBLINGUAL_TABLET | SUBLINGUAL | Status: AC | PRN
  Administered 2014-06-16 (×3): 0.4 mg via SUBLINGUAL
  Filled 2014-06-16: qty 1

## 2014-06-16 MED ORDER — ATORVASTATIN CALCIUM 20 MG PO TABS
20.0000 mg | ORAL_TABLET | Freq: Every day | ORAL | Status: DC
  Administered 2014-06-16 – 2014-06-17 (×2): 20 mg via ORAL
  Filled 2014-06-16 (×2): qty 1

## 2014-06-16 MED ORDER — SODIUM CHLORIDE 0.9 % IV SOLN: INTRAVENOUS | Status: DC

## 2014-06-16 MED ORDER — GI COCKTAIL ~~LOC~~
30.0000 mL | Freq: Two times a day (BID) | ORAL | Status: DC | PRN
  Administered 2014-06-16: 30 mL via ORAL
  Filled 2014-06-16: qty 30

## 2014-06-16 MED ORDER — GI COCKTAIL ~~LOC~~: 30.0000 mL | Freq: Three times a day (TID) | ORAL | Status: DC | PRN

## 2014-06-16 MED ORDER — ONDANSETRON HCL 4 MG/2ML IJ SOLN
4.0000 mg | Freq: Three times a day (TID) | INTRAMUSCULAR | Status: DC | PRN
Start: 2014-06-16 — End: 2014-06-16

## 2014-06-16 MED ORDER — FAMOTIDINE 20 MG PO TABS
20.0000 mg | ORAL_TABLET | Freq: Every day | ORAL | Status: DC
  Administered 2014-06-16 – 2014-06-17 (×2): 20 mg via ORAL
  Filled 2014-06-16 (×2): qty 1

## 2014-06-16 MED ORDER — MORPHINE SULFATE 2 MG/ML IJ SOLN: 2.0000 mg | INTRAMUSCULAR | Status: DC | PRN

## 2014-06-16 NOTE — H&P (Signed)
[facility] History and Physical  Jose RENNERT Sr. VVO:[id number] DOB: 01-14-1925 DOA: 06/16/2014  Referring physician: Dr. Jeanell Sparrow PCP: Donnajean Lopes, MD   Chief Complaint: chest pain  HPI: Jose Burly Tailor Sr. is a 81 y.o. male the past medical history significant for hypertension, hypothyroidism, hyperlipidemia, history of right bundle branch block, restrictive lung disease (actively followed by Dr.Wert) and GERD; who presented to Ed with complaints of CP. Patient reports pain has been presented over the last 16 hours, localized in mid-chest/epigastric area, some what worse with exertion, non radiated and with associated SOB. Pain improved after ASA and NTG given in ED. Patient endorses having similar symptoms in the past and essentially found to be secondary to GERD. currently he denies palpitations, diaphoresis, fever, chills, nausea, vomiting, dysuria, hematuria, melena, hematochezia or any other complaints.  In ED initial troponin neg, non-specific T waves inversions on inferior leads (new) and no acute infiltrates on CXR. Patient heart score is 4, and given risk factors, [facility] called to admit patient for ACS r/o.   Review of Systems:  Negative except as otherwise mentioned in history of present illness.  Past Medical History  Diagnosis Date  . Hypertension   . Hx of thyroid cancer     s/p total thyroidectomy,  . Hypothyroidism   . Hyperlipidemia   . 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 Coronary artery disease CARDIOLOGIST- DR Thompson Grayer    LAST VISIT NOTE 08-13-10 AND STRESS TEST 08-17-10 IN EPIC (LOW RISK ,  NO ISCHEMIA)  . History of basal cell carcinoma excision BILATERAL ARMS  . Arthritis     BACK  . GERD (gastroesophageal reflux disease)     CONTROLLED W/ PROTONIX  . Bladder tumor     CYSTO W/ BX SCHEDULED 12-15-10  . Nocturia   . Cancer     hx of bladder and thyroid cancer   . Complication of anesthesia 12/2010   told he had a "stiff neck" during intubation   . Tubular adenoma of colon 04/2002   Past Surgical History  Procedure Laterality Date  . Total thyroidectomy  1991  . Cataract extraction w/ intraocular lens  implant, bilateral    . Cystoscopy with biopsy  12/16/2010    Procedure: CYSTOSCOPY WITH BIOPSY;  Surgeon: Molli Hazard, MD;  Location: [facility];  Service: Urology;  Laterality: N/A;  . Transurethral resection of bladder tumor  12/16/2010    Procedure: TRANSURETHRAL RESECTION OF BLADDER TUMOR (TURBT);  Surgeon: Molli Hazard, MD;  Location: [facility];  Service: Urology;  Laterality: N/A;  . Cystoscopy/retrograde/ureteroscopy  12/16/2010    Procedure: CYSTOSCOPY/RETROGRADE/URETEROSCOPY;  Surgeon: Molli Hazard, MD;  Location: [facility];  Service: Urology;  Laterality: Right;  bilateral retrograde, right ureteroscopy  . Eye surgery      attached lens to iris   Social History:  reports that he quit smoking about 32 years ago. His smoking use included Cigarettes. He has a 70 pack-year smoking history. He has quit using smokeless tobacco. He reports that he drinks alcohol. He reports that he does not use illicit drugs.  No Known Allergies  Family History  Problem Relation Age of Onset  . Lung cancer Brother   . Thyroid cancer    . Diabetes Father     Prior to Admission medications   Medication Sig Start Date End Date Taking? Authorizing Provider  aspirin EC 81 MG tablet Take 81 mg by  mouth daily.   Yes Historical Provider, MD  atorvastatin (LIPITOR) 20 MG tablet Take 1 tablet by mouth every morning.    Yes Historical Provider, MD  losartan (COZAAR) 50 MG tablet Take 50 mg by mouth daily.   Yes Historical Provider, MD  pantoprazole (PROTONIX) 40 MG tablet Take 40 mg by mouth daily.  12/21/12  Yes Historical Provider, MD  SYNTHROID 150 MCG tablet Take 1 tablet by mouth every morning.  06/11/10  Yes Historical  Provider, MD  testosterone enanthate (DELATESTRYL) 200 MG/ML injection Inject 200 mg into the muscle every 28 (twenty-eight) days. For IM use only   Yes Historical Provider, MD  Ascorbic Acid (VITAMIN C PO) Take 1 tablet by mouth daily.    Historical Provider, MD  famotidine (PEPCID) 20 MG tablet Take 1 tablet (20 mg total) by mouth 2 (two) times daily. 05/30/13   Fredia Sorrow, MD  iron polysaccharides (NIFEREX) 150 MG capsule Take 150 mg by mouth daily.    Historical Provider, MD  triamterene-hydrochlorothiazide (DYAZIDE) 37.5-25 MG per capsule Take 1 tablet by mouth daily.  06/11/10   Historical Provider, MD  Vitamin D, Ergocalciferol, (DRISDOL) 50000 UNITS CAPS capsule Take 50,000 Units by mouth every 7 (seven) days. mondays    Historical Provider, MD   Physical Exam: Filed Vitals:   06/16/14 1118 06/16/14 1145 06/16/14 1215 06/16/14 1238  BP: 156/62 158/66 150/71 151/61  Pulse: 60 60 62 58  Temp:    98 F (36.7 C)  TempSrc:    Oral  Resp: 15 15 16 16   Height:    6\' 4"  (1.93 m)  Weight:    89.404 kg (197 lb 1.6 oz)  SpO2: 93% 96% 97% 98%    Wt Readings from Last 3 Encounters:  06/16/14 89.404 kg (197 lb 1.6 oz)  09/04/12 91.627 kg (202 lb)  08/08/12 90.901 kg (200 lb 6.4 oz)    General:  Appears calm and comfortable, reports just some soreness in his lower mid chest approximate 2 out of 10 in intensity. No fever, denies palpitations; patient was able to follow commands and he is alert, awake and oriented 3. Eyes: PERRL, normal lids, irises & conjunctiva, no icterus, no nystagmus ENT: grossly normal hearing, lips & tongue; no erythema, no exudates, no thrush; no drainage out of his ears or nostrils. Neck: no LAD, masses or thyromegaly, no JVD Cardiovascular: RRR, no, no rubs. No LE edema. Respiratory: CTA bilaterally, no w/r/r. Normal respiratory effort. Abdomen: soft, mid epigastric soreness, and no distension. Positive BS Skin: no rash or induration seen on limited  exam Musculoskeletal: grossly normal tone BUE/BLE Psychiatric: grossly normal mood and affect, speech fluent and appropriate Neurologic: grossly non-focal.          Labs on Admission:  Basic Metabolic Panel:  Recent Labs Lab 06/16/14 0930  NA 141  K 4.2  CL 103  CO2 27  GLUCOSE 89  BUN 16  CREATININE 1.15  CALCIUM 9.2   CBC:  Recent Labs Lab 06/16/14 0930  WBC 6.4  NEUTROABS 4.7  HGB 15.3  HCT 46.0  MCV 94.8  PLT 257   Radiological Exams on Admission: Dg Chest 2 View  06/16/2014   CLINICAL DATA:  Substernal chest pain and shortness of breath beginning yesterday. Personal history of hypertension.  EXAM: CHEST - 2 VIEW  COMPARISON:  None.  FINDINGS: The heart size is normal. Emphysematous changes are again noted. No focal airspace disease is present. There is no edema or effusion to suggest  failure. Atherosclerotic calcifications are present at the aortic arch. Thyroidectomy is noted.  IMPRESSION: 1. No acute cardiopulmonary disease. 2. Emphysema. 3. Atherosclerosis.   Electronically Signed   By: San Morelle M.D.   On: 06/16/2014 10:08    EKG:  Normal sinus rhythm, inverted T-waves in inferior leads; positiverightbundlebranchblockandleftposteriorfascicularblock  Assessment/Plan 1-Chest pain: Localized in low mid-chest/epigastric area. Improved with NTG and worse wi patient also described associated shortness of breath with pain. -Patient will be admitted to telemetry  -Will continue aspirin, statins and Cozaar -No beta blockers secondary to bradycardia  -Will cycle troponins and EKG -Will check 2-D echo -As needed oxygen supplementation  -Dependent results which in my need cardiology consultation to help decide inpatient versus outpatient stress test workup.  -Heart score 4 . -will use PPI and also carafate as part of his hx are very indicative of GERD as well  2-HTN (hypertension): fairly stable. -will continue current antihypertensive agents -will  follow VS and adjust meds as needed  3-HLD: will check lipid panel -will continue statins  4-Chronic restrictive lung disease: continue outpatient follow up with Dr. Melvyn Novas -slight SOB with activity -no wheezing  5-GERD (gastroesophageal reflux disease):will continue pepcid QHS; will add carafate and continue protonix but, will change dose to BID  6-Hypothyroidism: continue synthroid -will check TSH   Code Status: Full code DVT Prophylaxis: Heparin Family Communication: Wife at bedside Disposition Plan: Observation, telemetry bed, LOS < 2 midnights   Time spent: 60 minutes  [place], [facility] Pager [phone]

## 2014-06-16 NOTE — Progress Notes (Signed)
Pt states he stopped taking the triamterene-HCTZ medicine about 2 months ago and began losartan, as prescribed per PCP. Will notify MD, as this med is ordered here.

## 2014-06-16 NOTE — ED Notes (Signed)
Admitting MD at bedside.

## 2014-06-16 NOTE — ED Notes (Signed)
Pt reports epigastric/lower mid cp since yesterday. Denies n/v/diaphoresis. States some sob. Describes as feeling of being hit, now feeling sore.

## 2014-06-16 NOTE — ED Notes (Signed)
Pt remains monitored by blood pressure, pulse ox, and 5 lead. pts family remains at bedside.  

## 2014-06-16 NOTE — ED Provider Notes (Signed)
CSN: [id number]     Arrival date & time 06/16/14  5176 History   First MD Initiated Contact with Patient 06/16/14 [phone]     Chief Complaint  Patient presents with  . Chest Pain     (Consider location/radiation/quality/duration/timing/severity/associated sxs/prior Treatment) HPI Comments: Patient with h/o HTN, high cholesterol, former smoker -- presents with c/o epigastric pain. Started yesterday evening. States that he felt like someone hit him just below the breastbone and now has 5/10 'soreness'. No pain with exertion. No recent sx other than 'feeling full' and having constipation. He has some subjective SOB as well, worse than baseline with exertion. No diaphoresis, nausea, vomiting, palpitations. No ASA today. Has not taken BP meds this morning. Patient denies risk factors for pulmonary embolism including: unilateral leg swelling, history of DVT/PE/other blood clots, recent immobilizations, recent surgery, recent travel (>4hr segment), hemoptysis. No CHF history. Does have have h/o bladder and thyroid CA which have been treated.   Evaluated by Dr. Melvyn Novas in past for SOB, has element of restrictive > obstructive lung disease.   Evaluated by cardiology in 2012 with Myoview. Was treated with protonix at that time and this seemed to help. Lexiscan myoview from 08/17/10 reveals low risk stress nuclear study with prominent inferobasal thinning vs small prior infact; no ischemia.    Patient is a 81 y.o. male presenting with chest pain. The history is provided by the patient and medical records.  Chest Pain Associated symptoms: shortness of breath   Associated symptoms: no abdominal pain, no back pain, no cough, no diaphoresis, no fever, no nausea, no palpitations and not vomiting     Past Medical History  Diagnosis Date  . Hypertension   . Hx of thyroid cancer     s/p total thyroidectomy,  . Hypothyroidism   . Hyperlipidemia   . 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 Coronary artery disease CARDIOLOGIST- DR Thompson Grayer    LAST VISIT NOTE 08-13-10 AND STRESS TEST 08-17-10 IN EPIC (LOW RISK ,  NO ISCHEMIA)  . History of basal cell carcinoma excision BILATERAL ARMS  . Arthritis     BACK  . GERD (gastroesophageal reflux disease)     CONTROLLED W/ PROTONIX  . Bladder tumor     CYSTO W/ BX SCHEDULED 12-15-10  . Nocturia   . Cancer     hx of bladder and thyroid cancer   . Complication of anesthesia 12/2010    told he had a "stiff neck" during intubation   . Tubular adenoma of colon 04/2002   Past Surgical History  Procedure Laterality Date  . Total thyroidectomy  1991  . Cataract extraction w/ intraocular lens  implant, bilateral    . Cystoscopy with biopsy  12/16/2010    Procedure: CYSTOSCOPY WITH BIOPSY;  Surgeon: Molli Hazard, MD;  Location: [facility];  Service: Urology;  Laterality: N/A;  . Transurethral resection of bladder tumor  12/16/2010    Procedure: TRANSURETHRAL RESECTION OF BLADDER TUMOR (TURBT);  Surgeon: Molli Hazard, MD;  Location: [facility];  Service: Urology;  Laterality: N/A;  . Cystoscopy/retrograde/ureteroscopy  12/16/2010    Procedure: CYSTOSCOPY/RETROGRADE/URETEROSCOPY;  Surgeon: Molli Hazard, MD;  Location: [facility];  Service: Urology;  Laterality: Right;  bilateral retrograde, right ureteroscopy  . Eye surgery      attached lens to iris   Family History  Problem Relation Age of Onset  . Lung cancer Brother   .  Thyroid cancer    . Diabetes Father    History  Substance Use Topics  . Smoking status: Former Smoker -- 2.00 packs/day for 35 years    Types: Cigarettes    Quit date: 01/11/1982  . Smokeless tobacco: Former Systems developer     Comment: quit 1983  . Alcohol Use: 0.0 oz/week     Comment: 6-oz vodka per day     Review of Systems  Constitutional: Negative for fever and diaphoresis.  Eyes: Negative for redness.  Respiratory:  Positive for shortness of breath. Negative for cough and wheezing.   Cardiovascular: Positive for chest pain. Negative for palpitations and leg swelling.  Gastrointestinal: Negative for nausea, vomiting and abdominal pain.  Genitourinary: Negative for dysuria.  Musculoskeletal: Negative for back pain and neck pain.  Skin: Negative for rash.  Neurological: Negative for syncope and light-headedness.  Psychiatric/Behavioral: The patient is not nervous/anxious.       Allergies  Review of patient's allergies indicates no known allergies.  Home Medications   Prior to Admission medications   Medication Sig Start Date End Date Taking? Authorizing Provider  Ascorbic Acid (VITAMIN C PO) Take 1 tablet by mouth daily.    Historical Provider, MD  aspirin EC 81 MG tablet Take 81 mg by mouth daily.    Historical Provider, MD  atorvastatin (LIPITOR) 20 MG tablet Take 1 tablet by mouth every morning.     Historical Provider, MD  famotidine (PEPCID) 20 MG tablet Take 1 tablet (20 mg total) by mouth 2 (two) times daily. 05/30/13   Fredia Sorrow, MD  iron polysaccharides (NIFEREX) 150 MG capsule Take 150 mg by mouth daily.    Historical Provider, MD  pantoprazole (PROTONIX) 40 MG tablet Take 40 mg by mouth daily.  12/21/12   Historical Provider, MD  SYNTHROID 150 MCG tablet Take 1 tablet by mouth every morning.  06/11/10   Historical Provider, MD  triamterene-hydrochlorothiazide (DYAZIDE) 37.5-25 MG per capsule Take 1 tablet by mouth daily.  06/11/10   Historical Provider, MD  Vitamin D, Ergocalciferol, (DRISDOL) 50000 UNITS CAPS capsule Take 50,000 Units by mouth every 7 (seven) days. mondays    Historical Provider, MD   BP 199/82 mmHg  Pulse 74  Temp(Src) 98.2 F (36.8 C) (Oral)  Resp 16  SpO2 94%   Physical Exam  Constitutional: He appears well-developed and well-nourished.  HENT:  Head: Normocephalic and atraumatic.  Mouth/Throat: Mucous membranes are normal. Mucous membranes are not dry.   Eyes: Conjunctivae are normal.  Neck: Trachea normal and normal range of motion. Neck supple. Normal carotid pulses and no JVD present. No muscular tenderness present. Carotid bruit is not present. No tracheal deviation present.  Cardiovascular: Normal rate, regular rhythm, S1 normal, S2 normal, normal heart sounds and intact distal pulses.  Exam reveals no distant heart sounds and no decreased pulses.   No murmur heard. Equal bilateral radial pulses.   Pulmonary/Chest: Effort normal and breath sounds normal. No respiratory distress. He has no wheezes. He exhibits no tenderness.  Abdominal: Soft. Normal aorta and bowel sounds are normal. There is no tenderness. There is no rebound and no guarding.  Musculoskeletal: He exhibits no edema.  Neurological: He is alert.  Skin: Skin is warm and dry. He is not diaphoretic. No cyanosis. No pallor.  Psychiatric: He has a normal mood and affect.  Nursing note and vitals reviewed.   ED Course  Procedures (including critical care time) Labs Review Labs Reviewed  CBC WITH DIFFERENTIAL/PLATELET - Abnormal; Notable for  the following:    Lymphocytes Relative 11 (*)    Eosinophils Relative 6 (*)    All other components within normal limits  BASIC METABOLIC PANEL - Abnormal; Notable for the following:    GFR calc non Af Amer 56 (*)    All other components within normal limits  D-DIMER, QUANTITATIVE (NOT AT [facility])  Randolm Idol, ED    Imaging Review Dg Chest 2 View  06/16/2014   CLINICAL DATA:  Substernal chest pain and shortness of breath beginning yesterday. Personal history of hypertension.  EXAM: CHEST - 2 VIEW  COMPARISON:  None.  FINDINGS: The heart size is normal. Emphysematous changes are again noted. No focal airspace disease is present. There is no edema or effusion to suggest failure. Atherosclerotic calcifications are present at the aortic arch. Thyroidectomy is noted.  IMPRESSION: 1. No acute cardiopulmonary disease. 2. Emphysema. 3.  Atherosclerosis.   Electronically Signed   By: San Morelle M.D.   On: 06/16/2014 10:08     EKG Interpretation None       9:05 AM Patient seen and examined. Work-up initiated. ASA/NTG ordered. EKG reviewed. New inferior t-wave inversions. Pre-existing RBBB.  Vital signs reviewed and are as follows: BP 199/82 mmHg  Pulse 74  Temp(Src) 98.2 F (36.8 C) (Oral)  Resp 16  SpO2 94%  ED ECG REPORT   Date: 06/16/2014  Rate: 79  Rhythm: normal sinus rhythm  QRS Axis: right  Intervals: normal  ST/T Wave abnormalities: nonspecific T wave changes  Conduction Disutrbances:right bundle branch block and left posterior fascicular block  Narrative Interpretation:   Old EKG Reviewed: changes noted, t-waves in III, aVf now inverted  I have personally reviewed the EKG tracing and agree with the computerized printout as noted.  9:25 AM Slight improvement after NTG but patient still feels soreness. O2 sat 89-90% on RA while at rest. Given hypoxia, d-dimer added.   10:31 AM Patient ambulated 90-93% on pulse ox. When back in bed, drops to mid-80's before recovering. Soreness now 2/10. D-dimer pending.   BP 165/71 mmHg  Pulse 66  Temp(Src) 98.2 F (36.8 C) (Oral)  Resp 13  SpO2 93%  11:10 AM D-dimer neg. Patient discussed with and seen by Dr. Jeanell Sparrow. Will admit for CP.   11:39 AM Spoke with Dr. Dyann Kief who will see.   MDM   Final diagnoses:  Chest pain, unspecified chest pain type  Shortness of breath   Admit for CP/SOB, does have minor EKG changes.    Carlisle Cater, PA-C 06/16/14 1142  Pattricia Boss, MD 06/21/14 [phone]

## 2014-06-17 ENCOUNTER — Other Ambulatory Visit (HOSPITAL_COMMUNITY): Payer: Medicare Other

## 2014-06-17 DIAGNOSIS — K219 Gastro-esophageal reflux disease without esophagitis: Secondary | ICD-10-CM

## 2014-06-17 DIAGNOSIS — I10 Essential (primary) hypertension: Secondary | ICD-10-CM

## 2014-06-17 DIAGNOSIS — R0789 Other chest pain: Secondary | ICD-10-CM

## 2014-06-17 DIAGNOSIS — R1013 Epigastric pain: Secondary | ICD-10-CM | POA: Diagnosis not present

## 2014-06-17 DIAGNOSIS — R079 Chest pain, unspecified: Secondary | ICD-10-CM

## 2014-06-17 DIAGNOSIS — E785 Hyperlipidemia, unspecified: Secondary | ICD-10-CM

## 2014-06-17 DIAGNOSIS — E780 Pure hypercholesterolemia: Secondary | ICD-10-CM | POA: Diagnosis not present

## 2014-06-17 DIAGNOSIS — E039 Hypothyroidism, unspecified: Secondary | ICD-10-CM | POA: Diagnosis not present

## 2014-06-17 LAB — TROPONIN I
Troponin I: <0.03 ng/mL (ref <0.031)
Troponin I: <0.03 ng/mL (ref <0.031)

## 2014-06-17 LAB — LIPID PANEL
CHOLESTEROL: 97 mg/dL (ref 0–200)
HDL: 34 mg/dL — ABNORMAL LOW (ref >40)
LDL Cholesterol: 51 mg/dL (ref 0–99)
Total CHOL/HDL Ratio: 2.9 RATIO
Triglycerides: 59 mg/dL (ref <150)
VLDL: 12 mg/dL (ref 0–40)

## 2014-06-17 MED ORDER — POLYETHYLENE GLYCOL 3350 17 G PO PACK
17.0000 g | PACK | Freq: Every day | ORAL | Status: DC
  Administered 2014-06-17 – 2014-06-18 (×2): 17 g via ORAL
  Filled 2014-06-17 (×2): qty 1

## 2014-06-17 MED ORDER — LOSARTAN POTASSIUM 50 MG PO TABS
100.0000 mg | ORAL_TABLET | Freq: Every day | ORAL | Status: DC
  Administered 2014-06-17 – 2014-06-18 (×2): 100 mg via ORAL
  Filled 2014-06-17 (×2): qty 2

## 2014-06-17 MED ORDER — AMLODIPINE BESYLATE 5 MG PO TABS
5.0000 mg | ORAL_TABLET | Freq: Every day | ORAL | Status: DC
  Administered 2014-06-17: 5 mg via ORAL
  Filled 2014-06-17: qty 1

## 2014-06-17 NOTE — Consult Note (Signed)
CONSULTATION NOTE  Reason for Consult: Chest pain  Requesting Physician: Dr. Alferd Patee  Cardiologist: Dr. Johney Frame  HPI: This is a 81 y.o. male with a past medical history significant for mild coronary artery disease with a negative stress test in 2012, right bundle branch block with left anterior fascicular block, GERD, hypertension, dyslipidemia and bladder as well as thyroid cancer, status post thyroidectomy. He presents with 12-16 hours of localized midepigastric discomfort. This did not radiate or necessarily worsen after eating or improve with any activities or medication. He did report some relief after aspirin and nitroglycerin however that was several hours later. This is similar to the episode he had in the past which was identified as GERD. Initial EKG showed nonspecific T-wave changes and his chest x-ray was negative. This morning he reports this discomfort has mostly resolved, although he has some mild tenderness in the midepigastrium when he pushes on it. Of note blood pressure on admission was elevated. He recently was started on a combination diaphoretic however that was discontinued by his primary care provider.  PMHx:  Past Medical History  Diagnosis Date  . Hypertension   . Hx of thyroid cancer     s/p total thyroidectomy,  . Hypothyroidism   . Hyperlipidemia   . 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 Coronary artery disease CARDIOLOGIST- DR Hillis Range    LAST VISIT NOTE 08-13-10 AND STRESS TEST 08-17-10 IN EPIC (LOW RISK ,  NO ISCHEMIA)  . History of basal cell carcinoma excision BILATERAL ARMS  . Arthritis     BACK  . GERD (gastroesophageal reflux disease)     CONTROLLED W/ PROTONIX  . Bladder tumor     CYSTO W/ BX SCHEDULED 12-15-10  . Nocturia   . Cancer     hx of bladder and thyroid cancer   . Complication of anesthesia 12/2010    told he had a "stiff neck" during intubation   . Tubular adenoma of colon 04/2002    Past Surgical History  Procedure Laterality Date  . Total thyroidectomy  1991  . Cataract extraction w/ intraocular lens  implant, bilateral    . Cystoscopy with biopsy  12/16/2010    Procedure: CYSTOSCOPY WITH BIOPSY;  Surgeon: Milford Cage, MD;  Location: [facility];  Service: Urology;  Laterality: N/A;  . Transurethral resection of bladder tumor  12/16/2010    Procedure: TRANSURETHRAL RESECTION OF BLADDER TUMOR (TURBT);  Surgeon: Milford Cage, MD;  Location: [facility];  Service: Urology;  Laterality: N/A;  . Cystoscopy/retrograde/ureteroscopy  12/16/2010    Procedure: CYSTOSCOPY/RETROGRADE/URETEROSCOPY;  Surgeon: Milford Cage, MD;  Location: [facility];  Service: Urology;  Laterality: Right;  bilateral retrograde, right ureteroscopy  . Eye surgery      attached lens to iris    FAMHx: Family History  Problem Relation Age of Onset  . Lung cancer Brother   . Thyroid cancer    . Diabetes Father     SOCHx:  reports that he quit smoking about 32 years ago. His smoking use included Cigarettes. He has a 70 pack-year smoking history. He has quit using smokeless tobacco. He reports that he drinks alcohol. He reports that he does not use illicit drugs.  ALLERGIES: No Known Allergies  ROS: A comprehensive review of systems was negative except for: Cardiovascular: positive for chest pain  HOME MEDICATIONS: Prescriptions prior to admission  Medication Sig Dispense Refill Last Dose  .  aspirin EC 81 MG tablet Take 81 mg by mouth daily.   06/15/2014 at Unknown time  . atorvastatin (LIPITOR) 20 MG tablet Take 1 tablet by mouth every morning.    06/15/2014 at Unknown time  . losartan (COZAAR) 50 MG tablet Take 50 mg by mouth daily.   06/15/2014 at Unknown time  . pantoprazole (PROTONIX) 40 MG tablet Take 40 mg by mouth daily.    06/15/2014 at Unknown time  . SYNTHROID 150 MCG tablet Take 1 tablet by mouth every morning.     06/15/2014 at Unknown time  . testosterone enanthate (DELATESTRYL) 200 MG/ML injection Inject 200 mg into the muscle every 28 (twenty-eight) days. For IM use only   Past Month at Unknown time  . Ascorbic Acid (VITAMIN C PO) Take 1 tablet by mouth daily.   05/29/2013 at Unknown time  . famotidine (PEPCID) 20 MG tablet Take 1 tablet (20 mg total) by mouth 2 (two) times daily. 30 tablet 1   . iron polysaccharides (NIFEREX) 150 MG capsule Take 150 mg by mouth daily.   05/29/2013 at Unknown time  . triamterene-hydrochlorothiazide (DYAZIDE) 37.5-25 MG per capsule Take 1 tablet by mouth daily.    05/29/2013 at Unknown time  . Vitamin D, Ergocalciferol, (DRISDOL) 50000 UNITS CAPS capsule Take 50,000 Units by mouth every 7 (seven) days. mondays   Past Week at Unknown time    HOSPITAL MEDICATIONS: I have reviewed the patient's current medications.  VITALS: Blood pressure 172/66, pulse 63, temperature 97.8 F (36.6 C), temperature source Oral, resp. rate 15, height 6\' 4"  (1.93 m), weight 197 lb 1.6 oz (89.404 kg), SpO2 98 %.  PHYSICAL EXAM: General appearance: alert and no distress Neck: no carotid bruit and no JVD Lungs: rales bibasilar Heart: regular rate and rhythm, S1, S2 normal, no murmur, click, rub or gallop Abdomen: soft, non-tender; bowel sounds normal; no masses,  no organomegaly Extremities: extremities normal, atraumatic, no cyanosis or edema Pulses: 2+ and symmetric Skin: Pale, warm, dry, multiple nevi on the legs Neurologic: Grossly normal Psych: Pleasant  LABS: Results for orders placed or performed during the hospital encounter of 06/16/14 (from the past 48 hour(s))  CBC with Differential/Platelet     Status: Abnormal   Collection Time: 06/16/14  9:30 AM  Result Value Ref Range   WBC 6.4 4.0 - 10.5 K/uL   RBC 4.85 4.22 - 5.81 MIL/uL   Hemoglobin 15.3 13.0 - 17.0 g/dL   HCT 08/16/14 44.3 - 60.1 %   MCV 94.8 78.0 - 100.0 fL   MCH 31.5 26.0 - 34.0 pg   MCHC 33.3 30.0 - 36.0 g/dL    RDW 65.8 00.6 - 34.9 %   Platelets 257 150 - 400 K/uL   Neutrophils Relative % 74 43 - 77 %   Neutro Abs 4.7 1.7 - 7.7 K/uL   Lymphocytes Relative 11 (L) 12 - 46 %   Lymphs Abs 0.7 0.7 - 4.0 K/uL   Monocytes Relative 9 3 - 12 %   Monocytes Absolute 0.6 0.1 - 1.0 K/uL   Eosinophils Relative 6 (H) 0 - 5 %   Eosinophils Absolute 0.4 0.0 - 0.7 K/uL   Basophils Relative 0 0 - 1 %   Basophils Absolute 0.0 0.0 - 0.1 K/uL  Basic metabolic panel     Status: Abnormal   Collection Time: 06/16/14  9:30 AM  Result Value Ref Range   Sodium 141 135 - 145 mmol/L   Potassium 4.2 3.5 - 5.1 mmol/L  Chloride 103 101 - 111 mmol/L   CO2 27 22 - 32 mmol/L   Glucose, Bld 89 65 - 99 mg/dL   BUN 16 6 - 20 mg/dL   Creatinine, Ser 1.15 0.61 - 1.24 mg/dL   Calcium 9.2 8.9 - 10.3 mg/dL   GFR calc non Af Amer 56 (L) >60 mL/min   GFR calc Af Amer >60 >60 mL/min    Comment: (NOTE) The eGFR has been calculated using the CKD EPI equation. This calculation has not been validated in all clinical situations. eGFR's persistently <60 mL/min signify possible Chronic Kidney Disease.    Anion gap 11 5 - 15  D-dimer, quantitative (not at [facility])     Status: None   Collection Time: 06/16/14  9:30 AM  Result Value Ref Range   D-Dimer, Quant <0.27 0.00 - 0.48 ug/mL-FEU    Comment:        AT THE INHOUSE ESTABLISHED CUTOFF VALUE OF 0.48 ug/mL FEU, THIS ASSAY HAS BEEN DOCUMENTED IN THE LITERATURE TO HAVE A SENSITIVITY AND NEGATIVE PREDICTIVE VALUE OF AT LEAST 98 TO 99%.  THE TEST RESULT SHOULD BE CORRELATED WITH AN ASSESSMENT OF THE CLINICAL PROBABILITY OF DVT / VTE.   I-stat troponin, ED     Status: None   Collection Time: 06/16/14  9:41 AM  Result Value Ref Range   Troponin i, poc 0.00 0.00 - 0.08 ng/mL   Comment 3            Comment: Due to the release kinetics of cTnI, a negative result within the first hours of the onset of symptoms does not rule out myocardial infarction with certainty. If myocardial  infarction is still suspected, repeat the test at appropriate intervals.   TSH     Status: None   Collection Time: 06/16/14  6:29 PM  Result Value Ref Range   TSH 3.757 0.350 - 4.500 uIU/mL  Troponin I     Status: None   Collection Time: 06/16/14  6:29 PM  Result Value Ref Range   Troponin I <0.03 <0.031 ng/mL    Comment:        NO INDICATION OF MYOCARDIAL INJURY.   Troponin I     Status: None   Collection Time: 06/16/14 11:30 PM  Result Value Ref Range   Troponin I <0.03 <0.031 ng/mL    Comment:        NO INDICATION OF MYOCARDIAL INJURY.   Troponin I     Status: None   Collection Time: 06/17/14  5:10 AM  Result Value Ref Range   Troponin I <0.03 <0.031 ng/mL    Comment:        NO INDICATION OF MYOCARDIAL INJURY.   Lipid panel     Status: Abnormal   Collection Time: 06/17/14  5:10 AM  Result Value Ref Range   Cholesterol 97 0 - 200 mg/dL   Triglycerides 59 <150 mg/dL   HDL 34 (L) >40 mg/dL   Total CHOL/HDL Ratio 2.9 RATIO   VLDL 12 0 - 40 mg/dL   LDL Cholesterol 51 0 - 99 mg/dL    Comment:        Total Cholesterol/HDL:CHD Risk Coronary Heart Disease Risk Table                     Men   Women  1/2 Average Risk   3.4   3.3  Average Risk       5.0   4.4  2 X Average  Risk   9.6   7.1  3 X Average Risk  23.4   11.0        Use the calculated Patient Ratio above and the CHD Risk Table to determine the patient's CHD Risk.        ATP III CLASSIFICATION (LDL):  <100     mg/dL   Optimal  100-129  mg/dL   Near or Above                    Optimal  130-159  mg/dL   Borderline  160-189  mg/dL   High  >190     mg/dL   Very High     IMAGING: Dg Chest 2 View  06/16/2014   CLINICAL DATA:  Substernal chest pain and shortness of breath beginning yesterday. Personal history of hypertension.  EXAM: CHEST - 2 VIEW  COMPARISON:  None.  FINDINGS: The heart size is normal. Emphysematous changes are again noted. No focal airspace disease is present. There is no edema or effusion  to suggest failure. Atherosclerotic calcifications are present at the aortic arch. Thyroidectomy is noted.  IMPRESSION: 1. No acute cardiopulmonary disease. 2. Emphysema. 3. Atherosclerosis.   Electronically Signed   By: San Morelle M.D.   On: 06/16/2014 10:08    HOSPITAL DIAGNOSES: Principal Problem:   Chest pain Active Problems:   HTN (hypertension)   Hyperlipidemia   Chronic restrictive lung disease   GERD (gastroesophageal reflux disease)   Hypothyroidism   Pain in the chest   Shortness of breath   IMPRESSION: 1. Atypical chest pain, suspect GERD 2. Hypertension-uncontrolled  RECOMMENDATION: 1. Mr. Jose Lawson was describing more than 12 hours of a constant, mid epigastric discomfort which did temporally get better after aspirin and nitroglycerin however I suspect that ultimately resolved spontaneously. EKG appears unchanged overnight. Troponins were negative 3. Laboratory work is generally unremarkable with a low LDL cholesterol 51 and total cholesterol 97. I suspect this is more likely reflux. He does take Protonix and may need a stronger PPI. Blood pressure is also elevated today. It appears that his Dyazide was restarted and this should help additionally with blood pressure. He is also on losartan 50 mg daily and I think we could easily increase that to 100 mg daily. This should help with his blood pressure additionally. Although I suspect his chest pain is noncardiac, given his age and risk factors, it would be reasonable to consider outpatient stress testing. It's been over 3 years since his last stress test. I will try to arrange this as well as a follow-up with Dr. Rayann Heman in the office.  Thank you for the consultation. From a cardiac standpoint, I feel that he could be discharged today once we have seen the effect of the increase of his blood pressure medications.  Time Spent Directly with Patient: 45 minutes  Pixie Casino, MD, [facility] Attending Cardiologist [place] 06/17/2014, 8:41 AM

## 2014-06-17 NOTE — Progress Notes (Signed)
PROGRESS NOTE    Jose Lawson Sr. VHQ:[id number] DOB: 05/26/1925 DOA: 06/16/2014 PCP: Donnajean Lopes, MD  HPI/Brief narrative 81 year old male with history of HTN, hypothyroid, HLD, RBBB, restrictive lung disease (follows with Dr. Melvyn Novas) and GERD admitted to [facility] on 06/16/14 for chest pain. Chest pain described as mid chest/epigastric area, some worsening with exertion, nonradiating, associated with dyspnea and improved after NTG given in ED. Patient has had similar symptoms in the past attributed to GERD. In ED, troponin negative, nonspecific T-wave inversions in inferior leads which were new and chest x-ray without acute findings. Cardiology consulted and recommended outpatient stress test.   Assessment/Plan:  Atypical chest pain - Ruled out for MI by troponins 3 negative - Cardiology consultation appreciated. Discussed with Dr. Debara Pickett. EKG appears unchanged overnight. Suspect chest pain mostly from GERD. - Recommend outpatient stress testing. - Continue aspirin and statins. No beta blocker secondary to bradycardia.  Uncontrolled hypertension - Continue Dyazide home dose - Losartan increased from 50 MG to 100 MG daily but blood pressures still elevated in the 160s. - We'll start amlodipine 5 MG daily and monitor.  GERD - PPI increased to twice a day and Carafate added  Hyperlipidemia - Continue statins - Fasting lipids: LDL 51. HDL 34  Hypothyroid - Continue Synthroid  Chronic restrictive lung disease - Stable. Follows with pulmonology as outpatient.  Code Status: Full Family Communication: Discussed with spouse at bedside. Disposition Plan: DC home possibly 06/18/14   Consultants:  Cardiology  Procedures:  None  Antibiotics:  None   Subjective: No further chest pains since last night. Denies any other complaints.  Objective: Filed Vitals:   06/17/14 1500 06/17/14 1524 06/17/14 1525 06/17/14 1555  BP: 163/77 182/81 176/106 164/72  Pulse:      Temp:        TempSrc:      Resp:      Height:      Weight:      SpO2:       temperature 97.75F, pulse 77/m regular, respiratory rate 15 per minute and oxygen saturation 95%.  Intake/Output Summary (Last 24 hours) at 06/17/14 1624 Last data filed at 06/17/14 1300  Gross per 24 hour  Intake    500 ml  Output      0 ml  Net    500 ml   Filed Weights   06/16/14 1238  Weight: 89.404 kg (197 lb 1.6 oz)     Exam:  General exam: Pleasant elderly male lying comfortably propped up in bed Respiratory system: Clear. No increased work of breathing. Cardiovascular system: S1 & S2 heard, RRR. No JVD, murmurs, gallops, clicks or pedal edema. Telemetry: Sinus rhythm-SB >55 Gastrointestinal system: Abdomen is nondistended, soft and nontender. Normal bowel sounds heard. Central nervous system: Alert and oriented. No focal neurological deficits. Extremities: Symmetric 5 x 5 power.   Data Reviewed: Basic Metabolic Panel:  Recent Labs Lab 06/16/14 0930  NA 141  K 4.2  CL 103  CO2 27  GLUCOSE 89  BUN 16  CREATININE 1.15  CALCIUM 9.2   Liver Function Tests: No results for input(s): AST, ALT, ALKPHOS, BILITOT, PROT, ALBUMIN in the last 168 hours. No results for input(s): LIPASE, AMYLASE in the last 168 hours. No results for input(s): AMMONIA in the last 168 hours. CBC:  Recent Labs Lab 06/16/14 0930  WBC 6.4  NEUTROABS 4.7  HGB 15.3  HCT 46.0  MCV 94.8  PLT 257   Cardiac Enzymes:  Recent Labs Lab 06/16/14  1829 06/16/14 2330 06/17/14 0510  TROPONINI <0.03 <0.03 <0.03   BNP (last 3 results) No results for input(s): PROBNP in the last 8760 hours. CBG: No results for input(s): GLUCAP in the last 168 hours.  No results found for this or any previous visit (from the past 240 hour(s)).       Studies: Dg Chest 2 View  06/16/2014   CLINICAL DATA:  Substernal chest pain and shortness of breath beginning yesterday. Personal history of hypertension.  EXAM: CHEST - 2 VIEW   COMPARISON:  None.  FINDINGS: The heart size is normal. Emphysematous changes are again noted. No focal airspace disease is present. There is no edema or effusion to suggest failure. Atherosclerotic calcifications are present at the aortic arch. Thyroidectomy is noted.  IMPRESSION: 1. No acute cardiopulmonary disease. 2. Emphysema. 3. Atherosclerosis.   Electronically Signed   By: San Morelle M.D.   On: 06/16/2014 10:08        Scheduled Meds: . amLODipine  5 mg Oral Daily  . aspirin EC  81 mg Oral Daily  . atorvastatin  20 mg Oral q1800  . famotidine  20 mg Oral QHS  . heparin  5,000 Units Subcutaneous 3 times per day  . levothyroxine  150 mcg Oral QAC breakfast  . losartan  100 mg Oral Daily  . pantoprazole  40 mg Oral BID  . polyethylene glycol  17 g Oral Daily  . sucralfate  1 g Oral TID  . triamterene-hydrochlorothiazide  1 capsule Oral Daily   Continuous Infusions: . sodium chloride      Principal Problem:   Chest pain Active Problems:   HTN (hypertension)   Hyperlipidemia   Chronic restrictive lung disease   GERD (gastroesophageal reflux disease)   Hypothyroidism   Pain in the chest   Shortness of breath    Time spent: 25 minutes    Graylon Amory, MD, FACP, FHM. [facility] Pager [phone]  If 7PM-7AM, please contact night-coverage [email or web address] Password [facility] 06/17/2014, 4:24 PM

## 2014-06-18 ENCOUNTER — Other Ambulatory Visit (HOSPITAL_COMMUNITY): Payer: Medicare Other

## 2014-06-18 DIAGNOSIS — R079 Chest pain, unspecified: Secondary | ICD-10-CM | POA: Diagnosis not present

## 2014-06-18 DIAGNOSIS — E039 Hypothyroidism, unspecified: Secondary | ICD-10-CM

## 2014-06-18 DIAGNOSIS — I10 Essential (primary) hypertension: Secondary | ICD-10-CM | POA: Diagnosis not present

## 2014-06-18 DIAGNOSIS — K219 Gastro-esophageal reflux disease without esophagitis: Secondary | ICD-10-CM | POA: Diagnosis not present

## 2014-06-18 LAB — BASIC METABOLIC PANEL
Anion gap: 7 (ref 5–15)
BUN: 16 mg/dL (ref 6–20)
CO2: 30 mmol/L (ref 22–32)
CREATININE: 1.08 mg/dL (ref 0.61–1.24)
Calcium: 8.8 mg/dL — ABNORMAL LOW (ref 8.9–10.3)
Chloride: 100 mmol/L — ABNORMAL LOW (ref 101–111)
Glucose, Bld: 101 mg/dL — ABNORMAL HIGH (ref 65–99)
Potassium: 3.6 mmol/L (ref 3.5–5.1)
SODIUM: 137 mmol/L (ref 135–145)

## 2014-06-18 MED ORDER — PANTOPRAZOLE SODIUM 40 MG PO TBEC: 40.0000 mg | DELAYED_RELEASE_TABLET | Freq: Two times a day (BID) | ORAL | Status: DC

## 2014-06-18 MED ORDER — TRIAMTERENE-HCTZ 37.5-25 MG PO CAPS: 1.0000 | ORAL_CAPSULE | Freq: Every day | ORAL | Status: DC

## 2014-06-18 MED ORDER — AMLODIPINE BESYLATE 10 MG PO TABS: 10.0000 mg | ORAL_TABLET | Freq: Every day | ORAL | Status: DC

## 2014-06-18 MED ORDER — LOSARTAN POTASSIUM 50 MG PO TABS
100.0000 mg | ORAL_TABLET | Freq: Every day | ORAL | Status: DC
Start: 2014-06-18 — End: 2014-07-03

## 2014-06-18 MED ORDER — AMLODIPINE BESYLATE 10 MG PO TABS
10.0000 mg | ORAL_TABLET | Freq: Every day | ORAL | Status: DC
  Administered 2014-06-18: 10 mg via ORAL
  Filled 2014-06-18: qty 1

## 2014-06-18 NOTE — CV Procedure (Signed)
Patient Discharged Before Echo could be completed.

## 2014-06-18 NOTE — Discharge Instructions (Signed)
Chest Pain (Nonspecific) °It is often hard to give a specific diagnosis for the cause of chest pain. There is always a chance that your pain could be related to something serious, such as a heart attack or a blood clot in the lungs. You need to follow up with your health care provider for further evaluation. °CAUSES  °· Heartburn. °· Pneumonia or bronchitis. °· Anxiety or stress. °· Inflammation around your heart (pericarditis) or lung (pleuritis or pleurisy). °· A blood clot in the lung. °· A collapsed lung (pneumothorax). It can develop suddenly on its own (spontaneous pneumothorax) or from trauma to the chest. °· Shingles infection (herpes zoster virus). °The chest wall is composed of bones, muscles, and cartilage. Any of these can be the source of the pain. °· The bones can be bruised by injury. °· The muscles or cartilage can be strained by coughing or overwork. °· The cartilage can be affected by inflammation and become sore (costochondritis). °DIAGNOSIS  °Lab tests or other studies may be needed to find the cause of your pain. Your health care provider may have you take a test called an ambulatory electrocardiogram (ECG). An ECG records your heartbeat patterns over a 24-hour period. You may also have other tests, such as: °· Transthoracic echocardiogram (TTE). During echocardiography, sound waves are used to evaluate how blood flows through your heart. °· Transesophageal echocardiogram (TEE). °· Cardiac monitoring. This allows your health care provider to monitor your heart rate and rhythm in real time. °· Holter monitor. This is a portable device that records your heartbeat and can help diagnose heart arrhythmias. It allows your health care provider to track your heart activity for several days, if needed. °· Stress tests by exercise or by giving medicine that makes the heart beat faster. °TREATMENT  °· Treatment depends on what may be causing your chest pain. Treatment may include: °· Acid blockers for  heartburn. °· Anti-inflammatory medicine. °· Pain medicine for inflammatory conditions. °· Antibiotics if an infection is present. °· You may be advised to change lifestyle habits. This includes stopping smoking and avoiding alcohol, caffeine, and chocolate. °· You may be advised to keep your head raised (elevated) when sleeping. This reduces the chance of acid going backward from your stomach into your esophagus. °Most of the time, nonspecific chest pain will improve within 2-3 days with rest and mild pain medicine.  °HOME CARE INSTRUCTIONS  °· If antibiotics were prescribed, take them as directed. Finish them even if you start to feel better. °· For the next few days, avoid physical activities that bring on chest pain. Continue physical activities as directed. °· Do not use any tobacco products, including cigarettes, chewing tobacco, or electronic cigarettes. °· Avoid drinking alcohol. °· Only take medicine as directed by your health care provider. °· Follow your health care provider's suggestions for further testing if your chest pain does not go away. °· Keep any follow-up appointments you made. If you do not go to an appointment, you could develop lasting (chronic) problems with pain. If there is any problem keeping an appointment, call to reschedule. °SEEK MEDICAL CARE IF:  °· Your chest pain does not go away, even after treatment. °· You have a rash with blisters on your chest. °· You have a fever. °SEEK IMMEDIATE MEDICAL CARE IF:  °· You have increased chest pain or pain that spreads to your arm, neck, jaw, back, or abdomen. °· You have shortness of breath. °· You have an increasing cough, or you cough   up blood.  You have severe back or abdominal pain.  You feel nauseous or vomit.  You have severe weakness.  You faint.  You have chills. This is an emergency. Do not wait to see if the pain will go away. Get medical help at once. Call your local emergency services (911 in [place] Do not drive  yourself to the hospital. MAKE SURE YOU:   Understand these instructions.  Will watch your condition.  Will get help right away if you are not doing well or get worse. Document Released: 10/07/2004 Document Revised: 01/02/2013 Document Reviewed: 08/03/2007 [employer] Patient Information 2015 [place], [place]. This information is not intended to replace advice given to you by your health care provider. Make sure you discuss any questions you have with your health care provider.  Hypertension Hypertension, commonly called high blood pressure, is when the force of blood pumping through your arteries is too strong. Your arteries are the blood vessels that carry blood from your heart throughout your body. A blood pressure reading consists of a higher number over a lower number, such as 110/72. The higher number (systolic) is the pressure inside your arteries when your heart pumps. The lower number (diastolic) is the pressure inside your arteries when your heart relaxes. Ideally you want your blood pressure below 120/80. Hypertension forces your heart to work harder to pump blood. Your arteries may become narrow or stiff. Having hypertension puts you at risk for heart disease, stroke, and other problems.  RISK FACTORS Some risk factors for high blood pressure are controllable. Others are not.  Risk factors you cannot control include:   Race. You may be at higher risk if you are African American.  Age. Risk increases with age.  Gender. Men are at higher risk than women before age 17 years. After age 24, women are at higher risk than men. Risk factors you can control include:  Not getting enough exercise or physical activity.  Being overweight.  Getting too much fat, sugar, calories, or salt in your diet.  Drinking too much alcohol. SIGNS AND SYMPTOMS Hypertension does not usually cause signs or symptoms. Extremely high blood pressure (hypertensive crisis) may cause headache, anxiety, shortness  of breath, and nosebleed. DIAGNOSIS  To check if you have hypertension, your health care provider will measure your blood pressure while you are seated, with your arm held at the level of your heart. It should be measured at least twice using the same arm. Certain conditions can cause a difference in blood pressure between your right and left arms. A blood pressure reading that is higher than normal on one occasion does not mean that you need treatment. If one blood pressure reading is high, ask your health care provider about having it checked again. TREATMENT  Treating high blood pressure includes making lifestyle changes and possibly taking medicine. Living a healthy lifestyle can help lower high blood pressure. You may need to change some of your habits. Lifestyle changes may include:  Following the DASH diet. This diet is high in fruits, vegetables, and whole grains. It is low in salt, red meat, and added sugars.  Getting at least 2 hours of brisk physical activity every week.  Losing weight if necessary.  Not smoking.  Limiting alcoholic beverages.  Learning ways to reduce stress. If lifestyle changes are not enough to get your blood pressure under control, your health care provider may prescribe medicine. You may need to take more than one. Work closely with your health care provider  to understand the risks and benefits. HOME CARE INSTRUCTIONS  Have your blood pressure rechecked as directed by your health care provider.   Take medicines only as directed by your health care provider. Follow the directions carefully. Blood pressure medicines must be taken as prescribed. The medicine does not work as well when you skip doses. Skipping doses also puts you at risk for problems.   Do not smoke.   Monitor your blood pressure at home as directed by your health care provider. SEEK MEDICAL CARE IF:   You think you are having a reaction to medicines taken.  You have recurrent  headaches or feel dizzy.  You have swelling in your ankles.  You have trouble with your vision. SEEK IMMEDIATE MEDICAL CARE IF:  You develop a severe headache or confusion.  You have unusual weakness, numbness, or feel faint.  You have severe chest or abdominal pain.  You vomit repeatedly.  You have trouble breathing. MAKE SURE YOU:   Understand these instructions.  Will watch your condition.  Will get help right away if you are not doing well or get worse. Document Released: 12/28/2004 Document Revised: 05/14/2013 Document Reviewed: 10/20/2012 [facility] Patient Information 2015 [place], [place]. This information is not intended to replace advice given to you by your health care provider. Make sure you discuss any questions you have with your health care provider.

## 2014-06-18 NOTE — Discharge Summary (Signed)
Physician Discharge Summary  Jose MCLUCKIE Sr. ZGY:[id number] DOB: 05/15/1925 DOA: 06/16/2014  PCP: Donnajean Lopes, MD  Admit date: 06/16/2014 Discharge date: 06/18/2014  Time spent: Less than 30 minutes  Recommendations for Outpatient Follow-up:  1. Dr. Bevelyn Buckles, PCP in 3 days with repeat labs (BMP). Will need evaluation and adjustment of antihypertensives, as needed. 2. Dr. Thompson Grayer, Cardiology: MDs office will call patient with appointment. Follow-up regarding outpatient stress test.  Discharge Diagnoses:  Principal Problem:   Chest pain Active Problems:   HTN (hypertension)   Hyperlipidemia   Chronic restrictive lung disease   GERD (gastroesophageal reflux disease)   Hypothyroidism   Pain in the chest   Shortness of breath   Discharge Condition: Improved & Stable  Diet recommendation: Heart healthy diet.  Filed Weights   06/16/14 1238 06/18/14 0400  Weight: 89.404 kg (197 lb 1.6 oz) 86.546 kg (190 lb 12.8 oz)    History of present illness:  81 year old male with history of HTN, hypothyroid, HLD, RBBB, restrictive lung disease (follows with Dr. Melvyn Novas) and GERD admitted to [facility] on 06/16/14 for chest pain. Chest pain described as mid chest/epigastric area, some worsening with exertion, nonradiating, associated with dyspnea and improved after NTG given in ED. Patient has had similar symptoms in the past attributed to GERD. In ED, troponin negative, nonspecific T-wave inversions in inferior leads which were new and chest x-ray without acute findings. Cardiology consulted and recommended outpatient stress test.   Hospital Course:   Atypical chest pain - Ruled out for MI by troponins 3 negative - Cardiology consultation appreciated. Discussed with Dr. Debara Pickett. EKG appears unchanged overnight. Suspect chest pain mostly from GERD. - Recommend outpatient stress testing and cardiologist's office will arrange follow-up. - Continue aspirin and statins. No beta blocker  secondary to bradycardia. - Resolved and has not recurred.  Uncontrolled hypertension - As per patient and spouse, patient initially used to be on Dyazide 1 capsule daily. 3 months ago, Cozaar was added due to uncontrolled blood pressure with SBP in the 160s.. However when patient ran out of both these medications and called PCPs office, only Cozaar was refilled. Patient not sure if Dyazide was discontinued or he just did not get a refill prescription. - He was started on Dyazide 1 capsule daily and Cozaar 50 MG daily. Due to uncontrolled blood pressure, Cozaar was increased to 100 MG daily and amlodipine was added at 10 MG daily. Blood pressures are mildly uncontrolled with SBP in the 160s. - Patient advised to follow-up with PCP closely for outpatient management and he and spouse verbalized understanding.  GERD - Patient's atypical chest pain on presentation was felt to be related to GERD. Patient is on H2 blockers BID and PPI was increased from daily to twice a day. Consider outpatient GI consultation for possible EGD if not already performed.  Hyperlipidemia - Continue statins - Fasting lipids: LDL 51. HDL 34  Hypothyroid - Continue Synthroid  Chronic restrictive lung disease - Stable. Follows with pulmonology as outpatient.    Consultants:  Cardiology  Procedures:  None   Discharge Exam:  Complaints: Denies complaints. No chest pain or dyspnea. Had a small BM yesterday. No abdominal pain, nausea or vomiting.   Filed Vitals:   06/17/14 2030 06/18/14 0400 06/18/14 0727 06/18/14 1112  BP: 162/70 164/81    Pulse:  68 69   Temp:  98.5 F (36.9 C) 98.6 F (37 C) 98.4 F (36.9 C)  TempSrc:   Oral Oral  Resp:  14    Height:      Weight:  86.546 kg (190 lb 12.8 oz)    SpO2:  94% 93% 97%    General exam: Pleasant elderly male lying comfortably propped up in bed Respiratory system: Clear. No increased work of breathing. Cardiovascular system: S1 & S2 heard, RRR. No  JVD, murmurs, gallops, clicks or pedal edema. Telemetry: Sinus rhythm-SR Gastrointestinal system: Abdomen is nondistended, soft and nontender. Normal bowel sounds heard. Central nervous system: Alert and oriented. No focal neurological deficits. Extremities: Symmetric 5 x 5 power.  Discharge Instructions      Discharge Instructions    Call MD for:  difficulty breathing, headache or visual disturbances    Complete by:  As directed      Call MD for:  extreme fatigue    Complete by:  As directed      Call MD for:  persistant dizziness or light-headedness    Complete by:  As directed      Call MD for:  severe uncontrolled pain    Complete by:  As directed      Diet - low sodium heart healthy    Complete by:  As directed      Increase activity slowly    Complete by:  As directed             Medication List    TAKE these medications        amLODipine 10 MG tablet  Commonly known as:  NORVASC  Take 1 tablet (10 mg total) by mouth daily.     aspirin EC 81 MG tablet  Take 81 mg by mouth daily.     atorvastatin 20 MG tablet  Commonly known as:  LIPITOR  Take 1 tablet by mouth every morning.     famotidine 20 MG tablet  Commonly known as:  PEPCID  Take 1 tablet (20 mg total) by mouth 2 (two) times daily.     iron polysaccharides 150 MG capsule  Commonly known as:  NIFEREX  Take 150 mg by mouth daily.     losartan 50 MG tablet  Commonly known as:  COZAAR  Take 2 tablets (100 mg total) by mouth daily.     pantoprazole 40 MG tablet  Commonly known as:  PROTONIX  Take 1 tablet (40 mg total) by mouth 2 (two) times daily before a meal.     SYNTHROID 150 MCG tablet  Generic drug:  levothyroxine  Take 1 tablet by mouth every morning.     testosterone enanthate 200 MG/ML injection  Commonly known as:  DELATESTRYL  Inject 200 mg into the muscle every 28 (twenty-eight) days. For IM use only     triamterene-hydrochlorothiazide 37.5-25 MG per capsule  Commonly known as:   DYAZIDE  Take 1 each (1 capsule total) by mouth daily.     VITAMIN C PO  Take 1 tablet by mouth daily.     Vitamin D (Ergocalciferol) 50000 UNITS Caps capsule  Commonly known as:  DRISDOL  Take 50,000 Units by mouth every 7 (seven) days. mondays       Follow-up Information    Follow up with Thompson Grayer, MD.   Specialty:  Cardiology   Why:  office will call you with an appointment   Contact information:   [address] [place] [address] [phone]       Follow up with Donnajean Lopes, MD. Schedule an appointment as soon as possible for a visit in  3 days.   Specialty:  Internal Medicine   Why:  To be seen with repeat labs (BMP).   Contact information:   [address] [place] [address] [phone]        The results of significant diagnostics from this hospitalization (including imaging, microbiology, ancillary and laboratory) are listed below for reference.    Significant Diagnostic Studies: Dg Chest 2 View  06/16/2014   CLINICAL DATA:  Substernal chest pain and shortness of breath beginning yesterday. Personal history of hypertension.  EXAM: CHEST - 2 VIEW  COMPARISON:  None.  FINDINGS: The heart size is normal. Emphysematous changes are again noted. No focal airspace disease is present. There is no edema or effusion to suggest failure. Atherosclerotic calcifications are present at the aortic arch. Thyroidectomy is noted.  IMPRESSION: 1. No acute cardiopulmonary disease. 2. Emphysema. 3. Atherosclerosis.   Electronically Signed   By: San Morelle M.D.   On: 06/16/2014 10:08    Microbiology: No results found for this or any previous visit (from the past 240 hour(s)).   Labs: Basic Metabolic Panel:  Recent Labs Lab 06/16/14 0930 06/18/14 0306  NA 141 137  K 4.2 3.6  CL 103 100*  CO2 27 30  GLUCOSE 89 101*  BUN 16 16  CREATININE 1.15 1.08  CALCIUM 9.2 8.8*   Liver Function Tests: No results for input(s): AST, ALT, ALKPHOS,  BILITOT, PROT, ALBUMIN in the last 168 hours. No results for input(s): LIPASE, AMYLASE in the last 168 hours. No results for input(s): AMMONIA in the last 168 hours. CBC:  Recent Labs Lab 06/16/14 0930  WBC 6.4  NEUTROABS 4.7  HGB 15.3  HCT 46.0  MCV 94.8  PLT 257   Cardiac Enzymes:  Recent Labs Lab 06/16/14 1829 06/16/14 2330 06/17/14 0510  TROPONINI <0.03 <0.03 <0.03   BNP: BNP (last 3 results) No results for input(s): BNP in the last 8760 hours.  ProBNP (last 3 results) No results for input(s): PROBNP in the last 8760 hours.  CBG: No results for input(s): GLUCAP in the last 168 hours.    Signed:  Vernell Leep, MD, FACP, FHM. [facility] Pager [phone]  If 7PM-7AM, please contact night-coverage [email or web address] Password [email or web address] 06/18/2014, 1:14 PM

## 2014-06-18 NOTE — Progress Notes (Signed)
Given and explained D/C instructions along with new prescriptions. Spouse at the bedside and pt verbalized understanding. Patient discharged home, self care.

## 2014-07-03 ENCOUNTER — Encounter: Payer: Self-pay | Admitting: Internal Medicine

## 2014-07-03 ENCOUNTER — Ambulatory Visit (INDEPENDENT_AMBULATORY_CARE_PROVIDER_SITE_OTHER): Payer: Medicare Other | Admitting: Internal Medicine

## 2014-07-03 VITALS — BP 110/60 | HR 86 | Ht 76.0 in | Wt 196.8 lb

## 2014-07-03 DIAGNOSIS — R079 Chest pain, unspecified: Secondary | ICD-10-CM | POA: Diagnosis not present

## 2014-07-03 DIAGNOSIS — I10 Essential (primary) hypertension: Secondary | ICD-10-CM | POA: Diagnosis not present

## 2014-07-03 MED ORDER — NITROGLYCERIN 0.4 MG SL SUBL: 0.4000 mg | SUBLINGUAL_TABLET | SUBLINGUAL | Status: AC | PRN

## 2014-07-03 MED ORDER — LOSARTAN POTASSIUM 50 MG PO TABS: 50.0000 mg | ORAL_TABLET | Freq: Every day | ORAL | Status: DC

## 2014-07-03 MED ORDER — PANTOPRAZOLE SODIUM 40 MG PO TBEC: 40.0000 mg | DELAYED_RELEASE_TABLET | Freq: Every day | ORAL | Status: AC

## 2014-07-03 NOTE — Patient Instructions (Addendum)
Medication Instructions:  Your physician has recommended you make the following change in your medication:  1) Decrease Losartan to 50 mg daily 2) Decrease Protonix to 40 mg daily   Labwork: None ordered  Testing/Procedures: None ordered   Follow-Up: See your PCP(Dr. Sharlett Iles) for BP follow up in 4 weeks  Your physician wants you to follow-up in: 6 months with Dr Vallery Ridge will receive a reminder letter in the mail two months in advance. If you don't receive a letter, please call our office to schedule the follow-up appointment.   Any Other Special Instructions Will Be Listed Below (If Applicable).

## 2014-07-03 NOTE — Progress Notes (Signed)
Electrophysiology Office Note   Date:  07/03/2014   ID:  Jose Good Sr., DOB [birth year], MRN [id number]  PCP:  Donnajean Lopes, MD    Chief Complaint  Patient presents with  . Chest Pain     History of Present Illness: Jose CARLILE Sr. is a 81 y.o. male who presents today for electrophysiology evaluation.   Pt last seen by me in 2012.  He has done relatively well.  Presented in early June to [facility] with hypertensive urgency and epigastric pain.  Bp improved with resumption of prior diuretics.  Chest pain was felt to be due to GERD. Hes done very well since he left the hospital.  Feels like he is on "too much medicine" now and would like to reduce losartan.  No further chest pain.   Today, he denies symptoms of palpitations,  shortness of breath, orthopnea, PND, lower extremity edema, claudication, dizziness, presyncope, syncope, bleeding, or neurologic sequela. The patient is tolerating medications without difficulties and is otherwise without complaint today.    Past Medical History  Diagnosis Date  . Hypertension   . Hx of thyroid cancer     s/p total thyroidectomy,  . Hypothyroidism   . Hyperlipidemia   . Bifascicular block 9/10  . Coronary artery disease CARDIOLOGIST- DR Thompson Grayer    LAST VISIT NOTE 08-13-10 AND STRESS TEST 08-17-10 IN EPIC (LOW RISK ,  NO ISCHEMIA)  . History of basal cell carcinoma excision BILATERAL ARMS  . Arthritis     BACK  . GERD (gastroesophageal reflux disease)     CONTROLLED W/ PROTONIX  . Bladder tumor     CYSTO W/ BX SCHEDULED 12-15-10  . Nocturia   . Cancer     hx of bladder and thyroid cancer   . Complication of anesthesia 12/2010    told he had a "stiff neck" during intubation   . Tubular adenoma of colon 04/2002   Past Surgical History  Procedure Laterality Date  . Total thyroidectomy  1991  . Cataract extraction w/ intraocular lens  implant, bilateral    . Cystoscopy with biopsy  12/16/2010    Procedure:  CYSTOSCOPY WITH BIOPSY;  Surgeon: Molli Hazard, MD;  Location: [facility];  Service: Urology;  Laterality: N/A;  . Transurethral resection of bladder tumor  12/16/2010    Procedure: TRANSURETHRAL RESECTION OF BLADDER TUMOR (TURBT);  Surgeon: Molli Hazard, MD;  Location: [facility];  Service: Urology;  Laterality: N/A;  . Cystoscopy/retrograde/ureteroscopy  12/16/2010    Procedure: CYSTOSCOPY/RETROGRADE/URETEROSCOPY;  Surgeon: Molli Hazard, MD;  Location: [facility];  Service: Urology;  Laterality: Right;  bilateral retrograde, right ureteroscopy  . Eye surgery      attached lens to iris     Current Outpatient Prescriptions  Medication Sig Dispense Refill  . amLODipine (NORVASC) 10 MG tablet Take 1 tablet (10 mg total) by mouth daily. 30 tablet 0  . aspirin EC 81 MG tablet Take 81 mg by mouth daily.    [name] atorvastatin (LIPITOR) 20 MG tablet Take 1 tablet by mouth every morning.     . famotidine (PEPCID) 20 MG tablet Take 1 tablet (20 mg total) by mouth 2 (two) times daily. 30 tablet 1  . losartan (COZAAR) 50 MG tablet Take 1 tablet (50 mg total) by mouth daily. 90 tablet 3  . pantoprazole (PROTONIX) 40 MG tablet Take 1 tablet (40 mg total) by mouth daily. 60 tablet 0  .  SYNTHROID 150 MCG tablet Take 1 tablet by mouth every morning.     . testosterone enanthate (DELATESTRYL) 200 MG/ML injection Inject 200 mg into the muscle every 28 (twenty-eight) days. For IM use only    . triamterene-hydrochlorothiazide (DYAZIDE) 37.5-25 MG per capsule Take 1 each (1 capsule total) by mouth daily. 30 capsule 0  . nitroGLYCERIN (NITROSTAT) 0.4 MG SL tablet Place 1 tablet (0.4 mg total) under the tongue every 5 (five) minutes as needed for chest pain. 30 tablet 12   No current facility-administered medications for this visit.    Allergies:   Review of patient's allergies indicates no known allergies.   Social History:  The patient   reports that he quit smoking about 32 years ago. His smoking use included Cigarettes. He has a 70 pack-year smoking history. He has quit using smokeless tobacco. He reports that he drinks alcohol. He reports that he does not use illicit drugs.   Family History:  The patient's  family history includes Diabetes in his father; Lung cancer in his brother; Thyroid cancer in an other family member.    ROS:  Please see the history of present illness.   All other systems are reviewed and negative.    PHYSICAL EXAM: VS:  BP 110/60 mmHg  Pulse 86  Ht 6\' 4"  (1.93 m)  Wt 89.268 kg (196 lb 12.8 oz)  BMI 23.97 kg/m2 , BMI Body mass index is 23.97 kg/(m^2). GEN: Well nourished, well developed, in no acute distress HEENT: normal Neck: no JVD, carotid bruits, or masses Cardiac: RRR; no murmurs, rubs, or gallops,no edema  Respiratory:  clear to auscultation bilaterally, normal work of breathing GI: soft, nontender, nondistended, + BS MS: no deformity or atrophy Skin: warm and dry  Neuro:  Strength and sensation are intact Psych: euthymic mood, full affect  Recent Labs: 06/16/2014: Hemoglobin 15.3; Platelets 257; TSH 3.757 06/18/2014: BUN 16; Creatinine, Ser 1.08; Potassium 3.6; Sodium 137    Lipid Panel     Component Value Date/Time   CHOL 97 06/17/2014 0510   TRIG 59 06/17/2014 0510   HDL 34* 06/17/2014 0510   CHOLHDL 2.9 06/17/2014 0510   VLDL 12 06/17/2014 0510   LDLCALC 51 06/17/2014 0510     Wt Readings from Last 3 Encounters:  07/03/14 89.268 kg (196 lb 12.8 oz)  06/18/14 86.546 kg (190 lb 12.8 oz)  09/04/12 91.627 kg (202 lb)      Other studies Reviewed: Additional studies/ records that were reviewed today include: Dr Rod Mae consult note   ASSESSMENT AND PLAN:  1.  htn Improved Per pts request will reduce losartan to 50mg  daily Follow-up with primary care  2. Chest pain Both typical and atypical features I have offered stress testing.  He declines.  Given advanced age, a  conservative approach is reasonable He will contact my office if his symptoms worsen  Follow-up:  Return in 6 months unless symptoms return Follow-up with PCP as scheduled   Signed, Thompson Grayer, MD  07/03/2014 10:57 AM     [facility] [address] [place] [address] [phone] (office) [phone] (fax)

## 2014-07-19 ENCOUNTER — Encounter: Payer: Self-pay | Admitting: Gastroenterology

## 2014-07-22 ENCOUNTER — Encounter: Payer: Self-pay | Admitting: Gastroenterology

## 2014-07-26 ENCOUNTER — Ambulatory Visit: Payer: Medicare Other | Admitting: Physician Assistant

## 2014-09-11 ENCOUNTER — Other Ambulatory Visit: Payer: Self-pay | Admitting: Urology

## 2014-09-24 ENCOUNTER — Ambulatory Visit: Payer: Medicare Other | Admitting: Gastroenterology

## 2014-09-30 ENCOUNTER — Encounter (HOSPITAL_BASED_OUTPATIENT_CLINIC_OR_DEPARTMENT_OTHER): Payer: Self-pay | Admitting: *Deleted

## 2014-10-01 ENCOUNTER — Encounter (HOSPITAL_BASED_OUTPATIENT_CLINIC_OR_DEPARTMENT_OTHER): Payer: Self-pay | Admitting: *Deleted

## 2014-10-01 NOTE — Progress Notes (Signed)
NPO AFTER MN.  ARRIVE AT 0600. NEEDS ISTAT. CURRENT EKG AND CXR. WILL TAKE AM MEDS W/ EXCEPTION NO TRIAMTERENE/HCTZ DOS W/ SIPS OF WATER.

## 2014-10-07 ENCOUNTER — Encounter (HOSPITAL_BASED_OUTPATIENT_CLINIC_OR_DEPARTMENT_OTHER): Payer: Self-pay | Admitting: Anesthesiology

## 2014-10-07 NOTE — H&P (Signed)
History of Present Illness           F/u - PCP Dr. Philip Aspen     1- Bladder cancer: Dec 2012 LG Ta and CIS. in 2012.  Last upper tract imaging: May 2015 - CT abdomen and pelvis; 10/2010 - CT hematuria protocol - multiple simple renal cysts. B-RPG 12/2010 Last BCG: Feb 2014 - stopped due to BCG fever (fever 103 after BCG 1/2 dose. Urine & blood cultures negative). Jan 2013 completed 3 doses- had Enterococcus infection and subsequent left epididymitis. Had BCG reaction after #4 resulting in severe LUTS.  Last cytology: May 2016 atypical  Last cystoscopy: May 2016 - posterior erythema   2- Renal cysts: Bilateral and do not enhance on CT 10/23/10.    3- BPH: frequency, weak stream, urgency, nocturia Urodynamics 06/2011: small capacity, unstable bladder. 10/20113 - Bladder outlet obstruction: GreenLight PVP  Tried: finasteride (flushing & malaise); Oxybutynin ER 5mg  (No SEs) Tried: rapaflo & avodart - no SEs Currently: nothing -Sept 2015 - severe urinary frequency, every 2 hours. He has feelings of incomplete emptying. No weak stream, dysuria or gross hematuria -Oct 2015 - pvr 32 ml ; frequency q 2 hrs.    4-PCa screening - -May 2016 normal DRE   5- testicular pain - August 2015 - left testicular pain similar to his pain when he had epididymoorchitis May 2015. He was treated with Cipro and NSAIDs. A scrotal ultrasound was normal. Pain in the left testicle that radiates up into the inguinal region. Not assoc with voiding difficulty, dysuria or gross hematuria. Cipro and NSAIDs did not help the pain. Normal exam.  -sept 2015 - tried Neurontin for his inguinal pain/testicular pain. Neurontin, NSAIDs, antibiotics administered difference.  -Nov 2015 - PT  -May 2016 - cord block x 2 Dr. Rosana Hoes. Botox trial Dr. Odis Luster.    6- hypogonadism - Aug 2015 - He's been on testosterone injections for 9 months. He tells me he's had clear symptomatic improvement in his energy, sexual function including  libido and erections.     Aug 2016 interval hx  Patient returns and continue management of bladder cancer. He is being well. No dysuria or gross hematuria.  He underwent a blinded injection for the testicular pain which has improved.   Past Medical History Problems  1. History of hypercholesterolemia (Z86.39) 2. History of hypertension (Z86.79) 3. History of Thyroid Cancer  Surgical History Problems  1. History of Cystoscopy With Biopsy 2. History of Cystoscopy With Ureteroscopy Right 3. History of Laser Vaporization With Transurethral Resection Of Prostate 4. History of Thyroid Surgery Total Thyroidectomy  Current Meds 1. Aspir-81 81 MG Oral Tablet Delayed Release;  Therapy: (Recorded:12Sep2008) to Recorded 2. Atorvastatin Calcium 20 MG Oral Tablet;  Therapy: [id number] to Recorded 3. Ferrex 150 Forte CAPS;  Therapy: (Recorded:02Oct2014) to Recorded 4. Gabapentin 300 MG TABS;  Therapy: (Recorded:09Nov2015) to Recorded 5. Levothyroxine Sodium 150 MCG Oral Tablet;  Therapy: [id number] to Recorded 6. Pantoprazole Sodium 40 MG Oral Tablet Delayed Release;  Therapy: [id number] to Recorded 7. Testosterone Cypionate 200 MG/ML OIL;  Therapy: (Recorded:23Mar2015) to Recorded 8. Triamterene-HCTZ 37.5-25 MG Oral Capsule;  Therapy: [id number] to Recorded  Allergies Medication  1. Doxycycline Hyclate CAPS  Family History Problems  1. Family history of Family Health Status Number Of Children   2 Sons & 1 Daughter 2. Family history of hypertension (Z82.49) : Father  Social History Problems  1. Activities of daily living (ADL's), independent 2. Alcohol Use 3. Caffeine Use   2 4.  Exercise habits   He has been more sedentary with onset of perineal pain.  He was golfing on a regular     basis at least 2 days a week and also mowing his own lawn. 5. Former smoker [phone])   quit 1982 6. Living situation   Is independent at home. 7. Occupation:    Retired  Engineer, site Vital Signs [Data Includes: Last 1 Day]  Recorded: 29Aug2016 08:58AM  Blood Pressure: 128 / 66 Temperature: 98.1 F Heart Rate: 79  Results/Data Urine [Data Includes: Last 1 Day]   29Aug2016  COLOR YELLOW   APPEARANCE CLEAR   SPECIFIC GRAVITY 1.020   pH 6.0   GLUCOSE NEGATIVE   BILIRUBIN NEGATIVE   KETONE NEGATIVE   BLOOD 2+   PROTEIN NEGATIVE   NITRITE NEGATIVE   LEUKOCYTE ESTERASE NEGATIVE   SQUAMOUS EPITHELIAL/HPF 0-5 HPF  WBC 0-5 WBC/HPF  RBC 3-10 RBC/HPF  BACTERIA NONE SEEN HPF  CRYSTALS NONE SEEN HPF  CASTS NONE SEEN LPF  Yeast NONE SEEN HPF   Procedure  Procedure: Cystoscopy   Indication: History of Urothelial Carcinoma.  Informed Consent: Risks, benefits, and potential adverse events were discussed and informed consent was obtained from the patient.  Prep: The patient was prepped with betadine.  Antibiotic prophylaxis: Ciprofloxacin.  Procedure Note:  Urethral meatus:. No abnormalities.  Anterior urethra: No abnormalities.  Prostatic urethra: No abnormalities.  Bladder: Visulization was clear. The ureteral orifices were in the normal anatomic position bilaterally and had clear efflux of urine. A systematic survey of the bladder demonstrated no bladder tumors or stones. The mucosa was smooth without abnormalities. Examination of the bladder demonstrated erythematous mucosa. small area left posterior. Looks stable or smaller to me. Appears benign. Not a dense area. The patient tolerated the procedure well.  Complications: None.    Assessment Assessed  1. 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 UA With REFLEX; [Do Not Release]; Status:Complete;   Done: [id number] 08:29AM Malignant neoplasm of lateral wall of urinary bladder  2. Cysto; Status:Hold For - Appointment,Date of Service; Requested for:29Aug2016;  3. URINE CYTOLOGY; Status:Hold For - Specimen/Data Collection,Appointment;  Requested  for:29Aug2016;  4. Follow-up Month x 4 Office  Follow-up  Status: Hold For - Appointment,Date of Service   Requested for: 29Aug2016  Discussion/Summary   Bladder cancer -  cystoscopy reassuring today. Faint erythema on the left posterior pierced stable. It is not a dense or friable looking area. Discussed with patient and these findings and risk of CIS and the nature risk and benefits of surveillance versus cystoscopy bladder biopsy. He wants to continue surveillance. I did send urine cytology and we will repeat cystoscopy and cytology in a few months.   Signatures Electronically signed by : Festus Aloe, M.D.; Sep 09 2014  9:49AM EST  Addendum: Documentation Notify patient is urine cytology was suspicious for bladder cancer. Therefore we do need to set up the cystoscopy and bladder biopsy likely discussed. I will have Pam call to schedule.   We'll schedule for cystoscopy, bladder biopsy, fulguration, bilateral retrograde pyelogram   Signatures Electronically signed by : Festus Aloe, M.D.; Sep 10 2014  3:04PM EST

## 2014-10-07 NOTE — Anesthesia Preprocedure Evaluation (Addendum)
Anesthesia Evaluation  Patient identified by MRN, date of birth, ID band Patient awake    Reviewed: Allergy & Precautions, NPO status , Patient's Chart, lab work & pertinent test results  History of Anesthesia Complications (+) DIFFICULT AIRWAY and history of anesthetic complications  Airway Mallampati: II  TM Distance: >3 FB Neck ROM: Full    Dental no notable dental hx.    Pulmonary shortness of breath, COPD, former smoker,    Pulmonary exam normal breath sounds clear to auscultation       Cardiovascular Exercise Tolerance: Good hypertension, Pt. on medications + CAD  Normal cardiovascular exam+ dysrhythmias  Rhythm:Regular Rate:Normal     Neuro/Psych negative neurological ROS  negative psych ROS   GI/Hepatic Neg liver ROS, GERD  Medicated,  Endo/Other  Hypothyroidism   Renal/GU negative Renal ROS  negative genitourinary   Musculoskeletal  (+) Arthritis ,   Abdominal   Peds negative pediatric ROS (+)  Hematology negative hematology ROS (+)   Anesthesia Other Findings   Reproductive/Obstetrics negative OB ROS                            Anesthesia Physical Anesthesia Plan  ASA: III  Anesthesia Plan: General   Post-op Pain Management:    Induction: Intravenous  Airway Management Planned: LMA  Additional Equipment:   Intra-op Plan:   Post-operative Plan: Extubation in OR  Informed Consent: I have reviewed the patients History and Physical, chart, labs and discussed the procedure including the risks, benefits and alternatives for the proposed anesthesia with the patient or authorized representative who has indicated his/her understanding and acceptance.   Dental advisory given  Plan Discussed with: CRNA  Anesthesia Plan Comments: (LMA #5 worked 2013)       Anesthesia Quick Evaluation

## 2014-10-08 ENCOUNTER — Ambulatory Visit (HOSPITAL_BASED_OUTPATIENT_CLINIC_OR_DEPARTMENT_OTHER)
Admission: RE | Admit: 2014-10-08 | Discharge: 2014-10-08 | Disposition: A | Discharge status: Home / Self Care | Payer: Medicare Other | Source: Ambulatory Visit | Attending: Urology | Admitting: Urology

## 2014-10-08 ENCOUNTER — Ambulatory Visit (HOSPITAL_BASED_OUTPATIENT_CLINIC_OR_DEPARTMENT_OTHER): Payer: Medicare Other | Admitting: Anesthesiology

## 2014-10-08 ENCOUNTER — Encounter (HOSPITAL_BASED_OUTPATIENT_CLINIC_OR_DEPARTMENT_OTHER): Payer: Self-pay | Admitting: *Deleted

## 2014-10-08 ENCOUNTER — Encounter (HOSPITAL_BASED_OUTPATIENT_CLINIC_OR_DEPARTMENT_OTHER)
Admission: RE | Disposition: A | Discharge status: Home / Self Care | Payer: Self-pay | Source: Ambulatory Visit | Attending: Urology

## 2014-10-08 DIAGNOSIS — I10 Essential (primary) hypertension: Secondary | ICD-10-CM | POA: Diagnosis not present

## 2014-10-08 DIAGNOSIS — N508 Other specified disorders of male genital organs: Secondary | ICD-10-CM | POA: Insufficient documentation

## 2014-10-08 DIAGNOSIS — K219 Gastro-esophageal reflux disease without esophagitis: Secondary | ICD-10-CM | POA: Insufficient documentation

## 2014-10-08 DIAGNOSIS — C679 Malignant neoplasm of bladder, unspecified: Secondary | ICD-10-CM | POA: Insufficient documentation

## 2014-10-08 DIAGNOSIS — Z08 Encounter for follow-up examination after completed treatment for malignant neoplasm: Secondary | ICD-10-CM | POA: Diagnosis present

## 2014-10-08 DIAGNOSIS — E780 Pure hypercholesterolemia: Secondary | ICD-10-CM | POA: Insufficient documentation

## 2014-10-08 DIAGNOSIS — E039 Hypothyroidism, unspecified: Secondary | ICD-10-CM | POA: Diagnosis not present

## 2014-10-08 DIAGNOSIS — M1990 Unspecified osteoarthritis, unspecified site: Secondary | ICD-10-CM | POA: Diagnosis not present

## 2014-10-08 DIAGNOSIS — N401 Enlarged prostate with lower urinary tract symptoms: Secondary | ICD-10-CM | POA: Insufficient documentation

## 2014-10-08 DIAGNOSIS — Z87891 Personal history of nicotine dependence: Secondary | ICD-10-CM | POA: Insufficient documentation

## 2014-10-08 DIAGNOSIS — Z79899 Other long term (current) drug therapy: Secondary | ICD-10-CM | POA: Diagnosis not present

## 2014-10-08 DIAGNOSIS — R350 Frequency of micturition: Secondary | ICD-10-CM | POA: Diagnosis not present

## 2014-10-08 DIAGNOSIS — Z7982 Long term (current) use of aspirin: Secondary | ICD-10-CM | POA: Diagnosis not present

## 2014-10-08 DIAGNOSIS — R3914 Feeling of incomplete bladder emptying: Secondary | ICD-10-CM | POA: Insufficient documentation

## 2014-10-08 DIAGNOSIS — J449 Chronic obstructive pulmonary disease, unspecified: Secondary | ICD-10-CM | POA: Insufficient documentation

## 2014-10-08 DIAGNOSIS — E291 Testicular hypofunction: Secondary | ICD-10-CM | POA: Insufficient documentation

## 2014-10-08 DIAGNOSIS — Z85850 Personal history of malignant neoplasm of thyroid: Secondary | ICD-10-CM | POA: Diagnosis not present

## 2014-10-08 DIAGNOSIS — I2510 Atherosclerotic heart disease of native coronary artery without angina pectoris: Secondary | ICD-10-CM | POA: Insufficient documentation

## 2014-10-08 HISTORY — DX: Presence of spectacles and contact lenses: Z97.3

## 2014-10-08 HISTORY — DX: Personal history of colonic polyps: Z86.010

## 2014-10-08 HISTORY — DX: Chronic obstructive pulmonary disease, unspecified: J44.9

## 2014-10-08 HISTORY — DX: Malignant neoplasm of bladder, unspecified: C67.9

## 2014-10-08 HISTORY — DX: Other disorders of lung: J98.4

## 2014-10-08 HISTORY — DX: Other constipation: K59.09

## 2014-10-08 HISTORY — DX: Postprocedural hypothyroidism: E89.0

## 2014-10-08 HISTORY — DX: Diverticulosis of large intestine without perforation or abscess without bleeding: K57.30

## 2014-10-08 HISTORY — DX: Benign prostatic hyperplasia without lower urinary tract symptoms: N40.0

## 2014-10-08 HISTORY — DX: Unspecified right bundle-branch block: I45.10

## 2014-10-08 HISTORY — PX: CYSTOSCOPY W/ RETROGRADES: SHX1426

## 2014-10-08 LAB — POCT I-STAT 4, (NA,K, GLUC, HGB,HCT)
GLUCOSE: 94 mg/dL (ref 65–99)
HEMATOCRIT: 43 % (ref 39.0–52.0)
Hemoglobin: 14.6 g/dL (ref 13.0–17.0)
POTASSIUM: 4 mmol/L (ref 3.5–5.1)
SODIUM: 141 mmol/L (ref 135–145)

## 2014-10-08 SURGERY — CYSTOSCOPY, WITH RETROGRADE PYELOGRAM
Anesthesia: General | Laterality: Bilateral

## 2014-10-08 MED ORDER — FENTANYL CITRATE (PF) 100 MCG/2ML IJ SOLN
INTRAMUSCULAR | Status: DC | PRN
  Administered 2014-10-08 (×8): 6.25 ug via INTRAVENOUS

## 2014-10-08 MED ORDER — FENTANYL CITRATE (PF) 100 MCG/2ML IJ SOLN
INTRAMUSCULAR | Status: AC
  Filled 2014-10-08: qty 2

## 2014-10-08 MED ORDER — SODIUM CHLORIDE 0.9 % IR SOLN
Status: DC | PRN
  Administered 2014-10-08: 3000 mL via INTRAVESICAL

## 2014-10-08 MED ORDER — LIDOCAINE HCL (CARDIAC) 20 MG/ML IV SOLN
INTRAVENOUS | Status: DC | PRN
  Administered 2014-10-08: 60 mg via INTRAVENOUS

## 2014-10-08 MED ORDER — CEFAZOLIN SODIUM-DEXTROSE 2-3 GM-% IV SOLR
INTRAVENOUS | Status: AC
  Filled 2014-10-08: qty 50

## 2014-10-08 MED ORDER — IOHEXOL 350 MG/ML SOLN
INTRAVENOUS | Status: DC | PRN
  Administered 2014-10-08: 30 mL

## 2014-10-08 MED ORDER — CEFAZOLIN SODIUM-DEXTROSE 2-3 GM-% IV SOLR
2.0000 g | INTRAVENOUS | Status: AC
  Administered 2014-10-08: 2 g via INTRAVENOUS
  Filled 2014-10-08: qty 50

## 2014-10-08 MED ORDER — PROMETHAZINE HCL 25 MG/ML IJ SOLN
6.2500 mg | INTRAMUSCULAR | Status: DC | PRN
  Filled 2014-10-08: qty 1

## 2014-10-08 MED ORDER — CEFAZOLIN SODIUM 1-5 GM-% IV SOLN
1.0000 g | INTRAVENOUS | Status: DC
  Filled 2014-10-08: qty 50

## 2014-10-08 MED ORDER — PROPOFOL 10 MG/ML IV BOLUS
INTRAVENOUS | Status: DC | PRN
  Administered 2014-10-08: 140 mg via INTRAVENOUS

## 2014-10-08 MED ORDER — ACETAMINOPHEN 10 MG/ML IV SOLN
INTRAVENOUS | Status: DC | PRN
  Administered 2014-10-08: 1000 mg via INTRAVENOUS

## 2014-10-08 MED ORDER — LACTATED RINGERS IV SOLN
INTRAVENOUS | Status: DC
  Administered 2014-10-08 (×2): via INTRAVENOUS
  Filled 2014-10-08: qty 1000

## 2014-10-08 MED ORDER — DEXAMETHASONE SODIUM PHOSPHATE 10 MG/ML IJ SOLN
INTRAMUSCULAR | Status: DC | PRN
  Administered 2014-10-08: 4 mg via INTRAVENOUS

## 2014-10-08 MED ORDER — FENTANYL CITRATE (PF) 100 MCG/2ML IJ SOLN
25.0000 ug | INTRAMUSCULAR | Status: DC | PRN
  Filled 2014-10-08: qty 1

## 2014-10-08 MED ORDER — ONDANSETRON HCL 4 MG/2ML IJ SOLN
INTRAMUSCULAR | Status: DC | PRN
  Administered 2014-10-08: 4 mg via INTRAVENOUS

## 2014-10-08 SURGICAL SUPPLY — 30 items
ADAPTER CATH URET PLST 4-6FR (CATHETERS) IMPLANT
BAG DRAIN URO-CYSTO SKYTR STRL (DRAIN) ×2 IMPLANT
BAG URINE DRAINAGE (UROLOGICAL SUPPLIES) ×2 IMPLANT
CATH COUDE FOLEY 2W 5CC 16FR (CATHETERS) ×2 IMPLANT
CATH INTERMIT  6FR 70CM (CATHETERS) ×2 IMPLANT
CATH URET 5FR 28IN CONE TIP (BALLOONS) ×1
CATH URET 5FR 28IN OPEN ENDED (CATHETERS) IMPLANT
CATH URET 5FR 70CM CONE TIP (BALLOONS) ×1 IMPLANT
CATH URET DUAL LUMEN 6-10FR 50 (CATHETERS) IMPLANT
CLOTH BEACON ORANGE TIMEOUT ST (SAFETY) ×2 IMPLANT
ELECT REM PT RETURN 9FT ADLT (ELECTROSURGICAL) ×2
ELECTRODE REM PT RTRN 9FT ADLT (ELECTROSURGICAL) ×1 IMPLANT
FIBER LASER TRAC TIP (UROLOGICAL SUPPLIES) IMPLANT
GLOVE BIO SURGEON STRL SZ 6.5 (GLOVE) ×2 IMPLANT
GLOVE BIO SURGEON STRL SZ7.5 (GLOVE) ×4 IMPLANT
GLOVE BIOGEL PI IND STRL 7.0 (GLOVE) ×1 IMPLANT
GLOVE BIOGEL PI INDICATOR 7.0 (GLOVE) ×1
GOWN STRL REUS W/ TWL XL LVL3 (GOWN DISPOSABLE) ×2 IMPLANT
GOWN STRL REUS W/TWL XL LVL3 (GOWN DISPOSABLE) ×2
GUIDEWIRE 0.038 PTFE COATED (WIRE) IMPLANT
GUIDEWIRE ANG ZIPWIRE 038X150 (WIRE) IMPLANT
GUIDEWIRE STR DUAL SENSOR (WIRE) IMPLANT
HOLDER FOLEY CATH W/STRAP (MISCELLANEOUS) ×2 IMPLANT
KIT BALLIN UROMAX 15FX10 (LABEL) IMPLANT
KIT BALLN UROMAX 15FX4 (MISCELLANEOUS) IMPLANT
KIT BALLN UROMAX 26 75X4 (MISCELLANEOUS)
MANIFOLD NEPTUNE II (INSTRUMENTS) ×2 IMPLANT
PACK CYSTO (CUSTOM PROCEDURE TRAY) ×2 IMPLANT
SET HIGH PRES BAL DIL (LABEL)
SYRINGE IRR TOOMEY STRL 70CC (SYRINGE) IMPLANT

## 2014-10-08 NOTE — Interval H&P Note (Signed)
History and Physical Interval Note:  10/08/2014 7:25 AM  Jose Burly Mcconaghy Sr.  has presented today for surgery, with the diagnosis of bladder cancer  The various methods of treatment have been discussed with the patient and family. After consideration of risks, benefits and other options for treatment, the patient has consented to  Procedure(s): CYSTOSCOPY WITH BILATERAL RETROGRADE PYELOGRAM, BIOPSY, FULGURATION (Bilateral) as a surgical intervention. Discussed possibility of URS/stent if any upper tract abnl. Pt developed urinary retention post-op last time and we discussed keeping foley over night. It's something they want to do.  The patient's history has been reviewed, patient examined, no change in status, stable for surgery.  I have reviewed the patient's chart and labs.  Questions were answered to the patient's satisfaction.     ESKRIDGE, MATTHEW

## 2014-10-08 NOTE — Anesthesia Procedure Notes (Signed)
Procedure Name: LMA Insertion Date/Time: 10/08/2014 7:37 AM Performed by: Justice Rocher Pre-anesthesia Checklist: Patient identified, Emergency Drugs available, Suction available and Patient being monitored Patient Re-evaluated:Patient Re-evaluated prior to inductionOxygen Delivery Method: Circle System Utilized Preoxygenation: Pre-oxygenation with 100% oxygen Intubation Type: IV induction Ventilation: Mask ventilation without difficulty LMA: LMA inserted LMA Size: 5.0 Number of attempts: 1 Airway Equipment and Method: Bite block Placement Confirmation: positive ETCO2 Tube secured with: Tape Dental Injury: Teeth and Oropharynx as per pre-operative assessment

## 2014-10-08 NOTE — Transfer of Care (Signed)
Immediate Anesthesia Transfer of Care Note  Patient: Jose Burly Schramm Sr.  Procedure(s) Performed: Procedure(s) (LRB): CYSTOSCOPY WITH BILATERAL RETROGRADE PYELOGRAM, BIOPSY, FULGURATION (Bilateral)  Patient Location: PACU  Anesthesia Type: General  Level of Consciousness: awake, sedated, patient cooperative and responds to stimulation  Airway & Oxygen Therapy: Patient Spontanous Breathing and Patient connected to face mask oxygen  Post-op Assessment: Report given to PACU RN, Post -op Vital signs reviewed and stable and Patient moving all extremities  Post vital signs: Reviewed and stable  Complications: No apparent anesthesia complications

## 2014-10-08 NOTE — Anesthesia Postprocedure Evaluation (Signed)
  Anesthesia Post-op Note  Patient: Jose S Standen Sr.  Procedure(s) Performed: Procedure(s) (LRB): CYSTOSCOPY WITH BILATERAL RETROGRADE PYELOGRAM, BIOPSY, FULGURATION (Bilateral)  Patient Location: PACU  Anesthesia Type: General  Level of Consciousness: awake and alert   Airway and Oxygen Therapy: Patient Spontanous Breathing  Post-op Pain: mild  Post-op Assessment: Post-op Vital signs reviewed, Patient's Cardiovascular Status Stable, Respiratory Function Stable, Patent Airway and No signs of Nausea or vomiting  Last Vitals:  Filed Vitals:   10/08/14 1020  BP: 137/65  Pulse: 66  Temp: 36.6 C  Resp: 16    Post-op Vital Signs: stable   Complications: No apparent anesthesia complications

## 2014-10-08 NOTE — Discharge Instructions (Signed)
Foley Catheter Care A Foley catheter is a soft, flexible tube that is placed into the bladder to drain urine. A Foley catheter may be inserted if:  You leak urine or are not able to control when you urinate (urinary incontinence).  You are not able to urinate when you need to (urinary retention).  You had prostate surgery or surgery on the genitals.  You have certain medical conditions, such as multiple sclerosis, dementia, or a spinal cord injury. If you are going home with a Foley catheter in place, follow the instructions below.  REMOVAL OF THE CATHETER - remove the catheter as instructed early on Wednesday, 10/09/2014.  TAKING CARE OF THE CATHETER 1. Wash your hands with soap and water. 2. Using mild soap and warm water on a clean washcloth:  Clean the area on your body closest to the catheter insertion site using a circular motion, moving away from the catheter. Never wipe toward the catheter because this could sweep bacteria up into the urethra and cause infection.  Remove all traces of soap. Pat the area dry with a clean towel. For males, reposition the foreskin. 3. Attach the catheter to your leg so there is no tension on the catheter. Use adhesive tape or a leg strap. If you are using adhesive tape, remove any sticky residue left behind by the previous tape you used. 4. Keep the drainage bag below the level of the bladder, but keep it off the floor. 5. Check throughout the day to be sure the catheter is working and urine is draining freely. Make sure the tubing does not become kinked. 6. Do not pull on the catheter or try to remove it. Pulling could damage internal tissues. TAKING CARE OF THE DRAINAGE BAGS You will be given two drainage bags to take home. One is a large overnight drainage bag, and the other is a smaller leg bag that fits underneath clothing. You may wear the overnight bag at any time, but you should never wear the smaller leg bag at night. Follow the instructions  below for how to empty, change, and clean your drainage bags. Emptying the Drainage Bag You must empty your drainage bag when it is  - full or at least 2-3 times a day. 1. Wash your hands with soap and water. 2. Keep the drainage bag below your hips, below the level of your bladder. This stops urine from going back into the tubing and into your bladder. 3. Hold the dirty bag over the toilet or a clean container. 4. Open the pour spout at the bottom of the bag and empty the urine into the toilet or container. Do not let the pour spout touch the toilet, container, or any other surface. Doing so can place bacteria on the bag, which can cause an infection. 5. Clean the pour spout with a gauze pad or cotton ball that has rubbing alcohol on it. 6. Close the pour spout. 7. Attach the bag to your leg with adhesive tape or a leg strap. 8. Wash your hands well.  PREVENTING INFECTION  Wash your hands before and after handling your catheter.  Take showers daily and wash the area where the catheter enters your body. Do not take baths. Replace wet leg straps with dry ones, if this applies.  Do not use powders, sprays, or lotions on the genital area. Only use creams, lotions, or ointments as directed by your caregiver.  For females, wipe from front to back after each bowel movement.  Drink  enough fluids to keep your urine clear or pale yellow unless you have a fluid restriction.  Do not let the drainage bag or tubing touch or lie on the floor.  Wear cotton underwear to absorb moisture and to keep your skin drier. SEEK MEDICAL CARE IF:   Your urine is cloudy or smells unusually bad.  Your catheter becomes clogged.  You are not draining urine into the bag or your bladder feels full.  Your catheter starts to leak. SEEK IMMEDIATE MEDICAL CARE IF:   You have pain, swelling, redness, or pus where the catheter enters the body.  You have pain in the abdomen, legs, lower back, or bladder.  You  have a fever.  You see blood fill the catheter, or your urine is pink or red.  You have nausea, vomiting, or chills.  Your catheter gets pulled out. MAKE SURE YOU:   Understand these instructions.  Will watch your condition.  Will get help right away if you are not doing well or get worse. Document Released: 12/28/2004 Document Revised: 05/14/2013 Document Reviewed: 12/20/2011 [facility] Patient Information 2015 [place], [place]. This information is not intended to replace advice given to you by your health care provider. Make sure you discuss any questions you have with your health care provider.   Cystoscopy, Care After Refer to this sheet in the next few weeks. These instructions provide you with information on caring for yourself after your procedure. Your caregiver may also give you more specific instructions. Your treatment has been planned according to current medical practices, but problems sometimes occur. Call your caregiver if you have any problems or questions after your procedure. HOME CARE INSTRUCTIONS  Things you can do to ease any discomfort after your procedure include:  Drinking enough water and fluids to keep your urine clear or pale yellow.  Taking a warm bath to relieve any burning feelings. SEEK IMMEDIATE MEDICAL CARE IF:  7. You have an increase in blood in your urine. 8. You notice blood clots in your urine. 9. You have difficulty passing urine. 10. You have the chills. 11. You have abdominal pain. 12. You have a fever or persistent symptoms for more than 2-3 days. 13. You have a fever and your symptoms suddenly get worse. MAKE SURE YOU:  9. Understand these instructions. 10. Will watch your condition. 11. Will get help right away if you are not doing well or get worse. Document Released: 07/17/2004 Document Revised: 08/30/2012 Document Reviewed: 06/21/2011 [facility] Patient Information 2015 [place], [place]. This information is not intended to replace advice  given to you by your health care provider. Make sure you discuss any questions you have with your health care provider.  Post Anesthesia Home Care Instructions  Activity: Get plenty of rest for the remainder of the day. A responsible adult should stay with you for 24 hours following the procedure.  For the next 24 hours, DO NOT: -Drive a car -Paediatric nurse -Drink alcoholic beverages -Take any medication unless instructed by your physician -Make any legal decisions or sign important papers.  Meals: Start with liquid foods such as gelatin or soup. Progress to regular foods as tolerated. Avoid greasy, spicy, heavy foods. If nausea and/or vomiting occur, drink only clear liquids until the nausea and/or vomiting subsides. Call your physician if vomiting continues.  Special Instructions/Symptoms: Your throat may feel dry or sore from the anesthesia or the breathing tube placed in your throat during surgery. If this causes discomfort, gargle with warm salt water. The discomfort  should disappear within 24 hours.  If you had a scopolamine patch placed behind your ear for the management of post- operative nausea and/or vomiting:  1. The medication in the patch is effective for 72 hours, after which it should be removed.  Wrap patch in a tissue and discard in the trash. Wash hands thoroughly with soap and water. 2. You may remove the patch earlier than 72 hours if you experience unpleasant side effects which may include dry mouth, dizziness or visual disturbances. 3. Avoid touching the patch. Wash your hands with soap and water after contact with the patch.    Post Anesthesia Home Care Instructions  Activity: Get plenty of rest for the remainder of the day. A responsible adult should stay with you for 24 hours following the procedure.  For the next 24 hours, DO NOT: -Drive a car -Paediatric nurse -Drink alcoholic beverages -Take any medication unless instructed by your physician -Make  any legal decisions or sign important papers.  Meals: Start with liquid foods such as gelatin or soup. Progress to regular foods as tolerated. Avoid greasy, spicy, heavy foods. If nausea and/or vomiting occur, drink only clear liquids until the nausea and/or vomiting subsides. Call your physician if vomiting continues.  Special Instructions/Symptoms: Your throat may feel dry or sore from the anesthesia or the breathing tube placed in your throat during surgery. If this causes discomfort, gargle with warm salt water. The discomfort should disappear within 24 hours.  If you had a scopolamine patch placed behind your ear for the management of post- operative nausea and/or vomiting:  1. The medication in the patch is effective for 72 hours, after which it should be removed.  Wrap patch in a tissue and discard in the trash. Wash hands thoroughly with soap and water. 2. You may remove the patch earlier than 72 hours if you experience unpleasant side effects which may include dry mouth, dizziness or visual disturbances. 3. Avoid touching the patch. Wash your hands with soap and water after contact with the patch.

## 2014-10-08 NOTE — Op Note (Signed)
Preoperative diagnosis: History of bladder cancer, suspicious cytology, bladder neoplasm Postoperative diagnosis: Same  Procedure: Exam under anesthesia, cystoscopy, bladder biopsy with fulguration 2-5 cm, bilateral retrograde pyelograms  Surgeon: Junious Silk  Type of anesthesia: Gen.  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 Cytology suspicious for urothelial malignancy. He was brought for bladder biopsies and fulguration.  Exam under anesthesia-the penis was normal without mass or lesion. Testicles were descended bilaterally and palpably normal. On digital rectal exam the prostate was small and benign.  On cystoscopy the urethra appeared normal. The prostatic urethra had a good TURP defect. The trigone and ureteral orifices were in their normal orthotopic position with clear efflux. There was an erythematous patch of urothelium of about 3 x 3 cm medial and just superior to the left ureteral orifice. The bladder was heavily trabeculated.  Left retrograde pyelogram-this outlined a single ureter single collecting system unit without filling defect, stricture or dilation. There was brisk drainage.  Right retrograde pyelogram-this outlined a single ureter single collecting system unit without filling defect, stricture or dilation. There was brisk drainage.  Description of procedure: After consent was obtained patient brought to the operating room. After adequate anesthesia he is placed in lithotomy position and prepped and draped in the usual sterile fashion. A timeout was performed to confirm the patient and procedure. An exam under anesthesia was performed. The cystoscope was passed per urethra and the bladder carefully inspected with the 30 and 70 lens. A cone-tipped catheter was used to inject contrast in the left ureteral orifice followed by the right ureteral orifice. Scout and  spot fluoroscopic images were obtained. Scout images were unremarkable. The erythematous area of mucosa left posterior bladder was biopsied 2 with a flexible biopsy forceps. The entire patch was then fulgurated. Hemostasis was insured at low-pressure. The bladder was refilled and the scope removed. A 16 French coud catheter was placed and left gravity drainage. The urine was clear. The patient was awakened taken to the recovery room in stable condition.  Complications: None  Blood loss: Minimal  Specimens: Left posterior bladder biopsy 2 sent to pathology  Drains: 16 French coud catheter.

## 2014-10-09 ENCOUNTER — Encounter (HOSPITAL_BASED_OUTPATIENT_CLINIC_OR_DEPARTMENT_OTHER): Payer: Self-pay | Admitting: Urology

## 2014-11-12 HISTORY — PX: KNEE ARTHROSCOPY: SUR90

## 2015-03-13 DIAGNOSIS — E298 Other testicular dysfunction: Secondary | ICD-10-CM | POA: Diagnosis not present

## 2015-03-25 DIAGNOSIS — Z4789 Encounter for other orthopedic aftercare: Secondary | ICD-10-CM | POA: Diagnosis not present

## 2015-04-17 DIAGNOSIS — E298 Other testicular dysfunction: Secondary | ICD-10-CM | POA: Diagnosis not present

## 2015-04-22 DIAGNOSIS — M1712 Unilateral primary osteoarthritis, left knee: Secondary | ICD-10-CM | POA: Diagnosis not present

## 2015-04-22 DIAGNOSIS — Z4789 Encounter for other orthopedic aftercare: Secondary | ICD-10-CM | POA: Diagnosis not present

## 2015-04-24 DIAGNOSIS — H353112 Nonexudative age-related macular degeneration, right eye, intermediate dry stage: Secondary | ICD-10-CM | POA: Diagnosis not present

## 2015-04-24 DIAGNOSIS — H1820 Unspecified corneal edema: Secondary | ICD-10-CM | POA: Diagnosis not present

## 2015-04-24 DIAGNOSIS — Z961 Presence of intraocular lens: Secondary | ICD-10-CM | POA: Diagnosis not present

## 2015-05-02 ENCOUNTER — Encounter (HOSPITAL_COMMUNITY): Payer: Self-pay

## 2015-05-02 ENCOUNTER — Emergency Department (HOSPITAL_COMMUNITY)
Admission: EM | Admit: 2015-05-02 | Discharge: 2015-05-02 | Disposition: A | Discharge status: Home / Self Care | Payer: Medicare Other | Source: Home / Self Care | Attending: Emergency Medicine | Admitting: Emergency Medicine

## 2015-05-02 ENCOUNTER — Emergency Department (HOSPITAL_COMMUNITY)
Admission: EM | Admit: 2015-05-02 | Discharge: 2015-05-02 | Disposition: A | Discharge status: Home / Self Care | Payer: Medicare Other | Attending: Emergency Medicine | Admitting: Emergency Medicine

## 2015-05-02 ENCOUNTER — Encounter (HOSPITAL_COMMUNITY): Payer: Self-pay | Admitting: *Deleted

## 2015-05-02 ENCOUNTER — Emergency Department (HOSPITAL_COMMUNITY): Payer: Medicare Other

## 2015-05-02 DIAGNOSIS — J441 Chronic obstructive pulmonary disease with (acute) exacerbation: Secondary | ICD-10-CM | POA: Insufficient documentation

## 2015-05-02 DIAGNOSIS — Z7982 Long term (current) use of aspirin: Secondary | ICD-10-CM | POA: Insufficient documentation

## 2015-05-02 DIAGNOSIS — Z86010 Personal history of colonic polyps: Secondary | ICD-10-CM | POA: Insufficient documentation

## 2015-05-02 DIAGNOSIS — Z8551 Personal history of malignant neoplasm of bladder: Secondary | ICD-10-CM | POA: Insufficient documentation

## 2015-05-02 DIAGNOSIS — Z87891 Personal history of nicotine dependence: Secondary | ICD-10-CM

## 2015-05-02 DIAGNOSIS — K5900 Constipation, unspecified: Secondary | ICD-10-CM | POA: Insufficient documentation

## 2015-05-02 DIAGNOSIS — M1990 Unspecified osteoarthritis, unspecified site: Secondary | ICD-10-CM | POA: Insufficient documentation

## 2015-05-02 DIAGNOSIS — Z85828 Personal history of other malignant neoplasm of skin: Secondary | ICD-10-CM | POA: Insufficient documentation

## 2015-05-02 DIAGNOSIS — J449 Chronic obstructive pulmonary disease, unspecified: Secondary | ICD-10-CM

## 2015-05-02 DIAGNOSIS — K219 Gastro-esophageal reflux disease without esophagitis: Secondary | ICD-10-CM | POA: Insufficient documentation

## 2015-05-02 DIAGNOSIS — E785 Hyperlipidemia, unspecified: Secondary | ICD-10-CM | POA: Insufficient documentation

## 2015-05-02 DIAGNOSIS — I2510 Atherosclerotic heart disease of native coronary artery without angina pectoris: Secondary | ICD-10-CM | POA: Insufficient documentation

## 2015-05-02 DIAGNOSIS — Z791 Long term (current) use of non-steroidal anti-inflammatories (NSAID): Secondary | ICD-10-CM | POA: Insufficient documentation

## 2015-05-02 DIAGNOSIS — Z87438 Personal history of other diseases of male genital organs: Secondary | ICD-10-CM | POA: Insufficient documentation

## 2015-05-02 DIAGNOSIS — Z79899 Other long term (current) drug therapy: Secondary | ICD-10-CM

## 2015-05-02 DIAGNOSIS — E039 Hypothyroidism, unspecified: Secondary | ICD-10-CM | POA: Insufficient documentation

## 2015-05-02 DIAGNOSIS — I10 Essential (primary) hypertension: Secondary | ICD-10-CM | POA: Insufficient documentation

## 2015-05-02 DIAGNOSIS — Z85850 Personal history of malignant neoplasm of thyroid: Secondary | ICD-10-CM

## 2015-05-02 DIAGNOSIS — R0602 Shortness of breath: Secondary | ICD-10-CM | POA: Diagnosis not present

## 2015-05-02 DIAGNOSIS — R1013 Epigastric pain: Secondary | ICD-10-CM

## 2015-05-02 LAB — URINALYSIS, ROUTINE W REFLEX MICROSCOPIC
BILIRUBIN URINE: NEGATIVE
Glucose, UA: NEGATIVE mg/dL
Hgb urine dipstick: NEGATIVE
KETONES UR: NEGATIVE mg/dL
LEUKOCYTES UA: NEGATIVE
NITRITE: NEGATIVE
PH: 6 (ref 5.0–8.0)
Protein, ur: NEGATIVE mg/dL
SPECIFIC GRAVITY, URINE: 1.011 (ref 1.005–1.030)

## 2015-05-02 LAB — CBC
HEMATOCRIT: 44.9 % (ref 39.0–52.0)
HEMOGLOBIN: 14.3 g/dL (ref 13.0–17.0)
MCH: 28 pg (ref 26.0–34.0)
MCHC: 31.8 g/dL (ref 30.0–36.0)
MCV: 87.9 fL (ref 78.0–100.0)
Platelets: 339 10*3/uL (ref 150–400)
RBC: 5.11 MIL/uL (ref 4.22–5.81)
RDW: 17.5 % — AB (ref 11.5–15.5)
WBC: 5.9 10*3/uL (ref 4.0–10.5)

## 2015-05-02 LAB — I-STAT TROPONIN, ED
TROPONIN I, POC: 0.01 ng/mL (ref 0.00–0.08)
Troponin i, poc: 0 ng/mL (ref 0.00–0.08)

## 2015-05-02 LAB — COMPREHENSIVE METABOLIC PANEL
ALK PHOS: 60 U/L (ref 38–126)
ALT: 17 U/L (ref 17–63)
ANION GAP: 9 (ref 5–15)
AST: 18 U/L (ref 15–41)
Albumin: 3.8 g/dL (ref 3.5–5.0)
BILIRUBIN TOTAL: 1.1 mg/dL (ref 0.3–1.2)
BUN: 21 mg/dL — ABNORMAL HIGH (ref 6–20)
CALCIUM: 9.4 mg/dL (ref 8.9–10.3)
CO2: 27 mmol/L (ref 22–32)
Chloride: 101 mmol/L (ref 101–111)
Creatinine, Ser: 1.46 mg/dL — ABNORMAL HIGH (ref 0.61–1.24)
GFR calc Af Amer: 48 mL/min — ABNORMAL LOW (ref >60)
GFR, EST NON AFRICAN AMERICAN: 41 mL/min — AB (ref >60)
Glucose, Bld: 98 mg/dL (ref 65–99)
POTASSIUM: 5 mmol/L (ref 3.5–5.1)
Sodium: 137 mmol/L (ref 135–145)
TOTAL PROTEIN: 6.7 g/dL (ref 6.5–8.1)

## 2015-05-02 LAB — TROPONIN I

## 2015-05-02 LAB — LIPASE, BLOOD: Lipase: 23 U/L (ref 11–51)

## 2015-05-02 LAB — D-DIMER, QUANTITATIVE: D-Dimer, Quant: 0.31 ug/mL-FEU (ref 0.00–0.50)

## 2015-05-02 MED ORDER — GI COCKTAIL ~~LOC~~
30.0000 mL | Freq: Once | ORAL | Status: AC
Start: 2015-05-02 — End: 2015-05-02
  Administered 2015-05-02: 30 mL via ORAL
  Filled 2015-05-02: qty 30

## 2015-05-02 MED ORDER — GI COCKTAIL ~~LOC~~
30.0000 mL | Freq: Once | ORAL | Status: AC
  Administered 2015-05-02: 30 mL via ORAL
  Filled 2015-05-02: qty 30

## 2015-05-02 MED ORDER — SODIUM CHLORIDE 0.9 % IV SOLN
Freq: Once | INTRAVENOUS | Status: AC
  Administered 2015-05-02: 11:00:00 via INTRAVENOUS

## 2015-05-02 MED ORDER — OMEPRAZOLE 20 MG PO CPDR: DELAYED_RELEASE_CAPSULE | ORAL | Status: DC

## 2015-05-02 NOTE — Discharge Instructions (Signed)
Indigestion °Indigestion is a feeling of pain, discomfort, burning, or fullness in the upper part of your abdomen. It can come and go. It may occur frequently or rarely. Indigestion tends to occur while you are eating or right after you have finished eating. It may be worse at night and while bending over or lying down. °HOME CARE INSTRUCTIONS °Take these actions to decrease your pain or discomfort and to help avoid complications. °Diet °· Follow a diet as recommended by your health care provider. This may involve avoiding foods and drinks such as: °¨ Coffee and tea (with or without caffeine). °¨ Drinks that contain alcohol. °¨ Energy drinks and sports drinks. °¨ Carbonated drinks or sodas. °¨ Chocolate and cocoa. °¨ Peppermint and mint flavorings. °¨ Garlic and onions. °¨ Horseradish. °¨ Spicy and acidic foods, including peppers, chili powder, curry powder, vinegar, hot sauces, and barbecue sauce. °¨ Citrus fruit juices and citrus fruits, such as oranges, lemons, and limes. °¨ Tomato-based foods, such as red sauce, chili, salsa, and pizza with red sauce. °¨ Fried and fatty foods, such as donuts, french fries, potato chips, and high-fat dressings. °¨ High-fat meats, such as hot dogs and fatty cuts of red and white meats, such as rib eye steak, sausage, ham, and bacon. °¨ High-fat dairy items, such as whole milk, butter, and cream cheese. °· Eat small, frequent meals instead of large meals. °· Avoid drinking large amounts of liquid with your meals. °· Avoid eating meals during the 2-3 hours before bedtime. °· Avoid lying down right after you eat. °· Do not exercise right after you eat. °General Instructions °· Pay attention to any changes in your symptoms. °· Take over-the-counter and prescription medicines only as told by your health care provider. Do not take aspirin, ibuprofen, or other NSAIDs unless your health care provider told you to do so. °· Do not use any tobacco products, including cigarettes, chewing  tobacco, and e-cigarettes. If you need help quitting, ask your health care provider. °· Wear loose-fitting clothing. Do not wear anything tight around your waist that causes pressure on your abdomen. °· Raise (elevate) the head of your bed about 6 inches (15 cm). °· Try to reduce your stress, such as with yoga or meditation. If you need help reducing stress, ask your health care provider. °· If you are overweight, reduce your weight to an amount that is healthy for you. Ask your health care provider for guidance about a safe weight loss goal. °· Keep all follow-up visits as told by your health care provider. This is important. °SEEK MEDICAL CARE IF: °· You have new symptoms. °· You have unexplained weight loss. °· You have difficulty swallowing, or it hurts to swallow. °· Your symptoms do not improve with treatment. °· Your symptoms last for more than two days. °· You have a fever. °· You vomit. °SEEK IMMEDIATE MEDICAL CARE IF: °· You have pain in your arms, neck, jaw, teeth, or back. °· You feel sweaty, dizzy, or light-headed. °· You faint. °· You have chest pain or shortness of breath. °· You cannot stop vomiting, or you vomit blood. °· Your stool is bloody or black. °· You have severe pain in your abdomen. °  °This information is not intended to replace advice given to you by your health care provider. Make sure you discuss any questions you have with your health care provider. °  °Document Released: 02/05/2004 Document Revised: 09/18/2014 Document Reviewed: 04/24/2014 °Elsevier Interactive Patient Education ©2016 [employer] ° °

## 2015-05-02 NOTE — ED Provider Notes (Signed)
CSN: [id number]     Arrival date & time 05/02/15  1649 History   First MD Initiated Contact with Patient 05/02/15 2042     Chief Complaint  Patient presents with  . Abdominal Pain     (Consider location/radiation/quality/duration/timing/severity/associated sxs/prior Treatment) Patient is a 82 y.o. male presenting with abdominal pain. The history is provided by the patient and the spouse. No language interpreter was used.  Abdominal Pain Pain location:  Epigastric Pain quality: sharp   Pain radiates to:  Does not radiate Pain severity:  Moderate Associated symptoms: no chills, no fever, no nausea and no vomiting   Associated symptoms comment:  Patient returns to the ED with spouse after being seen this morning for epigastric pain. Lab studies and exam this morning supported a diagnosis of reflux. He went home and had a milk shake and then had recurrence of the epigastric pain, prompting return to the ED. No fever. He states that since arrival here and the 5 hours waiting to be seen, the pain has again subsided and is minimal at this time. No vomiting.    Past Medical History  Diagnosis Date  . Hypertension   . Hx of thyroid cancer     s/p total thyroidectomy,  . Hypothyroidism   . Hyperlipidemia   . History of basal cell carcinoma excision BILATERAL ARMS  . Arthritis     BACK  . Nocturia   . Coronary artery disease CARDIOLOGIST- DR Thompson Grayer    STRESS TEST 08-17-10 IN [facility] (LOW RISK ,  NO ISCHEMIA)  . Bifascicular block   . Right bundle branch block   . Hypothyroidism, postsurgical   . GERD (gastroesophageal reflux disease)   . Bladder cancer ([place])   . History of colon polyps   . Sigmoid diverticulosis   . BPH (benign prostatic hyperplasia)   . COPD mixed type ([place])   . Chronic restrictive lung disease   . Complication of anesthesia 12/2010    told he had a "stiff neck" during intubation   . Chronic constipation   . Wears glasses    Past Surgical History  Procedure  Laterality Date  . Total thyroidectomy  1991  . Cataract extraction w/ intraocular lens  implant, bilateral    . Cystoscopy with biopsy  12/16/2010    Procedure: CYSTOSCOPY WITH BIOPSY;  Surgeon: Molli Hazard, MD;  [facility];  Service: Urology;  Laterality: N/A;  . Transurethral resection of bladder tumor  12/16/2010    Procedure: TRANSURETHRAL RESECTION OF BLADDER TUMOR (TURBT);  Surgeon: Molli Hazard, MD;  Location: [facility];  Service: Urology;  Laterality: N/A;  . Cystoscopy/retrograde/ureteroscopy  12/16/2010    Procedure: CYSTOSCOPY/RETROGRADE/URETEROSCOPY;  Surgeon: Molli Hazard, MD;  Location: [facility];  Service: Urology;  Laterality: Right;  bilateral retrograde, right ureteroscopy  . Eye surgery      attached lens to iris  . Green light laser turp (transurethral resection of prostate  11-02-2011  . Cardiovascular stress test  08-17-2010   dr allred    Low risk nuclear study with prominent inferobasal thinning vs small prior infarct, no ischemia/  normal LV function and wall motion , ef 63%  . Cystoscopy w/ retrogrades Bilateral 10/08/2014    Procedure: CYSTOSCOPY WITH BILATERAL RETROGRADE PYELOGRAM, BIOPSY, FULGURATION;  Surgeon: Festus Aloe, MD;  Location: [facility];  Service: Urology;  Laterality: Bilateral;   Family History  Problem Relation Age of Onset  . Lung cancer Brother   .  Thyroid cancer    . Diabetes Father    Social History  Substance Use Topics  . Smoking status: Former Smoker -- 2.00 packs/day for 35 years    Types: Cigarettes    Quit date: 01/11/1982  . Smokeless tobacco: Former Systems developer  . Alcohol Use: 0.0 oz/week     Comment: 6-oz vodka per day     Review of Systems  Constitutional: Negative for fever and chills.  HENT: Negative.   Respiratory: Negative.   Cardiovascular: Negative.   Gastrointestinal: Positive for abdominal pain. Negative for nausea  and vomiting.  Musculoskeletal: Negative.   Skin: Negative.   Neurological: Negative.       Allergies  Review of patient's allergies indicates no known allergies.  Home Medications   Prior to Admission medications   Medication Sig Start Date End Date Taking? Authorizing Provider  aspirin EC 81 MG tablet Take 81 mg by mouth daily.   Yes Historical Provider, MD  atorvastatin (LIPITOR) 20 MG tablet Take 1 tablet by mouth every morning.    Yes Historical Provider, MD  buPROPion (WELLBUTRIN) 100 MG tablet Take 100 mg by mouth daily.   Yes Historical Provider, MD  Diclofenac Sodium (PENNSAID TD) Place 1 application onto the skin daily. Applies to left knee daily   Yes Historical Provider, MD  famotidine (PEPCID AC) 10 MG chewable tablet Chew 10 mg by mouth 2 (two) times daily as needed for heartburn.   Yes Historical Provider, MD  levothyroxine (SYNTHROID, LEVOTHROID) 175 MCG tablet Take 175 mcg by mouth daily before breakfast.   Yes Historical Provider, MD  Linaclotide (LINZESS) 145 MCG CAPS capsule Take 145 mcg by mouth every morning.   Yes Historical Provider, MD  losartan (COZAAR) 50 MG tablet Take 1 tablet (50 mg total) by mouth daily. Patient taking differently: Take 50 mg by mouth every morning.  07/03/14  Yes Thompson Grayer, MD  nitroGLYCERIN (NITROSTAT) 0.4 MG SL tablet Place 1 tablet (0.4 mg total) under the tongue every 5 (five) minutes as needed for chest pain. 07/03/14  Yes Thompson Grayer, MD  pantoprazole (PROTONIX) 40 MG tablet Take 1 tablet (40 mg total) by mouth daily. Patient taking differently: Take 40 mg by mouth every morning.  07/03/14  Yes Thompson Grayer, MD  testosterone enanthate (DELATESTRYL) 200 MG/ML injection Inject 200 mg into the muscle every 28 (twenty-eight) days. For IM use only   Yes Historical Provider, MD  triamterene-hydrochlorothiazide (DYAZIDE) 37.5-25 MG per capsule Take 1 each (1 capsule total) by mouth daily. Patient taking differently: Take 1 capsule by mouth  every morning.  06/18/14  Yes Modena Jansky, MD  vitamin B-12 (CYANOCOBALAMIN) 500 MCG tablet Take 500 mcg by mouth daily.   Yes Historical Provider, MD  Vitamin D, Ergocalciferol, (DRISDOL) 50000 units CAPS capsule Take 50,000 Units by mouth every 7 (seven) days. Take on Mondays   Yes Historical Provider, MD  omeprazole (PRILOSEC) 20 MG capsule Take one tablet twice daily for 5 days, then once daily after that 05/02/15   Nehemiah Settle Raye Slyter, PA-C   BP 156/66 mmHg  Pulse 69  Temp(Src) 98 F (36.7 C) (Oral)  Resp 14  SpO2 98% Physical Exam  Constitutional: He is oriented to person, place, and time. He appears well-developed and well-nourished.  HENT:  Head: Normocephalic.  Neck: Normal range of motion. Neck supple.  Cardiovascular: Normal rate and regular rhythm.   Pulmonary/Chest: Effort normal and breath sounds normal. He has no wheezes. He has no rales.  Abdominal: Soft. Bowel  sounds are normal. There is no tenderness. There is no rebound and no guarding.  Musculoskeletal: Normal range of motion.  Neurological: He is alert and oriented to person, place, and time.  Skin: Skin is warm and dry. No rash noted.  Psychiatric: He has a normal mood and affect.    ED Course  Procedures (including critical care time) Labs Review Labs Reviewed  Randolm Idol, ED    Imaging Review Dg Chest 2 View  05/02/2015  CLINICAL DATA:  Shortness of breath and chest pain for 1 day EXAM: CHEST  2 VIEW COMPARISON:  June 16, 2014 FINDINGS: There is mild scarring in the right base. There is no edema or consolidation. Heart size and pulmonary vascularity are normal. There is atherosclerotic calcification in the aorta. There are postoperative changes in the lower neck region. IMPRESSION: Scarring right base. No edema or consolidation. Stable cardiac silhouette. Electronically Signed   By: Lowella Grip III M.D.   On: 05/02/2015 10:16   I have personally reviewed and evaluated these images and lab results as  part of my medical decision-making.   EKG Interpretation None      MDM   Final diagnoses:  Dyspepsia    The patient appears quite comfortable after return to the ED for recurrent pain limited to epigastrium. Labs from previous visit today reviewed. Troponin repeated tonight normal. He is examined by Dr. Stark Jock and found appropriate for discharge home.  He currently takes protonix for reflux. Advised to increase dose to twice daily and follow up with his doctor next week for recheck.     Charlann Lange, PA-C 05/03/15 [id number]  Veryl Speak, MD 05/04/15 [phone]

## 2015-05-02 NOTE — ED Provider Notes (Signed)
CSN: [id number]     Arrival date & time 05/02/15  [id number] History   First MD Initiated Contact with Patient 05/02/15 [phone]     Chief Complaint  Patient presents with  . Abdominal Pain  . Shortness of Breath     (Consider location/radiation/quality/duration/timing/severity/associated sxs/prior Treatment) Patient is a 82 y.o. male presenting with abdominal pain and shortness of breath. The history is provided by the patient.  Abdominal Pain Pain location:  Epigastric Pain quality: burning   Pain radiates to:  Does not radiate Pain severity:  Mild Onset quality:  Sudden Progression:  Improving Relieved by:  Antacids Associated symptoms: shortness of breath   Risk factors: being elderly   Shortness of Breath Associated symptoms: abdominal pain    PT WAS FEELING VERY "GASSY" YESTERDAY.  HE GAVE HIMSELF AN ENEMA WHICH HELPED.  THE PT SAID THAT HE DID NOT SLEEP WELL LAST NIGHT AND HAD SOME EPIGASTRIC PAIN AND SOB THIS AM.  THE PT SAID THAT HE TOOK PEPCID,ASA, AND AN ANTACID WITHOUT RELIEF, BUT PAIN IS IMPROVING NOW. Past Medical History  Diagnosis Date  . Hypertension   . Hx of thyroid cancer     s/p total thyroidectomy,  . Hypothyroidism   . Hyperlipidemia   . History of basal cell carcinoma excision BILATERAL ARMS  . Arthritis     BACK  . Nocturia   . Coronary artery disease CARDIOLOGIST- DR Thompson Grayer    STRESS TEST 08-17-10 IN EPIC (LOW RISK ,  NO ISCHEMIA)  . Bifascicular block   . Right bundle branch block   . Hypothyroidism, postsurgical   . GERD (gastroesophageal reflux disease)   . Bladder cancer ([place])   . History of colon polyps   . Sigmoid diverticulosis   . BPH (benign prostatic hyperplasia)   . COPD mixed type ([place])   . Chronic restrictive lung disease   . Complication of anesthesia 12/2010    told he had a "stiff neck" during intubation   . Chronic constipation   . Wears glasses    Past Surgical History  Procedure Laterality Date  . Total thyroidectomy   1991  . Cataract extraction w/ intraocular lens  implant, bilateral    . Cystoscopy with biopsy  12/16/2010    Procedure: CYSTOSCOPY WITH BIOPSY;  Surgeon: Molli Hazard, MD;  Location: [facility];  Service: Urology;  Laterality: N/A;  . Transurethral resection of bladder tumor  12/16/2010    Procedure: TRANSURETHRAL RESECTION OF BLADDER TUMOR (TURBT);  Surgeon: Molli Hazard, MD;  Location: [facility];  Service: Urology;  Laterality: N/A;  . Cystoscopy/retrograde/ureteroscopy  12/16/2010    Procedure: CYSTOSCOPY/RETROGRADE/URETEROSCOPY;  Surgeon: Molli Hazard, MD;  Location: [facility];  Service: Urology;  Laterality: Right;  bilateral retrograde, right ureteroscopy  . Eye surgery      attached lens to iris  . Green light laser turp (transurethral resection of prostate  11-02-2011  . Cardiovascular stress test  08-17-2010   dr allred    Low risk nuclear study with prominent inferobasal thinning vs small prior infarct, no ischemia/  normal LV function and wall motion , ef 63%  . Cystoscopy w/ retrogrades Bilateral 10/08/2014    Procedure: CYSTOSCOPY WITH BILATERAL RETROGRADE PYELOGRAM, BIOPSY, FULGURATION;  Surgeon: Festus Aloe, MD;  Location: [facility];  Service: Urology;  Laterality: Bilateral;   Family History  Problem Relation Age of Onset  . Lung cancer Brother   . Thyroid cancer    .  Diabetes Father    Social History  Substance Use Topics  . Smoking status: Former Smoker -- 2.00 packs/day for 35 years    Types: Cigarettes    Quit date: 01/11/1982  . Smokeless tobacco: Former Systems developer  . Alcohol Use: 0.0 oz/week     Comment: 6-oz vodka per day     Review of Systems  Respiratory: Positive for shortness of breath.   Gastrointestinal: Positive for abdominal pain.  All other systems reviewed and are negative.     Allergies  Review of patient's allergies indicates no known  allergies.  Home Medications   Prior to Admission medications   Medication Sig Start Date End Date Taking? Authorizing Provider  aspirin EC 81 MG tablet Take 81 mg by mouth daily.    Historical Provider, MD  atorvastatin (LIPITOR) 20 MG tablet Take 1 tablet by mouth every morning.     Historical Provider, MD  Linaclotide Rolan Lipa) 145 MCG CAPS capsule Take 145 mcg by mouth every morning.    Historical Provider, MD  losartan (COZAAR) 50 MG tablet Take 1 tablet (50 mg total) by mouth daily. Patient taking differently: Take 50 mg by mouth every morning.  07/03/14   Thompson Grayer, MD  nitroGLYCERIN (NITROSTAT) 0.4 MG SL tablet Place 1 tablet (0.4 mg total) under the tongue every 5 (five) minutes as needed for chest pain. 07/03/14   Thompson Grayer, MD  pantoprazole (PROTONIX) 40 MG tablet Take 1 tablet (40 mg total) by mouth daily. Patient taking differently: Take 40 mg by mouth every morning.  07/03/14   Thompson Grayer, MD  SYNTHROID 150 MCG tablet Take 1 tablet by mouth every morning.  06/11/10   Historical Provider, MD  testosterone enanthate (DELATESTRYL) 200 MG/ML injection Inject 200 mg into the muscle every 28 (twenty-eight) days. For IM use only    Historical Provider, MD  triamterene-hydrochlorothiazide (DYAZIDE) 37.5-25 MG per capsule Take 1 each (1 capsule total) by mouth daily. Patient taking differently: Take 1 capsule by mouth every morning.  06/18/14   Modena Jansky, MD   BP 159/87 mmHg  Pulse 77  Temp(Src) 98 F (36.7 C) (Oral)  Resp 18  Ht 6\' 4"  (1.93 m)  Wt 185 lb (83.915 kg)  BMI 22.53 kg/m2  SpO2 100% Physical Exam  Constitutional: He is oriented to person, place, and time. He appears well-developed and well-nourished.  HENT:  Head: Normocephalic and atraumatic.  Eyes: Conjunctivae and EOM are normal. Pupils are equal, round, and reactive to light.  Neck: Normal range of motion. Neck supple.  Cardiovascular: Normal rate, regular rhythm, normal heart sounds and intact distal  pulses.   Pulmonary/Chest: Effort normal and breath sounds normal.  Abdominal: Soft. Bowel sounds are normal. There is tenderness.  Mild epigastric tenderness  Musculoskeletal: Normal range of motion.  Neurological: He is alert and oriented to person, place, and time.  Skin: Skin is warm and dry.  Psychiatric: He has a normal mood and affect.  Nursing note and vitals reviewed.   ED Course  Procedures (including critical care time) Labs Review Labs Reviewed  LIPASE, BLOOD  COMPREHENSIVE METABOLIC PANEL  CBC  URINALYSIS, ROUTINE W REFLEX MICROSCOPIC (NOT AT [facility])  TROPONIN I  D-DIMER, QUANTITATIVE (NOT AT [facility])  Randolm Idol, ED    Imaging Review Dg Chest 2 View  05/02/2015  CLINICAL DATA:  Shortness of breath and chest pain for 1 day EXAM: CHEST  2 VIEW COMPARISON:  June 16, 2014 FINDINGS: There is mild scarring in the right  base. There is no edema or consolidation. Heart size and pulmonary vascularity are normal. There is atherosclerotic calcification in the aorta. There are postoperative changes in the lower neck region. IMPRESSION: Scarring right base. No edema or consolidation. Stable cardiac silhouette. Electronically Signed   By: Lowella Grip III M.D.   On: 05/02/2015 10:16   I have personally reviewed and evaluated these images and lab results as part of my medical decision-making.   EKG Interpretation   Date/Time:  Friday May 02 2015 09:36:38 EDT Ventricular Rate:  82 PR Interval:  202 QRS Duration: 152 QT Interval:  396 QTC Calculation: 462 R Axis:   99 Text Interpretation:  Normal sinus rhythm Right bundle branch block  Abnormal ECG Confirmed by Alechia Lezama MD, Cherisse Carrell ([id number]) on 05/02/2015 10:17:03  AM      MDM  PT FEELS MUCH BETTER.  HE DOES NOT WANT A CT SCAN.  SINCE HE FEELS BETTER, HE WANTS TO GO HOME.  HE AND HIS WIFE ARE RELIABLE AND HAVE A GOOD PCP.  PT KNOWS TO RETURN IF SX WORSEN. Final diagnoses:  None  EPIGASTRIC ABD PAIN    Isla Pence, MD 05/02/15 [phone]

## 2015-05-02 NOTE — ED Notes (Signed)
Pt was seen here and discharged this am for epigastric pain. Pt states was not given prescription for pain and that the MD told him it was ok to go home and drink a milkshake and that's what he did. He states the pain came back. Epigastric.  And that's why he's back.

## 2015-05-02 NOTE — ED Notes (Signed)
Patient transported to X-ray 

## 2015-05-02 NOTE — ED Notes (Signed)
Pt departed in NAD.  

## 2015-05-02 NOTE — ED Notes (Signed)
Pt states epigastric pain that woke him up from sleep this am.  He took pepcid AC, asa and antacid with some relief.  States has not been feeling well the last few days.  Gave himself enema last night with relief.

## 2015-05-02 NOTE — Discharge Instructions (Signed)
RETURN IF SYMPTOMS WORSEN Abdominal Pain, Adult Many things can cause abdominal pain. Usually, abdominal pain is not caused by a disease and will improve without treatment. It can often be observed and treated at home. Your health care provider will do a physical exam and possibly order blood tests and X-rays to help determine the seriousness of your pain. However, in many cases, more time must pass before a clear cause of the pain can be found. Before that point, your health care provider may not know if you need more testing or further treatment. HOME CARE INSTRUCTIONS Monitor your abdominal pain for any changes. The following actions may help to alleviate any discomfort you are experiencing:  Only take over-the-counter or prescription medicines as directed by your health care provider.  Do not take laxatives unless directed to do so by your health care provider.  Try a clear liquid diet (broth, tea, or water) as directed by your health care provider. Slowly move to a bland diet as tolerated. SEEK MEDICAL CARE IF:  You have unexplained abdominal pain.  You have abdominal pain associated with nausea or diarrhea.  You have pain when you urinate or have a bowel movement.  You experience abdominal pain that wakes you in the night.  You have abdominal pain that is worsened or improved by eating food.  You have abdominal pain that is worsened with eating fatty foods.  You have a fever. SEEK IMMEDIATE MEDICAL CARE IF:  Your pain does not go away within 2 hours.  You keep throwing up (vomiting).  Your pain is felt only in portions of the abdomen, such as the right side or the left lower portion of the abdomen.  You pass bloody or black tarry stools. MAKE SURE YOU:  Understand these instructions.  Will watch your condition.  Will get help right away if you are not doing well or get worse.   This information is not intended to replace advice given to you by your health care  provider. Make sure you discuss any questions you have with your health care provider.   Document Released: 10/07/2004 Document Revised: 09/18/2014 Document Reviewed: 09/06/2012 Elsevier Interactive Patient Education [employer].

## 2015-05-02 NOTE — ED Notes (Signed)
All triage notes written by this RN under chart of EMT Deb

## 2015-05-07 DIAGNOSIS — D090 Carcinoma in situ of bladder: Secondary | ICD-10-CM | POA: Diagnosis not present

## 2015-05-07 DIAGNOSIS — Z0000 Encounter for general adult medical examination without abnormal findings: Secondary | ICD-10-CM | POA: Diagnosis not present

## 2015-05-09 ENCOUNTER — Other Ambulatory Visit: Payer: Self-pay | Admitting: Internal Medicine

## 2015-05-09 DIAGNOSIS — R109 Unspecified abdominal pain: Secondary | ICD-10-CM

## 2015-05-09 DIAGNOSIS — J449 Chronic obstructive pulmonary disease, unspecified: Secondary | ICD-10-CM | POA: Diagnosis not present

## 2015-05-09 DIAGNOSIS — R1084 Generalized abdominal pain: Secondary | ICD-10-CM | POA: Diagnosis not present

## 2015-05-09 DIAGNOSIS — K5909 Other constipation: Secondary | ICD-10-CM | POA: Diagnosis not present

## 2015-05-09 DIAGNOSIS — Z6822 Body mass index (BMI) 22.0-22.9, adult: Secondary | ICD-10-CM | POA: Diagnosis not present

## 2015-05-12 ENCOUNTER — Other Ambulatory Visit: Payer: Self-pay | Admitting: Urology

## 2015-05-12 ENCOUNTER — Encounter (HOSPITAL_BASED_OUTPATIENT_CLINIC_OR_DEPARTMENT_OTHER): Payer: Self-pay | Admitting: *Deleted

## 2015-05-12 NOTE — Progress Notes (Signed)
NPO AFTER MN.  ARRIVE AT 0745.  CURRENT LAB RESULTS, CXR, AND EKG IN CHART AND EPIC.  WILL TAKE AM MEDS W/ SIPS OF WATER DOS WITH EXCEPTION NO TRIAM/ HCT.

## 2015-05-13 DIAGNOSIS — D485 Neoplasm of uncertain behavior of skin: Secondary | ICD-10-CM | POA: Diagnosis not present

## 2015-05-13 DIAGNOSIS — L821 Other seborrheic keratosis: Secondary | ICD-10-CM | POA: Diagnosis not present

## 2015-05-13 DIAGNOSIS — L723 Sebaceous cyst: Secondary | ICD-10-CM | POA: Diagnosis not present

## 2015-05-13 DIAGNOSIS — D0461 Carcinoma in situ of skin of right upper limb, including shoulder: Secondary | ICD-10-CM | POA: Diagnosis not present

## 2015-05-13 DIAGNOSIS — Z85828 Personal history of other malignant neoplasm of skin: Secondary | ICD-10-CM | POA: Diagnosis not present

## 2015-05-13 DIAGNOSIS — L570 Actinic keratosis: Secondary | ICD-10-CM | POA: Diagnosis not present

## 2015-05-13 DIAGNOSIS — E298 Other testicular dysfunction: Secondary | ICD-10-CM | POA: Diagnosis not present

## 2015-05-14 ENCOUNTER — Ambulatory Visit
Admission: RE | Admit: 2015-05-14 | Discharge: 2015-05-14 | Disposition: A | Discharge status: Home / Self Care | Payer: Medicare Other | Source: Ambulatory Visit | Attending: Internal Medicine | Admitting: Internal Medicine

## 2015-05-14 DIAGNOSIS — R109 Unspecified abdominal pain: Secondary | ICD-10-CM | POA: Diagnosis not present

## 2015-05-19 NOTE — H&P (Addendum)
History of Present Illness                 F/u - PCP Dr. Philip Aspen       1- Bladder cancer: Dec 2012 LG Ta and CIS. September 2016 left posterior CIS recurrence. Biopsied and fulgurated.  Last upper tract imaging: September 2016 bilateral retrograde pyelograms negative, May 2015 - CT abdomen and pelvis; 10/2010 - CT hematuria protocol - multiple simple renal cysts. B-RPG 12/2010  Last BCG: Feb 2014 - stopped due to BCG fever (fever 103 after BCG 1/2 dose. Urine & blood cultures negative). Jan 2013 completed 3 doses- had Enterococcus infection and subsequent left epididymitis. Had BCG reaction after #4 resulting in severe LUTS.   Last cytology: August 2016 suspicious-taken for biopsy  Last cystoscopy: September 2016-CIS on biopsy and fulguration.    2-  Renal cysts: Bilateral and do not enhance on CT 10/23/10.     3-  BPH: frequency, weak stream, urgency, nocturia  Urodynamics 06/2011: small capacity, unstable bladder.  10/20113 - Bladder outlet obstruction: GreenLight PVP   Tried: finasteride (flushing & malaise); Oxybutynin ER 5mg  (No SEs)  Tried: rapaflo & avodart - no SEs  Currently: nothing  -Sept 2015 - severe urinary frequency, every 2 hours. He has feelings of incomplete emptying. No weak stream, dysuria or gross hematuria  -Oct 2015 - pvr 32 ml ; frequency q 2 hrs.     4-PCa screening -  -May 2016 normal DRE  -Sep 2016 normal DRE/EUA    5- testicular pain - August 2015 - left testicular pain similar to his pain when he had epididymoorchitis May 2015. He was treated with Cipro and NSAIDs. A scrotal ultrasound was normal. Pain in the left testicle that radiates up into the inguinal region. Not assoc with voiding difficulty, dysuria or gross hematuria. Cipro and NSAIDs did not help the pain. Normal exam.   -sept 2015 - tried Neurontin for his inguinal pain/testicular pain. Neurontin, NSAIDs, antibiotics administered difference.   -Nov 2015 - PT    -May 2016 - cord block x 3 Dr. Rosana Hoes, Dr. Barkley Bruns. Botox trial Dr. Odis Luster.     6- hypogonadism - Aug 2015 - He's been on testosterone injections for 9 months. He tells me he's had clear symptomatic improvement in his energy, sexual function including libido and erections.           Apr 2017 int hx   Patient returns and continued management of bladder CIS. He tried BCG in the past but had fever and bad side effects. He is here today for a cystoscopy and cytology. He's had some issues with reflux and lost some weight recently. No pelvic pain. Urine is been clear without dysuria or gross hematuria.     Past Medical History Problems  1. History of hypercholesterolemia (Z86.39) 2. History of hypertension (Z86.79) 3. History of Thyroid Cancer  Surgical History Problems  1. History of Arthroscopy Knee 2. History of Cystoscopy With Biopsy 3. History of Cystoscopy With Fulguration Medium Lesion (2-5cm) 4. History of Cystoscopy With Ureteroscopy Right 5. History of Laser Vaporization With Transurethral Resection Of Prostate 6. History of Thyroid Surgery Total Thyroidectomy  Current Meds 1. Aspir-81 81 MG Oral Tablet Delayed Release;  Therapy: (Recorded:12Sep2008) to Recorded 2. Atorvastatin Calcium 20 MG Oral Tablet;  Therapy: [id number] to Recorded 3. Ferrex 150 Forte CAPS;  Therapy: (Recorded:02Oct2014) to Recorded 4. Gabapentin 300 MG TABS;  Therapy: (Recorded:09Nov2015) to Recorded 5. Levothyroxine Sodium 150 MCG Oral Tablet;  Therapy: [id number]  to Recorded 6. Pantoprazole Sodium 40 MG Oral Tablet Delayed Release;  Therapy: [id number] to Recorded 7. Testosterone Cypionate 200 MG/ML OIL;  Therapy: (Recorded:23Mar2015) to Recorded 8. Triamterene-HCTZ 37.5-25 MG Oral Capsule;  Therapy: [id number] to Recorded  Allergies Medication  1. Doxycycline Hyclate CAPS  Family History Problems  1. Family history of Family Health Status Number Of Children   2 Sons &  1 Daughter 2. Family history of hypertension (Z82.49) : Father  Social History Problems  1. Activities of daily living (ADL's), independent 2. Alcohol Use (History) 3. Caffeine Use   2 4. Exercise habits   He has been more sedentary with onset of perineal pain.  He was golfing on a regular     basis at least 2 days a week and also mowing his own lawn. 5. Former smoker [phone])   quit 1982 6. Living situation   Is independent at home. 7. Occupation:   Retired  Engineer, site Vital Signs [Data Includes: Last 1 Day]  Recorded: 26Apr2017 10:06AM  Blood Pressure: 108 / 67 Temperature: 98 F Heart Rate: 86  Results/Data Urine [Data Includes: Last 1 Day]   26Apr2017  COLOR AMBER   APPEARANCE CLEAR   SPECIFIC GRAVITY 1.020   pH 5.5   GLUCOSE NEGATIVE   BILIRUBIN NEGATIVE   KETONE NEGATIVE   BLOOD NEGATIVE   PROTEIN NEGATIVE   NITRITE NEGATIVE   LEUKOCYTE ESTERASE NEGATIVE    Procedure  Procedure: Cystoscopy   Informed Consent: Risks, benefits, and potential adverse events were discussed and informed consent was obtained from the patient.  Prep: The patient was prepped with betadine.  Procedure Note:  Urethral meatus:. No abnormalities.  Anterior urethra: No abnormalities.  Prostatic urethra: No abnormalities.  Bladder: Visulization was clear. The ureteral orifices were in the normal anatomic position bilaterally and had clear efflux of urine. A systematic survey of the bladder demonstrated no bladder tumors or stones. The mucosa was smooth without abnormalities. Some mild erythema posteriorly. The patient tolerated the procedure well.  Complications: None.    Assessment Assessed  1. CIS (carcinoma in situ of bladder) (D09.0)  Plan CIS (carcinoma in situ of bladder)  1. Cysto; Status:Complete;   [id number] 2. URINE CYTOLOGY; Status:Hold For - Specimen/Data Collection,Appointment;  Requested for:26Apr2017;  Health Maintenance  3. UA With REFLEX; [Do Not  Release]; Status:Complete;   Done: 26Apr2017 09:34AM  Discussion/Summary           1  Bladder cancer -   pt 1  with   posterior erythema. I sent cytology 1  and   if positive will take for cysto, bbx 1  and   possible RG washes. Otherwise 1  see  1  back in 3   months.   1        1 Amended By: Festus Aloe; May 19 2015 9:07 AM EST  Signatures Electronically signed by : Festus Aloe, M.D.; May 19 2015  9:08AM EST  Add: Cytology was positive. Plan cysto, bbx and fulguration poss renal washes if bladder not suspicious.

## 2015-05-20 ENCOUNTER — Ambulatory Visit (HOSPITAL_BASED_OUTPATIENT_CLINIC_OR_DEPARTMENT_OTHER): Payer: Medicare Other | Admitting: Anesthesiology

## 2015-05-20 ENCOUNTER — Ambulatory Visit (HOSPITAL_BASED_OUTPATIENT_CLINIC_OR_DEPARTMENT_OTHER)
Admission: RE | Admit: 2015-05-20 | Discharge: 2015-05-20 | Disposition: A | Discharge status: Home / Self Care | Payer: Medicare Other | Source: Ambulatory Visit | Attending: Urology | Admitting: Urology

## 2015-05-20 ENCOUNTER — Encounter (HOSPITAL_BASED_OUTPATIENT_CLINIC_OR_DEPARTMENT_OTHER)
Admission: RE | Disposition: A | Discharge status: Home / Self Care | Payer: Self-pay | Source: Ambulatory Visit | Attending: Urology

## 2015-05-20 ENCOUNTER — Encounter (HOSPITAL_BASED_OUTPATIENT_CLINIC_OR_DEPARTMENT_OTHER): Payer: Self-pay | Admitting: Anesthesiology

## 2015-05-20 DIAGNOSIS — J449 Chronic obstructive pulmonary disease, unspecified: Secondary | ICD-10-CM | POA: Insufficient documentation

## 2015-05-20 DIAGNOSIS — E785 Hyperlipidemia, unspecified: Secondary | ICD-10-CM | POA: Insufficient documentation

## 2015-05-20 DIAGNOSIS — R350 Frequency of micturition: Secondary | ICD-10-CM | POA: Insufficient documentation

## 2015-05-20 DIAGNOSIS — E291 Testicular hypofunction: Secondary | ICD-10-CM | POA: Insufficient documentation

## 2015-05-20 DIAGNOSIS — N189 Chronic kidney disease, unspecified: Secondary | ICD-10-CM | POA: Diagnosis not present

## 2015-05-20 DIAGNOSIS — R3912 Poor urinary stream: Secondary | ICD-10-CM | POA: Insufficient documentation

## 2015-05-20 DIAGNOSIS — N3289 Other specified disorders of bladder: Secondary | ICD-10-CM | POA: Diagnosis not present

## 2015-05-20 DIAGNOSIS — Z7982 Long term (current) use of aspirin: Secondary | ICD-10-CM | POA: Diagnosis not present

## 2015-05-20 DIAGNOSIS — R3915 Urgency of urination: Secondary | ICD-10-CM | POA: Insufficient documentation

## 2015-05-20 DIAGNOSIS — I2510 Atherosclerotic heart disease of native coronary artery without angina pectoris: Secondary | ICD-10-CM | POA: Diagnosis not present

## 2015-05-20 DIAGNOSIS — K219 Gastro-esophageal reflux disease without esophagitis: Secondary | ICD-10-CM | POA: Insufficient documentation

## 2015-05-20 DIAGNOSIS — D090 Carcinoma in situ of bladder: Secondary | ICD-10-CM | POA: Diagnosis not present

## 2015-05-20 DIAGNOSIS — Z87891 Personal history of nicotine dependence: Secondary | ICD-10-CM | POA: Insufficient documentation

## 2015-05-20 DIAGNOSIS — N50812 Left testicular pain: Secondary | ICD-10-CM | POA: Insufficient documentation

## 2015-05-20 DIAGNOSIS — R351 Nocturia: Secondary | ICD-10-CM | POA: Diagnosis not present

## 2015-05-20 DIAGNOSIS — Z85850 Personal history of malignant neoplasm of thyroid: Secondary | ICD-10-CM | POA: Diagnosis not present

## 2015-05-20 DIAGNOSIS — Z79899 Other long term (current) drug therapy: Secondary | ICD-10-CM | POA: Insufficient documentation

## 2015-05-20 DIAGNOSIS — E039 Hypothyroidism, unspecified: Secondary | ICD-10-CM | POA: Diagnosis not present

## 2015-05-20 DIAGNOSIS — N401 Enlarged prostate with lower urinary tract symptoms: Secondary | ICD-10-CM | POA: Diagnosis not present

## 2015-05-20 DIAGNOSIS — I10 Essential (primary) hypertension: Secondary | ICD-10-CM | POA: Diagnosis not present

## 2015-05-20 DIAGNOSIS — I129 Hypertensive chronic kidney disease with stage 1 through stage 4 chronic kidney disease, or unspecified chronic kidney disease: Secondary | ICD-10-CM | POA: Diagnosis not present

## 2015-05-20 DIAGNOSIS — C679 Malignant neoplasm of bladder, unspecified: Secondary | ICD-10-CM | POA: Diagnosis not present

## 2015-05-20 HISTORY — PX: CYSTOSCOPY WITH BIOPSY: SHX5122

## 2015-05-20 SURGERY — CYSTOSCOPY, WITH BIOPSY
Anesthesia: General

## 2015-05-20 MED ORDER — ONDANSETRON HCL 4 MG/2ML IJ SOLN
4.0000 mg | Freq: Once | INTRAMUSCULAR | Status: DC | PRN
  Filled 2015-05-20: qty 2

## 2015-05-20 MED ORDER — ONDANSETRON HCL 4 MG/2ML IJ SOLN
INTRAMUSCULAR | Status: DC | PRN
  Administered 2015-05-20: 4 mg via INTRAVENOUS

## 2015-05-20 MED ORDER — CEFAZOLIN SODIUM-DEXTROSE 2-4 GM/100ML-% IV SOLN
INTRAVENOUS | Status: AC
  Filled 2015-05-20: qty 100

## 2015-05-20 MED ORDER — DEXAMETHASONE SODIUM PHOSPHATE 4 MG/ML IJ SOLN
INTRAMUSCULAR | Status: DC | PRN
  Administered 2015-05-20: 5 mg via INTRAVENOUS

## 2015-05-20 MED ORDER — LIDOCAINE HCL (CARDIAC) 20 MG/ML IV SOLN
INTRAVENOUS | Status: AC
  Filled 2015-05-20: qty 5

## 2015-05-20 MED ORDER — PROPOFOL 10 MG/ML IV BOLUS
INTRAVENOUS | Status: AC
  Filled 2015-05-20: qty 20

## 2015-05-20 MED ORDER — PHENAZOPYRIDINE HCL 100 MG PO TABS
100.0000 mg | ORAL_TABLET | Freq: Three times a day (TID) | ORAL | Status: DC
  Administered 2015-05-20: 100 mg via ORAL
  Filled 2015-05-20: qty 1

## 2015-05-20 MED ORDER — PHENAZOPYRIDINE HCL 95 MG PO TABS: 95.0000 mg | ORAL_TABLET | Freq: Three times a day (TID) | ORAL | Status: DC | PRN

## 2015-05-20 MED ORDER — CEPHALEXIN 500 MG PO CAPS
500.0000 mg | ORAL_CAPSULE | Freq: Every day | ORAL | Status: DC
Start: 2015-05-20 — End: 2016-06-11

## 2015-05-20 MED ORDER — LIDOCAINE HCL (CARDIAC) 20 MG/ML IV SOLN
INTRAVENOUS | Status: DC | PRN
  Administered 2015-05-20: 60 mg via INTRAVENOUS

## 2015-05-20 MED ORDER — CEFAZOLIN SODIUM-DEXTROSE 2-4 GM/100ML-% IV SOLN
2.0000 g | INTRAVENOUS | Status: AC
  Administered 2015-05-20: 2 g via INTRAVENOUS
  Filled 2015-05-20: qty 100

## 2015-05-20 MED ORDER — MEPERIDINE HCL 25 MG/ML IJ SOLN
6.2500 mg | INTRAMUSCULAR | Status: DC | PRN
  Filled 2015-05-20: qty 1

## 2015-05-20 MED ORDER — DIATRIZOATE MEGLUMINE 30 % UR SOLN
URETHRAL | Status: DC | PRN
  Administered 2015-05-20: 80 mL via URETHRAL

## 2015-05-20 MED ORDER — CEFAZOLIN SODIUM 1-5 GM-% IV SOLN
1.0000 g | INTRAVENOUS | Status: DC
  Filled 2015-05-20: qty 50

## 2015-05-20 MED ORDER — EPHEDRINE SULFATE 50 MG/ML IJ SOLN
INTRAMUSCULAR | Status: DC | PRN
  Administered 2015-05-20 (×3): 10 mg via INTRAVENOUS

## 2015-05-20 MED ORDER — LACTATED RINGERS IV SOLN
INTRAVENOUS | Status: DC
  Administered 2015-05-20 (×2): via INTRAVENOUS
  Filled 2015-05-20: qty 1000

## 2015-05-20 MED ORDER — FENTANYL CITRATE (PF) 100 MCG/2ML IJ SOLN
INTRAMUSCULAR | Status: AC
  Filled 2015-05-20: qty 2

## 2015-05-20 MED ORDER — FENTANYL CITRATE (PF) 100 MCG/2ML IJ SOLN
25.0000 ug | INTRAMUSCULAR | Status: DC | PRN
  Filled 2015-05-20: qty 1

## 2015-05-20 MED ORDER — PROPOFOL 10 MG/ML IV BOLUS
INTRAVENOUS | Status: DC | PRN
  Administered 2015-05-20: 130 mg via INTRAVENOUS

## 2015-05-20 MED ORDER — PHENAZOPYRIDINE HCL 100 MG PO TABS
ORAL_TABLET | ORAL | Status: AC
  Filled 2015-05-20: qty 1

## 2015-05-20 MED ORDER — FENTANYL CITRATE (PF) 100 MCG/2ML IJ SOLN
INTRAMUSCULAR | Status: DC | PRN
Start: 2015-05-20 — End: 2015-05-20
  Administered 2015-05-20 (×2): 12.5 ug via INTRAVENOUS

## 2015-05-20 SURGICAL SUPPLY — 19 items
BAG DRAIN URO-CYSTO SKYTR STRL (DRAIN) ×2 IMPLANT
BAG URINE DRAINAGE (UROLOGICAL SUPPLIES) ×2 IMPLANT
CANISTER SUCT LVC 12 LTR MEDI- (MISCELLANEOUS) IMPLANT
CATH FOLEY 2WAY SLVR  5CC 16FR (CATHETERS) ×1
CATH FOLEY 2WAY SLVR 5CC 16FR (CATHETERS) ×1 IMPLANT
CATH URET 5FR 28IN OPEN ENDED (CATHETERS) ×4 IMPLANT
CLOTH BEACON ORANGE TIMEOUT ST (SAFETY) ×2 IMPLANT
ELECT REM PT RETURN 9FT ADLT (ELECTROSURGICAL) ×2
ELECTRODE REM PT RTRN 9FT ADLT (ELECTROSURGICAL) ×1 IMPLANT
GLOVE BIO SURGEON STRL SZ7.5 (GLOVE) ×2 IMPLANT
GOWN STRL REUS W/ TWL XL LVL3 (GOWN DISPOSABLE) ×1 IMPLANT
GOWN STRL REUS W/TWL XL LVL3 (GOWN DISPOSABLE) ×1
KIT ROOM TURNOVER WOR (KITS) ×2 IMPLANT
MANIFOLD NEPTUNE II (INSTRUMENTS) IMPLANT
NEEDLE HYPO 22GX1.5 SAFETY (NEEDLE) IMPLANT
NS IRRIG 500ML POUR BTL (IV SOLUTION) IMPLANT
PACK CYSTO (CUSTOM PROCEDURE TRAY) ×2 IMPLANT
TUBE CONNECTING 12X1/4 (SUCTIONS) IMPLANT
WATER STERILE IRR 3000ML UROMA (IV SOLUTION) ×2 IMPLANT

## 2015-05-20 NOTE — Op Note (Signed)
Preoperative diagnosis: CIS of bladder Postoperative diagnosis: Same  Procedure: Cystoscopy with bladder biopsy, fulguration, bilateral retrograde pyelogram and renal washings, cystogram  Surgeon: Junious Silk  Anesthesia: Gen.  Indication for procedure: 82 year old with CIS of bladder found in 2012 and a recurrence posteriorly in 2016. Recent cytology suspicious with some erythematous areas in the bladder. Is brought for biopsy, retrogrades and washing if needed.  Findings: On exam under anesthesia the penis is uncircumcised and the foreskin normal without mass or lesion. The testicles were descended bilaterally and palpably normal. On digital rectal exam the prostate was benign without heart area or nodule.  On cystoscopy there is evidence of prior TURP, patent bladder neck, trigone and ureteral orifices in their normal orthotopic position with clear efflux. There was trabeculation of the bladder. The left posterior biopsy site from last year had some erythema around the edges which may been from healing. These were biopsied and fulgurated. Toward the dome posteriorly there was around erythematous patch of about 10 mm which was biopsied.  Left retrograde pyelogram-outlined a single ureter single collecting system unit without filling defect, stricture or dilation  Right retrograde pyelogram-this outlined a single ureter single collecting system unit without filling defect, stricture or dilation  Cystogram-on scout imaging the colon and rectum contain contrast from patient's procedure last week. This appeared to have been a small bowel follow-through revealing the chart. The bladder had a normal contour was some trabeculation. Postdrainage images there was some contrast in some of the trabeculated areas and the contrast in the colon made it difficult to rule out a small leak but there was no obvious extravasation.  Description of procedure: After consent was obtained patient brought to the  operating room. After adequate anesthesia he is placed in lithotomy position and prepped and draped in the usual sterile fashion. A timeout was performed to confirm the patient and procedure. The cystoscope was passed per urethra and the bladder inspected with 30 and 70 lens. The posterior bladder was biopsied and fulgurated. The area toward the dome posteriorly was difficult to reach required pressure suprapubically and I used the 9 French cold cup biopsies. This area was biopsied and fulgurated. In the very center it appeared thin and I was concerned about a small perforation. However there was equal return of irrigation the entire case. The bladder was irrigated several times. The left ureteral orifice was cannulated and retrograde injection of contrast was performed. The 5 [place] open-ended catheter was advanced to the renal pelvis and saline washed for cytology. The bladder was washed several times. Using a new 5 French catheter the right ureteral orifice was cannulated and retrograde injection of contrast was performed. The catheter was advanced the right renal pelvis and saline was washed for cytology. The catheter was removed. There was good efflux from both sides after the retrogrades and washing. A 60 French Foley was then placed in the bladder filled with 300 mL of Cystografin and fluoroscopic imaging obtained. The bladder was then drained. The catheter was connected to gravity drainage. The patient was awakened and taken to recovery room in stable condition.  Complications: None Blood loss: Minimal  Specimens to pathology: #1 left posterior bladder biopsy #2 dome/posterior biopsy #3 left renal wash for cytology #4 right renal wash for cytology  Drains: 16 French Foley

## 2015-05-20 NOTE — Interval H&P Note (Signed)
History and Physical Interval Note:  05/20/2015 8:27 AM  Jose S Bain Sr.  has presented today for surgery, with the diagnosis of BLADDER CANCER   The various methods of treatment have been discussed with the patient and family. After consideration of risks, benefits and other options for treatment, the patient has consented to  Procedure(s): Roberts  (N/A), discussed may need random bbx and RG/washes if no obvious bladder recurrence as a surgical intervention .  The patient's history has been reviewed, patient examined, no change in status, stable for surgery.  I have reviewed the patient's chart and labs.  Questions were answered to the patient's satisfaction.     Jamar Casagrande

## 2015-05-20 NOTE — Transfer of Care (Signed)
Immediate Anesthesia Transfer of Care Note  Patient: Jose S Lafferty Sr.  Procedure(s) Performed: Procedure(s) (LRB): CYSTOSCOPY WITH BLADDER BIOPSY AND FULGERATION, CYSTOGRAM, BILATERAL RETROGRADES, BILATERAL RENAL WASHINGS (N/A)  Patient Location: PACU  Anesthesia Type: General  Level of Consciousness: awake, oriented, sedated and patient cooperative  Airway & Oxygen Therapy: Patient Spontanous Breathing and Patient connected to face mask oxygen  Post-op Assessment: Report given to PACU RN and Post -op Vital signs reviewed and stable  Post vital signs: Reviewed and stable  Complications: No apparent anesthesia complications  Last Vitals:  Filed Vitals:   05/20/15 0740 05/20/15 0940  BP: 134/65 123/66  Pulse: 93 92  Temp: 36.9 C 36.6 C  Resp: 16 19

## 2015-05-20 NOTE — Anesthesia Preprocedure Evaluation (Addendum)
Anesthesia Evaluation  Patient identified by MRN, date of birth, ID band Patient awake    Reviewed: Allergy & Precautions, NPO status , Patient's Chart, lab work & pertinent test results  History of Anesthesia Complications (+) DIFFICULT AIRWAY and history of anesthetic complications  Airway Mallampati: II  TM Distance: >3 FB Neck ROM: Full    Dental no notable dental hx. (+) Caps   Pulmonary shortness of breath and with exertion, COPD,  COPD inhaler, former smoker,    Pulmonary exam normal breath sounds clear to auscultation       Cardiovascular Exercise Tolerance: Good hypertension, Pt. on medications + CAD  Normal cardiovascular exam+ dysrhythmias  Rhythm:Regular Rate:Normal  12 Lead EKG from 04/2015 RBBB + LPFB   Neuro/Psych negative neurological ROS  negative psych ROS   GI/Hepatic Neg liver ROS, GERD  Medicated and Controlled,  Endo/Other  Hypothyroidism Hyperlipidemia  Renal/GU Renal InsufficiencyRenal diseasenegative Renal ROS   Bladder Ca negative genitourinary   Musculoskeletal  (+) Arthritis , Osteoarthritis,    Abdominal   Peds negative pediatric ROS (+)  Hematology negative hematology ROS (+)   Anesthesia Other Findings   Reproductive/Obstetrics negative OB ROS                           Anesthesia Physical  Anesthesia Plan  ASA: III  Anesthesia Plan: General   Post-op Pain Management:    Induction: Intravenous  Airway Management Planned: LMA  Additional Equipment:   Intra-op Plan:   Post-operative Plan: Extubation in OR  Informed Consent: I have reviewed the patients History and Physical, chart, labs and discussed the procedure including the risks, benefits and alternatives for the proposed anesthesia with the patient or authorized representative who has indicated his/her understanding and acceptance.   Dental advisory given  Plan Discussed with:  CRNA  Anesthesia Plan Comments: (LMA #5 worked 2013)        Anesthesia Quick Evaluation

## 2015-05-20 NOTE — Discharge Instructions (Signed)
Foley Catheter Care, Adult A Foley catheter is a soft, flexible tube. This tube is placed into your bladder to drain pee (urine). If you go home with this catheter in place, follow the instructions below.  REMOVAL OF THE CATHETER: remove the catheter Thursday morning, 05/22/2015 as instructed.  TAKING CARE OF THE CATHETER  Wash your hands with soap and water.  Put soap and water on a clean washcloth.  Clean the skin where the tube goes into your body.  Clean away from the tube site.  Never wipe toward the tube.  Clean the area using a circular motion.  Remove all the soap. Pat the area dry with a clean towel. For males, reposition the skin that covers the end of the penis (foreskin).  Attach the tube to your leg with tape or a leg strap. Do not stretch the tube tight. If you are using tape, remove any stickiness left behind by past tape you used.  Keep the drainage bag below your hips. Keep it off the floor.  Check your tube during the day. Make sure it is working and draining. Make sure the tube does not curl, twist, or bend.  Do not pull on the tube or try to take it out. TAKING CARE OF THE DRAINAGE BAGS You will have a large overnight drainage bag and a small leg bag. You may wear the overnight bag any time. Never wear the small bag at night. Follow the directions below. Emptying the Drainage Bag Empty your drainage bag when it is  - full or at least 2-3 times a day.  Wash your hands with soap and water.  Keep the drainage bag below your hips.  Hold the dirty bag over the toilet or clean container.  Open the pour spout at the bottom of the bag. Empty the pee into the toilet or container. Do not let the pour spout touch anything.  Clean the pour spout with a gauze pad or cotton ball that has rubbing alcohol on it.  Close the pour spout.  Attach the bag to your leg with tape or a leg strap.  Wash your hands well. Changing the Drainage Bag Change your bag once a  month or sooner if it starts to smell or look dirty.   Wash your hands with soap and water.  Pinch the rubber tube so that pee does not spill out.  Disconnect the catheter tube from the drainage tube at the connection valve. Do not let the tubes touch anything.  Clean the end of the catheter tube with an alcohol wipe. Clean the end of a the drainage tube with a different alcohol wipe.  Connect the catheter tube to the drainage tube of the clean drainage bag.  Attach the new bag to the leg with tape or a leg strap. Avoid attaching the new bag too tightly.  Wash your hands well. Cleaning the Drainage Bag 1. Wash your hands with soap and water. 2. Wash the bag in warm, soapy water. 3. Rinse the bag with warm water. 4. Fill the bag with a mixture of white vinegar and water (1 cup vinegar to 1 quart warm water [.2 liter vinegar to 1 liter warm water]). Close the bag and soak it for 30 minutes in the solution. 5. Rinse the bag with warm water. 6. Hang the bag to dry with the pour spout open and hanging downward. 7. Store the clean bag (once it is dry) in a clean plastic bag. 8. Wash your  hands well. PREVENT INFECTION  Wash your hands before and after touching your tube.  Take showers every day. Wash the skin where the tube enters your body. Do not take baths. Replace wet leg straps with dry ones, if this applies.  Do not use powders, sprays, or lotions on the genital area. Only use creams, lotions, or ointments as told by your doctor.  For females, wipe from front to back after going to the bathroom.  Drink enough fluids to keep your pee clear or pale yellow unless you are told not to have too much fluid (fluid restriction).  Do not let the drainage bag or tubing touch or lie on the floor.  Wear cotton underwear to keep the area dry. GET HELP IF:  Your pee is cloudy or smells unusually bad.  Your tube becomes clogged.  You are not draining pee into the bag or your bladder feels  full.  Your tube starts to leak. GET HELP RIGHT AWAY IF:  You have pain, puffiness (swelling), redness, or yellowish-white fluid (pus) where the tube enters the body.  You have pain in the belly (abdomen), legs, lower back, or bladder.  You have a fever.  You see blood fill the tube, or your pee is pink or red.  You feel sick to your stomach (nauseous), throw up (vomit), or have chills.  Your tube gets pulled out. MAKE SURE YOU:   Understand these instructions.  Will watch your condition.  Will get help right away if you are not doing well or get worse.   This information is not intended to replace advice given to you by your health care provider. Make sure you discuss any questions you have with your health care provider.  Cystoscopy, Care After Refer to this sheet in the next few weeks. These instructions provide you with information on caring for yourself after your procedure. Your caregiver may also give you more specific instructions. Your treatment has been planned according to current medical practices, but problems sometimes occur. Call your caregiver if you have any problems or questions after your procedure. HOME CARE INSTRUCTIONS  Things you can do to ease any discomfort after your procedure include:  Drinking enough water and fluids to keep your urine clear or pale yellow.  Taking a warm bath to relieve any burning feelings. SEEK IMMEDIATE MEDICAL CARE IF:   You have an increase in blood in your urine.  You notice blood clots in your urine.  You have difficulty passing urine.  You have the chills.  You have abdominal pain.  You have a fever or persistent symptoms for more than 2-3 days.  You have a fever and your symptoms suddenly get worse. MAKE SURE YOU:   Understand these instructions.  Will watch your condition.  Will get help right away if you are not doing well or get worse.   This information is not intended to replace advice given to you by  your health care provider. Make sure you discuss any questions you have with your health care provider.   Document Released: 07/17/2004 Document Revised: 01/18/2014 Document Reviewed: 06/21/2011 Elsevier Interactive Patient Education 2016 [place] Released: 04/24/2012 Document Revised: 01/18/2014 Document Reviewed: 04/24/2012 Elsevier Interactive Patient Education 2016 [place] CARE INSTRUCTIONS  Activity: Rest for the remainder of the day.  Do not drive or operate equipment today.  You may resume normal activities in one to two days as instructed by your physician.  Meals: Drink plenty of liquids and eat light foods such as gelatin or soup this evening.  You may return to a normal meal plan tomorrow.  Return to Work: You may return to work in one to two days or as instructed by your physician.  Special Instructions / Symptoms: Call your physician if any of these symptoms occur:   -persistent or heavy bleeding  -bleeding which continues after first few urination  -large blood clots that are difficult to pass  -urine stream diminishes or stops completely  -fever equal to or higher than 101 degrees Farenheit.  -cloudy urine with a strong, foul odor  -severe pain  Females should always wipe from front to back after elimination.  You may feel some burning pain when you urinate.  This should disappear with time.  Applying moist heat to the lower abdomen or a hot tub bath may help relieve the pain. \  Follow-Up / Date of Return Visit to Your Physician:  Call for an appointment to arrange follow-up.  Patient Signature:  ________________________________________________________  Nurse's Signature:  ________________________________________________________  Post Anesthesia Home Care Instructions  Activity: Get plenty of rest for the remainder of the day. A responsible adult should stay with you for 24 hours following the procedure.  For the  next 24 hours, DO NOT: -Drive a car -Paediatric nurse -Drink alcoholic beverages -Take any medication unless instructed by your physician -Make any legal decisions or sign important papers.  Meals: Start with liquid foods such as gelatin or soup. Progress to regular foods as tolerated. Avoid greasy, spicy, heavy foods. If nausea and/or vomiting occur, drink only clear liquids until the nausea and/or vomiting subsides. Call your physician if vomiting continues.  Special Instructions/Symptoms: Your throat may feel dry or sore from the anesthesia or the breathing tube placed in your throat during surgery. If this causes discomfort, gargle with warm salt water. The discomfort should disappear within 24 hours.  If you had a scopolamine patch placed behind your ear for the management of post- operative nausea and/or vomiting:  1. The medication in the patch is effective for 72 hours, after which it should be removed.  Wrap patch in a tissue and discard in the trash. Wash hands thoroughly with soap and water. 2. You may remove the patch earlier than 72 hours if you experience unpleasant side effects which may include dry mouth, dizziness or visual disturbances. 3. Avoid touching the patch. Wash your hands with soap and water after contact with the patch.

## 2015-05-20 NOTE — Anesthesia Procedure Notes (Signed)
Procedure Name: LMA Insertion Date/Time: 05/20/2015 8:36 AM Performed by: Justice Rocher Pre-anesthesia Checklist: Patient identified, Emergency Drugs available, Suction available and Patient being monitored Patient Re-evaluated:Patient Re-evaluated prior to inductionOxygen Delivery Method: Circle System Utilized Preoxygenation: Pre-oxygenation with 100% oxygen Intubation Type: IV induction Ventilation: Mask ventilation without difficulty LMA: LMA inserted LMA Size: 5.0 Number of attempts: 1 Airway Equipment and Method: Bite block Placement Confirmation: positive ETCO2 Tube secured with: Tape Dental Injury: Teeth and Oropharynx as per pre-operative assessment

## 2015-05-22 ENCOUNTER — Encounter (HOSPITAL_BASED_OUTPATIENT_CLINIC_OR_DEPARTMENT_OTHER): Payer: Self-pay | Admitting: Urology

## 2015-05-24 NOTE — Anesthesia Postprocedure Evaluation (Signed)
Anesthesia Post Note  Patient: Jose Lawson.  Procedure(s) Performed: Procedure(s) (LRB): CYSTOSCOPY WITH BLADDER BIOPSY AND FULGERATION, CYSTOGRAM, BILATERAL RETROGRADES, BILATERAL RENAL WASHINGS (N/A)  Patient location during evaluation: PACU Anesthesia Type: General Level of consciousness: awake and alert and oriented Pain management: pain level controlled Vital Signs Assessment: post-procedure vital signs reviewed and stable Respiratory status: spontaneous breathing, nonlabored ventilation and respiratory function stable Cardiovascular status: blood pressure returned to baseline and stable Postop Assessment: no signs of nausea or vomiting Anesthetic complications: no    Last Vitals:  Filed Vitals:   05/20/15 1030 05/20/15 1115  BP: 136/71 128/61  Pulse: 81 83  Temp:  36.6 C  Resp: 14 14    Last Pain:  Filed Vitals:   05/21/15 1634  PainSc: 0-No pain                 Areyana Leoni A.

## 2015-06-10 DIAGNOSIS — Z0000 Encounter for general adult medical examination without abnormal findings: Secondary | ICD-10-CM | POA: Diagnosis not present

## 2015-06-10 DIAGNOSIS — R300 Dysuria: Secondary | ICD-10-CM | POA: Diagnosis not present

## 2015-06-10 DIAGNOSIS — N481 Balanitis: Secondary | ICD-10-CM | POA: Diagnosis not present

## 2015-06-13 DIAGNOSIS — Z6823 Body mass index (BMI) 23.0-23.9, adult: Secondary | ICD-10-CM | POA: Diagnosis not present

## 2015-06-13 DIAGNOSIS — R7301 Impaired fasting glucose: Secondary | ICD-10-CM | POA: Diagnosis not present

## 2015-06-13 DIAGNOSIS — K5909 Other constipation: Secondary | ICD-10-CM | POA: Diagnosis not present

## 2015-06-13 DIAGNOSIS — R1084 Generalized abdominal pain: Secondary | ICD-10-CM | POA: Diagnosis not present

## 2015-06-13 DIAGNOSIS — J449 Chronic obstructive pulmonary disease, unspecified: Secondary | ICD-10-CM | POA: Diagnosis not present

## 2015-06-13 DIAGNOSIS — I10 Essential (primary) hypertension: Secondary | ICD-10-CM | POA: Diagnosis not present

## 2015-06-17 DIAGNOSIS — E298 Other testicular dysfunction: Secondary | ICD-10-CM | POA: Diagnosis not present

## 2015-06-25 DIAGNOSIS — N4889 Other specified disorders of penis: Secondary | ICD-10-CM | POA: Diagnosis not present

## 2015-07-09 DIAGNOSIS — C674 Malignant neoplasm of posterior wall of bladder: Secondary | ICD-10-CM | POA: Diagnosis not present

## 2015-07-09 DIAGNOSIS — R300 Dysuria: Secondary | ICD-10-CM | POA: Diagnosis not present

## 2015-07-14 DIAGNOSIS — H1820 Unspecified corneal edema: Secondary | ICD-10-CM | POA: Diagnosis not present

## 2015-07-14 DIAGNOSIS — H5712 Ocular pain, left eye: Secondary | ICD-10-CM | POA: Diagnosis not present

## 2015-07-17 DIAGNOSIS — E298 Other testicular dysfunction: Secondary | ICD-10-CM | POA: Diagnosis not present

## 2015-07-21 DIAGNOSIS — H11003 Unspecified pterygium of eye, bilateral: Secondary | ICD-10-CM | POA: Diagnosis not present

## 2015-07-21 DIAGNOSIS — Z961 Presence of intraocular lens: Secondary | ICD-10-CM | POA: Diagnosis not present

## 2015-07-21 DIAGNOSIS — H1820 Unspecified corneal edema: Secondary | ICD-10-CM | POA: Diagnosis not present

## 2015-07-31 DIAGNOSIS — E538 Deficiency of other specified B group vitamins: Secondary | ICD-10-CM | POA: Diagnosis not present

## 2015-07-31 DIAGNOSIS — Z6824 Body mass index (BMI) 24.0-24.9, adult: Secondary | ICD-10-CM | POA: Diagnosis not present

## 2015-07-31 DIAGNOSIS — N320 Bladder-neck obstruction: Secondary | ICD-10-CM | POA: Diagnosis not present

## 2015-07-31 DIAGNOSIS — E038 Other specified hypothyroidism: Secondary | ICD-10-CM | POA: Diagnosis not present

## 2015-07-31 DIAGNOSIS — R5383 Other fatigue: Secondary | ICD-10-CM | POA: Diagnosis not present

## 2015-07-31 DIAGNOSIS — J449 Chronic obstructive pulmonary disease, unspecified: Secondary | ICD-10-CM | POA: Diagnosis not present

## 2015-08-01 DIAGNOSIS — R5383 Other fatigue: Secondary | ICD-10-CM | POA: Diagnosis not present

## 2015-08-01 DIAGNOSIS — E038 Other specified hypothyroidism: Secondary | ICD-10-CM | POA: Diagnosis not present

## 2015-08-01 DIAGNOSIS — E538 Deficiency of other specified B group vitamins: Secondary | ICD-10-CM | POA: Diagnosis not present

## 2015-08-04 DIAGNOSIS — R300 Dysuria: Secondary | ICD-10-CM | POA: Diagnosis not present

## 2015-08-04 DIAGNOSIS — D090 Carcinoma in situ of bladder: Secondary | ICD-10-CM | POA: Diagnosis not present

## 2015-08-07 DIAGNOSIS — R300 Dysuria: Secondary | ICD-10-CM | POA: Diagnosis not present

## 2015-08-13 DIAGNOSIS — H1820 Unspecified corneal edema: Secondary | ICD-10-CM | POA: Diagnosis not present

## 2015-08-19 DIAGNOSIS — E298 Other testicular dysfunction: Secondary | ICD-10-CM | POA: Diagnosis not present

## 2015-08-26 DIAGNOSIS — C4441 Basal cell carcinoma of skin of scalp and neck: Secondary | ICD-10-CM | POA: Diagnosis not present

## 2015-08-26 DIAGNOSIS — Z85828 Personal history of other malignant neoplasm of skin: Secondary | ICD-10-CM | POA: Diagnosis not present

## 2015-08-26 DIAGNOSIS — L821 Other seborrheic keratosis: Secondary | ICD-10-CM | POA: Diagnosis not present

## 2015-08-26 DIAGNOSIS — L570 Actinic keratosis: Secondary | ICD-10-CM | POA: Diagnosis not present

## 2015-08-26 DIAGNOSIS — D485 Neoplasm of uncertain behavior of skin: Secondary | ICD-10-CM | POA: Diagnosis not present

## 2015-08-26 DIAGNOSIS — C44319 Basal cell carcinoma of skin of other parts of face: Secondary | ICD-10-CM | POA: Diagnosis not present

## 2015-08-26 DIAGNOSIS — D692 Other nonthrombocytopenic purpura: Secondary | ICD-10-CM | POA: Diagnosis not present

## 2015-09-05 DIAGNOSIS — M1711 Unilateral primary osteoarthritis, right knee: Secondary | ICD-10-CM | POA: Diagnosis not present

## 2015-09-05 DIAGNOSIS — M170 Bilateral primary osteoarthritis of knee: Secondary | ICD-10-CM | POA: Diagnosis not present

## 2015-09-05 DIAGNOSIS — M1712 Unilateral primary osteoarthritis, left knee: Secondary | ICD-10-CM | POA: Diagnosis not present

## 2015-09-23 DIAGNOSIS — R7301 Impaired fasting glucose: Secondary | ICD-10-CM | POA: Diagnosis not present

## 2015-09-23 DIAGNOSIS — J449 Chronic obstructive pulmonary disease, unspecified: Secondary | ICD-10-CM | POA: Diagnosis not present

## 2015-09-23 DIAGNOSIS — Z23 Encounter for immunization: Secondary | ICD-10-CM | POA: Diagnosis not present

## 2015-09-23 DIAGNOSIS — Z6823 Body mass index (BMI) 23.0-23.9, adult: Secondary | ICD-10-CM | POA: Diagnosis not present

## 2015-09-23 DIAGNOSIS — E298 Other testicular dysfunction: Secondary | ICD-10-CM | POA: Diagnosis not present

## 2015-09-23 DIAGNOSIS — C679 Malignant neoplasm of bladder, unspecified: Secondary | ICD-10-CM | POA: Diagnosis not present

## 2015-09-23 DIAGNOSIS — R5383 Other fatigue: Secondary | ICD-10-CM | POA: Diagnosis not present

## 2015-10-20 DIAGNOSIS — H1820 Unspecified corneal edema: Secondary | ICD-10-CM | POA: Diagnosis not present

## 2015-10-21 DIAGNOSIS — L570 Actinic keratosis: Secondary | ICD-10-CM | POA: Diagnosis not present

## 2015-10-21 DIAGNOSIS — D485 Neoplasm of uncertain behavior of skin: Secondary | ICD-10-CM | POA: Diagnosis not present

## 2015-10-21 DIAGNOSIS — C44622 Squamous cell carcinoma of skin of right upper limb, including shoulder: Secondary | ICD-10-CM | POA: Diagnosis not present

## 2015-10-21 DIAGNOSIS — Z85828 Personal history of other malignant neoplasm of skin: Secondary | ICD-10-CM | POA: Diagnosis not present

## 2015-10-23 DIAGNOSIS — E298 Other testicular dysfunction: Secondary | ICD-10-CM | POA: Diagnosis not present

## 2015-11-12 DIAGNOSIS — C674 Malignant neoplasm of posterior wall of bladder: Secondary | ICD-10-CM | POA: Diagnosis not present

## 2015-11-12 DIAGNOSIS — N50812 Left testicular pain: Secondary | ICD-10-CM | POA: Diagnosis not present

## 2015-11-13 DIAGNOSIS — N50819 Testicular pain, unspecified: Secondary | ICD-10-CM | POA: Diagnosis not present

## 2015-11-26 DIAGNOSIS — E298 Other testicular dysfunction: Secondary | ICD-10-CM | POA: Diagnosis not present

## 2015-12-02 DIAGNOSIS — L718 Other rosacea: Secondary | ICD-10-CM | POA: Diagnosis not present

## 2015-12-02 DIAGNOSIS — Z85828 Personal history of other malignant neoplasm of skin: Secondary | ICD-10-CM | POA: Diagnosis not present

## 2015-12-02 DIAGNOSIS — L570 Actinic keratosis: Secondary | ICD-10-CM | POA: Diagnosis not present

## 2015-12-02 DIAGNOSIS — L821 Other seborrheic keratosis: Secondary | ICD-10-CM | POA: Diagnosis not present

## 2015-12-12 DIAGNOSIS — M859 Disorder of bone density and structure, unspecified: Secondary | ICD-10-CM | POA: Diagnosis not present

## 2015-12-12 DIAGNOSIS — E038 Other specified hypothyroidism: Secondary | ICD-10-CM | POA: Diagnosis not present

## 2015-12-12 DIAGNOSIS — E538 Deficiency of other specified B group vitamins: Secondary | ICD-10-CM | POA: Diagnosis not present

## 2015-12-12 DIAGNOSIS — Z125 Encounter for screening for malignant neoplasm of prostate: Secondary | ICD-10-CM | POA: Diagnosis not present

## 2015-12-12 DIAGNOSIS — R7301 Impaired fasting glucose: Secondary | ICD-10-CM | POA: Diagnosis not present

## 2015-12-12 DIAGNOSIS — E784 Other hyperlipidemia: Secondary | ICD-10-CM | POA: Diagnosis not present

## 2015-12-12 DIAGNOSIS — I10 Essential (primary) hypertension: Secondary | ICD-10-CM | POA: Diagnosis not present

## 2015-12-19 DIAGNOSIS — E875 Hyperkalemia: Secondary | ICD-10-CM | POA: Diagnosis not present

## 2015-12-19 DIAGNOSIS — I10 Essential (primary) hypertension: Secondary | ICD-10-CM | POA: Diagnosis not present

## 2015-12-19 DIAGNOSIS — Z0000 Encounter for general adult medical examination without abnormal findings: Secondary | ICD-10-CM | POA: Diagnosis not present

## 2015-12-19 DIAGNOSIS — C679 Malignant neoplasm of bladder, unspecified: Secondary | ICD-10-CM | POA: Diagnosis not present

## 2015-12-19 DIAGNOSIS — E298 Other testicular dysfunction: Secondary | ICD-10-CM | POA: Diagnosis not present

## 2015-12-19 DIAGNOSIS — R7301 Impaired fasting glucose: Secondary | ICD-10-CM | POA: Diagnosis not present

## 2015-12-19 DIAGNOSIS — E538 Deficiency of other specified B group vitamins: Secondary | ICD-10-CM | POA: Diagnosis not present

## 2015-12-19 DIAGNOSIS — J449 Chronic obstructive pulmonary disease, unspecified: Secondary | ICD-10-CM | POA: Diagnosis not present

## 2015-12-19 DIAGNOSIS — K5909 Other constipation: Secondary | ICD-10-CM | POA: Diagnosis not present

## 2015-12-19 DIAGNOSIS — R5383 Other fatigue: Secondary | ICD-10-CM | POA: Diagnosis not present

## 2016-01-19 DIAGNOSIS — H11001 Unspecified pterygium of right eye: Secondary | ICD-10-CM | POA: Diagnosis not present

## 2016-01-19 DIAGNOSIS — Z961 Presence of intraocular lens: Secondary | ICD-10-CM | POA: Diagnosis not present

## 2016-01-19 DIAGNOSIS — H1820 Unspecified corneal edema: Secondary | ICD-10-CM | POA: Diagnosis not present

## 2016-01-21 DIAGNOSIS — M1711 Unilateral primary osteoarthritis, right knee: Secondary | ICD-10-CM | POA: Diagnosis not present

## 2016-01-21 DIAGNOSIS — M25531 Pain in right wrist: Secondary | ICD-10-CM | POA: Diagnosis not present

## 2016-01-21 DIAGNOSIS — M1712 Unilateral primary osteoarthritis, left knee: Secondary | ICD-10-CM | POA: Diagnosis not present

## 2016-02-05 DIAGNOSIS — E291 Testicular hypofunction: Secondary | ICD-10-CM | POA: Diagnosis not present

## 2016-02-11 DIAGNOSIS — M1712 Unilateral primary osteoarthritis, left knee: Secondary | ICD-10-CM | POA: Diagnosis not present

## 2016-02-18 DIAGNOSIS — M1712 Unilateral primary osteoarthritis, left knee: Secondary | ICD-10-CM | POA: Diagnosis not present

## 2016-02-23 DIAGNOSIS — M1712 Unilateral primary osteoarthritis, left knee: Secondary | ICD-10-CM | POA: Diagnosis not present

## 2016-02-24 DIAGNOSIS — Z85828 Personal history of other malignant neoplasm of skin: Secondary | ICD-10-CM | POA: Diagnosis not present

## 2016-02-24 DIAGNOSIS — D692 Other nonthrombocytopenic purpura: Secondary | ICD-10-CM | POA: Diagnosis not present

## 2016-02-24 DIAGNOSIS — L570 Actinic keratosis: Secondary | ICD-10-CM | POA: Diagnosis not present

## 2016-02-24 DIAGNOSIS — D3617 Benign neoplasm of peripheral nerves and autonomic nervous system of trunk, unspecified: Secondary | ICD-10-CM | POA: Diagnosis not present

## 2016-03-02 DIAGNOSIS — E298 Other testicular dysfunction: Secondary | ICD-10-CM | POA: Diagnosis not present

## 2016-03-19 DIAGNOSIS — M1711 Unilateral primary osteoarthritis, right knee: Secondary | ICD-10-CM | POA: Diagnosis not present

## 2016-03-19 DIAGNOSIS — M1712 Unilateral primary osteoarthritis, left knee: Secondary | ICD-10-CM | POA: Diagnosis not present

## 2016-03-30 DIAGNOSIS — M1712 Unilateral primary osteoarthritis, left knee: Secondary | ICD-10-CM | POA: Diagnosis not present

## 2016-03-30 DIAGNOSIS — M170 Bilateral primary osteoarthritis of knee: Secondary | ICD-10-CM | POA: Diagnosis not present

## 2016-03-30 DIAGNOSIS — M1711 Unilateral primary osteoarthritis, right knee: Secondary | ICD-10-CM | POA: Diagnosis not present

## 2016-03-30 DIAGNOSIS — E298 Other testicular dysfunction: Secondary | ICD-10-CM | POA: Diagnosis not present

## 2016-04-05 DIAGNOSIS — Z23 Encounter for immunization: Secondary | ICD-10-CM | POA: Diagnosis not present

## 2016-04-05 DIAGNOSIS — R296 Repeated falls: Secondary | ICD-10-CM | POA: Diagnosis not present

## 2016-04-12 DIAGNOSIS — I10 Essential (primary) hypertension: Secondary | ICD-10-CM | POA: Diagnosis not present

## 2016-04-12 DIAGNOSIS — Z6824 Body mass index (BMI) 24.0-24.9, adult: Secondary | ICD-10-CM | POA: Diagnosis not present

## 2016-04-12 DIAGNOSIS — R296 Repeated falls: Secondary | ICD-10-CM | POA: Diagnosis not present

## 2016-05-04 DIAGNOSIS — E298 Other testicular dysfunction: Secondary | ICD-10-CM | POA: Diagnosis not present

## 2016-05-17 DIAGNOSIS — N509 Disorder of male genital organs, unspecified: Secondary | ICD-10-CM | POA: Diagnosis not present

## 2016-05-17 DIAGNOSIS — C674 Malignant neoplasm of posterior wall of bladder: Secondary | ICD-10-CM | POA: Diagnosis not present

## 2016-05-23 ENCOUNTER — Other Ambulatory Visit: Payer: Self-pay | Admitting: Urology

## 2016-05-24 DIAGNOSIS — H1820 Unspecified corneal edema: Secondary | ICD-10-CM | POA: Diagnosis not present

## 2016-05-24 DIAGNOSIS — H353132 Nonexudative age-related macular degeneration, bilateral, intermediate dry stage: Secondary | ICD-10-CM | POA: Diagnosis not present

## 2016-05-24 DIAGNOSIS — Z961 Presence of intraocular lens: Secondary | ICD-10-CM | POA: Diagnosis not present

## 2016-05-26 DIAGNOSIS — N509 Disorder of male genital organs, unspecified: Secondary | ICD-10-CM | POA: Diagnosis not present

## 2016-05-28 DIAGNOSIS — N509 Disorder of male genital organs, unspecified: Secondary | ICD-10-CM | POA: Diagnosis not present

## 2016-06-02 ENCOUNTER — Other Ambulatory Visit: Payer: Self-pay | Admitting: Urology

## 2016-06-02 DIAGNOSIS — C44622 Squamous cell carcinoma of skin of right upper limb, including shoulder: Secondary | ICD-10-CM | POA: Diagnosis not present

## 2016-06-02 DIAGNOSIS — Z85828 Personal history of other malignant neoplasm of skin: Secondary | ICD-10-CM | POA: Diagnosis not present

## 2016-06-02 DIAGNOSIS — L821 Other seborrheic keratosis: Secondary | ICD-10-CM | POA: Diagnosis not present

## 2016-06-02 DIAGNOSIS — E298 Other testicular dysfunction: Secondary | ICD-10-CM | POA: Diagnosis not present

## 2016-06-02 DIAGNOSIS — D485 Neoplasm of uncertain behavior of skin: Secondary | ICD-10-CM | POA: Diagnosis not present

## 2016-06-02 DIAGNOSIS — L570 Actinic keratosis: Secondary | ICD-10-CM | POA: Diagnosis not present

## 2016-06-03 NOTE — Progress Notes (Signed)
requested surgery orders with pam gibson

## 2016-06-11 NOTE — Patient Instructions (Addendum)
Jose Lawson.  06/11/2016   Your procedure is scheduled on: 06-18-16  Report to [facility] Main  Entrance Take [place] Elevators to 3rd floor to  [place] at 05:30 AM.    Call this number if you have problems the morning of surgery [phone]   Remember: ONLY 1 PERSON MAY GO WITH YOU TO SHORT STAY TO GET  READY MORNING OF [name].  Do not eat food or drink liquids :After Midnight.     Take these medicines the morning of surgery with A SIP OF WATER: Atorvastatin (Lipitor), Levothyroxine (Synthroid) and  Pantoprazole (Prilosec).                                You may not have any metal on your body including hair pins and              piercings  Do not wear jewelry, make-up, lotions, powders or perfumes, deodorant             Men may shave face and neck.   Do not bring valuables to the hospital. [name].  Contacts, dentures or bridgework may not be worn into surgery.      Patients discharged the day of surgery will not be allowed to drive home.  Name and phone number of your driver: Jose Lawson [phone]                Please read over the following fact sheets you were given: _____________________________________________________________________             [facility] - Preparing for Surgery Before surgery, you can play an important role.  Because skin is not sterile, your skin needs to be as free of germs as possible.  You can reduce the number of germs on your skin by washing with CHG (chlorahexidine gluconate) soap before surgery.  CHG is an antiseptic cleaner which kills germs and bonds with the skin to continue killing germs even after washing. Please DO NOT use if you have an allergy to CHG or antibacterial soaps.  If your skin becomes reddened/irritated stop using the CHG and inform your nurse when you arrive at Short Stay. Do not shave (including legs and underarms) for at least 48  hours prior to the first CHG shower.  You may shave your face/neck. Please follow these instructions carefully:  1.  Shower with CHG Soap the night before surgery and the  morning of Surgery.  2.  If you choose to wash your hair, wash your hair first as usual with your  normal  shampoo.  3.  After you shampoo, rinse your hair and body thoroughly to remove the  shampoo.                           4.  Use CHG as you would any other liquid soap.  You can apply chg directly  to the skin and wash                       Gently with a scrungie or clean washcloth.  5.  Apply the CHG Soap to your body ONLY FROM THE NECK DOWN.  Do not use on face/ open                           Wound or open sores. Avoid contact with eyes, ears mouth and genitals (private parts).                       Wash face,  Genitals (private parts) with your normal soap.             6.  Wash thoroughly, paying special attention to the area where your surgery  will be performed.  7.  Thoroughly rinse your body with warm water from the neck down.  8.  DO NOT shower/wash with your normal soap after using and rinsing off  the CHG Soap.                9.  Pat yourself dry with a clean towel.            10.  Wear clean pajamas.            11.  Place clean sheets on your bed the night of your first shower and do not  sleep with pets. Day of Surgery : Do not apply any lotions/deodorants the morning of surgery.  Please wear clean clothes to the hospital/surgery center.  FAILURE TO FOLLOW THESE INSTRUCTIONS MAY RESULT IN THE CANCELLATION OF YOUR SURGERY PATIENT SIGNATURE_________________________________  NURSE SIGNATURE__________________________________  ________________________________________________________________________

## 2016-06-15 ENCOUNTER — Encounter (HOSPITAL_COMMUNITY): Payer: Self-pay

## 2016-06-15 ENCOUNTER — Encounter (HOSPITAL_COMMUNITY)
Admission: RE | Admit: 2016-06-15 | Discharge: 2016-06-15 | Disposition: A | Discharge status: Home / Self Care | Payer: Medicare Other | Source: Ambulatory Visit | Attending: Urology | Admitting: Urology

## 2016-06-15 DIAGNOSIS — J449 Chronic obstructive pulmonary disease, unspecified: Secondary | ICD-10-CM | POA: Diagnosis not present

## 2016-06-15 DIAGNOSIS — I2510 Atherosclerotic heart disease of native coronary artery without angina pectoris: Secondary | ICD-10-CM | POA: Diagnosis not present

## 2016-06-15 DIAGNOSIS — Z87891 Personal history of nicotine dependence: Secondary | ICD-10-CM | POA: Diagnosis not present

## 2016-06-15 DIAGNOSIS — N400 Enlarged prostate without lower urinary tract symptoms: Secondary | ICD-10-CM | POA: Diagnosis not present

## 2016-06-15 DIAGNOSIS — Z85850 Personal history of malignant neoplasm of thyroid: Secondary | ICD-10-CM | POA: Diagnosis not present

## 2016-06-15 DIAGNOSIS — Z8551 Personal history of malignant neoplasm of bladder: Secondary | ICD-10-CM | POA: Diagnosis not present

## 2016-06-15 DIAGNOSIS — D090 Carcinoma in situ of bladder: Secondary | ICD-10-CM | POA: Diagnosis not present

## 2016-06-15 DIAGNOSIS — Z7982 Long term (current) use of aspirin: Secondary | ICD-10-CM | POA: Diagnosis not present

## 2016-06-15 DIAGNOSIS — Z79899 Other long term (current) drug therapy: Secondary | ICD-10-CM | POA: Diagnosis not present

## 2016-06-15 DIAGNOSIS — K5909 Other constipation: Secondary | ICD-10-CM | POA: Diagnosis not present

## 2016-06-15 DIAGNOSIS — K219 Gastro-esophageal reflux disease without esophagitis: Secondary | ICD-10-CM | POA: Diagnosis not present

## 2016-06-15 DIAGNOSIS — E039 Hypothyroidism, unspecified: Secondary | ICD-10-CM | POA: Diagnosis not present

## 2016-06-15 DIAGNOSIS — Z85828 Personal history of other malignant neoplasm of skin: Secondary | ICD-10-CM | POA: Diagnosis not present

## 2016-06-15 DIAGNOSIS — I10 Essential (primary) hypertension: Secondary | ICD-10-CM | POA: Diagnosis not present

## 2016-06-15 DIAGNOSIS — Z808 Family history of malignant neoplasm of other organs or systems: Secondary | ICD-10-CM | POA: Diagnosis not present

## 2016-06-15 DIAGNOSIS — E785 Hyperlipidemia, unspecified: Secondary | ICD-10-CM | POA: Diagnosis not present

## 2016-06-15 LAB — CBC
HEMATOCRIT: 39.7 % (ref 39.0–52.0)
Hemoglobin: 12 g/dL — ABNORMAL LOW (ref 13.0–17.0)
MCH: 25.2 pg — AB (ref 26.0–34.0)
MCHC: 30.2 g/dL (ref 30.0–36.0)
MCV: 83.4 fL (ref 78.0–100.0)
Platelets: 446 10*3/uL — ABNORMAL HIGH (ref 150–400)
RBC: 4.76 MIL/uL (ref 4.22–5.81)
RDW: 17.5 % — ABNORMAL HIGH (ref 11.5–15.5)
WBC: 8.4 10*3/uL (ref 4.0–10.5)

## 2016-06-15 LAB — BASIC METABOLIC PANEL
Anion gap: 9 (ref 5–15)
BUN: 27 mg/dL — ABNORMAL HIGH (ref 6–20)
CO2: 29 mmol/L (ref 22–32)
CREATININE: 1.27 mg/dL — AB (ref 0.61–1.24)
Calcium: 9.6 mg/dL (ref 8.9–10.3)
Chloride: 103 mmol/L (ref 101–111)
GFR calc Af Amer: 56 mL/min — ABNORMAL LOW (ref >60)
GFR calc non Af Amer: 49 mL/min — ABNORMAL LOW (ref >60)
GLUCOSE: 107 mg/dL — AB (ref 65–99)
Potassium: 5 mmol/L (ref 3.5–5.1)
Sodium: 141 mmol/L (ref 135–145)

## 2016-06-15 NOTE — Progress Notes (Signed)
06-15-16 BMP results, routed to Dr. Junious Silk for review.

## 2016-06-17 NOTE — Anesthesia Preprocedure Evaluation (Addendum)
Anesthesia Evaluation  Patient identified by MRN, date of birth, ID band Patient awake    Reviewed: Allergy & Precautions, NPO status , Patient's Chart, lab work & pertinent test results  Airway Mallampati: I       Dental no notable dental hx.    Pulmonary former smoker,    Pulmonary exam normal breath sounds clear to auscultation       Cardiovascular hypertension, Normal cardiovascular exam Rhythm:Regular Rate:Normal     Neuro/Psych    GI/Hepatic   Endo/Other    Renal/GU      Musculoskeletal   Abdominal Normal abdominal exam  (+)   Peds  Hematology negative hematology ROS (+)   Anesthesia Other Findings   Reproductive/Obstetrics                            Anesthesia Physical Anesthesia Plan  ASA: III  Anesthesia Plan: General   Post-op Pain Management:    Induction: Intravenous  PONV Risk Score and Plan: 3 and Ondansetron, Dexamethasone, Propofol, Midazolam and Treatment may vary due to age  Airway Management Planned: LMA  Additional Equipment:   Intra-op Plan:   Post-operative Plan:   Informed Consent: I have reviewed the patients History and Physical, chart, labs and discussed the procedure including the risks, benefits and alternatives for the proposed anesthesia with the patient or authorized representative who has indicated his/her understanding and acceptance.   Dental advisory given  Plan Discussed with: CRNA and Surgeon  Anesthesia Plan Comments:       Anesthesia Quick Evaluation

## 2016-06-18 ENCOUNTER — Ambulatory Visit (HOSPITAL_COMMUNITY): Payer: Medicare Other | Admitting: Anesthesiology

## 2016-06-18 ENCOUNTER — Encounter (HOSPITAL_COMMUNITY): Payer: Self-pay

## 2016-06-18 ENCOUNTER — Encounter (HOSPITAL_COMMUNITY)
Admission: RE | Disposition: A | Discharge status: Home / Self Care | Payer: Self-pay | Source: Ambulatory Visit | Attending: Urology

## 2016-06-18 ENCOUNTER — Ambulatory Visit (HOSPITAL_COMMUNITY)
Admission: RE | Admit: 2016-06-18 | Discharge: 2016-06-18 | Disposition: A | Discharge status: Home / Self Care | Payer: Medicare Other | Source: Ambulatory Visit | Attending: Urology | Admitting: Urology

## 2016-06-18 ENCOUNTER — Ambulatory Visit (HOSPITAL_COMMUNITY): Payer: Medicare Other

## 2016-06-18 DIAGNOSIS — Z808 Family history of malignant neoplasm of other organs or systems: Secondary | ICD-10-CM | POA: Insufficient documentation

## 2016-06-18 DIAGNOSIS — Z87891 Personal history of nicotine dependence: Secondary | ICD-10-CM | POA: Diagnosis not present

## 2016-06-18 DIAGNOSIS — N400 Enlarged prostate without lower urinary tract symptoms: Secondary | ICD-10-CM | POA: Diagnosis not present

## 2016-06-18 DIAGNOSIS — I10 Essential (primary) hypertension: Secondary | ICD-10-CM | POA: Insufficient documentation

## 2016-06-18 DIAGNOSIS — K219 Gastro-esophageal reflux disease without esophagitis: Secondary | ICD-10-CM | POA: Diagnosis not present

## 2016-06-18 DIAGNOSIS — Z8551 Personal history of malignant neoplasm of bladder: Secondary | ICD-10-CM | POA: Insufficient documentation

## 2016-06-18 DIAGNOSIS — E039 Hypothyroidism, unspecified: Secondary | ICD-10-CM | POA: Diagnosis not present

## 2016-06-18 DIAGNOSIS — K5909 Other constipation: Secondary | ICD-10-CM | POA: Diagnosis not present

## 2016-06-18 DIAGNOSIS — Z7982 Long term (current) use of aspirin: Secondary | ICD-10-CM | POA: Insufficient documentation

## 2016-06-18 DIAGNOSIS — J449 Chronic obstructive pulmonary disease, unspecified: Secondary | ICD-10-CM | POA: Diagnosis not present

## 2016-06-18 DIAGNOSIS — I2510 Atherosclerotic heart disease of native coronary artery without angina pectoris: Secondary | ICD-10-CM | POA: Insufficient documentation

## 2016-06-18 DIAGNOSIS — Z85828 Personal history of other malignant neoplasm of skin: Secondary | ICD-10-CM | POA: Insufficient documentation

## 2016-06-18 DIAGNOSIS — E785 Hyperlipidemia, unspecified: Secondary | ICD-10-CM | POA: Diagnosis not present

## 2016-06-18 DIAGNOSIS — D090 Carcinoma in situ of bladder: Secondary | ICD-10-CM | POA: Insufficient documentation

## 2016-06-18 DIAGNOSIS — Z79899 Other long term (current) drug therapy: Secondary | ICD-10-CM | POA: Insufficient documentation

## 2016-06-18 DIAGNOSIS — R896 Abnormal cytological findings in specimens from other organs, systems and tissues: Secondary | ICD-10-CM | POA: Diagnosis not present

## 2016-06-18 DIAGNOSIS — Z85850 Personal history of malignant neoplasm of thyroid: Secondary | ICD-10-CM | POA: Diagnosis not present

## 2016-06-18 DIAGNOSIS — D494 Neoplasm of unspecified behavior of bladder: Secondary | ICD-10-CM | POA: Diagnosis not present

## 2016-06-18 HISTORY — PX: CYSTOSCOPY WITH BIOPSY: SHX5122

## 2016-06-18 HISTORY — PX: CYSTOSCOPY W/ RETROGRADES: SHX1426

## 2016-06-18 SURGERY — CYSTOSCOPY, WITH BIOPSY
Anesthesia: General

## 2016-06-18 MED ORDER — DEXAMETHASONE SODIUM PHOSPHATE 10 MG/ML IJ SOLN
INTRAMUSCULAR | Status: AC
  Filled 2016-06-18: qty 1

## 2016-06-18 MED ORDER — SODIUM CHLORIDE 0.9 % IR SOLN
Status: DC | PRN
  Administered 2016-06-18: 1000 mL

## 2016-06-18 MED ORDER — ONDANSETRON HCL 4 MG/2ML IJ SOLN: 4.0000 mg | Freq: Once | INTRAMUSCULAR | Status: DC | PRN

## 2016-06-18 MED ORDER — ACETAMINOPHEN 325 MG PO TABS: 325.0000 mg | ORAL_TABLET | ORAL | Status: DC | PRN

## 2016-06-18 MED ORDER — CEPHALEXIN 500 MG PO CAPS: 500.0000 mg | ORAL_CAPSULE | Freq: Every day | ORAL | 0 refills | Status: AC

## 2016-06-18 MED ORDER — CEFAZOLIN SODIUM-DEXTROSE 2-4 GM/100ML-% IV SOLN
2.0000 g | Freq: Once | INTRAVENOUS | Status: AC
  Administered 2016-06-18: 2 g via INTRAVENOUS
  Filled 2016-06-18: qty 100

## 2016-06-18 MED ORDER — IOHEXOL 300 MG/ML  SOLN
INTRAMUSCULAR | Status: DC | PRN
  Administered 2016-06-18: 24 mL

## 2016-06-18 MED ORDER — STERILE WATER FOR IRRIGATION IR SOLN
Status: DC | PRN
  Administered 2016-06-18: 3000 mL

## 2016-06-18 MED ORDER — FENTANYL CITRATE (PF) 100 MCG/2ML IJ SOLN: 25.0000 ug | INTRAMUSCULAR | Status: DC | PRN

## 2016-06-18 MED ORDER — CEFAZOLIN SODIUM-DEXTROSE 2-4 GM/100ML-% IV SOLN
INTRAVENOUS | Status: AC
  Filled 2016-06-18: qty 100

## 2016-06-18 MED ORDER — PROPOFOL 10 MG/ML IV BOLUS
INTRAVENOUS | Status: DC | PRN
  Administered 2016-06-18: 30 mg via INTRAVENOUS
  Administered 2016-06-18: 120 mg via INTRAVENOUS

## 2016-06-18 MED ORDER — OXYCODONE HCL 5 MG PO TABS
5.0000 mg | ORAL_TABLET | Freq: Once | ORAL | Status: DC | PRN
Start: 2016-06-18 — End: 2016-06-18

## 2016-06-18 MED ORDER — SUCCINYLCHOLINE CHLORIDE 200 MG/10ML IV SOSY
PREFILLED_SYRINGE | INTRAVENOUS | Status: AC
  Filled 2016-06-18: qty 10

## 2016-06-18 MED ORDER — MEPERIDINE HCL 50 MG/ML IJ SOLN: 6.2500 mg | INTRAMUSCULAR | Status: DC | PRN

## 2016-06-18 MED ORDER — PHENYLEPHRINE HCL 10 MG/ML IJ SOLN
INTRAMUSCULAR | Status: DC | PRN
Start: 2016-06-18 — End: 2016-06-18
  Administered 2016-06-18: 40 ug via INTRAVENOUS

## 2016-06-18 MED ORDER — LIDOCAINE 2% (20 MG/ML) 5 ML SYRINGE
INTRAMUSCULAR | Status: AC
  Filled 2016-06-18: qty 5

## 2016-06-18 MED ORDER — FENTANYL CITRATE (PF) 100 MCG/2ML IJ SOLN
INTRAMUSCULAR | Status: AC
  Filled 2016-06-18: qty 2

## 2016-06-18 MED ORDER — PROPOFOL 10 MG/ML IV BOLUS
INTRAVENOUS | Status: AC
  Filled 2016-06-18: qty 20

## 2016-06-18 MED ORDER — EPHEDRINE SULFATE 50 MG/ML IJ SOLN
INTRAMUSCULAR | Status: DC | PRN
  Administered 2016-06-18 (×2): 2.5 mg via INTRAVENOUS
  Administered 2016-06-18 (×2): 5 mg via INTRAVENOUS
  Administered 2016-06-18 (×2): 2.5 mg via INTRAVENOUS
  Administered 2016-06-18: 5 mg via INTRAVENOUS

## 2016-06-18 MED ORDER — FENTANYL CITRATE (PF) 100 MCG/2ML IJ SOLN
INTRAMUSCULAR | Status: DC | PRN
  Administered 2016-06-18 (×2): 50 ug via INTRAVENOUS

## 2016-06-18 MED ORDER — ONDANSETRON HCL 4 MG/2ML IJ SOLN
INTRAMUSCULAR | Status: AC
  Filled 2016-06-18: qty 2

## 2016-06-18 MED ORDER — LIDOCAINE HCL (CARDIAC) 20 MG/ML IV SOLN
INTRAVENOUS | Status: DC | PRN
  Administered 2016-06-18 (×2): 60 mg via INTRAVENOUS

## 2016-06-18 MED ORDER — LACTATED RINGERS IV SOLN
INTRAVENOUS | Status: DC | PRN
  Administered 2016-06-18: 07:00:00 via INTRAVENOUS

## 2016-06-18 MED ORDER — ONDANSETRON HCL 4 MG/2ML IJ SOLN
INTRAMUSCULAR | Status: DC | PRN
  Administered 2016-06-18: 2 mg via INTRAVENOUS
  Administered 2016-06-18: 4 mg via INTRAVENOUS
  Administered 2016-06-18: 2 mg via INTRAVENOUS

## 2016-06-18 MED ORDER — ACETAMINOPHEN 160 MG/5ML PO SOLN: 325.0000 mg | ORAL | Status: DC | PRN

## 2016-06-18 MED ORDER — DEXAMETHASONE SODIUM PHOSPHATE 10 MG/ML IJ SOLN
INTRAMUSCULAR | Status: DC | PRN
  Administered 2016-06-18: 8 mg via INTRAVENOUS

## 2016-06-18 MED ORDER — SODIUM CHLORIDE 0.9 % IV SOLN
INTRAVENOUS | Status: DC | PRN
  Administered 2016-06-18 (×2): via INTRAVENOUS

## 2016-06-18 MED ORDER — OXYCODONE HCL 5 MG/5ML PO SOLN: 5.0000 mg | Freq: Once | ORAL | Status: DC | PRN

## 2016-06-18 SURGICAL SUPPLY — 22 items
BAG URINE DRAINAGE (UROLOGICAL SUPPLIES) ×3 IMPLANT
BAG URO CATCHER STRL LF (MISCELLANEOUS) ×3 IMPLANT
BASKET ZERO TIP NITINOL 2.4FR (BASKET) IMPLANT
CATH FOLEY 2WAY SLVR  5CC 16FR (CATHETERS) ×1
CATH FOLEY 2WAY SLVR 5CC 16FR (CATHETERS) ×2 IMPLANT
CATH INTERMIT  6FR 70CM (CATHETERS) ×3 IMPLANT
CATH URET 5FR 28IN CONE TIP (BALLOONS)
CATH URET 5FR 70CM CONE TIP (BALLOONS) IMPLANT
CATH URET WHISTLE 6FR (CATHETERS) IMPLANT
CLOTH BEACON ORANGE TIMEOUT ST (SAFETY) ×3 IMPLANT
COVER SURGICAL LIGHT HANDLE (MISCELLANEOUS) ×3 IMPLANT
GLOVE BIO SURGEON STRL SZ7.5 (GLOVE) ×3 IMPLANT
GLOVE BIOGEL M STRL SZ7.5 (GLOVE) ×3 IMPLANT
GOWN STRL REUS W/TWL XL LVL3 (GOWN DISPOSABLE) ×3 IMPLANT
GUIDEWIRE STR DUAL SENSOR (WIRE) ×6 IMPLANT
LOOP CUT BIPOLAR 24F LRG (ELECTROSURGICAL) IMPLANT
MANIFOLD NEPTUNE II (INSTRUMENTS) ×3 IMPLANT
PACK CYSTO (CUSTOM PROCEDURE TRAY) ×3 IMPLANT
SHEATH ACCESS URETERAL 24CM (SHEATH) IMPLANT
SHEATH ACCESS URETERAL 38CM (SHEATH) IMPLANT
TUBING CONNECTING 10 (TUBING) ×3 IMPLANT
WIRE COONS/BENSON .038X145CM (WIRE) IMPLANT

## 2016-06-18 NOTE — Transfer of Care (Signed)
Immediate Anesthesia Transfer of Care Note  Patient: Jose Burly Walsworth Sr.  Procedure(s) Performed: Procedure(s): CYSTOSCOPY WITH BIOPSY FULGURATION (N/A) CYSTOSCOPY WITH RETROGRADE PYELOGRAM (Bilateral)  Patient Location: PACU  Anesthesia Type:General  Level of Consciousness: awake, alert , oriented and patient cooperative  Airway & Oxygen Therapy: Patient Spontanous Breathing and Patient connected to face mask oxygen  Post-op Assessment: Report given to RN, Post -op Vital signs reviewed and stable and Patient moving all extremities  Post vital signs: Reviewed and stable  Last Vitals:  Vitals:   06/18/16 0527  BP: (!) 124/54  Pulse: 81  Resp: 18  Temp: 36.7 C    Last Pain:  Vitals:   06/18/16 0527  TempSrc: Oral         Complications: No apparent anesthesia complications

## 2016-06-18 NOTE — Discharge Instructions (Signed)
General Anesthesia, Adult, Care After These instructions provide you with information about caring for yourself after your procedure. Your health care provider may also give you more specific instructions. Your treatment has been planned according to current medical practices, but problems sometimes occur. Call your health care provider if you have any problems or questions after your procedure. What can I expect after the procedure? After the procedure, it is common to have:  Vomiting.  A sore throat.  Mental slowness.  It is common to feel:  Nauseous.  Cold or shivery.  Sleepy.  Tired.  Sore or achy, even in parts of your body where you did not have surgery.  Follow these instructions at home: For at least 24 hours after the procedure:  Do not: ? Participate in activities where you could fall or become injured. ? Drive. ? Use heavy machinery. ? Drink alcohol. ? Take sleeping pills or medicines that cause drowsiness. ? Make important decisions or sign legal documents. ? Take care of children on your own.  Rest. Eating and drinking  If you vomit, drink water, juice, or soup when you can drink without vomiting.  Drink enough fluid to keep your urine clear or pale yellow.  Make sure you have little or no nausea before eating solid foods.  Follow the diet recommended by your health care provider. General instructions  Have a responsible adult stay with you until you are awake and alert.  Return to your normal activities as told by your health care provider. Ask your health care provider what activities are safe for you.  Take over-the-counter and prescription medicines only as told by your health care provider.  If you smoke, do not smoke without supervision.  Keep all follow-up visits as told by your health care provider. This is important. Contact a health care provider if:  You continue to have nausea or vomiting at home, and medicines are not helpful.  You  cannot drink fluids or start eating again.  You cannot urinate after 8-12 hours.  You develop a skin rash.  You have fever.  You have increasing redness at the site of your procedure. Get help right away if:  You have difficulty breathing.  You have chest pain.  You have unexpected bleeding.  You feel that you are having a life-threatening or urgent problem. This information is not intended to replace advice given to you by your health care provider. Make sure you discuss any questions you have with your health care provider. Document Released: 04/05/2000 Document Revised: 06/02/2015 Document Reviewed: 12/12/2014 Elsevier Interactive Patient Education  2018 [employer]. Indwelling Urinary Catheter Care, Adult Take good care of your catheter to keep it working and to prevent problems.  REMOVAL OF THE CATHETER: remove the catheter Monday morning, Jun 21, 2016. Can remove Sat or Sun if bothersome.   How to wear your catheter Attach your catheter to your leg with tape (adhesive tape) or a leg strap. Make sure it is not too tight. If you use tape, remove any bits of tape that are already on the catheter. How to wear a drainage bag You should have:  A large overnight bag.  A small leg bag.  Overnight Bag You may wear the overnight bag at any time. Always keep the bag below the level of your bladder but off the floor. When you sleep, put a clean plastic bag in a wastebasket. Then hang the bag inside the wastebasket. Leg Bag Never wear the leg bag at night.  Always wear the leg bag below your knee. Keep the leg bag secure with a leg strap or tape. How to care for your skin  Clean the skin around the catheter at least once every day.  Shower every day. Do not take baths.  Put creams, lotions, or ointments on your genital area only as told by your doctor.  Do not use powders, sprays, or lotions on your genital area. How to clean your catheter and your skin 1. Wash your hands  with soap and water. 2. Wet a washcloth in warm water and gentle (mild) soap. 3. Use the washcloth to clean the skin where the catheter enters your body. Clean downward and wipe away from the catheter in small circles. Do not wipe toward the catheter. 4. Pat the area dry with a clean towel. Make sure to clean off all soap. How to care for your drainage bags Empty your drainage bag when it is ?- full or at least 2-3 times a day. Replace your drainage bag once a month or sooner if it starts to smell bad or look dirty. Do not clean your drainage bag unless told by your doctor. Emptying a drainage bag  Supplies Needed  Rubbing alcohol.  Gauze pad or cotton ball.  Tape or a leg strap.  Steps 1. Wash your hands with soap and water. 2. Separate (detach) the bag from your leg. 3. Hold the bag over the toilet or a clean container. Keep the bag below your hips and bladder. This stops pee (urine) from going back into the tube. 4. Open the pour spout at the bottom of the bag. 5. Empty the pee into the toilet or container. Do not let the pour spout touch any surface. 6. Put rubbing alcohol on a gauze pad or cotton ball. 7. Use the gauze pad or cotton ball to clean the pour spout. 8. Close the pour spout. 9. Attach the bag to your leg with tape or a leg strap. 10. Wash your hands.  Changing a drainage bag Supplies Needed  Alcohol wipes.  A clean drainage bag.  Adhesive tape or a leg strap.  Steps 1. Wash your hands with soap and water. 2. Separate the dirty bag from your leg. 3. Pinch the rubber catheter with your fingers so that pee does not spill out. 4. Separate the catheter tube from the drainage tube where these tubes connect (at the connection valve). Do not let the tubes touch any surface. 5. Clean the end of the catheter tube with an alcohol wipe. Use a different alcohol wipe to clean the end of the drainage tube. 6. Connect the catheter tube to the drainage tube of the clean  bag. 7. Attach the new bag to the leg with adhesive tape or a leg strap. 8. Wash your hands.  How to prevent infection and other problems  Never pull on your catheter or try to remove it. Pulling can damage tissue in your body.  Always wash your hands before and after touching your catheter.  If a leg strap gets wet, replace it with a dry one.  Drink enough fluids to keep your pee clear or pale yellow, or as told by your doctor.  Do not let the drainage bag or tubing touch the floor.  Wear cotton underwear.  If you are male, wipe from front to back after you poop (have a bowel movement).  Check on the catheter often to make sure it works and the tubing is not twisted.  Get help if:  Your pee is cloudy.  Your pee smells unusually bad.  Your pee is not draining into the bag.  Your tube gets clogged.  Your catheter starts to leak.  Your bladder feels full. Get help right away if:  You have redness, swelling, or pain where the catheter enters your body.  You have fluid, pus, or a bad smell coming from the area where the catheter enters your body.  The area where the catheter enters your body feels warm.  You have a fever.  You have pain in your: ? Stomach (abdomen). ? Legs. ? Lower back. ? Bladder.  You see blood fill the catheter.  Your pee is pink or red.  You feel sick to your stomach (nauseous).  You throw up (vomit).  You have chills.  Your catheter gets pulled out. This information is not intended to replace advice given to you by your health care provider. Make sure you discuss any questions you have with your health care provider. Document Released: 04/24/2012 Document Revised: 11/26/2015 Document Reviewed: 06/12/2013 Elsevier Interactive Patient Education  2018 [employer].   Cystoscopy, Care After Refer to this sheet in the next few weeks. These instructions provide you with information about caring for yourself after your procedure. Your  health care provider may also give you more specific instructions. Your treatment has been planned according to current medical practices, but problems sometimes occur. Call your health care provider if you have any problems or questions after your procedure. What can I expect after the procedure? After the procedure, it is common to have:  Mild pain when you urinate. Pain should stop within a few minutes after you urinate. This may last for up to 1 week.  A small amount of blood in your urine for several days.  Feeling like you need to urinate but producing only a small amount of urine.  Follow these instructions at home:  Medicines  Take over-the-counter and prescription medicines only as told by your health care provider.  If you were prescribed an antibiotic medicine, take it as told by your health care provider. Do not stop taking the antibiotic even if you start to feel better. General instructions   Return to your normal activities as told by your health care provider. Ask your health care provider what activities are safe for you.  Do not drive for 24 hours if you received a sedative.  Watch for any blood in your urine. If the amount of blood in your urine increases, call your health care provider.  Follow instructions from your health care provider about eating or drinking restrictions.  If a tissue sample was removed for testing (biopsy) during your procedure, it is your responsibility to get your test results. Ask your health care provider or the department performing the test when your results will be ready.  Drink enough fluid to keep your urine clear or pale yellow.  Keep all follow-up visits as told by your health care provider. This is important. Contact a health care provider if:  You have pain that gets worse or does not get better with medicine, especially pain when you urinate.  You have difficulty urinating. Get help right away if:  You have more blood in  your urine.  You have blood clots in your urine.  You have abdominal pain.  You have a fever or chills.  You are unable to urinate. This information is not intended to replace advice given to you by your  health care provider. Make sure you discuss any questions you have with your health care provider. Document Released: 07/17/2004 Document Revised: 06/05/2015 Document Reviewed: 11/14/2014 Elsevier Interactive Patient Education  2017 [employer].

## 2016-06-18 NOTE — Progress Notes (Signed)
Discussed Op findings with patient wife. If biopsy + for HG recurrence/CIS she asked about alternatives to BCG. We discussed nature r/b of surveillance vs [place]. She elects to continue surveillance if biopsies are +.

## 2016-06-18 NOTE — H&P (Addendum)
H&P  Chief Complaint: bladder neoplasm  History of Present Illness: 83 yo h/o LG Ta and CIS with last CIS recurrence 05/2015. Recent cysto with erythematous lesion dome and posterior with suspicious cytology. His bladder pain improved. Mild dysuria. No hematuria. Brought today for cysto/bbx/fulg/RGP.   Past Medical History:  Diagnosis Date  . Arthritis    BACK  . Bifascicular block   . Bladder cancer ([place])   . BPH (benign prostatic hyperplasia)   . Chronic constipation   . Chronic restrictive lung disease   . COPD mixed type ([place])   . Coronary artery disease CARDIOLOGIST- DR Thompson Grayer   STRESS TEST 08-17-10 IN EPIC (LOW RISK ,  NO ISCHEMIA)  . GERD (gastroesophageal reflux disease)   . History of basal cell carcinoma excision BILATERAL ARMS  . History of colon polyps   . Hx of thyroid cancer    s/p total thyroidectomy,  . Hyperlipidemia   . Hypertension   . Hypothyroidism   . Hypothyroidism, postsurgical   . Nocturia   . Right bundle branch block   . Sigmoid diverticulosis   . Wears glasses   . Wears glasses    Past Surgical History:  Procedure Laterality Date  . CARDIOVASCULAR STRESS TEST  08-17-2010   dr allred   Low risk nuclear study with prominent inferobasal thinning vs small prior infarct, no ischemia/  normal LV function and wall motion , ef 63%  . CATARACT EXTRACTION W/ INTRAOCULAR LENS  IMPLANT, BILATERAL    . CYSTOSCOPY W/ RETROGRADES Bilateral 10/08/2014   Procedure: CYSTOSCOPY WITH BILATERAL RETROGRADE PYELOGRAM, BIOPSY, FULGURATION;  Surgeon: Festus Aloe, MD;  Location: [facility];  Service: Urology;  Laterality: Bilateral;  . CYSTOSCOPY WITH BIOPSY  12/16/2010   Procedure: CYSTOSCOPY WITH BIOPSY;  Surgeon: Molli Hazard, MD;  Location: [facility];  Service: Urology;  Laterality: N/A;  . CYSTOSCOPY WITH BIOPSY N/A 05/20/2015   Procedure: CYSTOSCOPY WITH BLADDER BIOPSY AND FULGERATION, CYSTOGRAM, BILATERAL RETROGRADES,  BILATERAL RENAL WASHINGS;  Surgeon: Festus Aloe, MD;  Location[facility];  Service: Urology;  Laterality: N/A;  . CYSTOSCOPY/RETROGRADE/URETEROSCOPY  12/16/2010   Procedure: CYSTOSCOPY/RETROGRADE/URETEROSCOPY;  Surgeon: Molli Hazard, MD;  Location: [facility];  Service: Urology;  Laterality: Right;  bilateral retrograde, right ureteroscopy  . EYE SURGERY     attached lens to iris  . GREEN LIGHT LASER TURP (TRANSURETHRAL RESECTION OF PROSTATE  11-02-2011  . KNEE ARTHROSCOPY Left 11/  2016  . TOTAL THYROIDECTOMY  1991  . TRANSURETHRAL RESECTION OF BLADDER TUMOR  12/16/2010   Procedure: TRANSURETHRAL RESECTION OF BLADDER TUMOR (TURBT);  Surgeon: Molli Hazard, MD;  Location: [facility];  Service: Urology;  Laterality: N/A;    Home Medications:  Prescriptions Prior to Admission  Medication Sig Dispense Refill Last Dose  . aspirin EC 81 MG tablet Take 81 mg by mouth daily.   06/13/2016  . atorvastatin (LIPITOR) 20 MG tablet Take 20 mg by mouth daily after breakfast.    06/17/2016 at Unknown time  . fluticasone furoate-vilanterol (BREO ELLIPTA) 100-25 MCG/INH AEPB Inhale 1 puff into the lungs daily as needed (for respiratory issues.).   06/17/2016 at Unknown time  . levothyroxine (SYNTHROID, LEVOTHROID) 175 MCG tablet Take 175 mcg by mouth daily after breakfast.    06/17/2016 at Unknown time  . losartan (COZAAR) 50 MG tablet Take 1 tablet (50 mg total) by mouth daily. 90 tablet 3 06/17/2016 at Unknown time  . pantoprazole (PROTONIX)  40 MG tablet Take 1 tablet (40 mg total) by mouth daily. (Patient taking differently: Take 40 mg by mouth daily after breakfast. ) 60 tablet 0 06/17/2016 at Unknown time  . testosterone enanthate (DELATESTRYL) 200 MG/ML injection Inject 200 mg into the muscle every 28 (twenty-eight) days. For IM use only   Past Month at Unknown time  . triamterene-hydrochlorothiazide (MAXZIDE-25) 37.5-25 MG tablet Take 1 tablet by  mouth daily.  0 06/17/2016 at Unknown time  . vitamin B-12 (CYANOCOBALAMIN) 1000 MCG tablet Take 1,000 mcg by mouth 3 (three) times a week.   06/14/2016  . Vitamin D, Ergocalciferol, (DRISDOL) 50000 units CAPS capsule Take 50,000 Units by mouth every Monday.    06/16/2016  . nitroGLYCERIN (NITROSTAT) 0.4 MG SL tablet Place 1 tablet (0.4 mg total) under the tongue every 5 (five) minutes as needed for chest pain. 30 tablet 12 More than a month at Unknown time  . polyethylene glycol powder (GLYCOLAX/MIRALAX) powder Take 17 g by mouth daily as needed (for constipation).   More than a month at Unknown time   Allergies: No Known Allergies  Family History  Problem Relation Age of Onset  . Lung cancer Brother   . Thyroid cancer Unknown   . Diabetes Father    Social History:  reports that he quit smoking about 34 years ago. His smoking use included Cigarettes. He has a 70.00 pack-year smoking history. He has never used smokeless tobacco. He reports that he drinks alcohol. He reports that he does not use drugs.  ROS: A complete review of systems was performed.  All systems are negative except for pertinent findings as noted. Review of Systems  All other systems reviewed and are negative.    Physical Exam:  Vital signs in last 24 hours: Temp:  [98.1 F (36.7 C)] 98.1 F (36.7 C) (06/08 0527) Pulse Rate:  [81] 81 (06/08 0527) Resp:  [18] 18 (06/08 0527) BP: (124)/(54) 124/54 (06/08 0527) SpO2:  [94 %] 94 % (06/08 0527) Weight:  [88.5 kg (195 lb)] 88.5 kg (195 lb) (06/08 0536) General:  Alert and oriented, No acute distress HEENT: Normocephalic, atraumatic Neck: No JVD or lymphadenopathy Cardiovascular: Regular rate and rhythm Lungs: Regular rate and effort Abdomen: Soft, nontender, nondistended, no abdominal masses Back: No CVA tenderness Extremities: No edema Neurologic: Grossly intact  Laboratory Data:  No results found for this or any previous visit (from the past 24 hour(s)). No  results found for this or any previous visit (from the past 240 hour(s)). Creatinine:  Recent Labs  06/15/16 1049  CREATININE 1.27*    Impression/Assessment:  Bladder erythema - h/o CIS  Plan:  I discussed with the patient the nature, potential benefits, risks and alternatives to cysto/bbx/fulg/RGP, including side effects of the proposed treatment, the likelihood of the patient achieving the goals of the procedure, and any potential problems that might occur during the procedure or recuperation. All questions answered. Patient elects to proceed. He also requested a foley remain for a day or two. He had trouble voiding after a biopsy and required a foley to be replaced.   I explained I arrived to pre-op about 7:38 because of multidisciplinary tumor board meeting.   Alik Mawson 06/18/2016, 7:46 AM

## 2016-06-18 NOTE — Anesthesia Procedure Notes (Signed)
Procedure Name: LMA Insertion Date/Time: 06/18/2016 7:19 AM Performed by: Lyn Hollingshead Pre-anesthesia Checklist: Patient identified, Suction available, Patient being monitored and Timeout performed Patient Re-evaluated:Patient Re-evaluated prior to inductionOxygen Delivery Method: Circle system utilized Preoxygenation: Pre-oxygenation with 100% oxygen Intubation Type: IV induction Ventilation: Mask ventilation without difficulty LMA: LMA inserted LMA Size: 4.0 Number of attempts: 2 Placement Confirmation: positive ETCO2 Tube secured with: Tape Comments: Attempted no. 5 proseal per past LMA episodes.  Unable to pass due to limited oral opening.  No. 4 passed on second attempt.

## 2016-06-18 NOTE — Op Note (Signed)
Preoperative diagnosis: Bladder neoplasm/erythema Postoperative diagnosis: Same  Procedure: Cystoscopy with bladder biopsy and fulguration to eliminate about 2 cm of erythematous mucosa, bilateral renal washes, retrograde pyelograms bilateral  Surgeon: Junious Silk  Anesthesia: Gen.  Indication for procedure: 83 year old with history of low-grade TA disease and CIS. Recent cytology was suspicious and there were some erythematous areas in the posterior bladder. He was brought for above procedure.  Findings: On exam under anesthesia the penis was uncircumcised and the foreskin was normal. The testicles were descended bilaterally and palpably normal. On digital rectal exam the prostate was small and benign.  On cystoscopy there was a prior TURP with patent prostatic fossa and bladder neck. The urethra was normal. The trigone and ureteral orifices were a little bit laterally displaced but normal. Good clear efflux. The bladder was moderately trabeculated. There was some erythematous mucosa loosely configured on the right and left and some toward the dome. There were no papillary tumors. No stones or foreign bodies in the bladder.  Right retrograde pyelogram-this outlined a single ureter single collecting system unit without filling defect, stricture or dilation. Initially there was some mobile filling defects at the UPJ and in the proximal ureter which I felt this to be air. On delayed images and repeat injections I did not appreciate any filling defects.  Left retrograde pyelogram-this outlined a single ureter single collecting system unit without filling defect, stricture or dilation.  Description of procedure: After consent was obtained patient brought to the operating room. After adequate anesthesia he was placed in lithotomy position. An exam under anesthesia was performed. He was prepped and draped in the usual sterile fashion. A timeout was performed to confirm the patient and procedure. The  cystoscope was passed per urethra and the bladder inspected with the 30 and 70 lens. The bladder neck and prostate was somewhat fixed from his prior surgery which may deflexion of the scope a little bit difficult. I then biopsied the right posterior then the left posterior then the dome and these areas were fulgurated. The bladder was rinsed many times in the right ureter cannulated with a 6 [place] open-ended catheter which is guided up to the renal pelvis with a sensor wire. The wire was removed and saline washes were obtained of the right collecting system. Retrograde injection of contrast was performed. This appeared to have introduced some air into the system. Attention was then turned to the left side and similarly the left side was cannulated with a new 6 French catheter guided into the left renal pelvis with a new sensor wire. Left renal washes were obtained with saline. Left retrograde injection of contrast was performed as I withdrew the 6 [place] open-ended catheter similar to the right. I went back to the right and the UPJ did appear that it filled with contrast and was solid and had no remaining filling defect in the be certain I injected some more contrast retrograde and this appeared normal. The scope was removed after making sure there was excellent hemostasis at all the biopsy sites. A 16 French Foley was placed. The patient was awakened taken to recovery room in stable condition.  Complications: None  Blood loss: Minimal  Specimens to pathology: 1) Right bladder biopsy, 2) left bladder biopsy, 3) posterior dome biopsy  Drains: 16 Fr foley   Disposition: Patient stable to PACU

## 2016-06-18 NOTE — Anesthesia Postprocedure Evaluation (Signed)
Anesthesia Post Note  Patient: Jose Lawson.  Procedure(s) Performed: Procedure(s) (LRB): CYSTOSCOPY WITH BIOPSY FULGURATION (N/A) CYSTOSCOPY WITH RETROGRADE PYELOGRAM (Bilateral)     Anesthesia Post Evaluation  Last Vitals:  Vitals:   06/18/16 0930 06/18/16 0944  BP: 132/64 130/62  Pulse: 79 81  Resp: 11 (!) 50  Temp: 36.4 C 36.4 C    Last Pain:  Vitals:   06/18/16 0527  TempSrc: Oral   Pain Goal:                 Mamoudou Mulvehill JR,Jasaiah Veronica Fretz

## 2016-06-25 DIAGNOSIS — D090 Carcinoma in situ of bladder: Secondary | ICD-10-CM | POA: Diagnosis not present

## 2016-06-25 DIAGNOSIS — R300 Dysuria: Secondary | ICD-10-CM | POA: Diagnosis not present

## 2016-06-29 DIAGNOSIS — R300 Dysuria: Secondary | ICD-10-CM | POA: Diagnosis not present

## 2016-06-29 DIAGNOSIS — C678 Malignant neoplasm of overlapping sites of bladder: Secondary | ICD-10-CM | POA: Diagnosis not present

## 2016-07-03 ENCOUNTER — Telehealth: Payer: Self-pay | Admitting: Oncology

## 2016-07-03 NOTE — Telephone Encounter (Signed)
Spoke with patient re 7/17 new patient appointment with Dr. Alen Blew at 2 pm to arrive 1:30 pm. Demographic and insurance information confirmed.

## 2016-07-06 DIAGNOSIS — E298 Other testicular dysfunction: Secondary | ICD-10-CM | POA: Diagnosis not present

## 2016-07-07 DIAGNOSIS — M1711 Unilateral primary osteoarthritis, right knee: Secondary | ICD-10-CM | POA: Diagnosis not present

## 2016-07-07 DIAGNOSIS — M170 Bilateral primary osteoarthritis of knee: Secondary | ICD-10-CM | POA: Diagnosis not present

## 2016-07-07 DIAGNOSIS — M1712 Unilateral primary osteoarthritis, left knee: Secondary | ICD-10-CM | POA: Diagnosis not present

## 2016-07-27 ENCOUNTER — Ambulatory Visit (HOSPITAL_BASED_OUTPATIENT_CLINIC_OR_DEPARTMENT_OTHER): Payer: Medicare Other | Admitting: Oncology

## 2016-07-27 VITALS — BP 155/53 | HR 73 | Temp 97.6°F | Resp 18 | Ht 76.0 in | Wt 195.4 lb

## 2016-07-27 DIAGNOSIS — C679 Malignant neoplasm of bladder, unspecified: Secondary | ICD-10-CM

## 2016-07-27 DIAGNOSIS — Z801 Family history of malignant neoplasm of trachea, bronchus and lung: Secondary | ICD-10-CM

## 2016-07-27 DIAGNOSIS — D090 Carcinoma in situ of bladder: Secondary | ICD-10-CM

## 2016-07-27 DIAGNOSIS — Z87891 Personal history of nicotine dependence: Secondary | ICD-10-CM

## 2016-07-27 NOTE — Progress Notes (Signed)
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   HPI: This is an 83 year old gentleman currently of [place] where he lived the majority of his life. He is native of [place] however. He is a gentleman with history of arthritis, COPD and coronary artery disease. He was diagnosed with recurrent bladder tumors taken back to 2014. He was treated with BCG in February 2014 and therapy was discontinued because of fever at half the BCG dose. He has been followed by Dr. Junious Silk with repeat fulguration as needed. 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 There is suspicious of focal invasion was noted at that time. He has also had reported the symptoms of penile pain without any evidence of urinary tract infection. He denied any hematuria or dysuria at this time. He denied any constitutional symptoms. He continues to perform most activities of daily living without any decline. Despite his age, he is able to drive and work in his [place].  He does not report any headaches, blurry vision, syncope or seizures. He does not report any fevers, chills or sweats. He does not report any cough, wheezing or hemoptysis. He does not report any nausea, vomiting or abdominal pain. He does not report any frequency urgency or hesitancy. He does not report any skeletal complaints. Remaining review of systems unremarkable.   Past Medical History:  Diagnosis Date  . Arthritis    BACK  . Bifascicular block   . Bladder cancer ([place])   . BPH (benign prostatic hyperplasia)   . Chronic constipation   . Chronic restrictive lung disease   . COPD mixed type ([place])   . Coronary artery disease CARDIOLOGIST- DR Thompson Grayer   STRESS TEST 08-17-10 IN EPIC (LOW RISK ,  NO ISCHEMIA)  . GERD (gastroesophageal reflux disease)   . History of basal cell carcinoma excision BILATERAL ARMS  . History of  colon polyps   . Hx of thyroid cancer    s/p total thyroidectomy,  . Hyperlipidemia   . Hypertension   . Hypothyroidism   . Hypothyroidism, postsurgical   . Nocturia   . Right bundle branch block   . Sigmoid diverticulosis   . Wears glasses   . Wears glasses   :  Past Surgical History:  Procedure Laterality Date  . CARDIOVASCULAR STRESS TEST  08-17-2010   dr allred   Low risk nuclear study with prominent inferobasal thinning vs small prior infarct, no ischemia/  normal LV function and wall motion , ef 63%  . CATARACT EXTRACTION W/ INTRAOCULAR LENS  IMPLANT, BILATERAL    . CYSTOSCOPY W/ RETROGRADES Bilateral 10/08/2014   Procedure: CYSTOSCOPY WITH BILATERAL RETROGRADE PYELOGRAM, BIOPSY, FULGURATION;  Surgeon: Festus Aloe, MD;  Location: [facility];  Service: Urology;  Laterality: Bilateral;  . CYSTOSCOPY W/ RETROGRADES Bilateral 06/18/2016   Procedure: CYSTOSCOPY WITH RETROGRADE PYELOGRAM;  Surgeon: Festus Aloe, MD;  Location: [facility];  Service: Urology;  Laterality: Bilateral;  . CYSTOSCOPY WITH BIOPSY  12/16/2010   Procedure: CYSTOSCOPY WITH BIOPSY;  Surgeon: Molli Hazard, MD;  Location: [facility];  Service: Urology;  Laterality: N/A;  . CYSTOSCOPY WITH BIOPSY N/A 05/20/2015   Procedure: CYSTOSCOPY WITH BLADDER BIOPSY AND FULGERATION, CYSTOGRAM, BILATERAL RETROGRADES, BILATERAL RENAL WASHINGS;  Surgeon: Festus Aloe, MD;  Location: [facility];  Service: Urology;  Laterality: N/A;  . CYSTOSCOPY WITH BIOPSY N/A 06/18/2016  Procedure: CYSTOSCOPY WITH BIOPSY FULGURATION;  Surgeon: Festus Aloe, MD;  Location: [facility];  Service: Urology;  Laterality: N/A;  . CYSTOSCOPY/RETROGRADE/URETEROSCOPY  12/16/2010   Procedure: CYSTOSCOPY/RETROGRADE/URETEROSCOPY;  Surgeon: Molli Hazard, MD;  Location: [facility];  Service: Urology;  Laterality: Right;  bilateral retrograde, right ureteroscopy  . EYE SURGERY      attached lens to iris  . GREEN LIGHT LASER TURP (TRANSURETHRAL RESECTION OF PROSTATE  11-02-2011  . KNEE ARTHROSCOPY Left 11/  2016  . TOTAL THYROIDECTOMY  1991  . TRANSURETHRAL RESECTION OF BLADDER TUMOR  12/16/2010   Procedure: TRANSURETHRAL RESECTION OF BLADDER TUMOR (TURBT);  Surgeon: Molli Hazard, MD;  Location: [facility];  Service: Urology;  Laterality: N/A;  :   Current Outpatient Prescriptions:  .  aspirin EC 81 MG tablet, Take 81 mg by mouth daily., Disp: , Rfl:  .  atorvastatin (LIPITOR) 20 MG tablet, Take 20 mg by mouth daily after breakfast. , Disp: , Rfl:  .  fluticasone furoate-vilanterol (BREO ELLIPTA) 100-25 MCG/INH AEPB, Inhale 1 puff into the lungs daily as needed (for respiratory issues.)., Disp: , Rfl:  .  levothyroxine (SYNTHROID, LEVOTHROID) 175 MCG tablet, Take 175 mcg by mouth daily after breakfast. , Disp: , Rfl:  .  losartan (COZAAR) 50 MG tablet, Take 1 tablet (50 mg total) by mouth daily., Disp: 90 tablet, Rfl: 3 .  pantoprazole (PROTONIX) 40 MG tablet, Take 1 tablet (40 mg total) by mouth daily. (Patient taking differently: Take 40 mg by mouth daily after breakfast. ), Disp: 60 tablet, Rfl: 0 .  polyethylene glycol powder (GLYCOLAX/MIRALAX) powder, Take 17 g by mouth daily as needed (for constipation)., Disp: , Rfl:  .  testosterone enanthate (DELATESTRYL) 200 MG/ML injection, Inject 200 mg into the muscle every 28 (twenty-eight) days. For IM use only, Disp: , Rfl:  .  triamterene-hydrochlorothiazide (MAXZIDE-25) 37.5-25 MG tablet, Take 1 tablet by mouth daily., Disp: , Rfl: 0 .  vitamin B-12 (CYANOCOBALAMIN) 1000 MCG tablet, Take 1,000 mcg by mouth 3 (three) times a week., Disp: , Rfl:  .  Vitamin D, Ergocalciferol, (DRISDOL) 50000 units CAPS capsule, Take 50,000 Units by mouth every Monday. , Disp: , Rfl:  .  nitroGLYCERIN (NITROSTAT) 0.4 MG SL tablet, Place 1 tablet (0.4 mg total) under the tongue every 5 (five) minutes as needed for  chest pain. (Patient not taking: Reported on 07/27/2016), Disp: 30 tablet, Rfl: 12:  No Known Allergies:  Family History  Problem Relation Age of Onset  . Lung cancer Brother   . Thyroid cancer Unknown   . Diabetes Father   :  Social History   Social History  . Marital status: Married    Spouse name: N/A  . Number of children: N/A  . Years of education: N/A   Occupational History  . retired    Social History Main Topics  . Smoking status: Former Smoker    Packs/day: 2.00    Years: 35.00    Types: Cigarettes    Quit date: 01/11/1982  . Smokeless tobacco: Never Used  . Alcohol use 0.0 oz/week     Comment: 6-oz vodka per day   . Drug use: No  . Sexual activity: Not on file   Other Topics Concern  . Not on file   Social History Narrative   Lives in [place] with his spouse.   Retired from [employer].  :  Pertinent items are noted in HPI.  Exam: Blood pressure (!) 155/53, pulse  73, temperature 97.6 F (36.4 C), temperature source Oral, resp. rate 18, height 6\' 4"  (1.93 m), weight 195 lb 6.4 oz (88.6 kg), SpO2 99 %. General appearance: alert and cooperative Throat: lips, mucosa, and tongue normal; teeth and gums normal Neck: no adenopathy Back: negative Resp: clear to auscultation bilaterally Chest wall: no tenderness Cardio: regular rate and rhythm, S1, S2 normal, no murmur, click, rub or gallop GI: soft, non-tender; bowel sounds normal; no masses,  no organomegaly Extremities: extremities normal, atraumatic, no cyanosis or edema Skin: Skin color, texture, turgor normal. No rashes or lesions  CBC    Component Value Date/Time   WBC 8.4 06/15/2016 1049   RBC 4.76 06/15/2016 1049   HGB 12.0 (L) 06/15/2016 1049   HCT 39.7 06/15/2016 1049   PLT 446 (H) 06/15/2016 1049   MCV 83.4 06/15/2016 1049   MCH 25.2 (L) 06/15/2016 1049   MCHC 30.2 06/15/2016 1049   RDW 17.5 (H) 06/15/2016 1049   LYMPHSABS 0.7 06/16/2014 0930   MONOABS 0.6 06/16/2014 0930   EOSABS 0.4  06/16/2014 0930   BASOSABS 0.0 06/16/2014 0930      Chemistry      Component Value Date/Time   NA 141 06/15/2016 1049   K 5.0 06/15/2016 1049   CL 103 06/15/2016 1049   CO2 29 06/15/2016 1049   BUN 27 (H) 06/15/2016 1049   CREATININE 1.27 (H) 06/15/2016 1049      Component Value Date/Time   CALCIUM 9.6 06/15/2016 1049   ALKPHOS 60 05/02/2015 1000   AST 18 05/02/2015 1000   ALT 17 05/02/2015 1000   BILITOT 1.1 05/02/2015 1000       Assessment and Plan:   83 year old gentleman with the following issues:  1. Recurrent carcinoma in situ diagnosed in 2013. He had multiple cystoscopies and fulguration periodically. He also had the BCG most recently 2014 and therapy was discontinued because of poor tolerance and fever. His most recent cystoscopy in June 2018 showed recurrent areas of carcinoma in situ with one area of the dome of the bladder suggestive of focal invasion.  The natural course of this disease was discussed with the patient and his wife. Options of therapy which include continued conservative approach with repeat cystoscopy and fulguration versus attempting additional intravesicular therapy. These options would include repeat BCG versus mitomycin-C. The logistics of administration of mitomycin-C was discussed today in detail. These complications associated with this medication were also reviewed which includes irritation of the bladder, hematuria but no systemic complications. The benefit would be included a delay of recurrence of recurrent tumors.  After discussion today, he would like to think about this option and discuss it further with Dr. Junious Silk before pursuing any additional therapy. I did advise him that conservative approach is also reasonable alternative at this time.  2. Follow-up he will let me know in the future if he is interested in the treatment and at that time we'll decide on follow-up.

## 2016-08-02 DIAGNOSIS — R309 Painful micturition, unspecified: Secondary | ICD-10-CM | POA: Diagnosis not present

## 2016-08-02 DIAGNOSIS — R7301 Impaired fasting glucose: Secondary | ICD-10-CM | POA: Diagnosis not present

## 2016-08-02 DIAGNOSIS — R296 Repeated falls: Secondary | ICD-10-CM | POA: Diagnosis not present

## 2016-08-02 DIAGNOSIS — I10 Essential (primary) hypertension: Secondary | ICD-10-CM | POA: Diagnosis not present

## 2016-08-05 DIAGNOSIS — E298 Other testicular dysfunction: Secondary | ICD-10-CM | POA: Diagnosis not present

## 2016-08-27 DIAGNOSIS — R300 Dysuria: Secondary | ICD-10-CM | POA: Diagnosis not present

## 2016-08-27 DIAGNOSIS — C678 Malignant neoplasm of overlapping sites of bladder: Secondary | ICD-10-CM | POA: Diagnosis not present

## 2016-08-27 DIAGNOSIS — N451 Epididymitis: Secondary | ICD-10-CM | POA: Diagnosis not present

## 2016-09-02 DIAGNOSIS — Z85828 Personal history of other malignant neoplasm of skin: Secondary | ICD-10-CM | POA: Diagnosis not present

## 2016-09-02 DIAGNOSIS — L570 Actinic keratosis: Secondary | ICD-10-CM | POA: Diagnosis not present

## 2016-09-02 DIAGNOSIS — E291 Testicular hypofunction: Secondary | ICD-10-CM | POA: Diagnosis not present

## 2016-09-02 DIAGNOSIS — D485 Neoplasm of uncertain behavior of skin: Secondary | ICD-10-CM | POA: Diagnosis not present

## 2016-09-02 DIAGNOSIS — D0461 Carcinoma in situ of skin of right upper limb, including shoulder: Secondary | ICD-10-CM | POA: Diagnosis not present

## 2016-09-02 DIAGNOSIS — L821 Other seborrheic keratosis: Secondary | ICD-10-CM | POA: Diagnosis not present

## 2016-09-02 DIAGNOSIS — D0439 Carcinoma in situ of skin of other parts of face: Secondary | ICD-10-CM | POA: Diagnosis not present

## 2016-09-28 DIAGNOSIS — N50812 Left testicular pain: Secondary | ICD-10-CM | POA: Diagnosis not present

## 2016-09-28 DIAGNOSIS — C672 Malignant neoplasm of lateral wall of bladder: Secondary | ICD-10-CM | POA: Diagnosis not present

## 2016-09-30 ENCOUNTER — Encounter: Payer: Self-pay | Admitting: Pulmonary Disease

## 2016-09-30 ENCOUNTER — Ambulatory Visit (INDEPENDENT_AMBULATORY_CARE_PROVIDER_SITE_OTHER): Payer: Medicare Other | Admitting: Pulmonary Disease

## 2016-09-30 VITALS — BP 122/60 | HR 81 | Ht 76.0 in | Wt 200.0 lb

## 2016-09-30 DIAGNOSIS — R0602 Shortness of breath: Secondary | ICD-10-CM

## 2016-09-30 DIAGNOSIS — Z23 Encounter for immunization: Secondary | ICD-10-CM | POA: Diagnosis not present

## 2016-09-30 DIAGNOSIS — J8410 Pulmonary fibrosis, unspecified: Secondary | ICD-10-CM | POA: Diagnosis not present

## 2016-09-30 MED ORDER — TIOTROPIUM BROMIDE-OLODATEROL 2.5-2.5 MCG/ACT IN AERS: 2.0000 | INHALATION_SPRAY | Freq: Every day | RESPIRATORY_TRACT | 0 refills | Status: AC

## 2016-09-30 MED ORDER — TIOTROPIUM BROMIDE-OLODATEROL 2.5-2.5 MCG/ACT IN AERS: 2.0000 | INHALATION_SPRAY | Freq: Every day | RESPIRATORY_TRACT | 5 refills | Status: DC

## 2016-09-30 NOTE — Patient Instructions (Signed)
We will schedule you for a high-resolution CT of the chest and pulmonary function tests Stop the breo. We will give a sample and a prescription for Stiolto inhaler.  Follow up in 1-2 months

## 2016-09-30 NOTE — Progress Notes (Addendum)
Jose KUNESH Sr.    [id number]    11[birth year]27  Primary Care Physician:[name], [name], MD  Referring Physician: [name], MD [address] [employer], [place] [address]  Chief complaint:  Consult for management of dyspnea, COPD  HPI: 83 year old with history of COPD (CAT score 9), GERD, thyroid and bladder cancer. He was previously evaluated in the pulmonary clinic in 2014 for dyspnea. PFTs at that time showed moderate obstruction. He is currently on breo inhaler which was started 2 months ago. He feels this does not help much and makes his cough worse. His chief complaint today is dyspnea on exertion which has worsend over the last 2 months. He denies dyspnea at rest. No sputum production, fevers, chills.  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 Exposures: No known exposures to asbestos or any other exposures. Smoking history: 2 packs per day for 40 years. Quit in 1982.  Outpatient Encounter Prescriptions as of 09/30/2016  Medication Sig  . aspirin EC 81 MG tablet Take 81 mg by mouth daily.  [name] atorvastatin (LIPITOR) 20 MG tablet Take 20 mg by mouth daily after breakfast.   . fluticasone furoate-vilanterol (BREO ELLIPTA) 100-25 MCG/INH AEPB Inhale 1 puff into the lungs daily as needed (for respiratory issues.).  [name] levothyroxine (SYNTHROID, LEVOTHROID) 175 MCG tablet Take 175 mcg by mouth daily after breakfast.   . losartan (COZAAR) 50 MG tablet Take 1 tablet (50 mg total) by mouth daily.  . nitroGLYCERIN (NITROSTAT) 0.4 MG SL tablet Place 1 tablet (0.4 mg total) under the tongue every 5 (five) minutes as needed for chest pain. (Patient not taking: Reported on 07/27/2016)  . pantoprazole (PROTONIX) 40 MG tablet Take 1 tablet (40 mg total) by mouth daily. (Patient taking differently: Take 40 mg by mouth daily after breakfast. )  . polyethylene glycol powder (GLYCOLAX/MIRALAX) powder Take 17 g by mouth  daily as needed (for constipation).  [name] testosterone enanthate (DELATESTRYL) 200 MG/ML injection Inject 200 mg into the muscle every 28 (twenty-eight) days. For IM use only  . triamterene-hydrochlorothiazide (MAXZIDE-25) 37.5-25 MG tablet Take 1 tablet by mouth daily.  . vitamin B-12 (CYANOCOBALAMIN) 1000 MCG tablet Take 1,000 mcg by mouth 3 (three) times a week.  . Vitamin D, Ergocalciferol, (DRISDOL) 50000 units CAPS capsule Take 50,000 Units by mouth every Monday.    No facility-administered encounter medications on file as of 09/30/2016.     Allergies as of 09/30/2016  . (No Known Allergies)    Past Medical History:  Diagnosis Date  . Arthritis    BACK  . Bifascicular block   . Bladder cancer ([place])   . BPH (benign prostatic hyperplasia)   . Chronic constipation   . Chronic restrictive lung disease   . COPD mixed type ([place])   . Coronary artery disease CARDIOLOGIST- DR Thompson Grayer   STRESS TEST 08-17-10 IN EPIC (LOW RISK ,  NO ISCHEMIA)  . GERD (gastroesophageal reflux disease)   . History of basal cell carcinoma excision BILATERAL ARMS  . History of colon polyps   . Hx of thyroid cancer    s/p total thyroidectomy,  . Hyperlipidemia   . Hypertension   . Hypothyroidism   . Hypothyroidism, postsurgical   . Nocturia   . Right bundle branch block   . Sigmoid diverticulosis   . Wears glasses   . Wears glasses     Past Surgical History:  Procedure Laterality  Date  . CARDIOVASCULAR STRESS TEST  08-17-2010   dr allred   Low risk nuclear study with prominent inferobasal thinning vs small prior infarct, no ischemia/  normal LV function and wall motion , ef 63%  . CATARACT EXTRACTION W/ INTRAOCULAR LENS  IMPLANT, BILATERAL    . CYSTOSCOPY W/ RETROGRADES Bilateral 10/08/2014   Procedure: CYSTOSCOPY WITH BILATERAL RETROGRADE PYELOGRAM, BIOPSY, FULGURATION;  Surgeon: Festus Aloe, MD;  Location: [facility];  Service: Urology;  Laterality: Bilateral;  . CYSTOSCOPY  W/ RETROGRADES Bilateral 06/18/2016   Procedure: CYSTOSCOPY WITH RETROGRADE PYELOGRAM;  Surgeon: Festus Aloe, MD;  Location[facility];  Service: Urology;  Laterality: Bilateral;  . CYSTOSCOPY WITH BIOPSY  12/16/2010   Procedure: CYSTOSCOPY WITH BIOPSY;  Surgeon: Molli Hazard, MD;  Location: [facility];  Service: Urology;  Laterality: N/A;  . CYSTOSCOPY WITH BIOPSY N/A 05/20/2015   Procedure: CYSTOSCOPY WITH BLADDER BIOPSY AND FULGERATION, CYSTOGRAM, BILATERAL RETROGRADES, BILATERAL RENAL WASHINGS;  Surgeon: Festus Aloe, MD;  Location: [facility];  Service: Urology;  Laterality: N/A;  . CYSTOSCOPY WITH BIOPSY N/A 06/18/2016   Procedure: CYSTOSCOPY WITH BIOPSY FULGURATION;  Surgeon: Festus Aloe, MD;  Location: [facility];  Service: Urology;  Laterality: N/A;  . CYSTOSCOPY/RETROGRADE/URETEROSCOPY  12/16/2010   Procedure: CYSTOSCOPY/RETROGRADE/URETEROSCOPY;  Surgeon: Molli Hazard, MD;  Location: [facility];  Service: Urology;  Laterality: Right;  bilateral retrograde, right ureteroscopy  . EYE SURGERY     attached lens to iris  . GREEN LIGHT LASER TURP (TRANSURETHRAL RESECTION OF PROSTATE  11-02-2011  . KNEE ARTHROSCOPY Left 11/  2016  . TOTAL THYROIDECTOMY  1991  . TRANSURETHRAL RESECTION OF BLADDER TUMOR  12/16/2010   Procedure: TRANSURETHRAL RESECTION OF BLADDER TUMOR (TURBT);  Surgeon: Molli Hazard, MD;  Location: [facility];  Service: Urology;  Laterality: N/A;    Family History  Problem Relation Age of Onset  . Lung cancer Brother   . Thyroid cancer Unknown   . Diabetes Father     Social History   Social History  . Marital status: Married    Spouse name: N/A  . Number of children: N/A  . Years of education: N/A   Occupational History  . retired    Social History Main Topics  . Smoking status: Former Smoker    Packs/day: 2.00    Years: 35.00    Types: Cigarettes    Quit date:  01/11/1982  . Smokeless tobacco: Never Used  . Alcohol use 0.0 oz/week     Comment: 6-oz vodka per day   . Drug use: No  . Sexual activity: Not on file   Other Topics Concern  . Not on file   Social History Narrative   Lives in [place] with his spouse.   Retired from [employer].   Review of systems: Review of Systems  Constitutional: Negative for fever and chills.  HENT: Negative.   Eyes: Negative for blurred vision.  Respiratory: as per HPI  Cardiovascular: Negative for chest pain and palpitations.  Gastrointestinal: Negative for vomiting, diarrhea, blood per rectum. Genitourinary: Negative for dysuria, urgency, frequency and hematuria.  Musculoskeletal: Negative for myalgias, back pain and joint pain.  Skin: Negative for itching and rash.  Neurological: Negative for dizziness, tremors, focal weakness, seizures and loss of consciousness.  Endo/Heme/Allergies: Negative for environmental allergies.  Psychiatric/Behavioral: Negative for depression, suicidal ideas and hallucinations.  All other systems reviewed and are negative.  Physical Exam: There were no vitals taken  for this visit. Gen:      No acute distress HEENT:  EOMI, sclera anicteric Neck:     No masses; no thyromegaly Lungs:    Clear to auscultation bilaterally; normal respiratory effort CV:         Regular rate and rhythm; no murmurs Abd:      + bowel sounds; soft, non-tender; no palpable masses, no distension Ext:    No edema; adequate peripheral perfusion Skin:      Warm and dry; no rash Neuro: alert and oriented x 3 Psych: normal mood and affect  Data Reviewed: PFTs 07/20/12 FVC 2.66 [52%], FEV1 1.85 (53%], F/F 69, TLC 72%, RV/TLC 134%, DLCO 28%. Moderate-severe obstruction, mild restriction with airtrapping, severe diffusion impairment.  CT abdomen 05/30/13-mild reticular opacities/scarring at the lung bases right greater than left Chest x-ray 05/02/15-hyperinflation, mild lung base scarring Chest x-ray  06/16/14-hyperinflation, no active cardiopulmonary disease. I reviewed all images personally.  Assessment:  Dyspnea Moderate COPD [name] has progressive dyspnea on exertion. Review of his PFTs show moderate severe obstruction in 2014.  His symtoms could be from progression of his COPD. He does not have any symptoms suggestive of asthma. I believe he will do better on the LABA/LAMA inhaler. We will stop the breo and start him on stiolto.  Get PFTs for a more recent evaluation of his lung function  We checked ambulatory O2 levels on ambulation today and it remained about 90%  Restriction with diffusion impairment. Review of his imaging shows mild scarring at the lung base. PFTs show restriction with reduction in diffusion capacity that seems out of proportion to his obstruction and restriction. I would like him evaluated for pulmonary fibrosis. We'll order a high-resolution CT scan of the chest  Plan/Recommendations: - High res CT of the chest, PFTs - Stop breo, Start stioltio   Marshell Garfinkel MD  Pulmonary and Critical Care Pager [phone] 09/30/2016, 9:18 AM  CC: [name], MD

## 2016-10-06 ENCOUNTER — Ambulatory Visit (INDEPENDENT_AMBULATORY_CARE_PROVIDER_SITE_OTHER): Payer: Medicare Other | Admitting: Pulmonary Disease

## 2016-10-06 DIAGNOSIS — R0602 Shortness of breath: Secondary | ICD-10-CM

## 2016-10-06 LAB — PULMONARY FUNCTION TEST
DL/VA % pred: 58 %
DL/VA: 2.84 ml/min/mmHg/L
DLCO COR % PRED: 36 %
DLCO COR: 14.66 ml/min/mmHg
DLCO UNC: 14.83 ml/min/mmHg
DLCO unc % pred: 36 %
FEF 25-75 POST: 1.3 L/s
FEF 25-75 Pre: 1.05 L/sec
FEF2575-%Change-Post: 24 %
FEF2575-%PRED-PRE: 49 %
FEF2575-%Pred-Post: 61 %
FEV1-%Change-Post: 4 %
FEV1-%PRED-POST: 57 %
FEV1-%PRED-PRE: 55 %
FEV1-POST: 1.91 L
FEV1-Pre: 1.83 L
FEV1FVC-%Change-Post: 8 %
FEV1FVC-%PRED-PRE: 100 %
FEV6-%Change-Post: 0 %
FEV6-%PRED-POST: 56 %
FEV6-%Pred-Pre: 57 %
FEV6-POST: 2.49 L
FEV6-Pre: 2.51 L
FEV6FVC-%CHANGE-POST: 3 %
FEV6FVC-%PRED-POST: 107 %
FEV6FVC-%Pred-Pre: 103 %
FVC-%CHANGE-POST: -4 %
FVC-%Pred-Post: 53 %
FVC-%Pred-Pre: 55 %
FVC-Post: 2.49 L
FVC-Pre: 2.61 L
PRE FEV1/FVC RATIO: 70 %
Post FEV1/FVC ratio: 76 %
Post FEV6/FVC ratio: 100 %
Pre FEV6/FVC Ratio: 96 %
RV % pred: 108 %
RV: 3.44 L
TLC % PRED: 73 %
TLC: 6.13 L

## 2016-10-06 NOTE — Progress Notes (Signed)
PFT done today. 

## 2016-10-07 ENCOUNTER — Ambulatory Visit (INDEPENDENT_AMBULATORY_CARE_PROVIDER_SITE_OTHER)
Admission: RE | Admit: 2016-10-07 | Discharge: 2016-10-07 | Disposition: A | Discharge status: Home / Self Care | Payer: Medicare Other | Source: Ambulatory Visit | Attending: Pulmonary Disease | Admitting: Pulmonary Disease

## 2016-10-07 DIAGNOSIS — R0600 Dyspnea, unspecified: Secondary | ICD-10-CM | POA: Diagnosis not present

## 2016-10-07 DIAGNOSIS — J8410 Pulmonary fibrosis, unspecified: Secondary | ICD-10-CM | POA: Diagnosis not present

## 2016-10-08 DIAGNOSIS — M170 Bilateral primary osteoarthritis of knee: Secondary | ICD-10-CM | POA: Diagnosis not present

## 2016-10-08 DIAGNOSIS — M1712 Unilateral primary osteoarthritis, left knee: Secondary | ICD-10-CM | POA: Diagnosis not present

## 2016-10-08 DIAGNOSIS — M1711 Unilateral primary osteoarthritis, right knee: Secondary | ICD-10-CM | POA: Diagnosis not present

## 2016-10-11 ENCOUNTER — Telehealth: Payer: Self-pay

## 2016-10-11 DIAGNOSIS — R918 Other nonspecific abnormal finding of lung field: Secondary | ICD-10-CM

## 2016-10-11 NOTE — Telephone Encounter (Signed)
Called and spoke with patient per PM request to confirm CT without contrast is ordered in 6 months to follow up on pt's pulmonary nodules. Ordered the CT this morning for 6 months out, and pt verbalized understanding and had no further questions or concerns.  Pt's wife asked if using inhalers on daily basis is still needed. Per PM he advised pt to remain on daily inhaler use until further notified.

## 2016-10-11 NOTE — Telephone Encounter (Signed)
-----   Message from Marshell Garfinkel, MD sent at 10/11/2016  9:00 AM EDT ----- I called and discussed the results with Mr. Jose Lawson and his wife.  There is no evidence of ILD. There are small pulmonary nodules that can be followed in 6 months There is evidence of coronary artery disease and possible pulmonary HTN. I have recommended that he follow up with Dr. Rayann Heman, his cardiologist and consider echo for eval of pulmonary HTN.  Please order CT without contrast in 6 months.

## 2016-10-15 ENCOUNTER — Ambulatory Visit (INDEPENDENT_AMBULATORY_CARE_PROVIDER_SITE_OTHER): Payer: Medicare Other | Admitting: Physician Assistant

## 2016-10-15 VITALS — BP 136/68 | HR 72 | Ht 76.0 in | Wt 202.0 lb

## 2016-10-15 DIAGNOSIS — I4510 Unspecified right bundle-branch block: Secondary | ICD-10-CM | POA: Diagnosis not present

## 2016-10-15 DIAGNOSIS — I272 Pulmonary hypertension, unspecified: Secondary | ICD-10-CM

## 2016-10-15 DIAGNOSIS — R0602 Shortness of breath: Secondary | ICD-10-CM

## 2016-10-15 DIAGNOSIS — I452 Bifascicular block: Secondary | ICD-10-CM

## 2016-10-15 NOTE — Progress Notes (Signed)
Cardiology Office Note Date:  10/15/2016  Patient ID:  Jose POET Sr., DOB 03-02-1925, MRN [id number] PCP:  [name], MD  Cardiologist:  Dr. Rayann Heman (last 2016 and prior to that 2012))    Chief Complaint:  SOB  History of Present Illness: Jose KOUDELKA Sr. is a 83 y.o. male with history of COPD, HTN, HLD, hypothyroid (s/p thyroidectomy 2/2 ca), bladder ca.  He comes today to be seen for dr. Rayann Heman.  He was last seen by him in 2016, at that time, there was discussion that the patient felt he was overmedicated and at his request tried on reduced dose of his ARB, c/o CP that had boith typical/atypical features, patient declined stress test 2/2 his age.  He saw pulm last month, moderate COPD, his inhalers adjusted, CT noted CAD and findings suggestive p.HTN and recommended cardiology f/u and echo.  The patient comes accompanied by his wife.  He reports for about 51mo-1year he has noted with the stairs in his home he feels more winded then he used to, recovers quickly within about 45 seconds.  He has not intolerances on flat ground, does not exercise, but walks comfortably through [employer], stores and so on.  He is active with woodworking and around the house without difficulty.  He denies any kind of CP, palpitations with the stairs or otherwise, no dizzy spells, near syncope or syncope.    The patient's wife concurs with his report though states he mentioned the same symptom to his PMD 3-4 years ago and was started on some inhalers back then that he used infrequently.  Since the change in his inhaler by the pulmonologist he thinks maybe slightly improved.  Past Medical History:  Diagnosis Date  . Arthritis    BACK  . Bifascicular block   . Bladder cancer ([place])   . BPH (benign prostatic hyperplasia)   . Chronic constipation   . Chronic restrictive lung disease   . COPD mixed type ([place])   . Coronary artery disease CARDIOLOGIST- DR Thompson Grayer   STRESS TEST 08-17-10 IN [facility]  (LOW RISK ,  NO ISCHEMIA)  . GERD (gastroesophageal reflux disease)   . History of basal cell carcinoma excision BILATERAL ARMS  . History of colon polyps   . Hx of thyroid cancer    s/p total thyroidectomy,  . Hyperlipidemia   . Hypertension   . Hypothyroidism   . Hypothyroidism, postsurgical   . Nocturia   . Right bundle branch block   . Sigmoid diverticulosis   . Wears glasses   . Wears glasses     Past Surgical History:  Procedure Laterality Date  . CARDIOVASCULAR STRESS TEST  08-17-2010   dr allred   Low risk nuclear study with prominent inferobasal thinning vs small prior infarct, no ischemia/  normal LV function and wall motion , ef 63%  . CATARACT EXTRACTION W/ INTRAOCULAR LENS  IMPLANT, BILATERAL    . CYSTOSCOPY W/ RETROGRADES Bilateral 10/08/2014   Procedure: CYSTOSCOPY WITH BILATERAL RETROGRADE PYELOGRAM, BIOPSY, FULGURATION;  Surgeon: Festus Aloe, MD;  [facility];  Service: Urology;  Laterality: Bilateral;  . CYSTOSCOPY W/ RETROGRADES Bilateral 06/18/2016   Procedure: CYSTOSCOPY WITH RETROGRADE PYELOGRAM;  Surgeon: Festus Aloe, MD;  Location: [facility];  Service: Urology;  Laterality: Bilateral;  . CYSTOSCOPY WITH BIOPSY  12/16/2010   Procedure: CYSTOSCOPY WITH BIOPSY;  Surgeon: Molli Hazard, MD;  Location: [facility];  Service: Urology;  Laterality: N/A;  . CYSTOSCOPY WITH  BIOPSY N/A 05/20/2015   Procedure: CYSTOSCOPY WITH BLADDER BIOPSY AND FULGERATION, CYSTOGRAM, BILATERAL RETROGRADES, BILATERAL RENAL WASHINGS;  Surgeon: Festus Aloe, MD;  Location: [facility];  Service: Urology;  Laterality: N/A;  . CYSTOSCOPY WITH BIOPSY N/A 06/18/2016   Procedure: CYSTOSCOPY WITH BIOPSY FULGURATION;  Surgeon: Festus Aloe, MD;  Location: [facility];  Service: Urology;  Laterality: N/A;  . CYSTOSCOPY/RETROGRADE/URETEROSCOPY  12/16/2010   Procedure: CYSTOSCOPY/RETROGRADE/URETEROSCOPY;  Surgeon: Molli Hazard, MD;  Location: [facility];  Service: Urology;  Laterality: Right;  bilateral retrograde, right ureteroscopy  . EYE SURGERY     attached lens to iris  . GREEN LIGHT LASER TURP (TRANSURETHRAL RESECTION OF PROSTATE  11-02-2011  . KNEE ARTHROSCOPY Left 11/  2016  . TOTAL THYROIDECTOMY  1991  . TRANSURETHRAL RESECTION OF BLADDER TUMOR  12/16/2010   Procedure: TRANSURETHRAL RESECTION OF BLADDER TUMOR (TURBT);  Surgeon: Molli Hazard, MD;  [facility];  Service: Urology;  Laterality: N/A;    Current Outpatient Prescriptions  Medication Sig Dispense Refill  . aspirin EC 81 MG tablet Take 81 mg by mouth daily.    [name] atorvastatin (LIPITOR) 20 MG tablet Take 20 mg by mouth daily after breakfast.     . fluticasone furoate-vilanterol (BREO ELLIPTA) 100-25 MCG/INH AEPB Inhale 1 puff into the lungs daily as needed (for respiratory issues.).    [name] levothyroxine (SYNTHROID, LEVOTHROID) 175 MCG tablet Take 175 mcg by mouth daily after breakfast.     . losartan (COZAAR) 50 MG tablet Take 1 tablet (50 mg total) by mouth daily. 90 tablet 3  . nitroGLYCERIN (NITROSTAT) 0.4 MG SL tablet Place 1 tablet (0.4 mg total) under the tongue every 5 (five) minutes as needed for chest pain. 30 tablet 12  . pantoprazole (PROTONIX) 40 MG tablet Take 1 tablet (40 mg total) by mouth daily. (Patient taking differently: Take 40 mg by mouth daily after breakfast. ) 60 tablet 0  . polyethylene glycol powder (GLYCOLAX/MIRALAX) powder Take 17 g by mouth daily as needed (for constipation).    [name] testosterone enanthate (DELATESTRYL) 200 MG/ML injection Inject 200 mg into the muscle every 28 (twenty-eight) days. For IM use only    . Tiotropium Bromide-Olodaterol (STIOLTO RESPIMAT) 2.5-2.5 MCG/ACT AERS Inhale 2 puffs into the lungs daily. 1 Inhaler 5  . triamterene-hydrochlorothiazide (MAXZIDE-25) 37.5-25 MG tablet Take 1 tablet by mouth daily.  0  . vitamin B-12 (CYANOCOBALAMIN) 1000  MCG tablet Take 1,000 mcg by mouth 3 (three) times a week.    . Vitamin D, Ergocalciferol, (DRISDOL) 50000 units CAPS capsule Take 50,000 Units by mouth every Monday.      No current facility-administered medications for this visit.     Allergies:   Patient has no known allergies.   Social History:  The patient  reports that he quit smoking about 34 years ago. His smoking use included Cigarettes. He has a 70.00 pack-year smoking history. He has never used smokeless tobacco. He reports that he drinks alcohol. He reports that he does not use drugs.   Family History:  The patient's family history includes Diabetes in his father; Lung cancer in his brother; Thyroid cancer in his unknown relative.  ROS:  Please see the history of present illness.    All other systems are reviewed and otherwise negative.   PHYSICAL EXAM:  VS:  BP 136/68   Pulse 72   Ht 6\' 4"  (1.93 m)   Wt 202 lb (91.6 kg)   BMI  24.59 kg/m  BMI: Body mass index is 24.59 kg/m. Well nourished, well developed, in no acute distress  HEENT: normocephalic, atraumatic  Neck: no JVD, carotid bruits or masses Cardiac: RRR; no significant murmurs, no rubs, or gallops Lungs:  CTA b/l, no wheezing, rhonchi or rales  Abd: soft, nontender MS: no deformity, age apporpriate atrophy Ext: trace edema b/l Skin: warm and dry, no rash Neuro:  No gross deficits appreciated Psych: euthymic mood, full affect   EKG:  Done 06/15/16 is SR 73bpm, RBBB, 1st degree AVblock, PR 247ms, QRS 143ms, QTc 448ms, appears essentially unchanged  Recent Labs: 06/15/2016: BUN 27; Creatinine, Ser 1.27; Hemoglobin 12.0; Platelets 446; Potassium 5.0; Sodium 141  No results found for requested labs within last 8760 hours.   CrCl cannot be calculated (Patient's most recent lab result is older than the maximum 21 days allowed.).   Wt Readings from Last 3 Encounters:  10/15/16 202 lb (91.6 kg)  09/30/16 200 lb (90.7 kg)  07/27/16 195 lb 6.4 oz (88.6 kg)      Other studies reviewed: Additional studies/records reviewed today include: summarized above  ASSESSMENT AND PLAN:  1. HTN     Looks OK, no changes today  2. HLD     Followed with PMD  3. SOB with stairs     Sounds like this symptom goes back 3-4 years, the patient thinks perhaps more so in the last year      CTA noted coronary atherosclerosis and findigs c/w p.HTN     He has no anginal sounding symptoms, will start with echo   4. Bifascicular block,    Unchanged    No brady symptoms    Disposition: F/u 6 weeks, sooner if needed, he will have had a couple months on new inhalers by then too fo rhis COPD   Current medicines are reviewed at length with the patient today.  The patient did not have any concerns regarding medicines.  Venetia Night, PA-C 10/15/2016 11:41 AM     [facility] [place] [phone] (office)  [phone] (fax)

## 2016-10-15 NOTE — Patient Instructions (Signed)
Medication Instructions:   Your physician recommends that you continue on your current medications as directed. Please refer to the Current Medication list given to you today.   If you need a refill on your cardiac medications before your next appointment, please call your pharmacy.  Labwork: NONE ORDERED  TODAY    Testing/Procedures: Your physician has requested that you have an echocardiogram. Echocardiography is a painless test that uses sound waves to create images of your heart. It provides your doctor with information about the size and shape of your heart and how well your heart's chambers and valves are working. This procedure takes approximately one hour. There are no restrictions for this procedure.     Follow-Up: 6 WEEKS WITH URSUY    Any Other Special Instructions Will Be Listed Below (If Applicable).

## 2016-10-15 NOTE — Addendum Note (Signed)
Addended by: Claude Manges on: 10/15/2016 11:49 AM   Modules accepted: Orders

## 2016-10-27 ENCOUNTER — Ambulatory Visit (HOSPITAL_COMMUNITY): Payer: Medicare Other | Attending: Cardiovascular Disease

## 2016-10-27 ENCOUNTER — Other Ambulatory Visit: Payer: Self-pay

## 2016-10-27 DIAGNOSIS — J449 Chronic obstructive pulmonary disease, unspecified: Secondary | ICD-10-CM | POA: Insufficient documentation

## 2016-10-27 DIAGNOSIS — R0989 Other specified symptoms and signs involving the circulatory and respiratory systems: Secondary | ICD-10-CM

## 2016-10-27 DIAGNOSIS — R0602 Shortness of breath: Secondary | ICD-10-CM | POA: Diagnosis not present

## 2016-10-27 DIAGNOSIS — I119 Hypertensive heart disease without heart failure: Secondary | ICD-10-CM | POA: Diagnosis not present

## 2016-10-27 DIAGNOSIS — Z87891 Personal history of nicotine dependence: Secondary | ICD-10-CM | POA: Diagnosis not present

## 2016-10-27 DIAGNOSIS — R0600 Dyspnea, unspecified: Secondary | ICD-10-CM | POA: Diagnosis present

## 2016-10-27 DIAGNOSIS — E785 Hyperlipidemia, unspecified: Secondary | ICD-10-CM | POA: Diagnosis not present

## 2016-10-27 DIAGNOSIS — I5189 Other ill-defined heart diseases: Secondary | ICD-10-CM

## 2016-10-27 HISTORY — DX: Other specified symptoms and signs involving the circulatory and respiratory systems: R09.89

## 2016-10-27 HISTORY — DX: Other ill-defined heart diseases: I51.89

## 2016-10-27 MED ORDER — PERFLUTREN LIPID MICROSPHERE
1.0000 mL | INTRAVENOUS | Status: AC | PRN
  Administered 2016-10-27: 2 mL via INTRAVENOUS

## 2016-10-28 ENCOUNTER — Telehealth: Payer: Self-pay | Admitting: Internal Medicine

## 2016-10-28 NOTE — Telephone Encounter (Signed)
Result Notes   Notes recorded by Claude Manges, CMA on 10/28/2016 at 9:30 AM EDT PT IS AWARE OF RESULTS AND APPT NEXT WEEK WITH PULMONOLOGIST ------  Notes recorded by Baldwin [place], PA-C on 10/27/2016 at 4:11 PM EDT Please let the patient know his heart muscle strength is strong, no heart murmurs of any concern. The echo confirmed elevated pulmonary pressures as suspected by Dr. Vaughan Browner (pulmonoloist). Please forward the echo to his office and remind the patient to follow up with Dr. Vaughan Browner as scheduled next week and [place] as well.  Thanks renee   Pts wife, on [place], was calling to receive pts echo results that were endorsed to him earlier this morning.  Went over the pts echo results/recommendations, per Tommye Standard PA-C, with the wife. Wife verbalized understanding and agrees with this plan.

## 2016-10-28 NOTE — Telephone Encounter (Signed)
Mrs.Ricciardi is calling because they have a question about the echocardiogram. Please call

## 2016-11-03 ENCOUNTER — Encounter: Payer: Self-pay | Admitting: Pulmonary Disease

## 2016-11-03 ENCOUNTER — Ambulatory Visit (INDEPENDENT_AMBULATORY_CARE_PROVIDER_SITE_OTHER): Payer: Medicare Other | Admitting: Pulmonary Disease

## 2016-11-03 VITALS — BP 110/68 | HR 88 | Ht 76.0 in | Wt 199.0 lb

## 2016-11-03 DIAGNOSIS — R0683 Snoring: Secondary | ICD-10-CM | POA: Diagnosis not present

## 2016-11-03 NOTE — Patient Instructions (Addendum)
We will schedule you for a home sleep study to check for sleep apnea or desaturations at night Stop using the inhaler as it is not helping.  Please let us know if there is any change in his symptoms We will schedule you for a follow-up CT of the chest without contrast 6 months for follow-up of lung nodules  Return after CT scan

## 2016-11-03 NOTE — Progress Notes (Signed)
GARET HOOTON Sr.    [id number]    19-Oct-1925  Primary Care Physician:[name], [name], MD  Referring Physician: [name], MD [address] [place], [place] [address]  Chief complaint:  Consult for management of dyspnea, COPD  HPI: 83 year old with history of COPD (CAT score 9), GERD, thyroid and bladder cancer. He was previously evaluated in the pulmonary clinic in 2014 for dyspnea. PFTs at that time showed moderate obstruction. He is currently on breo inhaler which was started 2 months ago. He feels this does not help much and makes his cough worse. His chief complaint today is dyspnea on exertion which has worsend over the last 2 months. He denies dyspnea at rest. No sputum production, fevers, chills.  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 Exposures: No known exposures to asbestos or any other exposures. Smoking history: 2 packs per day for 40 years. Quit in 1982.  Interim History:    Outpatient Encounter Prescriptions as of 11/03/2016  Medication Sig  . aspirin EC 81 MG tablet Take 81 mg by mouth daily.  [name] atorvastatin (LIPITOR) 20 MG tablet Take 20 mg by mouth daily after breakfast.   . levothyroxine (SYNTHROID, LEVOTHROID) 175 MCG tablet Take 175 mcg by mouth daily after breakfast.   . losartan (COZAAR) 50 MG tablet Take 1 tablet (50 mg total) by mouth daily.  . nitroGLYCERIN (NITROSTAT) 0.4 MG SL tablet Place 1 tablet (0.4 mg total) under the tongue every 5 (five) minutes as needed for chest pain.  . pantoprazole (PROTONIX) 40 MG tablet Take 1 tablet (40 mg total) by mouth daily. (Patient taking differently: Take 40 mg by mouth daily after breakfast. )  . polyethylene glycol powder (GLYCOLAX/MIRALAX) powder Take 17 g by mouth daily as needed (for constipation).  [name] testosterone enanthate (DELATESTRYL) 200 MG/ML injection Inject 200 mg into the muscle every 28 (twenty-eight) days. For IM  use only  . Tiotropium Bromide-Olodaterol (STIOLTO RESPIMAT) 2.5-2.5 MCG/ACT AERS Inhale 2 puffs into the lungs daily.  [name] triamterene-hydrochlorothiazide (MAXZIDE-25) 37.5-25 MG tablet Take 1 tablet by mouth daily.  . vitamin B-12 (CYANOCOBALAMIN) 1000 MCG tablet Take 1,000 mcg by mouth 3 (three) times a week.  . Vitamin D, Ergocalciferol, (DRISDOL) 50000 units CAPS capsule Take 50,000 Units by mouth every Monday.   . [DISCONTINUED] fluticasone furoate-vilanterol (BREO ELLIPTA) 100-25 MCG/INH AEPB Inhale 1 puff into the lungs daily as needed (for respiratory issues.).   No facility-administered encounter medications on file as of 11/03/2016.     Allergies as of 11/03/2016  . (No Known Allergies)    Past Medical History:  Diagnosis Date  . Arthritis    BACK  . Bifascicular block   . Bladder cancer ([place])   . BPH (benign prostatic hyperplasia)   . Chronic constipation   . Chronic restrictive lung disease   . COPD mixed type ([place])   . Coronary artery disease CARDIOLOGIST- DR Thompson Grayer   STRESS TEST 08-17-10 IN EPIC (LOW RISK ,  NO ISCHEMIA)  . GERD (gastroesophageal reflux disease)   . History of basal cell carcinoma excision BILATERAL ARMS  . History of colon polyps   . Hx of thyroid cancer    s/p total thyroidectomy,  . Hyperlipidemia   . Hypertension   . Hypothyroidism   . Hypothyroidism, postsurgical   . Nocturia   . Right bundle branch block   . Sigmoid diverticulosis   . Wears  glasses   . Wears glasses     Past Surgical History:  Procedure Laterality Date  . CARDIOVASCULAR STRESS TEST  08-17-2010   dr allred   Low risk nuclear study with prominent inferobasal thinning vs small prior infarct, no ischemia/  normal LV function and wall motion , ef 63%  . CATARACT EXTRACTION W/ INTRAOCULAR LENS  IMPLANT, BILATERAL    . CYSTOSCOPY W/ RETROGRADES Bilateral 10/08/2014   Procedure: CYSTOSCOPY WITH BILATERAL RETROGRADE PYELOGRAM, BIOPSY, FULGURATION;  Surgeon: Festus Aloe, MD;  Location: [facility];  Service: Urology;  Laterality: Bilateral;  . CYSTOSCOPY W/ RETROGRADES Bilateral 06/18/2016   Procedure: CYSTOSCOPY WITH RETROGRADE PYELOGRAM;  Surgeon: Festus Aloe, MD;  Location: [facility];  Service: Urology;  Laterality: Bilateral;  . CYSTOSCOPY WITH BIOPSY  12/16/2010   Procedure: CYSTOSCOPY WITH BIOPSY;  Surgeon: Molli Hazard, MD;  Location: [facility];  Service: Urology;  Laterality: N/A;  . CYSTOSCOPY WITH BIOPSY N/A 05/20/2015   Procedure: CYSTOSCOPY WITH BLADDER BIOPSY AND FULGERATION, CYSTOGRAM, BILATERAL RETROGRADES, BILATERAL RENAL WASHINGS;  Surgeon: Festus Aloe, MD;  Location: [facility];  Service: Urology;  Laterality: N/A;  . CYSTOSCOPY WITH BIOPSY N/A 06/18/2016   Procedure: CYSTOSCOPY WITH BIOPSY FULGURATION;  Surgeon: Festus Aloe, MD;  Location: [facility];  Service: Urology;  Laterality: N/A;  . CYSTOSCOPY/RETROGRADE/URETEROSCOPY  12/16/2010   Procedure: CYSTOSCOPY/RETROGRADE/URETEROSCOPY;  Surgeon: Molli Hazard, MD;  Location: [facility];  Service: Urology;  Laterality: Right;  bilateral retrograde, right ureteroscopy  . EYE SURGERY     attached lens to iris  . GREEN LIGHT LASER TURP (TRANSURETHRAL RESECTION OF PROSTATE  11-02-2011  . KNEE ARTHROSCOPY Left 11/  2016  . TOTAL THYROIDECTOMY  1991  . TRANSURETHRAL RESECTION OF BLADDER TUMOR  12/16/2010   Procedure: TRANSURETHRAL RESECTION OF BLADDER TUMOR (TURBT);  Surgeon: Molli Hazard, MD;  Location: [facility];  Service: Urology;  Laterality: N/A;    Family History  Problem Relation Age of Onset  . Lung cancer Brother   . Thyroid cancer Unknown   . Diabetes Father     Social History   Social History  . Marital status: Married    Spouse name: N/A  . Number of children: N/A  . Years of education: N/A   Occupational History  . retired    Social History Main Topics  .  Smoking status: Former Smoker    Packs/day: 2.00    Years: 35.00    Types: Cigarettes    Quit date: 01/11/1982  . Smokeless tobacco: Never Used  . Alcohol use 0.0 oz/week     Comment: 6-oz vodka per day   . Drug use: No  . Sexual activity: Not on file   Other Topics Concern  . Not on file   Social History Narrative   Lives in [place] with his spouse.   Retired from [employer].   Review of systems: Review of Systems  Constitutional: Negative for fever and chills.  HENT: Negative.   Eyes: Negative for blurred vision.  Respiratory: as per HPI  Cardiovascular: Negative for chest pain and palpitations.  Gastrointestinal: Negative for vomiting, diarrhea, blood per rectum. Genitourinary: Negative for dysuria, urgency, frequency and hematuria.  Musculoskeletal: Negative for myalgias, back pain and joint pain.  Skin: Negative for itching and rash.  Neurological: Negative for dizziness, tremors, focal weakness, seizures and loss of consciousness.  Endo/Heme/Allergies: Negative for environmental allergies.  Psychiatric/Behavioral: Negative for depression, suicidal ideas and hallucinations.  All other systems reviewed and are negative.  Physical Exam: There were no vitals taken for this visit. Gen:      No acute distress HEENT:  EOMI, sclera anicteric Neck:     No masses; no thyromegaly Lungs:    Clear to auscultation bilaterally; normal respiratory effort CV:         Regular rate and rhythm; no murmurs Abd:      + bowel sounds; soft, non-tender; no palpable masses, no distension Ext:    No edema; adequate peripheral perfusion Skin:      Warm and dry; no rash Neuro: alert and oriented x 3 Psych: normal mood and affect  Data Reviewed: PFTs 07/20/12 FVC 2.66 [52%], FEV1 1.85 (53%], F/F 69, TLC 72%, RV/TLC 134%, DLCO 28%. Moderate-severe obstruction, mild restriction with airtrapping, severe diffusion impairment.  PFTs 10/06/16 FVC 2.49 (53%], FEV1 1.91 (57%], F/F 76, TLC 73%, RV/TLC  133%, DLCO 36% Moderate obstructive airway disease, mild restriction with severe diffusion defect  CT abdomen 05/30/13-mild reticular opacities/scarring at the lung bases right greater than left Chest x-ray 05/02/15-hyperinflation, mild lung base scarring Chest x-ray 06/16/14-hyperinflation, no active cardiopulmonary disease. CT scan 10/07/16- no evidence of interstitial lung disease, 4 mm subpleural pulmonary nodule, right upper lobe 1.1 cm groundglass pulmonary nodule. Coronary atherosclerosis, dilated pulmonary artery I reviewed all images personally.  Echocardiogram 10/27/16 LVEF 02-58%, grade 1 diastolic dysfunction severe elevation and PA systolic pressure.  PA peak pressure of 61 RV cavity is mildly dilated, systolic function is normal.  Assessment:  Dyspnea Moderate COPD [name] has progressive dyspnea on exertion. Review of his PFTs show moderate-severe obstruction in 2014.  His repeat pulmonary function test shows stable lung function He has been tried on different inhalers including Breo and stiolto with no difference in symptoms.  He is asking if he can get off inhalers as they are not helping and are expensive.  Since he has not had any change in symptoms on inhalers I told him it is okay to stop the inhalers and monitor how he is doing.  Pulmonary HTN Likely combination of WHO class II and III COPD is stable and he does not have any significant desats on ambulation.  We will continue to monitor this I will schedule him for sleep study to check for presence of sleep apnea and nocturnal desaturations.  Restriction with diffusion impairment. No evidence of interstitial lung disease on CT   Pulmonary nodules Follow-up with repeat CT in 6 months.  Plan/Recommendations: - High res CT of the chest, PFTs - Stop breo, Start stioltio   Marshell Garfinkel MD [facility] Pager [phone] 11/03/2016, 10:13 AM  CC: [name], MD

## 2016-11-09 DIAGNOSIS — E298 Other testicular dysfunction: Secondary | ICD-10-CM | POA: Diagnosis not present

## 2016-11-19 DIAGNOSIS — E291 Testicular hypofunction: Secondary | ICD-10-CM | POA: Diagnosis not present

## 2016-11-19 DIAGNOSIS — E538 Deficiency of other specified B group vitamins: Secondary | ICD-10-CM | POA: Diagnosis not present

## 2016-11-19 DIAGNOSIS — R232 Flushing: Secondary | ICD-10-CM | POA: Diagnosis not present

## 2016-11-19 DIAGNOSIS — E038 Other specified hypothyroidism: Secondary | ICD-10-CM | POA: Diagnosis not present

## 2016-11-22 ENCOUNTER — Encounter: Payer: Self-pay | Admitting: Physician Assistant

## 2016-11-22 DIAGNOSIS — H1820 Unspecified corneal edema: Secondary | ICD-10-CM | POA: Diagnosis not present

## 2016-11-22 DIAGNOSIS — H04222 Epiphora due to insufficient drainage, left lacrimal gland: Secondary | ICD-10-CM | POA: Diagnosis not present

## 2016-11-22 DIAGNOSIS — E875 Hyperkalemia: Secondary | ICD-10-CM | POA: Diagnosis not present

## 2016-11-22 DIAGNOSIS — H11001 Unspecified pterygium of right eye: Secondary | ICD-10-CM | POA: Diagnosis not present

## 2016-11-25 ENCOUNTER — Telehealth: Payer: Self-pay | Admitting: Pulmonary Disease

## 2016-11-25 DIAGNOSIS — G4733 Obstructive sleep apnea (adult) (pediatric): Secondary | ICD-10-CM | POA: Diagnosis not present

## 2016-11-25 MED ORDER — TIOTROPIUM BROMIDE-OLODATEROL 2.5-2.5 MCG/ACT IN AERS: 2.0000 | INHALATION_SPRAY | Freq: Every day | RESPIRATORY_TRACT | 0 refills | Status: AC

## 2016-11-25 NOTE — Telephone Encounter (Signed)
Spoke with pt, who request sample of Stiolto. Pt has been provided with sample.  Pt states Stiolto medication is too expensive. I have advised pt to contact his insurance  for medication formulary.  Pt will call back once formulary is received. Nothing further needed.

## 2016-11-26 DIAGNOSIS — G4733 Obstructive sleep apnea (adult) (pediatric): Secondary | ICD-10-CM | POA: Diagnosis not present

## 2016-12-05 NOTE — Progress Notes (Signed)
Cardiology Office Note Date:  12/06/2016  Patient ID:  Jose BELLAND Sr., DOB 24-Mar-1925, MRN [id number] PCP:  [name], MD  Cardiologist:  Dr. Rayann Heman (last 2016 and prior to that 2012))    Chief Complaint:  SOB  History of Present Illness: Jose ARDOIN Sr. is a 83 y.o. male with history of COPD, HTN, HLD, hypothyroid (s/p thyroidectomy 2/2 ca), bladder ca.  He comes today to be seen for Dr. Rayann Heman.  He was last seen by him in 2016, at that time, there was discussion that the patient felt he was overmedicated and at his request tried on reduced dose of his ARB, c/o CP that had boith typical/atypical features, patient declined stress test 2/2 his age.  He saw pulm last month, moderate COPD, his inhalers adjusted, CT noted CAD and findings suggestive p.HTN and recommended cardiology f/u and echo.  The patient was last seen by myself 10/15/16, he came accompanied by his wife.  He reported for about 89mo-1year he had noted with the stairs in his home he feels more winded then he used to, recovers quickly within about 45 seconds.  He had no intolerances on flat ground, does not exercise, but reported able to walk comfortably through [employer], stores and so on.  He reported being active with woodworking and around the house without difficulty.  He denied any kind of CP, palpitations with the stairs or otherwise, no dizzy spells, near syncope or syncope.    The patient's wife concured with his report though states he mentioned the same symptom to his PMD 3-4 years ago and was started on some inhalers back then that he used infrequently. Since the change in his inhaler by the pulmonologist a few weeks prior he thought maybe felt slightly improved.  A CT done via pulmonary noted coronary atherosclerosis and findings suggestive of p.HTN, we started with an echo to evaluate given no clear anginal complaints and the patient's reluctance for stress testing historically given advanced age.  His  echo noted LVEF 60-65%, Grade I DD, no significant VHD, severely increased PA pressure, 64mmHg.  He saw Dr. Vaughan Browner post echo, noted no particular improvement in symptoms with inhalers and given cost burden OK's to stop them, described his COPD as moderated, and P.HTN as WHO class 2 and 3, and planned for sleep study.  He comes today again feeling well, the only thing is somewhat winded with a flight of stairs.  NO CP, palpitations, no SOB with flat ground, no exertional intolerances otherwise.  No dizziness, near syncope or syncope.  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  He has bad knees that keep him from doing to much other then regular walking.  He does not feel like he has any heart issues, and somewhat reluctant about the idea of starting any cardiac/ischemic work up.  Past Medical History:  Diagnosis Date  . Arthritis    BACK  . Bifascicular block   . Bladder cancer ([place])   . BPH (benign prostatic hyperplasia)   . Chronic constipation   . Chronic restrictive lung disease   . COPD mixed type ([place])   . Coronary artery disease CARDIOLOGIST- DR Thompson Grayer   STRESS TEST 08-17-10 IN [facility] (LOW RISK ,  NO ISCHEMIA)  . GERD (gastroesophageal reflux disease)   . History of basal cell carcinoma excision BILATERAL ARMS  . History of colon polyps   . Hx of thyroid cancer    s/p total thyroidectomy,  .  Hyperlipidemia   . Hypertension   . Hypothyroidism   . Hypothyroidism, postsurgical   . Nocturia   . Right bundle branch block   . Sigmoid diverticulosis   . Wears glasses   . Wears glasses     Past Surgical History:  Procedure Laterality Date  . CARDIOVASCULAR STRESS TEST  08-17-2010   dr allred   Low risk nuclear study with prominent inferobasal thinning vs small prior infarct, no ischemia/  normal LV function and wall motion , ef 63%  . CATARACT EXTRACTION W/ INTRAOCULAR LENS  IMPLANT, BILATERAL    . CYSTOSCOPY W/ RETROGRADES Bilateral 10/08/2014   Procedure:  CYSTOSCOPY WITH BILATERAL RETROGRADE PYELOGRAM, BIOPSY, FULGURATION;  Surgeon: Festus Aloe, MD;  [facility];  Service: Urology;  Laterality: Bilateral;  . CYSTOSCOPY W/ RETROGRADES Bilateral 06/18/2016   Procedure: CYSTOSCOPY WITH RETROGRADE PYELOGRAM;  Surgeon: Festus Aloe, MD;  Location: [facility];  Service: Urology;  Laterality: Bilateral;  . CYSTOSCOPY WITH BIOPSY  12/16/2010   Procedure: CYSTOSCOPY WITH BIOPSY;  Surgeon: Molli Hazard, MD;  Location: [facility];  Service: Urology;  Laterality: N/A;  . CYSTOSCOPY WITH BIOPSY N/A 05/20/2015   Procedure: CYSTOSCOPY WITH BLADDER BIOPSY AND FULGERATION, CYSTOGRAM, BILATERAL RETROGRADES, BILATERAL RENAL WASHINGS;  Surgeon: Festus Aloe, MD;  Location: [facility];  Service: Urology;  Laterality: N/A;  . CYSTOSCOPY WITH BIOPSY N/A 06/18/2016   Procedure: CYSTOSCOPY WITH BIOPSY FULGURATION;  Surgeon: Festus Aloe, MD;  Location: [facility];  Service: Urology;  Laterality: N/A;  . CYSTOSCOPY/RETROGRADE/URETEROSCOPY  12/16/2010   Procedure: CYSTOSCOPY/RETROGRADE/URETEROSCOPY;  Surgeon: Molli Hazard, MD;  Location: [facility];  Service: Urology;  Laterality: Right;  bilateral retrograde, right ureteroscopy  . EYE SURGERY     attached lens to iris  . GREEN LIGHT LASER TURP (TRANSURETHRAL RESECTION OF PROSTATE  11-02-2011  . KNEE ARTHROSCOPY Left 11/  2016  . TOTAL THYROIDECTOMY  1991  . TRANSURETHRAL RESECTION OF BLADDER TUMOR  12/16/2010   Procedure: TRANSURETHRAL RESECTION OF BLADDER TUMOR (TURBT);  Surgeon: Molli Hazard, MD;  Location: [facility];  Service: Urology;  Laterality: N/A;    Current Outpatient Medications  Medication Sig Dispense Refill  . aspirin EC 81 MG tablet Take 81 mg by mouth daily.    [name] atorvastatin (LIPITOR) 20 MG tablet Take 20 mg by mouth daily after breakfast.     . levothyroxine (SYNTHROID, LEVOTHROID)  175 MCG tablet Take 175 mcg by mouth daily after breakfast.     . losartan (COZAAR) 50 MG tablet Take 1 tablet (50 mg total) by mouth daily. 90 tablet 3  . nitroGLYCERIN (NITROSTAT) 0.4 MG SL tablet Place 1 tablet (0.4 mg total) under the tongue every 5 (five) minutes as needed for chest pain. 30 tablet 12  . pantoprazole (PROTONIX) 40 MG tablet Take 1 tablet (40 mg total) by mouth daily. (Patient taking differently: Take 40 mg by mouth daily after breakfast. ) 60 tablet 0  . polyethylene glycol powder (GLYCOLAX/MIRALAX) powder Take 17 g by mouth daily as needed (for constipation).    [name] testosterone enanthate (DELATESTRYL) 200 MG/ML injection Inject 200 mg into the muscle every 28 (twenty-eight) days. For IM use only    . Tiotropium Bromide-Olodaterol (STIOLTO RESPIMAT) 2.5-2.5 MCG/ACT AERS Inhale 2 puffs into the lungs daily. 1 Inhaler 5  . triamterene-hydrochlorothiazide (MAXZIDE-25) 37.5-25 MG tablet Take 1 tablet by mouth daily.  0  . vitamin B-12 (CYANOCOBALAMIN) 1000 MCG tablet Take 1,000 mcg  by mouth 3 (three) times a week.    . Vitamin D, Ergocalciferol, (DRISDOL) 50000 units CAPS capsule Take 50,000 Units by mouth every Monday.      No current facility-administered medications for this visit.     Allergies:   Patient has no known allergies.   Social History:  The patient  reports that he quit smoking about 34 years ago. His smoking use included cigarettes. He has a 70.00 pack-year smoking history. he has never used smokeless tobacco. He reports that he drinks alcohol. He reports that he does not use drugs.   Family History:  The patient's family history includes Diabetes in his father; Lung cancer in his brother; Thyroid cancer in his unknown relative.  ROS:  Please see the history of present illness.    All other systems are reviewed and otherwise negative.   PHYSICAL EXAM:  VS:  BP 138/61   Pulse 88   Ht 6\' 4"  (1.93 m)   Wt 202 lb (91.6 kg)   BMI 24.59 kg/m  BMI: Body mass  index is 24.59 kg/m. Well nourished, well developed, in no acute distress  HEENT: normocephalic, atraumatic  Neck: no JVD, carotid bruits or masses Cardiac: RRR; no significant murmurs, no rubs, or gallops Lungs:  CTA b/l, no wheezing, rhonchi or rales  Abd: soft, nontender MS: no deformity, age apporpriate atrophy Ext: trace edema b/l (patient reports chronically by mid-day, gone in AM) Skin: warm and dry, no rash Neuro:  No gross deficits appreciated Psych: euthymic mood, full affect   EKG:  Done 06/15/16 is SR 73bpm, RBBB, 1st degree AVblock, PR 238ms, QRS 162ms, QTc 465ms, appears essentially unchanged  10/27/16: TTE Study Conclusions - Procedure narrative: Transthoracic echocardiography. Image   quality was adequate. The study was technically difficult.   Intravenous contrast (Definity) was administered. - Left ventricle: The cavity size was normal. There was mild focal   basal hypertrophy of the septum. Systolic function was normal.   The estimated ejection fraction was in the range of 60% to 65%.   Wall motion was normal; there were no regional wall motion   abnormalities. Doppler parameters are consistent with abnormal   left ventricular relaxation (grade 1 diastolic dysfunction).   Doppler parameters are consistent with indeterminate ventricular   filling pressure. - Ventricular septum: The contour showed systolic flattening. - Aortic valve: Transvalvular velocity was within the normal range.   There was no stenosis. There was no regurgitation. - Mitral valve: Transvalvular velocity was within the normal range.   There was no evidence for stenosis. There was trivial   regurgitation. - Right ventricle: The cavity size was mildly dilated. Wall   thickness was normal. Systolic function was normal. - Tricuspid valve: There was trivial regurgitation. - Pulmonary arteries: Systolic pressure was severely increased. PA   peak pressure: 61 mm Hg (S).  Recent Labs: 06/15/2016:  BUN 27; Creatinine, Ser 1.27; Hemoglobin 12.0; Platelets 446; Potassium 5.0; Sodium 141  No results found for requested labs within last 8760 hours.   CrCl cannot be calculated (Patient's most recent lab result is older than the maximum 21 days allowed.).   Wt Readings from Last 3 Encounters:  12/06/16 202 lb (91.6 kg)  11/03/16 199 lb (90.3 kg)  10/15/16 202 lb (91.6 kg)     Other studies reviewed: Additional studies/records reviewed today include: summarized above  ASSESSMENT AND PLAN:  1. HTN     Looks OK, no changes today  2. HLD     Followed/managed  with PMD  3. SOB with stairs     Sounds like this symptom goes back 3-4 years, the patient thinks perhaps more so in the last year      CTA noted coronary atherosclerosis and findigs c/w p.HTN     Echo noted normal LVEF, no WMA.  Discussed atherosclerosis on CT scan, no symptoms that sound anginal.  Discussed we could do stress testing to evaluate further, though echo was encouraging with no WMA and normal LVEF.  At this time, was decided to hold off ischemic evaluation.    4. Bifascicular block,    Unchanged    No brady symptoms    Disposition: We will see him annually, sooner if needed.     Current medicines are reviewed at length with the patient today.  The patient did not have any concerns regarding medicines.  Venetia Night, PA-C 12/06/2016 1:38 PM     [address] [place] [address] [phone] (office)  [phone] (fax)

## 2016-12-06 ENCOUNTER — Telehealth: Payer: Self-pay | Admitting: Pulmonary Disease

## 2016-12-06 ENCOUNTER — Ambulatory Visit: Payer: Medicare Other | Admitting: Physician Assistant

## 2016-12-06 VITALS — BP 138/61 | HR 88 | Ht 76.0 in | Wt 202.0 lb

## 2016-12-06 DIAGNOSIS — I10 Essential (primary) hypertension: Secondary | ICD-10-CM | POA: Diagnosis not present

## 2016-12-06 DIAGNOSIS — D485 Neoplasm of uncertain behavior of skin: Secondary | ICD-10-CM | POA: Diagnosis not present

## 2016-12-06 DIAGNOSIS — L570 Actinic keratosis: Secondary | ICD-10-CM | POA: Diagnosis not present

## 2016-12-06 DIAGNOSIS — D0461 Carcinoma in situ of skin of right upper limb, including shoulder: Secondary | ICD-10-CM | POA: Diagnosis not present

## 2016-12-06 DIAGNOSIS — Z85828 Personal history of other malignant neoplasm of skin: Secondary | ICD-10-CM | POA: Diagnosis not present

## 2016-12-06 DIAGNOSIS — E7849 Other hyperlipidemia: Secondary | ICD-10-CM | POA: Diagnosis not present

## 2016-12-06 DIAGNOSIS — L565 Disseminated superficial actinic porokeratosis (DSAP): Secondary | ICD-10-CM | POA: Diagnosis not present

## 2016-12-06 DIAGNOSIS — G4733 Obstructive sleep apnea (adult) (pediatric): Secondary | ICD-10-CM

## 2016-12-06 DIAGNOSIS — I452 Bifascicular block: Secondary | ICD-10-CM

## 2016-12-06 DIAGNOSIS — C44319 Basal cell carcinoma of skin of other parts of face: Secondary | ICD-10-CM | POA: Diagnosis not present

## 2016-12-06 NOTE — Telephone Encounter (Signed)
Rodena Piety, has the HST been scanned into the chart?  I could not locate it.

## 2016-12-06 NOTE — Patient Instructions (Signed)
Medication Instructions:   Your physician recommends that you continue on your current medications as directed. Please refer to the Current Medication list given to you today.   If you need a refill on your cardiac medications before your next appointment, please call your pharmacy.  Labwork:  NONE ORDERED  TODAY    Testing/Procedures: NONE ORDERED  TODAY    Follow-Up: Your physician wants you to follow-up in: [place] will receive a reminder letter in the mail two months in advance. If you don't receive a letter, please call our office to schedule the follow-up appointment.  CONTACT [facility] [phone] AS NEEDED FOR  ANY CARDIAC RELATED SYMPTOMS    Any Other Special Instructions Will Be Listed Below (If Applicable).

## 2016-12-07 ENCOUNTER — Ambulatory Visit: Payer: Medicare Other | Admitting: Physician Assistant

## 2016-12-07 NOTE — Telephone Encounter (Signed)
Pt is requesting sleep study results.  I have placed sleep study results in PM's cubby for review, as results are not scanned in.  Will route to PM to make aware.

## 2016-12-07 NOTE — Telephone Encounter (Signed)
It has been read but it has not been billed and sent over to be scanned in yet. I have a copy I can give you if you want

## 2016-12-08 DIAGNOSIS — E298 Other testicular dysfunction: Secondary | ICD-10-CM | POA: Diagnosis not present

## 2016-12-10 ENCOUNTER — Other Ambulatory Visit: Payer: Self-pay | Admitting: *Deleted

## 2016-12-10 DIAGNOSIS — R0683 Snoring: Secondary | ICD-10-CM

## 2016-12-10 NOTE — Telephone Encounter (Signed)
I received the HST

## 2016-12-13 DIAGNOSIS — E538 Deficiency of other specified B group vitamins: Secondary | ICD-10-CM | POA: Diagnosis not present

## 2016-12-13 DIAGNOSIS — I10 Essential (primary) hypertension: Secondary | ICD-10-CM | POA: Diagnosis not present

## 2016-12-13 DIAGNOSIS — E7849 Other hyperlipidemia: Secondary | ICD-10-CM | POA: Diagnosis not present

## 2016-12-13 DIAGNOSIS — E038 Other specified hypothyroidism: Secondary | ICD-10-CM | POA: Diagnosis not present

## 2016-12-13 DIAGNOSIS — R82998 Other abnormal findings in urine: Secondary | ICD-10-CM | POA: Diagnosis not present

## 2016-12-13 NOTE — Telephone Encounter (Signed)
Verified HST is in PM's lookat folder Called spoke with patient to let him know that the results have been received Patient is agitated and would like a call back today - apologized for any delay and inconvenience Because of this, will flag message as urgent  PM please advise, thank you

## 2016-12-13 NOTE — Telephone Encounter (Signed)
Pt is aware of below message and voiced his understanding.  Titration has been placed. Nothing further needed.

## 2016-12-13 NOTE — Telephone Encounter (Signed)
Study reviewed. It confirms severe sleep apnea with low o2 levels. Please schedule for CPAP titration study. Thanks.  Apologize for the delay as I was not in clinic last week to review the study  Marshell Garfinkel MD [facility] Pager [phone] If no answer or after 3pm call: [phone] 12/13/2016, 11:04 AM

## 2016-12-17 DIAGNOSIS — H04222 Epiphora due to insufficient drainage, left lacrimal gland: Secondary | ICD-10-CM | POA: Diagnosis not present

## 2016-12-23 ENCOUNTER — Telehealth: Payer: Self-pay | Admitting: Pulmonary Disease

## 2016-12-24 NOTE — Telephone Encounter (Signed)
Left detailed message advising pt he did not need precert for the test. Nothing further is needed.

## 2016-12-24 NOTE — Telephone Encounter (Signed)
I checked this and his ins [employer] does not require a precert for this sleep study Joellen Jersey

## 2016-12-24 NOTE — Telephone Encounter (Signed)
Pt is scheduled for cpap titration on 01/16/17. Libby please advise if this has been precerted.  Thanks!   (please leave detailed message on pt's machine if he does not answer to let him know.)

## 2016-12-30 DIAGNOSIS — C73 Malignant neoplasm of thyroid gland: Secondary | ICD-10-CM | POA: Diagnosis not present

## 2016-12-30 DIAGNOSIS — I10 Essential (primary) hypertension: Secondary | ICD-10-CM | POA: Diagnosis not present

## 2016-12-30 DIAGNOSIS — R600 Localized edema: Secondary | ICD-10-CM | POA: Diagnosis not present

## 2016-12-30 DIAGNOSIS — Z0000 Encounter for general adult medical examination without abnormal findings: Secondary | ICD-10-CM | POA: Diagnosis not present

## 2017-01-06 DIAGNOSIS — C672 Malignant neoplasm of lateral wall of bladder: Secondary | ICD-10-CM | POA: Diagnosis not present

## 2017-01-06 DIAGNOSIS — Z1212 Encounter for screening for malignant neoplasm of rectum: Secondary | ICD-10-CM | POA: Diagnosis not present

## 2017-01-09 ENCOUNTER — Inpatient Hospital Stay (HOSPITAL_COMMUNITY)
Admission: EM | Admit: 2017-01-09 | Discharge: 2017-01-15 | DRG: 871 | Disposition: A | Discharge status: Home Health | Payer: Medicare Other | Attending: Family Medicine | Admitting: Family Medicine

## 2017-01-09 ENCOUNTER — Emergency Department (HOSPITAL_COMMUNITY): Payer: Medicare Other

## 2017-01-09 ENCOUNTER — Other Ambulatory Visit: Payer: Self-pay

## 2017-01-09 ENCOUNTER — Encounter (HOSPITAL_COMMUNITY): Payer: Self-pay

## 2017-01-09 ENCOUNTER — Telehealth: Payer: Self-pay | Admitting: Pulmonary Disease

## 2017-01-09 DIAGNOSIS — R6510 Systemic inflammatory response syndrome (SIRS) of non-infectious origin without acute organ dysfunction: Secondary | ICD-10-CM | POA: Diagnosis present

## 2017-01-09 DIAGNOSIS — J441 Chronic obstructive pulmonary disease with (acute) exacerbation: Secondary | ICD-10-CM | POA: Diagnosis present

## 2017-01-09 DIAGNOSIS — R0602 Shortness of breath: Secondary | ICD-10-CM | POA: Diagnosis not present

## 2017-01-09 DIAGNOSIS — K219 Gastro-esophageal reflux disease without esophagitis: Secondary | ICD-10-CM | POA: Diagnosis present

## 2017-01-09 DIAGNOSIS — N183 Chronic kidney disease, stage 3 (moderate): Secondary | ICD-10-CM | POA: Diagnosis not present

## 2017-01-09 DIAGNOSIS — Z8551 Personal history of malignant neoplasm of bladder: Secondary | ICD-10-CM | POA: Diagnosis not present

## 2017-01-09 DIAGNOSIS — E039 Hypothyroidism, unspecified: Secondary | ICD-10-CM | POA: Diagnosis present

## 2017-01-09 DIAGNOSIS — R7989 Other specified abnormal findings of blood chemistry: Secondary | ICD-10-CM | POA: Diagnosis not present

## 2017-01-09 DIAGNOSIS — I10 Essential (primary) hypertension: Secondary | ICD-10-CM | POA: Diagnosis present

## 2017-01-09 DIAGNOSIS — N179 Acute kidney failure, unspecified: Secondary | ICD-10-CM | POA: Diagnosis not present

## 2017-01-09 DIAGNOSIS — E890 Postprocedural hypothyroidism: Secondary | ICD-10-CM | POA: Diagnosis present

## 2017-01-09 DIAGNOSIS — E872 Acidosis: Secondary | ICD-10-CM | POA: Diagnosis present

## 2017-01-09 DIAGNOSIS — K5730 Diverticulosis of large intestine without perforation or abscess without bleeding: Secondary | ICD-10-CM | POA: Diagnosis present

## 2017-01-09 DIAGNOSIS — E785 Hyperlipidemia, unspecified: Secondary | ICD-10-CM | POA: Diagnosis present

## 2017-01-09 DIAGNOSIS — R6520 Severe sepsis without septic shock: Secondary | ICD-10-CM | POA: Diagnosis present

## 2017-01-09 DIAGNOSIS — J90 Pleural effusion, not elsewhere classified: Secondary | ICD-10-CM | POA: Diagnosis not present

## 2017-01-09 DIAGNOSIS — I5032 Chronic diastolic (congestive) heart failure: Secondary | ICD-10-CM | POA: Diagnosis present

## 2017-01-09 DIAGNOSIS — R319 Hematuria, unspecified: Secondary | ICD-10-CM

## 2017-01-09 DIAGNOSIS — G629 Polyneuropathy, unspecified: Secondary | ICD-10-CM | POA: Diagnosis present

## 2017-01-09 DIAGNOSIS — I2510 Atherosclerotic heart disease of native coronary artery without angina pectoris: Secondary | ICD-10-CM | POA: Diagnosis not present

## 2017-01-09 DIAGNOSIS — Z87891 Personal history of nicotine dependence: Secondary | ICD-10-CM

## 2017-01-09 DIAGNOSIS — Z85850 Personal history of malignant neoplasm of thyroid: Secondary | ICD-10-CM | POA: Diagnosis not present

## 2017-01-09 DIAGNOSIS — N189 Chronic kidney disease, unspecified: Secondary | ICD-10-CM

## 2017-01-09 DIAGNOSIS — J9601 Acute respiratory failure with hypoxia: Secondary | ICD-10-CM | POA: Diagnosis not present

## 2017-01-09 DIAGNOSIS — J9621 Acute and chronic respiratory failure with hypoxia: Secondary | ICD-10-CM | POA: Diagnosis not present

## 2017-01-09 DIAGNOSIS — J449 Chronic obstructive pulmonary disease, unspecified: Secondary | ICD-10-CM | POA: Diagnosis not present

## 2017-01-09 DIAGNOSIS — N400 Enlarged prostate without lower urinary tract symptoms: Secondary | ICD-10-CM | POA: Diagnosis present

## 2017-01-09 DIAGNOSIS — A419 Sepsis, unspecified organism: Secondary | ICD-10-CM | POA: Diagnosis not present

## 2017-01-09 DIAGNOSIS — Z85828 Personal history of other malignant neoplasm of skin: Secondary | ICD-10-CM

## 2017-01-09 DIAGNOSIS — C679 Malignant neoplasm of bladder, unspecified: Secondary | ICD-10-CM | POA: Diagnosis not present

## 2017-01-09 DIAGNOSIS — E871 Hypo-osmolality and hyponatremia: Secondary | ICD-10-CM | POA: Diagnosis present

## 2017-01-09 DIAGNOSIS — I2609 Other pulmonary embolism with acute cor pulmonale: Secondary | ICD-10-CM | POA: Diagnosis not present

## 2017-01-09 DIAGNOSIS — N390 Urinary tract infection, site not specified: Secondary | ICD-10-CM | POA: Diagnosis not present

## 2017-01-09 DIAGNOSIS — I272 Pulmonary hypertension, unspecified: Secondary | ICD-10-CM | POA: Diagnosis present

## 2017-01-09 DIAGNOSIS — Z79899 Other long term (current) drug therapy: Secondary | ICD-10-CM

## 2017-01-09 DIAGNOSIS — I130 Hypertensive heart and chronic kidney disease with heart failure and stage 1 through stage 4 chronic kidney disease, or unspecified chronic kidney disease: Secondary | ICD-10-CM | POA: Diagnosis present

## 2017-01-09 DIAGNOSIS — G4733 Obstructive sleep apnea (adult) (pediatric): Secondary | ICD-10-CM | POA: Diagnosis not present

## 2017-01-09 DIAGNOSIS — Z7982 Long term (current) use of aspirin: Secondary | ICD-10-CM

## 2017-01-09 DIAGNOSIS — I2781 Cor pulmonale (chronic): Secondary | ICD-10-CM | POA: Diagnosis not present

## 2017-01-09 DIAGNOSIS — R609 Edema, unspecified: Secondary | ICD-10-CM | POA: Diagnosis not present

## 2017-01-09 LAB — URINALYSIS, ROUTINE W REFLEX MICROSCOPIC
BILIRUBIN URINE: NEGATIVE
Glucose, UA: NEGATIVE mg/dL
KETONES UR: NEGATIVE mg/dL
Nitrite: NEGATIVE
Protein, ur: 100 mg/dL — AB
SPECIFIC GRAVITY, URINE: 1.015 (ref 1.005–1.030)
SQUAMOUS EPITHELIAL / LPF: NONE SEEN
pH: 5 (ref 5.0–8.0)

## 2017-01-09 LAB — COMPREHENSIVE METABOLIC PANEL
ALK PHOS: 72 U/L (ref 38–126)
ALT: 16 U/L — AB (ref 17–63)
ANION GAP: 11 (ref 5–15)
AST: 21 U/L (ref 15–41)
Albumin: 3.6 g/dL (ref 3.5–5.0)
BILIRUBIN TOTAL: 1.3 mg/dL — AB (ref 0.3–1.2)
BUN: 56 mg/dL — ABNORMAL HIGH (ref 6–20)
CALCIUM: 8.8 mg/dL — AB (ref 8.9–10.3)
CO2: 22 mmol/L (ref 22–32)
CREATININE: 2.09 mg/dL — AB (ref 0.61–1.24)
Chloride: 97 mmol/L — ABNORMAL LOW (ref 101–111)
GFR, EST AFRICAN AMERICAN: 31 mL/min — AB (ref >60)
GFR, EST NON AFRICAN AMERICAN: 26 mL/min — AB (ref >60)
Glucose, Bld: 113 mg/dL — ABNORMAL HIGH (ref 65–99)
Potassium: 4.4 mmol/L (ref 3.5–5.1)
Sodium: 130 mmol/L — ABNORMAL LOW (ref 135–145)
TOTAL PROTEIN: 7 g/dL (ref 6.5–8.1)

## 2017-01-09 LAB — CBC WITH DIFFERENTIAL/PLATELET
BASOS ABS: 0 10*3/uL (ref 0.0–0.1)
BASOS PCT: 0 %
EOS ABS: 0 10*3/uL (ref 0.0–0.7)
Eosinophils Relative: 0 %
HCT: 33.3 % — ABNORMAL LOW (ref 39.0–52.0)
Hemoglobin: 10.2 g/dL — ABNORMAL LOW (ref 13.0–17.0)
Lymphocytes Relative: 2 %
Lymphs Abs: 0.6 10*3/uL — ABNORMAL LOW (ref 0.7–4.0)
MCH: 22 pg — AB (ref 26.0–34.0)
MCHC: 30.6 g/dL (ref 30.0–36.0)
MCV: 71.9 fL — ABNORMAL LOW (ref 78.0–100.0)
MONO ABS: 2.2 10*3/uL — AB (ref 0.1–1.0)
Monocytes Relative: 7 %
NEUTROS PCT: 91 %
Neutro Abs: 28.4 10*3/uL — ABNORMAL HIGH (ref 1.7–7.7)
PLATELETS: 229 10*3/uL (ref 150–400)
RBC: 4.63 MIL/uL (ref 4.22–5.81)
RDW: 19.6 % — ABNORMAL HIGH (ref 11.5–15.5)
WBC: 31.2 10*3/uL — AB (ref 4.0–10.5)

## 2017-01-09 LAB — INFLUENZA PANEL BY PCR (TYPE A & B)
INFLAPCR: NEGATIVE
Influenza B By PCR: NEGATIVE

## 2017-01-09 LAB — I-STAT TROPONIN, ED: TROPONIN I, POC: 0.08 ng/mL (ref 0.00–0.08)

## 2017-01-09 LAB — PHOSPHORUS: PHOSPHORUS: 2.9 mg/dL (ref 2.5–4.6)

## 2017-01-09 LAB — I-STAT CG4 LACTIC ACID, ED
LACTIC ACID, VENOUS: 1.4 mmol/L (ref 0.5–1.9)
Lactic Acid, Venous: 2.71 mmol/L (ref 0.5–1.9)

## 2017-01-09 LAB — TSH: TSH: 2.347 u[IU]/mL (ref 0.350–4.500)

## 2017-01-09 LAB — MAGNESIUM: MAGNESIUM: 1.7 mg/dL (ref 1.7–2.4)

## 2017-01-09 MED ORDER — ACETAMINOPHEN 325 MG PO TABS: 650.0000 mg | ORAL_TABLET | Freq: Four times a day (QID) | ORAL | Status: DC | PRN

## 2017-01-09 MED ORDER — ACETAMINOPHEN 325 MG PO TABS
650.0000 mg | ORAL_TABLET | Freq: Four times a day (QID) | ORAL | Status: DC | PRN
  Administered 2017-01-09: 650 mg via ORAL
  Filled 2017-01-09: qty 2

## 2017-01-09 MED ORDER — VITAMIN B-12 1000 MCG PO TABS
1000.0000 ug | ORAL_TABLET | ORAL | Status: DC
  Administered 2017-01-12 – 2017-01-14 (×2): 1000 ug via ORAL
  Filled 2017-01-09 (×2): qty 1

## 2017-01-09 MED ORDER — ONDANSETRON HCL 4 MG PO TABS: 4.0000 mg | ORAL_TABLET | Freq: Four times a day (QID) | ORAL | Status: DC | PRN

## 2017-01-09 MED ORDER — DEXTROSE 5 % IV SOLN
1.0000 g | Freq: Once | INTRAVENOUS | Status: AC
  Administered 2017-01-09: 1 g via INTRAVENOUS
  Filled 2017-01-09: qty 10

## 2017-01-09 MED ORDER — ACETAMINOPHEN 325 MG PO TABS
650.0000 mg | ORAL_TABLET | Freq: Once | ORAL | Status: AC
  Administered 2017-01-09: 650 mg via ORAL
  Filled 2017-01-09: qty 2

## 2017-01-09 MED ORDER — HYDROCODONE-ACETAMINOPHEN 5-325 MG PO TABS
1.0000 | ORAL_TABLET | Freq: Four times a day (QID) | ORAL | Status: DC | PRN
  Administered 2017-01-09 – 2017-01-10 (×2): 1 via ORAL
  Filled 2017-01-09 (×2): qty 1

## 2017-01-09 MED ORDER — HEPARIN SODIUM (PORCINE) 5000 UNIT/ML IJ SOLN
5000.0000 [IU] | Freq: Three times a day (TID) | INTRAMUSCULAR | Status: DC
  Administered 2017-01-09 – 2017-01-11 (×7): 5000 [IU] via SUBCUTANEOUS
  Filled 2017-01-09 (×7): qty 1

## 2017-01-09 MED ORDER — LEVOTHYROXINE SODIUM 50 MCG PO TABS
175.0000 ug | ORAL_TABLET | Freq: Every day | ORAL | Status: DC
  Administered 2017-01-10 – 2017-01-15 (×6): 175 ug via ORAL
  Filled 2017-01-09 (×6): qty 1

## 2017-01-09 MED ORDER — POLYSACCHARIDE IRON COMPLEX 150 MG PO CAPS
150.0000 mg | ORAL_CAPSULE | Freq: Every day | ORAL | Status: DC
  Administered 2017-01-09 – 2017-01-15 (×7): 150 mg via ORAL
  Filled 2017-01-09 (×7): qty 1

## 2017-01-09 MED ORDER — SODIUM CHLORIDE 0.9 % IV BOLUS (SEPSIS)
1000.0000 mL | Freq: Once | INTRAVENOUS | Status: AC
  Administered 2017-01-09: 1000 mL via INTRAVENOUS

## 2017-01-09 MED ORDER — ONDANSETRON HCL 4 MG/2ML IJ SOLN: 4.0000 mg | Freq: Four times a day (QID) | INTRAMUSCULAR | Status: DC | PRN

## 2017-01-09 MED ORDER — BUDESONIDE 0.5 MG/2ML IN SUSP
0.5000 mg | Freq: Two times a day (BID) | RESPIRATORY_TRACT | Status: DC
  Administered 2017-01-09 – 2017-01-15 (×12): 0.5 mg via RESPIRATORY_TRACT
  Filled 2017-01-09 (×11): qty 2

## 2017-01-09 MED ORDER — POLYETHYLENE GLYCOL 3350 17 GM/SCOOP PO POWD: 17.0000 g | Freq: Every day | ORAL | Status: DC | PRN

## 2017-01-09 MED ORDER — CEFTRIAXONE SODIUM 1 G IJ SOLR
1.0000 g | INTRAMUSCULAR | Status: DC
  Administered 2017-01-10 – 2017-01-14 (×5): 1 g via INTRAVENOUS
  Filled 2017-01-09 (×5): qty 10

## 2017-01-09 MED ORDER — PANTOPRAZOLE SODIUM 40 MG PO TBEC
40.0000 mg | DELAYED_RELEASE_TABLET | Freq: Every day | ORAL | Status: DC
  Administered 2017-01-09 – 2017-01-15 (×7): 40 mg via ORAL
  Filled 2017-01-09 (×7): qty 1

## 2017-01-09 MED ORDER — DEXTROSE 5 % IV SOLN
500.0000 mg | INTRAVENOUS | Status: DC
  Administered 2017-01-10: 500 mg via INTRAVENOUS
  Filled 2017-01-09 (×2): qty 500

## 2017-01-09 MED ORDER — ACETAMINOPHEN 650 MG RE SUPP: 650.0000 mg | Freq: Four times a day (QID) | RECTAL | Status: DC | PRN

## 2017-01-09 MED ORDER — POLYETHYLENE GLYCOL 3350 17 G PO PACK
17.0000 g | PACK | Freq: Every day | ORAL | Status: DC | PRN
  Administered 2017-01-14: 17 g via ORAL
  Filled 2017-01-09 (×2): qty 1

## 2017-01-09 MED ORDER — VITAMIN D (ERGOCALCIFEROL) 1.25 MG (50000 UNIT) PO CAPS
50000.0000 [IU] | ORAL_CAPSULE | ORAL | Status: DC
  Administered 2017-01-10: 50000 [IU] via ORAL
  Filled 2017-01-09: qty 1

## 2017-01-09 MED ORDER — DM-GUAIFENESIN ER 30-600 MG PO TB12
1.0000 | ORAL_TABLET | Freq: Two times a day (BID) | ORAL | Status: DC
  Administered 2017-01-09 – 2017-01-15 (×12): 1 via ORAL
  Filled 2017-01-09 (×12): qty 1

## 2017-01-09 MED ORDER — ATORVASTATIN CALCIUM 20 MG PO TABS
20.0000 mg | ORAL_TABLET | Freq: Every day | ORAL | Status: DC
  Administered 2017-01-09 – 2017-01-15 (×7): 20 mg via ORAL
  Filled 2017-01-09 (×7): qty 1

## 2017-01-09 MED ORDER — IPRATROPIUM-ALBUTEROL 0.5-2.5 (3) MG/3ML IN SOLN: 3.0000 mL | Freq: Four times a day (QID) | RESPIRATORY_TRACT | Status: DC | PRN

## 2017-01-09 MED ORDER — SODIUM CHLORIDE 0.9 % IV SOLN
INTRAVENOUS | Status: AC
  Administered 2017-01-09: 20:00:00 via INTRAVENOUS

## 2017-01-09 MED ORDER — ASPIRIN EC 81 MG PO TBEC
81.0000 mg | DELAYED_RELEASE_TABLET | Freq: Every day | ORAL | Status: DC
  Administered 2017-01-09 – 2017-01-15 (×7): 81 mg via ORAL
  Filled 2017-01-09 (×7): qty 1

## 2017-01-09 MED ORDER — NITROGLYCERIN 0.4 MG SL SUBL: 0.4000 mg | SUBLINGUAL_TABLET | SUBLINGUAL | Status: DC | PRN

## 2017-01-09 MED ORDER — DEXTROSE 5 % IV SOLN
500.0000 mg | Freq: Once | INTRAVENOUS | Status: AC
  Administered 2017-01-09: 500 mg via INTRAVENOUS
  Filled 2017-01-09: qty 500

## 2017-01-09 MED ORDER — GABAPENTIN 100 MG PO CAPS
100.0000 mg | ORAL_CAPSULE | Freq: Two times a day (BID) | ORAL | Status: DC
  Administered 2017-01-09 – 2017-01-12 (×6): 100 mg via ORAL
  Filled 2017-01-09 (×6): qty 1

## 2017-01-09 NOTE — ED Triage Notes (Signed)
Pt walked into ED with acute SOB. Pt stated he has never had this before. Pt is on blood thinners, pt denies chest pain.

## 2017-01-09 NOTE — ED Notes (Signed)
Patient transported to X-ray 

## 2017-01-09 NOTE — Telephone Encounter (Signed)
Spoke with pt's wife.    He has not been feeling well for past 2 days.  He has temp up to 100.9.  He has been feeling short of breath with exertion.  He has dry cough.  No sinus congestion, sore throat, nausea, vomiting.  No sick exposures.  Resting more in bed than usual.  Explained that he likely has a viral respiratory infection.  Explained that he could be developing pneumonia, and we have been seeing more flu cases.  Advised he would likely need a chest xray and flu swab.    She would like to wait and see how he does.  If he gets worse, then she will take him to an urgent care/emergency room.    Will have Triage f/u with the patient on 01/10/17, and arrange for acute visit.

## 2017-01-09 NOTE — Plan of Care (Signed)
  Safety: Ability to remain free from injury will improve 01/09/2017 2053 - Progressing by Mickie Kay, RN

## 2017-01-09 NOTE — ED Provider Notes (Signed)
Emergency Department Provider Note   I have reviewed the triage vital signs and the nursing notes.   HISTORY  Chief Complaint Shortness of Breath   HPI Jose KULIKOWSKI Sr. is a 83 y.o. male with a history of bladder cancer, COPD, coronary artery disease, pulmonary hypertension, retention, hyperlipidemia who presents to the emergency department today with a few days of progressively worsening dyspnea, cough and fevers.  T-max over the last 2 days is 101.7.  He has had increasing sleepiness increase intermittently productive cough and shortness of breath with exertion during this time as well.  Of note he had a cystoscopy on Thursday he has ciprofloxacin before and after the procedure.  He does not have any urinary symptoms such as dysuria, polyuria, increased frequency or urgency.  States that he seen by pulmonologist a few months ago and switched to inhalers and had been doing fine up until now.  Has been around multiple people but no known sick contacts. No other associated or modifying symptoms.    Past Medical History:  Diagnosis Date  . Arthritis    BACK  . Bifascicular block   . Bladder cancer ([place])   . BPH (benign prostatic hyperplasia)   . Chronic constipation   . Chronic restrictive lung disease   . COPD mixed type ([place])   . Coronary artery disease CARDIOLOGIST- DR Thompson Grayer   STRESS TEST 08-17-10 IN EPIC (LOW RISK ,  NO ISCHEMIA)  . GERD (gastroesophageal reflux disease)   . History of basal cell carcinoma excision BILATERAL ARMS  . History of colon polyps   . Hx of thyroid cancer    s/p total thyroidectomy,  . Hyperlipidemia   . Hypertension   . Hypothyroidism   . Hypothyroidism, postsurgical   . Nocturia   . Right bundle branch block   . Sigmoid diverticulosis   . Wears glasses   . Wears glasses     Patient Active Problem List   Diagnosis Date Noted  . SIRS (systemic inflammatory response syndrome) ([place]) 01/09/2017  . Chest pain 06/16/2014  . GERD  (gastroesophageal reflux disease) 06/16/2014  . Hypothyroidism 06/16/2014  . Pain in the chest   . Shortness of breath   . COPD GOLD II 08/08/2012  . Chronic restrictive lung disease 08/08/2012  . Fever 02/17/2012  . Chest pain 08/13/2010  . HTN (hypertension) 08/13/2010  . Hyperlipidemia 08/13/2010    Past Surgical History:  Procedure Laterality Date  . CARDIOVASCULAR STRESS TEST  08-17-2010   dr allred   Low risk nuclear study with prominent inferobasal thinning vs small prior infarct, no ischemia/  normal LV function and wall motion , ef 63%  . CATARACT EXTRACTION W/ INTRAOCULAR LENS  IMPLANT, BILATERAL    . CYSTOSCOPY W/ RETROGRADES Bilateral 10/08/2014   Procedure: CYSTOSCOPY WITH BILATERAL RETROGRADE PYELOGRAM, BIOPSY, FULGURATION;  Surgeon: Festus Aloe, MD;  Location: [facility];  Service: Urology;  Laterality: Bilateral;  . CYSTOSCOPY W/ RETROGRADES Bilateral 06/18/2016   Procedure: CYSTOSCOPY WITH RETROGRADE PYELOGRAM;  Surgeon: Festus Aloe, MD;  Location: [facility];  Service: Urology;  Laterality: Bilateral;  . CYSTOSCOPY WITH BIOPSY  12/16/2010   Procedure: CYSTOSCOPY WITH BIOPSY;  Surgeon: Molli Hazard, MD;  Location: [facility];  Service: Urology;  Laterality: N/A;  . CYSTOSCOPY WITH BIOPSY N/A 05/20/2015   Procedure: CYSTOSCOPY WITH BLADDER BIOPSY AND FULGERATION, CYSTOGRAM, BILATERAL RETROGRADES, BILATERAL RENAL WASHINGS;  Surgeon: Festus Aloe, MD;  [facility];  Service: Urology;  Laterality: N/A;  . CYSTOSCOPY WITH BIOPSY N/A 06/18/2016   Procedure: CYSTOSCOPY WITH BIOPSY FULGURATION;  Surgeon: Festus Aloe, MD;  Location: [facility];  Service: Urology;  Laterality: N/A;  . CYSTOSCOPY/RETROGRADE/URETEROSCOPY  12/16/2010   Procedure: CYSTOSCOPY/RETROGRADE/URETEROSCOPY;  Surgeon: Molli Hazard, MD;  Location[facility];  Service: Urology;  Laterality: Right;  bilateral  retrograde, right ureteroscopy  . EYE SURGERY     attached lens to iris  . GREEN LIGHT LASER TURP (TRANSURETHRAL RESECTION OF PROSTATE  11-02-2011  . KNEE ARTHROSCOPY Left 11/  2016  . TOTAL THYROIDECTOMY  1991  . TRANSURETHRAL RESECTION OF BLADDER TUMOR  12/16/2010   Procedure: TRANSURETHRAL RESECTION OF BLADDER TUMOR (TURBT);  Surgeon: Molli Hazard, MD;  Location: [facility];  Service: Urology;  Laterality: N/A;    Current Outpatient Rx  . Order #: [id number] Class: Historical Med  . Order #: [id number] Class: Historical Med  . Order #: [id number] Class: Historical Med  . Order #: [id number] Class: Historical Med  . Order #: [id number] Class: No Print  . Order #: [id number] Class: No Print  . Order #: [id number] Class: Historical Med  . Order #: [id number] Class: Historical Med  . Order #: [id number] Class: Normal  . Order #: [id number] Class: Historical Med  . Order #: [id number] Class: Historical Med  . Order #: [id number] Class: Historical Med  . Order #: [id number] Class: Normal    Allergies Patient has no known allergies.  Family History  Problem Relation Age of Onset  . Lung cancer Brother   . Thyroid cancer Unknown   . Diabetes Father     Social History Social History   Tobacco Use  . Smoking status: Former Smoker    Packs/day: 2.00    Years: 35.00    Pack years: 70.00    Types: Cigarettes    Last attempt to quit: 01/11/1982    Years since quitting: 35.0  . Smokeless tobacco: Never Used  Substance Use Topics  . Alcohol use: Yes    Alcohol/week: 0.0 oz    Comment: 6-oz vodka per day   . Drug use: No    Review of Systems  All other systems negative except as documented in the HPI. All pertinent positives and negatives as reviewed in the HPI. ____________________________________________   PHYSICAL EXAM:  VITAL SIGNS: Blood pressure (!) 114/50, pulse 88, temperature 99.3 F (37.4 C), temperature source Oral, resp. rate 20, height 6\' 4"  (1.93 m), weight  89.8 kg (198 lb), SpO2 94 %.   Constitutional: Alert and oriented. Well appearing and in no acute distress. Eyes: Conjunctivae are normal. PERRL. EOMI. Head: Atraumatic. Nose: No congestion/rhinnorhea. Mouth/Throat: Mucous membranes are moist.  Oropharynx non-erythematous. Neck: No stridor.  No meningeal signs.   Cardiovascular: Normal rate, regular rhythm. Good peripheral circulation. Grossly normal heart sounds.   Respiratory: tachypneic respiratory effort.  No retractions. Lungs intermittent wheezing and BLL crackles L>R. Gastrointestinal: Soft and nontender. No distention.  Musculoskeletal: No lower extremity tenderness nor edema. No gross deformities of extremities. Neurologic:  Normal speech and language. No gross focal neurologic deficits are appreciated.  Skin:  Skin is warm, dry and intact. No rash noted.   ____________________________________________   LABS (all labs ordered are listed, but only abnormal results are displayed)  Labs Reviewed  COMPREHENSIVE METABOLIC PANEL - Abnormal; Notable for the following components:      Result Value   Sodium 130 (*)    Chloride 97 (*)    Glucose, Bld 113 (*)    BUN 56 (*)  Creatinine, Ser 2.09 (*)    Calcium 8.8 (*)    ALT 16 (*)    Total Bilirubin 1.3 (*)    GFR calc non Af Amer 26 (*)    GFR calc Af Amer 31 (*)    All other components within normal limits  CBC WITH DIFFERENTIAL/PLATELET - Abnormal; Notable for the following components:   WBC 31.2 (*)    Hemoglobin 10.2 (*)    HCT 33.3 (*)    MCV 71.9 (*)    MCH 22.0 (*)    RDW 19.6 (*)    Neutro Abs 28.4 (*)    Lymphs Abs 0.6 (*)    Monocytes Absolute 2.2 (*)    All other components within normal limits  URINALYSIS, ROUTINE W REFLEX MICROSCOPIC - Abnormal; Notable for the following components:   Color, Urine AMBER (*)    APPearance TURBID (*)    Hgb urine dipstick MODERATE (*)    Protein, ur 100 (*)    Leukocytes, UA LARGE (*)    Bacteria, UA FEW (*)    All  other components within normal limits  I-STAT CG4 LACTIC ACID, ED - Abnormal; Notable for the following components:   Lactic Acid, Venous 2.71 (*)    All other components within normal limits  CULTURE, BLOOD (ROUTINE X 2)  CULTURE, BLOOD (ROUTINE X 2)  INFLUENZA PANEL BY PCR (TYPE A & B)  I-STAT TROPONIN, ED  I-STAT CG4 LACTIC ACID, ED   ____________________________________________  EKG   EKG Interpretation  Date/Time:  Sunday January 09 2017 09:42:42 EST Ventricular Rate:  91 PR Interval:    QRS Duration: 163 QT Interval:  380 QTC Calculation: 468 R Axis:   104 Text Interpretation:  Sinus rhythm RBBB and LPFB No significant change since last tracing in June  2018 Confirmed by Merrily Pew [phone]) on 01/09/2017 10:01:04 AM       ____________________________________________  RADIOLOGY  Dg Chest 2 View  Result Date: 01/09/2017 CLINICAL DATA:  Shortness of breath for 6 weeks. EXAM: CHEST  2 VIEW COMPARISON:  10/07/2016 FINDINGS: The heart size and mediastinal contours are within normal limits. Aortic atherosclerosis. Both lungs are clear. The visualized skeletal structures are unremarkable. IMPRESSION: No active cardiopulmonary disease. Electronically Signed   By: Kerby Moors M.D.   On: 01/09/2017 10:28    ____________________________________________   PROCEDURES  Procedure(s) performed:   Procedures   ____________________________________________   INITIAL IMPRESSION / ASSESSMENT AND PLAN / ED COURSE  Will eval for infectious cause of his symptoms. Suspect likely pneumonia at this time.   Patient covered for [employer] pneumonia.  Lactic acid came back elevated so fluids were given as well.  This resolved to 1.4.  Did have a white count of 31.2.  Urine does appear to be infected a urine culture was added on.  Blood cultures were drawn before antibiotics were given.  Secondary to his acute kidney injury, altered mental status, sepsis and level of leukocytosis  will plan for admission to ensure improvement.  Inpatient team requested influenza to be added on.     Pertinent labs & imaging results that were available during my care of the patient were reviewed by me and considered in my medical decision making (see chart for details).  ____________________________________________  FINAL CLINICAL IMPRESSION(S) / ED DIAGNOSES  Final diagnoses:  Urinary tract infection with hematuria, site unspecified  Sepsis, due to unspecified organism ([place])  AKI (acute kidney injury) ([place])     MEDICATIONS GIVEN DURING THIS VISIT:  Medications  sodium chloride 0.9 % bolus 1,000 mL (1,000 mLs Intravenous New Bag/Given 01/09/17 1118)  acetaminophen (TYLENOL) tablet 650 mg (650 mg Oral Given 01/09/17 1118)  cefTRIAXone (ROCEPHIN) 1 g in dextrose 5 % 50 mL IVPB (0 g Intravenous Stopped 01/09/17 1221)  azithromycin (ZITHROMAX) 500 mg in dextrose 5 % 250 mL IVPB (0 mg Intravenous Stopped 01/09/17 1340)     NEW OUTPATIENT MEDICATIONS STARTED DURING THIS VISIT:  This SmartLink is deprecated. Use AVSMEDLIST instead to display the medication list for a patient.  Note:  This note was prepared with assistance of Dragon voice recognition software. Occasional wrong-word or sound-a-like substitutions may have occurred due to the inherent limitations of voice recognition software.   Merrily Pew, MD 01/09/17 [phone]

## 2017-01-09 NOTE — ED Notes (Signed)
ED TO INPATIENT HANDOFF REPORT  Name/Age/Gender Jose Good Sr. 83 y.o. male  Code Status Code Status History    Date Active Date Inactive Code Status Order ID Comments User Context   06/16/2014 14:22 06/18/2014 17:20 Full Code [id number]  Barton Dubois, MD Inpatient   02/16/2012 10:30 02/18/2012 14:49 Full Code [id number]  Pryor Curia, RN Inpatient    Advance Directive Documentation     Most Recent Value  Type of Advance Directive  Living will  Pre-existing out of facility DNR order (yellow form or pink MOST form)  No data  "MOST" Form in Place?  No data      Home/SNF/Other Home  Chief Complaint shob/weakness/hand pain  Level of Care/Admitting Diagnosis ED Disposition    ED Disposition Condition [facility] Area: [place] [[id number]]  Level of Care: Med-Surg [16]  Diagnosis: SIRS (systemic inflammatory response syndrome) ([place]) [175102]  Admitting Physician: Barton Dubois [[id number]]  Attending Physician: Barton Dubois [[id number]]  Estimated length of stay: past midnight tomorrow  Certification:: I certify this patient will need inpatient services for at least 2 midnights  PT Class (Do Not Modify): Inpatient [101]  PT Acc Code (Do Not Modify): Private [1]       Medical History Past Medical History:  Diagnosis Date  . Arthritis    BACK  . Bifascicular block   . Bladder cancer ([place])   . BPH (benign prostatic hyperplasia)   . Chronic constipation   . Chronic restrictive lung disease   . COPD mixed type ([place])   . Coronary artery disease CARDIOLOGIST- DR Thompson Grayer   STRESS TEST 08-17-10 IN EPIC (LOW RISK ,  NO ISCHEMIA)  . GERD (gastroesophageal reflux disease)   . History of basal cell carcinoma excision BILATERAL ARMS  . History of colon polyps   . Hx of thyroid cancer    s/p total thyroidectomy,  . Hyperlipidemia   . Hypertension   . Hypothyroidism   . Hypothyroidism, postsurgical   . Nocturia   . Right bundle branch block    . Sigmoid diverticulosis   . Wears glasses   . Wears glasses     Allergies No Known Allergies  IV Location/Drains/Wounds Patient Lines/Drains/Airways Status   Active Line/Drains/Airways    Name:   Placement date:   Placement time:   Site:   Days:   Peripheral IV 01/09/17 Right;Lateral Forearm   01/09/17    0948    Forearm   less than 1          Labs/Imaging Results for orders placed or performed during the hospital encounter of 01/09/17 (from the past 48 hour(s))  I-Stat Troponin, ED (not at [facility])     Status: None   Collection Time: 01/09/17  9:47 AM  Result Value Ref Range   Troponin i, poc 0.08 0.00 - 0.08 ng/mL   Comment 3            Comment: Due to the release kinetics of cTnI, a negative result within the first hours of the onset of symptoms does not rule out myocardial infarction with certainty. If myocardial infarction is still suspected, repeat the test at appropriate intervals.   I-Stat CG4 Lactic Acid, ED     Status: Abnormal   Collection Time: 01/09/17  9:49 AM  Result Value Ref Range   Lactic Acid, Venous 2.71 (HH) 0.5 - 1.9 mmol/L   Comment NOTIFIED PHYSICIAN   Comprehensive metabolic panel     Status:  Abnormal   Collection Time: 01/09/17 10:11 AM  Result Value Ref Range   Sodium 130 (L) 135 - 145 mmol/L   Potassium 4.4 3.5 - 5.1 mmol/L   Chloride 97 (L) 101 - 111 mmol/L   CO2 22 22 - 32 mmol/L   Glucose, Bld 113 (H) 65 - 99 mg/dL   BUN 56 (H) 6 - 20 mg/dL   Creatinine, Ser 2.09 (H) 0.61 - 1.24 mg/dL   Calcium 8.8 (L) 8.9 - 10.3 mg/dL   Total Protein 7.0 6.5 - 8.1 g/dL   Albumin 3.6 3.5 - 5.0 g/dL   AST 21 15 - 41 U/L   ALT 16 (L) 17 - 63 U/L   Alkaline Phosphatase 72 38 - 126 U/L   Total Bilirubin 1.3 (H) 0.3 - 1.2 mg/dL   GFR calc non Af Amer 26 (L) >60 mL/min   GFR calc Af Amer 31 (L) >60 mL/min    Comment: (NOTE) The eGFR has been calculated using the CKD EPI equation. This calculation has not been validated in all clinical  situations. eGFR's persistently <60 mL/min signify possible Chronic Kidney Disease.    Anion gap 11 5 - 15  CBC WITH DIFFERENTIAL     Status: Abnormal   Collection Time: 01/09/17 10:11 AM  Result Value Ref Range   WBC 31.2 (H) 4.0 - 10.5 K/uL   RBC 4.63 4.22 - 5.81 MIL/uL   Hemoglobin 10.2 (L) 13.0 - 17.0 g/dL   HCT 33.3 (L) 39.0 - 52.0 %   MCV 71.9 (L) 78.0 - 100.0 fL   MCH 22.0 (L) 26.0 - 34.0 pg   MCHC 30.6 30.0 - 36.0 g/dL   RDW 19.6 (H) 11.5 - 15.5 %   Platelets 229 150 - 400 K/uL   Neutrophils Relative % 91 %   Lymphocytes Relative 2 %   Monocytes Relative 7 %   Eosinophils Relative 0 %   Basophils Relative 0 %   Neutro Abs 28.4 (H) 1.7 - 7.7 K/uL   Lymphs Abs 0.6 (L) 0.7 - 4.0 K/uL   Monocytes Absolute 2.2 (H) 0.1 - 1.0 K/uL   Eosinophils Absolute 0.0 0.0 - 0.7 K/uL   Basophils Absolute 0.0 0.0 - 0.1 K/uL   RBC Morphology POLYCHROMASIA PRESENT   Urinalysis, Routine w reflex microscopic     Status: Abnormal   Collection Time: 01/09/17 12:06 PM  Result Value Ref Range   Color, Urine AMBER (A) YELLOW    Comment: BIOCHEMICALS MAY BE AFFECTED BY COLOR   APPearance TURBID (A) CLEAR   Specific Gravity, Urine 1.015 1.005 - 1.030   pH 5.0 5.0 - 8.0   Glucose, UA NEGATIVE NEGATIVE mg/dL   Hgb urine dipstick MODERATE (A) NEGATIVE   Bilirubin Urine NEGATIVE NEGATIVE   Ketones, ur NEGATIVE NEGATIVE mg/dL   Protein, ur 100 (A) NEGATIVE mg/dL   Nitrite NEGATIVE NEGATIVE   Leukocytes, UA LARGE (A) NEGATIVE   RBC / HPF TOO NUMEROUS TO COUNT 0 - 5 RBC/hpf   WBC, UA TOO NUMEROUS TO COUNT 0 - 5 WBC/hpf   Bacteria, UA FEW (A) NONE SEEN   Squamous Epithelial / LPF NONE SEEN NONE SEEN   Budding Yeast PRESENT    WBC Casts, UA PRESENT   I-Stat CG4 Lactic Acid, ED     Status: None   Collection Time: 01/09/17 12:11 PM  Result Value Ref Range   Lactic Acid, Venous 1.40 0.5 - 1.9 mmol/L   Dg Chest 2 View  Result Date: 01/09/2017  CLINICAL DATA:  Shortness of breath for 6 weeks.  EXAM: CHEST  2 VIEW COMPARISON:  10/07/2016 FINDINGS: The heart size and mediastinal contours are within normal limits. Aortic atherosclerosis. Both lungs are clear. The visualized skeletal structures are unremarkable. IMPRESSION: No active cardiopulmonary disease. Electronically Signed   By: Kerby Moors M.D.   On: 01/09/2017 10:28    Pending Labs Unresulted Labs (From admission, onward)   Start     Ordered   01/09/17 1353  Influenza panel by PCR (type A & B)  STAT,   STAT     01/09/17 1352   01/09/17 1015  Blood Culture (routine x 2)  BLOOD CULTURE X 2,   STAT     01/09/17 1015      Vitals/Pain Today's Vitals   01/09/17 1330 01/09/17 1350 01/09/17 1400 01/09/17 1430  BP: 129/64  (!) 120/58 (!) 115/59  Pulse: 82  79 79  Resp: '15  17 12  '$ Temp:    98.2 F (36.8 C)  TempSrc:    Oral  SpO2: 96%  95% 97%  Weight:      Height:      PainSc:  Asleep      Isolation Precautions No active isolations  Medications Medications  sodium chloride 0.9 % bolus 1,000 mL (0 mLs Intravenous Stopped 01/09/17 1439)  acetaminophen (TYLENOL) tablet 650 mg (650 mg Oral Given 01/09/17 1118)  cefTRIAXone (ROCEPHIN) 1 g in dextrose 5 % 50 mL IVPB (0 g Intravenous Stopped 01/09/17 1221)  azithromycin (ZITHROMAX) 500 mg in dextrose 5 % 250 mL IVPB (0 mg Intravenous Stopped 01/09/17 1340)    Mobility walks

## 2017-01-09 NOTE — Progress Notes (Signed)
Pt. placed on continuous pulse-ox.  O2 reading 90 - 92%. Patient placed on 1 Liter O2 n.c.

## 2017-01-09 NOTE — H&P (Signed)
History and Physical    [name] ION:[id number] DOB: Oct 15, 1925 DOA: 01/09/2017  PCP: [name], MD   I have briefly reviewed patients previous medical reports in [facility].  Patient coming from: home  Chief Complaint: SOB, increase fatigue, general malaise and intermittent fever.  HPI: Jose FURTICK Sr. is a 66 with pmh significant for HTN, HLD, chronic diastolic HF, CKD stage, mixed type COPD, hypothyroidism and bladder cancer; who presented to ED with general malaise, increased SOB, intermittent fever/chills and increase fatigue. Symptoms present for the last 2 days and worsening. He also reported hands/fingers numbness (which awaken him at night). Patient denies CP, nausea, vomiting, dysuria, hematochezia, melena or any other complaints.  ED Course: CXR without frank infiltrates, patient with hyponatremia, acute on chronic renal failure, UA suggesting infection, elevated WBC's and lactic acidosis. Cultures taken, antibiotics initiated and [facility] called to admit patient for further evaluation and treatment.  Review of Systems:  All other systems reviewed and apart from HPI, are negative.  Past Medical History:  Diagnosis Date  . Arthritis    BACK  . Bifascicular block   . Bladder cancer ([place])   . BPH (benign prostatic hyperplasia)   . Chronic constipation   . Chronic restrictive lung disease   . COPD mixed type ([place])   . Coronary artery disease CARDIOLOGIST- DR Thompson Grayer   STRESS TEST 08-17-10 IN EPIC (LOW RISK ,  NO ISCHEMIA)  . GERD (gastroesophageal reflux disease)   . History of basal cell carcinoma excision BILATERAL ARMS  . History of colon polyps   . Hx of thyroid cancer    s/p total thyroidectomy,  . Hyperlipidemia   . Hypertension   . Hypothyroidism   . Hypothyroidism, postsurgical   . Nocturia   . Right bundle branch block   . Sigmoid diverticulosis   . Wears glasses   . Wears glasses     Past Surgical History:  Procedure  Laterality Date  . CARDIOVASCULAR STRESS TEST  08-17-2010   dr allred   Low risk nuclear study with prominent inferobasal thinning vs small prior infarct, no ischemia/  normal LV function and wall motion , ef 63%  . CATARACT EXTRACTION W/ INTRAOCULAR LENS  IMPLANT, BILATERAL    . CYSTOSCOPY W/ RETROGRADES Bilateral 10/08/2014   Procedure: CYSTOSCOPY WITH BILATERAL RETROGRADE PYELOGRAM, BIOPSY, FULGURATION;  Surgeon: Festus Aloe, MD;  Location: [facility];  Service: Urology;  Laterality: Bilateral;  . CYSTOSCOPY W/ RETROGRADES Bilateral 06/18/2016   Procedure: CYSTOSCOPY WITH RETROGRADE PYELOGRAM;  Surgeon: Festus Aloe, MD;  Location: [facility];  Service: Urology;  Laterality: Bilateral;  . CYSTOSCOPY WITH BIOPSY  12/16/2010   Procedure: CYSTOSCOPY WITH BIOPSY;  Surgeon: Molli Hazard, MD;  Location: [facility];  Service: Urology;  Laterality: N/A;  . CYSTOSCOPY WITH BIOPSY N/A 05/20/2015   Procedure: CYSTOSCOPY WITH BLADDER BIOPSY AND FULGERATION, CYSTOGRAM, BILATERAL RETROGRADES, BILATERAL RENAL WASHINGS;  Surgeon: Festus Aloe, MD;  Location: [facility];  Service: Urology;  Laterality: N/A;  . CYSTOSCOPY WITH BIOPSY N/A 06/18/2016   Procedure: CYSTOSCOPY WITH BIOPSY FULGURATION;  Surgeon: Festus Aloe, MD;  Location: [facility];  Service: Urology;  Laterality: N/A;  . CYSTOSCOPY/RETROGRADE/URETEROSCOPY  12/16/2010   Procedure: CYSTOSCOPY/RETROGRADE/URETEROSCOPY;  Surgeon: Molli Hazard, MD;  Location: [facility];  Service: Urology;  Laterality: Right;  bilateral retrograde, right ureteroscopy  . EYE SURGERY     attached lens to iris  . GREEN LIGHT LASER TURP (TRANSURETHRAL  RESECTION OF PROSTATE  11-02-2011  . KNEE ARTHROSCOPY Left 11/  2016  . TOTAL THYROIDECTOMY  1991  . TRANSURETHRAL RESECTION OF BLADDER TUMOR  12/16/2010   Procedure: TRANSURETHRAL RESECTION OF BLADDER TUMOR (TURBT);  Surgeon: Molli Hazard, MD;  Location[facility]  Service: Urology;  Laterality: N/A;    Social History  reports that he quit smoking about 35 years ago. His smoking use included cigarettes. He has a 70.00 pack-year smoking history. he has never used smokeless tobacco. He reports that he drinks alcohol. He reports that he does not use drugs.  No Known Allergies  Family History  Problem Relation Age of Onset  . Lung cancer Brother   . Thyroid cancer Unknown   . Diabetes Father      Prior to Admission medications   Medication Sig Start Date End Date Taking? Authorizing Provider  aspirin EC 81 MG tablet Take 81 mg by mouth daily.   Yes [provider]  atorvastatin (LIPITOR) 20 MG tablet Take 20 mg by mouth daily after breakfast.    Yes [provider]  iron polysaccharides (NIFEREX) 150 MG capsule Take 150 mg by mouth daily.   Yes [provider]  levothyroxine (SYNTHROID, LEVOTHROID) 175 MCG tablet Take 175 mcg by mouth daily after breakfast.    Yes [provider]  losartan (COZAAR) 50 MG tablet Take 1 tablet (50 mg total) by mouth daily. 07/03/14  Yes Allred, Jeneen Rinks, MD  pantoprazole (PROTONIX) 40 MG tablet Take 1 tablet (40 mg total) by mouth daily. Patient taking differently: Take 40 mg by mouth daily after breakfast.  07/03/14  Yes Allred, Jeneen Rinks, MD  polyethylene glycol powder (GLYCOLAX/MIRALAX) powder Take 17 g by mouth daily as needed (for constipation).   Yes [provider]  testosterone enanthate (DELATESTRYL) 200 MG/ML injection Inject 200 mg into the muscle every 28 (twenty-eight) days. For IM use only   Yes [provider]  Tiotropium Bromide-Olodaterol (STIOLTO RESPIMAT) 2.5-2.5 MCG/ACT AERS Inhale 2 puffs into the lungs daily. 09/30/16  Yes Mannam, Praveen, MD  triamterene-hydrochlorothiazide (MAXZIDE-25) 37.5-25 MG tablet Take 1 tablet by mouth daily. 03/10/16  Yes [provider]  vitamin B-12 (CYANOCOBALAMIN)  1000 MCG tablet Take 1,000 mcg by mouth 3 (three) times a week.   Yes [provider]  Vitamin D, Ergocalciferol, (DRISDOL) 50000 units CAPS capsule Take 50,000 Units by mouth every Monday.    Yes [provider]  nitroGLYCERIN (NITROSTAT) 0.4 MG SL tablet Place 1 tablet (0.4 mg total) under the tongue every 5 (five) minutes as needed for chest pain. 07/03/14   Thompson Grayer, MD    Physical Exam: Vitals:   01/09/17 1330 01/09/17 1400 01/09/17 1430 01/09/17 1519  BP: 129/64 (!) 120/58 (!) 115/59 (!) 115/51  Pulse: 82 79 79 81  Resp: 15 17 12 16   Temp:   98.2 F (36.8 C) 98.6 F (37 C)  TempSrc:   Oral Oral  SpO2: 96% 95% 97% 96%  Weight:    92.1 kg (203 lb 0.7 oz)  Height:    6\' 4"  (1.93 m)      Constitutional: reporting SOB with minimal exertion and complaining of numbness in his hands/fingers. No CP, no using accessory muscles. Eyes: PERTLA, lids and conjunctivae normal, no icterus, no nystagmus. ENMT: Mucous membranes are dry on exam. Posterior pharynx clear of any exudate or lesions. Normal dentition.  Neck: supple, no masses, no thyromegaly, no JVD Respiratory: positive scattered rhonchi, no wheezing, no  use of accessory muscles. Slight tachypnea appreciated on exam. Patient using 2L [place] supplementation. Cardiovascular: S1 & S2 heard, regular rate and rhythm, no murmurs / rubs / gallops. Trace lower extremity edema. 2+ pedal pulses. No carotid bruits.  Abdomen: No distension, no tenderness, no masses palpated. No hepatosplenomegaly. Bowel sounds normal.  Musculoskeletal: no clubbing / cyanosis. No joint deformity upper and lower extremities. Good ROM, no contractures. Normal muscle tone.  Skin: no rashes, lesions, ulcers. No induration Neurologic: CN 2-12 grossly intact. Sensation intact, DTR normal. Strength 5/5 in all 4 limbs.  Psychiatric: Normal judgment and insight. Alert and oriented x 3. Normal mood.   Labs on Admission: I have personally reviewed  following labs and imaging studies  CBC: Recent Labs  Lab 01/09/17 1011  WBC 31.2*  NEUTROABS 28.4*  HGB 10.2*  HCT 33.3*  MCV 71.9*  PLT 527   Basic Metabolic Panel: Recent Labs  Lab 01/09/17 1011  NA 130*  K 4.4  CL 97*  CO2 22  GLUCOSE 113*  BUN 56*  CREATININE 2.09*  CALCIUM 8.8*   Liver Function Tests: Recent Labs  Lab 01/09/17 1011  AST 21  ALT 16*  ALKPHOS 72  BILITOT 1.3*  PROT 7.0  ALBUMIN 3.6   Urine analysis:    Component Value Date/Time   COLORURINE AMBER (A) 01/09/2017 1206   APPEARANCEUR TURBID (A) 01/09/2017 1206   LABSPEC 1.015 01/09/2017 1206   PHURINE 5.0 01/09/2017 1206   GLUCOSEU NEGATIVE 01/09/2017 1206   HGBUR MODERATE (A) 01/09/2017 [place] 01/09/2017 [place] 01/09/2017 1206   PROTEINUR 100 (A) 01/09/2017 1206   UROBILINOGEN 0.2 05/14/2011 1846   NITRITE NEGATIVE 01/09/2017 1206   LEUKOCYTESUR LARGE (A) 01/09/2017 1206    Radiological Exams on Admission: Dg Chest 2 View  Result Date: 01/09/2017 CLINICAL DATA:  Shortness of breath for 6 weeks. EXAM: CHEST  2 VIEW COMPARISON:  10/07/2016 FINDINGS: The heart size and mediastinal contours are within normal limits. Aortic atherosclerosis. Both lungs are clear. The visualized skeletal structures are unremarkable. IMPRESSION: No active cardiopulmonary disease. Electronically Signed   By: Kerby Moors M.D.   On: 01/09/2017 10:28    EKG:  No acute ischemic changes, normal sinus rhythm; unchanged RBBB.   Assessment/Plan 1-SOB/SIRS (systemic inflammatory response syndrome) ([place]): no PNA on initial CXR; UA dirty, but patient w/o urinary tract symptoms. -will empirically cover with rocephin and zithromax -continue IVF's and supportive care -patient SOB, cough and general malaise suggesting bronchitis vs early PNA -repeat CXR 2-view in am after IVF's -follow cultures -influenza by PCR neg -continue duoneb and pulmicort while inpatient. -no signs  of fluid overload on exam  2-elevated lactic acid -improved/resolved with IVF's boluses given in ED -will monitor now.  3-HTN (hypertension) -BP stable -holding ARB and diuretics with acute on chronic renal failure  4-Hyperlipidemia -continue heart healthy diet -recommending outpatient follow up of his lipid panel   5-COPD GOLD II -no wheezing currently and with good air movement -recent PFT's unchanged -while inpatient continue will use duoneb and pulmicort  6-GERD (gastroesophageal reflux disease) -continue PPI  7-Hypothyroidism -check TSH -continue synthroid  8-Acute on chronic renal failure [facility]): patient with stage 3 at baseline; Cr normally in 1.2-1.3 range -on admission 2.09 -continue IVF's -antibiotics for presumed UTI (even patient not having symptoms) -follow cultures results and Cr trend   9-Urinary tract infection with hematuria  -empirically cover with rocephin -patient with recent cystoscopy  -follow culture results -continue  IVF's  9-chronic diastolic HF -compensated -follow daily weights -low sodium diet -strict intake and output    Time: 70 minutes   DVT prophylaxis: Heparin   Code Status: Full Family Communication: wife at bedside  Disposition Plan: home when medically stable.  Consults called: none   Admission status: inpatient, LOS > 2 midnights, med-surg bed    Barton Dubois MD [facility] Pager [phone]  If 7PM-7AM, please contact night-coverage [email or web address] Password [facility]  01/09/2017, 7:41 PM

## 2017-01-10 ENCOUNTER — Inpatient Hospital Stay (HOSPITAL_COMMUNITY): Payer: Medicare Other

## 2017-01-10 LAB — CBC
HEMATOCRIT: 27.5 % — AB (ref 39.0–52.0)
HEMOGLOBIN: 8.1 g/dL — AB (ref 13.0–17.0)
MCH: 21.1 pg — AB (ref 26.0–34.0)
MCHC: 29.5 g/dL — ABNORMAL LOW (ref 30.0–36.0)
MCV: 71.8 fL — AB (ref 78.0–100.0)
Platelets: 191 10*3/uL (ref 150–400)
RBC: 3.83 MIL/uL — AB (ref 4.22–5.81)
RDW: 19.6 % — ABNORMAL HIGH (ref 11.5–15.5)
WBC: 19.1 10*3/uL — ABNORMAL HIGH (ref 4.0–10.5)

## 2017-01-10 LAB — BASIC METABOLIC PANEL
ANION GAP: 7 (ref 5–15)
BUN: 54 mg/dL — ABNORMAL HIGH (ref 6–20)
CHLORIDE: 102 mmol/L (ref 101–111)
CO2: 25 mmol/L (ref 22–32)
Calcium: 8.2 mg/dL — ABNORMAL LOW (ref 8.9–10.3)
Creatinine, Ser: 1.78 mg/dL — ABNORMAL HIGH (ref 0.61–1.24)
GFR calc non Af Amer: 32 mL/min — ABNORMAL LOW (ref >60)
GFR, EST AFRICAN AMERICAN: 37 mL/min — AB (ref >60)
Glucose, Bld: 101 mg/dL — ABNORMAL HIGH (ref 65–99)
Potassium: 4.2 mmol/L (ref 3.5–5.1)
Sodium: 134 mmol/L — ABNORMAL LOW (ref 135–145)

## 2017-01-10 LAB — VITAMIN B12: VITAMIN B 12: 427 pg/mL (ref 180–914)

## 2017-01-10 LAB — HEMOGLOBIN A1C
Hgb A1c MFr Bld: 5.8 % — ABNORMAL HIGH (ref 4.8–5.6)
Mean Plasma Glucose: 119.76 mg/dL

## 2017-01-10 MED ORDER — HYDROCODONE-ACETAMINOPHEN 5-325 MG PO TABS
1.0000 | ORAL_TABLET | ORAL | Status: DC | PRN
  Administered 2017-01-10: 2 via ORAL
  Administered 2017-01-11 – 2017-01-12 (×3): 1 via ORAL
  Filled 2017-01-10 (×2): qty 1
  Filled 2017-01-10: qty 2
  Filled 2017-01-10: qty 1

## 2017-01-10 NOTE — Progress Notes (Signed)
Patient ID: Jose Good Sr., male   DOB: Aug 17, 1925, 83 y.o.   MRN: [id number]  PROGRESS NOTE    Jose RICCIUTI Sr.  ZCH:[id number] DOB: November 27, 1925 DOA: 01/09/2017  PCP: [name], MD   Brief Narrative:  83 year old male with hypertension,chronic diastolic CHF, CKD stage 3, bladder cancer who presented to ED with general malaise, increased SOB, intermittent fever/chills and increase fatiguet for the last 2 days. CXR without infiltrates, patient with hyponatremia, acute on chronic renal failure, UA suggesting infection, elevated WBC's and lactic acidosis. Cultures taken, antibiotics initiated.   Assessment & Plan:   Principal Problem:   Severe sepsis / community acquired pneumonia and UTI - Severe sepsis criteria met on admission with low grade fever, tachypnea, hypotension, leukocytosis and lactic acidosis in addition to suspected source of infection: UTI and CAP - Lactic acid is 2.71; repeat level is WNL - UA showed large leukocytes  - CXR showed no acute infiltrates  - Blood cultures ordered but urine cx not so will place order to collected urine cx - Started broad spectrum antibiotics: azithromycin and rocephin   Active Problems:   UTI - UA showed large leukocytes - Continue current abx - Follow up urine cx results     HTN (hypertension), essential - Losartan on hold, BP stable without antihypertensive meds    Hyperlipidemia - Continue statin therapy    Acute renal failure superimposed on CKD stage 3 - Baseline Cr in 2017 is 1.46 - Cr 1.78 on this admission likely elevated due to acute infectious process - Follow up daily BMP    Hypothyroidism - Continue Synthroid     DVT prophylaxis: Heparin subQ Code Status: full code  Family Communication: wife at bedside  Disposition Plan: home once stable   Consultants:   PT  Procedures:   None   Antimicrobials:   Azithromycin and rocephin 12/30 -->   Subjective: No overnight  events.  Objective: Vitals:   01/09/17 2009 01/09/17 2052 01/10/17 0429 01/10/17 0751  BP: 112/65  123/60   Pulse: 87  77   Resp: 18  16   Temp: 98.4 F (36.9 C)  98 F (36.7 C)   TempSrc: Oral  Oral   SpO2: 94% 98% 93% (!) 87%  Weight:      Height:        Intake/Output Summary (Last 24 hours) at 01/10/2017 0825 Last data filed at 01/10/2017 0400 Gross per 24 hour  Intake 1292.5 ml  Output -  Net 1292.5 ml   Filed Weights   01/09/17 0945 01/09/17 1519  Weight: 89.8 kg (198 lb) 92.1 kg (203 lb 0.7 oz)    Examination:  General exam: Appears calm and comfortable  Respiratory system: Clear to auscultation. Respiratory effort normal. Cardiovascular system: S1 & S2 heard, Rate controlled  Gastrointestinal system: Abdomen is nondistended, soft and nontender. No organomegaly or masses felt. Normal bowel sounds heard. Central nervous system: Alert and oriented. No focal neurological deficits. Extremities: Symmetric 5 x 5 power. Skin: No rashes, lesions or ulcers Psychiatry: Judgement and insight appear normal. Mood & affect appropriate.   Data Reviewed: I have personally reviewed following labs and imaging studies  CBC: Recent Labs  Lab 01/09/17 1011 01/10/17 0416  WBC 31.2* 19.1*  NEUTROABS 28.4*  --   HGB 10.2* 8.1*  HCT 33.3* 27.5*  MCV 71.9* 71.8*  PLT 229 287   Basic Metabolic Panel: Recent Labs  Lab 01/09/17 1011 01/09/17 2124 01/10/17 0416  NA 130*  --  134*  K 4.4  --  4.2  CL 97*  --  102  CO2 22  --  25  GLUCOSE 113*  --  101*  BUN 56*  --  54*  CREATININE 2.09*  --  1.78*  CALCIUM 8.8*  --  8.2*  MG  --  1.7  --   PHOS  --  2.9  --    GFR: Estimated Creatinine Clearance: 34.5 mL/min (A) (by C-G formula based on SCr of 1.78 mg/dL (H)). Liver Function Tests: Recent Labs  Lab 01/09/17 1011  AST 21  ALT 16*  ALKPHOS 72  BILITOT 1.3*  PROT 7.0  ALBUMIN 3.6   No results for input(s): LIPASE, AMYLASE in the last 168 hours. No results for  input(s): AMMONIA in the last 168 hours. Coagulation Profile: No results for input(s): INR, PROTIME in the last 168 hours. Cardiac Enzymes: No results for input(s): CKTOTAL, CKMB, CKMBINDEX, TROPONINI in the last 168 hours. BNP (last 3 results) No results for input(s): PROBNP in the last 8760 hours. HbA1C: Recent Labs    01/09/17 2124  HGBA1C 5.8*   CBG: No results for input(s): GLUCAP in the last 168 hours. Lipid Profile: No results for input(s): CHOL, HDL, LDLCALC, TRIG, CHOLHDL, LDLDIRECT in the last 72 hours. Thyroid Function Tests: Recent Labs    01/09/17 2124  TSH 2.347   Anemia Panel: Recent Labs    01/09/17 2124  VITAMINB12 427   Urine analysis:    Component Value Date/Time   COLORURINE AMBER (A) 01/09/2017 1206   APPEARANCEUR TURBID (A) 01/09/2017 1206   LABSPEC 1.015 01/09/2017 1206   PHURINE 5.0 01/09/2017 1206   GLUCOSEU NEGATIVE 01/09/2017 1206   HGBUR MODERATE (A) 01/09/2017 1206   BILIRUBINUR NEGATIVE 01/09/2017 1206   KETONESUR NEGATIVE 01/09/2017 1206   PROTEINUR 100 (A) 01/09/2017 1206   UROBILINOGEN 0.2 05/14/2011 1846   NITRITE NEGATIVE 01/09/2017 1206   LEUKOCYTESUR LARGE (A) 01/09/2017 1206   Sepsis Labs: '@LABRCNTIP'$ (procalcitonin:4,lacticidven:4)   ) Recent Results (from the past 240 hour(s))  Blood Culture (routine x 2)     Status: None (Preliminary result)   Collection Time: 01/09/17 10:20 AM  Result Value Ref Range Status   Specimen Description   Final    BLOOD LEFT ANTECUBITAL Performed at [facility], [place] [address], [place], [place] [address]    Special Requests   Final    BOTTLES DRAWN AEROBIC AND ANAEROBIC Blood Culture adequate volume   Culture PENDING  Incomplete   Report Status PENDING  Incomplete      Radiology Studies: Dg Chest 2 View  Result Date: 01/09/2017 CLINICAL DATA:  Shortness of breath for 6 weeks. EXAM: CHEST  2 VIEW COMPARISON:  10/07/2016 FINDINGS: The heart size and mediastinal contours are  within normal limits. Aortic atherosclerosis. Both lungs are clear. The visualized skeletal structures are unremarkable. IMPRESSION: No active cardiopulmonary disease. Electronically Signed   By: Kerby Moors M.D.   On: 01/09/2017 10:28        Scheduled Meds: . aspirin EC  81 mg Oral Daily  . atorvastatin  20 mg Oral QPC breakfast  . budesonide (PULMICORT) nebulizer solution  0.5 mg Nebulization BID  . dextromethorphan-guaiFENesin  1 tablet Oral BID  . gabapentin  100 mg Oral BID  . heparin  5,000 Units Subcutaneous Q8H  . iron polysaccharides  150 mg Oral Daily  . levothyroxine  175 mcg Oral QAC breakfast  . pantoprazole  40 mg Oral Daily  .  vitamin B-12  1,000 mcg Oral Once per day on Mon Wed Fri  . Vitamin D (Ergocalciferol)  50,000 Units Oral Q Mon   Continuous Infusions: . sodium chloride 75 mL/hr at 01/09/17 2022  . azithromycin (ZITHROMAX) 500 MG IVPB    . cefTRIAXone (ROCEPHIN) IVPB 1 gram/50 mL D5W       LOS: 1 day    Time spent: 25 minutes  Greater than 50% of the time spent on counseling and coordinating the care.   Leisa Lenz, MD [facility] Pager [phone]  If 7PM-7AM, please contact night-coverage [email or web address] Password [email or web address] 01/10/2017, 8:25 AM

## 2017-01-10 NOTE — Telephone Encounter (Signed)
Left message for pt x1  After called pt and left a message to check on pt after seeing the note from Dr. Halford Chessman, looked at pt's chart and saw where pt had been admitted to [facility] yesterday, 12/30. Will route this message to Dr. Halford Chessman as an Juluis Rainier.

## 2017-01-11 ENCOUNTER — Inpatient Hospital Stay (HOSPITAL_COMMUNITY): Payer: Medicare Other

## 2017-01-11 LAB — BASIC METABOLIC PANEL
ANION GAP: 6 (ref 5–15)
BUN: 49 mg/dL — ABNORMAL HIGH (ref 6–20)
CO2: 26 mmol/L (ref 22–32)
Calcium: 8.4 mg/dL — ABNORMAL LOW (ref 8.9–10.3)
Chloride: 102 mmol/L (ref 101–111)
Creatinine, Ser: 1.77 mg/dL — ABNORMAL HIGH (ref 0.61–1.24)
GFR calc non Af Amer: 32 mL/min — ABNORMAL LOW (ref >60)
GFR, EST AFRICAN AMERICAN: 37 mL/min — AB (ref >60)
Glucose, Bld: 106 mg/dL — ABNORMAL HIGH (ref 65–99)
Potassium: 4.4 mmol/L (ref 3.5–5.1)
Sodium: 134 mmol/L — ABNORMAL LOW (ref 135–145)

## 2017-01-11 LAB — APTT: APTT: 36 s (ref 24–36)

## 2017-01-11 LAB — CBC
HEMATOCRIT: 27.3 % — AB (ref 39.0–52.0)
HEMOGLOBIN: 8.1 g/dL — AB (ref 13.0–17.0)
MCH: 21.7 pg — ABNORMAL LOW (ref 26.0–34.0)
MCHC: 29.7 g/dL — ABNORMAL LOW (ref 30.0–36.0)
MCV: 73 fL — ABNORMAL LOW (ref 78.0–100.0)
Platelets: 239 10*3/uL (ref 150–400)
RBC: 3.74 MIL/uL — ABNORMAL LOW (ref 4.22–5.81)
RDW: 19.6 % — ABNORMAL HIGH (ref 11.5–15.5)
WBC: 12.8 10*3/uL — ABNORMAL HIGH (ref 4.0–10.5)

## 2017-01-11 LAB — PROTIME-INR
INR: 1.09
Prothrombin Time: 14 seconds (ref 11.4–15.2)

## 2017-01-11 LAB — D-DIMER, QUANTITATIVE: D-Dimer, Quant: 1.07 ug/mL-FEU — ABNORMAL HIGH (ref 0.00–0.50)

## 2017-01-11 MED ORDER — HEPARIN SODIUM (PORCINE) 5000 UNIT/ML IJ SOLN
5000.0000 [IU] | Freq: Three times a day (TID) | INTRAMUSCULAR | Status: DC
  Administered 2017-01-12: 5000 [IU] via SUBCUTANEOUS
  Filled 2017-01-11: qty 1

## 2017-01-11 MED ORDER — AZITHROMYCIN 250 MG PO TABS
500.0000 mg | ORAL_TABLET | ORAL | Status: DC
  Administered 2017-01-11 – 2017-01-12 (×2): 500 mg via ORAL
  Filled 2017-01-11 (×2): qty 2

## 2017-01-11 MED ORDER — HEPARIN (PORCINE) IN NACL 100-0.45 UNIT/ML-% IJ SOLN
1500.0000 [IU]/h | INTRAMUSCULAR | Status: DC
  Filled 2017-01-11: qty 250

## 2017-01-11 NOTE — Progress Notes (Signed)
PHARMACY - HEPARIN  Received a phone call from Dr Florene Glen asking to hold off on starting IV heparin infusion.  Dr Florene Glen will like to wait until results of VQ scan in the morning.  Received orders to D/C heparin infusion and resume heparin 5000 unit sq q8h.  Leone Haven, PharmD

## 2017-01-11 NOTE — Progress Notes (Signed)
PHARMACIST - PHYSICIAN COMMUNICATION DR:   Quincy Simmonds CONCERNING: Antibiotic IV to Oral Route Change Policy  RECOMMENDATION: This patient is receiving Azithromycin by the intravenous route.  Based on criteria approved by the Pharmacy and Therapeutics Committee, the antibiotic(s) is/are being converted to the equivalent oral dose form(s).   DESCRIPTION: These criteria include:  Patient being treated for a respiratory tract infection, urinary tract infection, cellulitis or clostridium difficile associated diarrhea if on metronidazole  The patient is not neutropenic and does not exhibit a GI malabsorption state  The patient is eating (either orally or via tube) and/or has been taking other orally administered medications for a least 24 hours  The patient is improving clinically and has a Tmax < 100.5  If you have questions about this conversion, please contact the Pharmacy Department  []   [phone] )  [facility] []   [phone] )  [facility] []   [phone] )  [facility] []   [phone] )  [facility] [x]   [phone] )  [place], PharmD, BCPS 01/11/2017 7:26 AM

## 2017-01-11 NOTE — Progress Notes (Addendum)
PROGRESS NOTE    Jose SHONK Sr.  QIO:[id number] DOB: 1925-12-26 DOA: 01/09/2017 PCP: [name], MD   Brief Narrative:  84 year old male with hypertension,chronic diastolic CHF, CKD stage 3, bladder cancer who presented to ED with general malaise, increased SOB, intermittent fever/chills and increase fatiguet for the last 2 days. CXR without infiltrates, patient with hyponatremia, acute on chronic renal failure, UA suggesting infection, elevated WBC's and lactic acidosis. Cultures taken, antibiotics initiated.  Assessment & Plan:   Principal Problem:   SIRS (systemic inflammatory response syndrome) (HCC) Active Problems:   HTN (hypertension)   Hyperlipidemia   COPD GOLD II   GERD (gastroesophageal reflux disease)   Hypothyroidism   Shortness of breath   Acute on chronic renal failure (HCC)   Urinary tract infection with hematuria    Severe sepsis / community acquired pneumonia and UTI - Severe sepsis criteria met on admission with low grade fever, tachypnea, hypotension, leukocytosis and lactic acidosis in addition to suspected source of infection: UTI and CAP - Lactic acid is 2.71; repeat level is WNL - UA showed large leukocytes  - CXR showed no acute infiltrates  - Blood cultures pending  - urine cx pending (after abx)  - Started broad spectrum antibiotics: azithromycin and rocephin   Acute Hypoxic Resp Failure  Hx COPD  Pulm Hypertension: Has followed with Dr. Vaughan Browner in past, but no hx O2 requirement.  Had high res CT in 2018 without evidence of interstitial lung disease.  - new O2 requirement, no clear consolidation on CXR - will follow up chest CT today - also add on d dimer and follow up V/Q scan as indicated given renal function if positive Addendum: positive d dimer, will f/u VQ scan.  Initially ordered heparin, but will hold off given stability, lack of CP and tachycardia and infectious symptoms at initial presentation. '[ ]'$  consider pulm c/s if above  not revealing given his new O2 requirment  UTI  Abnormal UA with hematuria - UA showed large leukocytes as well as RBC's - Continue current abx - Follow up urine cx results (collected after abx)  '[ ]'$  will need repeat UA as outpatient to f/u hematuria (though he recently had cystoscopy)  HTN (hypertension), essential - Losartan on hold, BP stable without antihypertensive meds  Hyperlipidemia - Continue statin therapy  Acute renal failure superimposed on CKD stage 3 - Baseline Cr fluctuates, recently in 2018 as low as 1.27 - Cr 2.09 on this admission likely elevated due to acute infectious process, improved to 1.77 - consider renal US during admission if not improving - Follow up daily BMP  Hypothyroidism - Continue Synthroid  Numbness: to bilateral hands, notes diminished sensation towards fingertips.  No weakness.  No back pain.  Sounds c/w peripheral neuropathy.  Normal TSH.  B12 normal.  Continue to monitor for now.   DVT prophylaxis: heparin Code Status: full  Family Communication: wife at bedside Disposition Plan: pending improvement   Consultants:   none  Procedures:   none  Antimicrobials:  Anti-infectives (From admission, onward)   Start     Dose/Rate Route Frequency Ordered Stop   01/11/17 1200  azithromycin (ZITHROMAX) tablet 500 mg     500 mg Oral Every 24 hours 01/11/17 0726     01/10/17 1200  azithromycin (ZITHROMAX) 500 mg in dextrose 5 % 250 mL IVPB  Status:  Discontinued     500 mg 250 mL/hr over 60 Minutes Intravenous Every 24 hours 01/09/17 1940 01/11/17 0726  01/10/17 1100  cefTRIAXone (ROCEPHIN) 1 g in dextrose 5 % 50 mL IVPB     1 g 100 mL/hr over 30 Minutes Intravenous Every 24 hours 01/09/17 1940     01/09/17 1030  cefTRIAXone (ROCEPHIN) 1 g in dextrose 5 % 50 mL IVPB     1 g 100 mL/hr over 30 Minutes Intravenous  Once 01/09/17 1019 01/09/17 1221   01/09/17 1030  azithromycin (ZITHROMAX) 500 mg in dextrose 5 % 250 mL IVPB     500  mg 250 mL/hr over 60 Minutes Intravenous  Once 01/09/17 1019 01/09/17 1340     Subjective: Presented with fever, sob, numbness to bilateral hands. SOB with exertion, not sure how this is now as he hasn't been up and walking.  Numbness to bilateral hands persistent, comes and goes.  No weakness, back pain, incontinence, loss of bowel/bladder function. No cough.  No sick contacts or travel.   Objective: Vitals:   01/11/17 0848 01/11/17 0849 01/11/17 0850 01/11/17 0852  BP:      Pulse:      Resp:      Temp:      TempSrc:      SpO2: (!) 87% 92% 92% 92%  Weight:      Height:        Intake/Output Summary (Last 24 hours) at 01/11/2017 1004 Last data filed at 01/11/2017 0846 Gross per 24 hour  Intake 1140 ml  Output -  Net 1140 ml   Filed Weights   01/09/17 0945 01/09/17 1519  Weight: 89.8 kg (198 lb) 92.1 kg (203 lb 0.7 oz)    Examination:  General exam: Appears calm and comfortable  Respiratory system: Clear to auscultation. Respiratory effort normal. Cardiovascular system: S1 & S2 heard, RRR. No JVD, murmurs, rubs, gallops or clicks. No pedal edema. Gastrointestinal system: Abdomen is nondistended, soft and nontender. No organomegaly or masses felt. Normal bowel sounds heard. Central nervous system: Alert and oriented. CN 2-12 intact.  5/5 strength to upper and lower extremities.  Sensation intact to light touch (he notes diminished towards fingertips). Extremities: Symmetric 5 x 5 power. Skin: No rashes, lesions or ulcers Psychiatry: Judgement and insight appear normal. Mood & affect appropriate.     Data Reviewed: I have personally reviewed following labs and imaging studies  CBC: Recent Labs  Lab 01/09/17 1011 01/10/17 0416 01/11/17 0410  WBC 31.2* 19.1* 12.8*  NEUTROABS 28.4*  --   --   HGB 10.2* 8.1* 8.1*  HCT 33.3* 27.5* 27.3*  MCV 71.9* 71.8* 73.0*  PLT 229 191 170   Basic Metabolic Panel: Recent Labs  Lab 01/09/17 1011 01/09/17 2124 01/10/17 0416  01/11/17 0410  NA 130*  --  134* 134*  K 4.4  --  4.2 4.4  CL 97*  --  102 102  CO2 22  --  25 26  GLUCOSE 113*  --  101* 106*  BUN 56*  --  54* 49*  CREATININE 2.09*  --  1.78* 1.77*  CALCIUM 8.8*  --  8.2* 8.4*  MG  --  1.7  --   --   PHOS  --  2.9  --   --    GFR: Estimated Creatinine Clearance: 34.7 mL/min (Jose Lawson) (by C-G formula based on SCr of 1.77 mg/dL (H)). Liver Function Tests: Recent Labs  Lab 01/09/17 1011  AST 21  ALT 16*  ALKPHOS 72  BILITOT 1.3*  PROT 7.0  ALBUMIN 3.6   No results for input(s): LIPASE, AMYLASE  in the last 168 hours. No results for input(s): AMMONIA in the last 168 hours. Coagulation Profile: No results for input(s): INR, PROTIME in the last 168 hours. Cardiac Enzymes: No results for input(s): CKTOTAL, CKMB, CKMBINDEX, TROPONINI in the last 168 hours. BNP (last 3 results) No results for input(s): PROBNP in the last 8760 hours. HbA1C: Recent Labs    01/09/17 2124  HGBA1C 5.8*   CBG: No results for input(s): GLUCAP in the last 168 hours. Lipid Profile: No results for input(s): CHOL, HDL, LDLCALC, TRIG, CHOLHDL, LDLDIRECT in the last 72 hours. Thyroid Function Tests: Recent Labs    01/09/17 2124  TSH 2.347   Anemia Panel: Recent Labs    01/09/17 2124  VITAMINB12 427   Sepsis Labs: Recent Labs  Lab 01/09/17 0949 01/09/17 1211  LATICACIDVEN 2.71* 1.40    Recent Results (from the past 240 hour(s))  Blood Culture (routine x 2)     Status: None (Preliminary result)   Collection Time: 01/09/17 10:15 AM  Result Value Ref Range Status   Specimen Description BLOOD RIGHT FOREARM  Final   Special Requests   Final    BOTTLES DRAWN AEROBIC AND ANAEROBIC Blood Culture adequate volume   Culture   Final    NO GROWTH < 24 HOURS Performed at [facility], [place] [address] [place], [place] [address]    Report Status PENDING  Incomplete  Blood Culture (routine x 2)     Status: None (Preliminary result)   Collection Time: 01/09/17  10:20 AM  Result Value Ref Range Status   Specimen Description BLOOD LEFT ANTECUBITAL  Final   Special Requests   Final    BOTTLES DRAWN AEROBIC AND ANAEROBIC Blood Culture adequate volume   Culture   Final    NO GROWTH < 24 HOURS Performed at [facility], [place] [address], [place], [place] [address]    Report Status PENDING  Incomplete         Radiology Studies: X-ray Chest Pa And Lateral  Result Date: 01/10/2017 CLINICAL DATA:  Shortness of Breath EXAM: CHEST  2 VIEW COMPARISON:  January 09, 2017 chest radiograph and chest CT October 07, 2016 FINDINGS: There is mild bibasilar scarring. There is no appreciable edema or consolidation. Heart size and pulmonary vascularity are normal. No adenopathy. There is aortic atherosclerosis. There is postoperative change at the cervicothoracic junction. IMPRESSION: Mild bibasilar scarring. No edema or consolidation. There is aortic atherosclerosis. Aortic Atherosclerosis (ICD10-I70.0). Electronically Signed   By: Lowella Grip III M.D.   On: 01/10/2017 09:13   Dg Chest 2 View  Result Date: 01/09/2017 CLINICAL DATA:  Shortness of breath for 6 weeks. EXAM: CHEST  2 VIEW COMPARISON:  10/07/2016 FINDINGS: The heart size and mediastinal contours are within normal limits. Aortic atherosclerosis. Both lungs are clear. The visualized skeletal structures are unremarkable. IMPRESSION: No active cardiopulmonary disease. Electronically Signed   By: Kerby Moors M.D.   On: 01/09/2017 10:28        Scheduled Meds: . aspirin EC  81 mg Oral Daily  . atorvastatin  20 mg Oral QPC breakfast  . azithromycin  500 mg Oral Q24H  . budesonide (PULMICORT) nebulizer solution  0.5 mg Nebulization BID  . dextromethorphan-guaiFENesin  1 tablet Oral BID  . gabapentin  100 mg Oral BID  . heparin  5,000 Units Subcutaneous Q8H  . iron polysaccharides  150 mg Oral Daily  . levothyroxine  175 mcg Oral QAC breakfast  . pantoprazole  40  mg Oral Daily  .  vitamin B-12  1,000 mcg Oral Once per day on Mon Wed Fri  . Vitamin D (Ergocalciferol)  50,000 Units Oral Q Mon   Continuous Infusions: . cefTRIAXone (ROCEPHIN) IVPB 1 gram/50 mL D5W Stopped (01/10/17 1534)     LOS: 2 days    Time spent: over 30 min    Fayrene Helper, MD [facility] Pager [phone]  If 7PM-7AM, please contact night-coverage [email or web address] Password [email or web address] 01/11/2017, 10:04 AM

## 2017-01-11 NOTE — Progress Notes (Signed)
ANTICOAGULATION CONSULT NOTE - Initial Consult  Pharmacy Consult for Heparin Indication: rule out pulmonary embolus  No Known Allergies  Patient Measurements: Height: 6\' 4"  (193 cm) Weight: 203 lb 0.7 oz (92.1 kg) IBW/kg (Calculated) : 86.8 Heparin Dosing Weight: 92kg  Vital Signs: Temp: 98.5 F (36.9 C) (01/01 2112) Temp Source: Oral (01/01 2112) BP: 129/53 (01/01 2112) Pulse Rate: 71 (01/01 2112)  Labs: Recent Labs    01/09/17 1011 01/10/17 0416 01/11/17 0410  HGB 10.2* 8.1* 8.1*  HCT 33.3* 27.5* 27.3*  PLT 229 191 239  CREATININE 2.09* 1.78* 1.77*    Estimated Creatinine Clearance: 34.7 mL/min (A) (by C-G formula based on SCr of 1.77 mg/dL (H)).   Medical History: Past Medical History:  Diagnosis Date  . Arthritis    BACK  . Bifascicular block   . Bladder cancer ([place])   . BPH (benign prostatic hyperplasia)   . Chronic constipation   . Chronic restrictive lung disease   . COPD mixed type ([place])   . Coronary artery disease CARDIOLOGIST- DR Thompson Grayer   STRESS TEST 08-17-10 IN EPIC (LOW RISK ,  NO ISCHEMIA)  . GERD (gastroesophageal reflux disease)   . History of basal cell carcinoma excision BILATERAL ARMS  . History of colon polyps   . Hx of thyroid cancer    s/p total thyroidectomy,  . Hyperlipidemia   . Hypertension   . Hypothyroidism   . Hypothyroidism, postsurgical   . Nocturia   . Right bundle branch block   . Sigmoid diverticulosis   . Wears glasses   . Wears glasses     Medications:  Scheduled:  . aspirin EC  81 mg Oral Daily  . atorvastatin  20 mg Oral QPC breakfast  . azithromycin  500 mg Oral Q24H  . budesonide (PULMICORT) nebulizer solution  0.5 mg Nebulization BID  . dextromethorphan-guaiFENesin  1 tablet Oral BID  . gabapentin  100 mg Oral BID  . iron polysaccharides  150 mg Oral Daily  . levothyroxine  175 mcg Oral QAC breakfast  . pantoprazole  40 mg Oral Daily  . vitamin B-12  1,000 mcg Oral Once per day on Mon Wed Fri  .  Vitamin D (Ergocalciferol)  50,000 Units Oral Q Mon   Infusions:  . cefTRIAXone (ROCEPHIN) IVPB 1 gram/50 mL D5W Stopped (01/11/17 1055)   PRN: acetaminophen **OR** acetaminophen, HYDROcodone-acetaminophen, ipratropium-albuterol, nitroGLYCERIN, ondansetron **OR** ondansetron (ZOFRAN) IV, polyethylene glycol  Assessment: 84 y.o. Male admitted 12/31 with severe sepsis, CAP and UTI.  Now with new O2 requirements and elevated d-dimer.  Pharmacy consulted to dose IV heparin for r/o PE, VQ scan pending.   Has been receiving SQ heparin 5000 units q8h - most recent dose received 21:17  Baseline PTT, PT/INR pending  Hgb 8.1, Plts 239k, no bleeding reported  No anticoagulants prior to admission  Goal of Therapy:  Heparin level 0.3-0.7 units/ml Monitor platelets by anticoagulation protocol: Yes   Plan:   No heparin bolus since just received SQ heparin  Heparin IV infusion 1500 units/hr (~16 units/kg/hr)  Check heparin level 8hr after starting heparin drip  Daily heparin level and CBC  Peggyann Juba, PharmD, BCPS Pager: [phone] 01/11/2017,9:56 PM

## 2017-01-11 NOTE — Progress Notes (Signed)
SATURATION QUALIFICATIONS: (This note is used to comply with regulatory documentation for home oxygen)  Patient Saturations on Room Air at Rest = 91%  Patient Saturations on Room Air while Ambulating = 81%  Patient Saturations on 2 Liters of oxygen while Ambulating = 86%  Please briefly explain why patient needs home oxygen: to maintain appropriate SaO2 levels with activity.   Blondell Reveal Kistler PT 01/11/2017  [phone]

## 2017-01-11 NOTE — Evaluation (Signed)
Physical Therapy Evaluation Patient Details Name: Jose STROHECKER Sr. MRN: [id number] DOB: [birth year] Today's Date: 01/11/2017   History of Present Illness  84 year old male with hypertension,chronic diastolic CHF, CKD stage 3, bladder cancer who presented to ED with general malaise, increased SOB, intermittent fever/chills and increase fatigue. Dx of sepsis, UTI, acute on chronic renal failure.  Clinical Impression  Pt admitted with above diagnosis. Pt currently with functional limitations due to the deficits listed below (see PT Problem List).  Pt is mildly unsteady with ambulation likely due to decreased activity since onset of illness. At baseline he is independent with mobility. Today he ambulated 150' with a RW and min/guard assist for balance, SaO2 dropped to 81% on room air walking, 86% on 2L O2 walking. Pt will benefit from skilled PT to increase their independence and safety with mobility to allow discharge to the venue listed below.       Follow Up Recommendations Likely No PT follow up after acute stay    Equipment Recommendations  Rolling walker with 5" wheels;Other (comment)(home O2)    Recommendations for Other Services       Precautions / Restrictions Precautions Precautions: Fall Precaution Comments: pt denies h/o falls in past 1 year but was mildly unsteady today; monitor O2 Restrictions Weight Bearing Restrictions: No      Mobility  Bed Mobility Overal bed mobility: Modified Independent             General bed mobility comments: with bedrail  Transfers Overall transfer level: Needs assistance Equipment used: Rolling walker (2 wheeled) Transfers: Sit to/from Stand Sit to Stand: Min guard         General transfer comment: VCs hand placement, increased time/effort to stand from recliner using armrests  Ambulation/Gait Ambulation/Gait assistance: Min guard Ambulation Distance (Feet): 150 Feet Assistive device: Rolling walker (2 wheeled) Gait  Pattern/deviations: Step-through pattern;Decreased stride length   Gait velocity interpretation: at or above normal speed for age/gender General Gait Details: steady with RW, SaO2 81% on room air, 86% on 2L O2 walking, 2/4 dyspnea, distance limited by dyspnea  Stairs            Wheelchair Mobility    Modified Rankin (Stroke Patients Only)       Balance Overall balance assessment: Needs assistance   Sitting balance-Leahy Scale: Good       Standing balance-Leahy Scale: Fair Standing balance comment: steady with BUE support, unsteady when attempted to ambulate without RW                             Pertinent Vitals/Pain Pain Assessment: No/denies pain    Home Living Family/patient expects to be discharged to:: Private residence Living Arrangements: Spouse/significant other Available Help at Discharge: Family;Available 24 hours/day Type of Home: House Home Access: Stairs to enter   [employer] of Steps: 4 Home Layout: Two level;Bed/bath upstairs Home Equipment: Cane - single point      Prior Function Level of Independence: Independent               Hand Dominance        Extremity/Trunk Assessment   Upper Extremity Assessment Upper Extremity Assessment: Overall WFL for tasks assessed    Lower Extremity Assessment Lower Extremity Assessment: Overall WFL for tasks assessed    Cervical / Trunk Assessment Cervical / Trunk Assessment: Normal  Communication   Communication: No difficulties  Cognition Arousal/Alertness: Awake/alert Behavior During Therapy: WFL for  tasks assessed/performed Overall Cognitive Status: Within Functional Limits for tasks assessed                                        General Comments      Exercises     Assessment/Plan    PT Assessment Patient needs continued PT services  PT Problem List Decreased activity tolerance;Decreased balance;Cardiopulmonary status limiting  activity       PT Treatment Interventions DME instruction;Gait training;Therapeutic exercise;Therapeutic activities;Functional mobility training;Patient/family education    PT Goals (Current goals can be found in the Care Plan section)  Acute Rehab PT Goals Patient Stated Goal: pt likes to do woodworking PT Goal Formulation: With patient/family Time For Goal Achievement: 01/25/17 Potential to Achieve Goals: Good    Frequency Min 3X/week   Barriers to discharge        Co-evaluation               AM-PAC PT "6 Clicks" Daily Activity  Outcome Measure Difficulty turning over in bed (including adjusting bedclothes, sheets and blankets)?: None Difficulty moving from lying on back to sitting on the side of the bed? : None Difficulty sitting down on and standing up from a chair with arms (e.g., wheelchair, bedside commode, etc,.)?: A Little Help needed moving to and from a bed to chair (including a wheelchair)?: A Little Help needed walking in hospital room?: A Little Help needed climbing 3-5 steps with a railing? : A Lot 6 Click Score: 19    End of Session Equipment Utilized During Treatment: Gait belt;Oxygen Activity Tolerance: Patient limited by fatigue Patient left: in chair;with call bell/phone within reach;with family/visitor present Nurse Communication: Mobility status PT Visit Diagnosis: Difficulty in walking, not elsewhere classified (R26.2);Unsteadiness on feet (R26.81)    Time: 9024-0973 PT Time Calculation (min) (ACUTE ONLY): 24 min   Charges:   PT Evaluation $PT Eval Low Complexity: 1 Low PT Treatments $Gait Training: 8-22 mins   PT G Codes:          Philomena Doheny 01/11/2017, 11:38 AM [phone]

## 2017-01-12 ENCOUNTER — Inpatient Hospital Stay (HOSPITAL_COMMUNITY): Payer: Medicare Other

## 2017-01-12 DIAGNOSIS — R609 Edema, unspecified: Secondary | ICD-10-CM

## 2017-01-12 DIAGNOSIS — R6510 Systemic inflammatory response syndrome (SIRS) of non-infectious origin without acute organ dysfunction: Secondary | ICD-10-CM

## 2017-01-12 LAB — COMPREHENSIVE METABOLIC PANEL
ALT: 24 U/L (ref 17–63)
ANION GAP: 7 (ref 5–15)
AST: 18 U/L (ref 15–41)
Albumin: 2.9 g/dL — ABNORMAL LOW (ref 3.5–5.0)
Alkaline Phosphatase: 74 U/L (ref 38–126)
BUN: 38 mg/dL — ABNORMAL HIGH (ref 6–20)
CHLORIDE: 102 mmol/L (ref 101–111)
CO2: 28 mmol/L (ref 22–32)
Calcium: 8.6 mg/dL — ABNORMAL LOW (ref 8.9–10.3)
Creatinine, Ser: 1.43 mg/dL — ABNORMAL HIGH (ref 0.61–1.24)
GFR calc non Af Amer: 42 mL/min — ABNORMAL LOW (ref >60)
GFR, EST AFRICAN AMERICAN: 49 mL/min — AB (ref >60)
Glucose, Bld: 96 mg/dL (ref 65–99)
Potassium: 4.6 mmol/L (ref 3.5–5.1)
SODIUM: 137 mmol/L (ref 135–145)
Total Bilirubin: 0.6 mg/dL (ref 0.3–1.2)
Total Protein: 6.1 g/dL — ABNORMAL LOW (ref 6.5–8.1)

## 2017-01-12 LAB — CBC
HCT: 29.4 % — ABNORMAL LOW (ref 39.0–52.0)
HEMOGLOBIN: 8.4 g/dL — AB (ref 13.0–17.0)
MCH: 21.1 pg — AB (ref 26.0–34.0)
MCHC: 28.6 g/dL — ABNORMAL LOW (ref 30.0–36.0)
MCV: 73.7 fL — ABNORMAL LOW (ref 78.0–100.0)
PLATELETS: 304 10*3/uL (ref 150–400)
RBC: 3.99 MIL/uL — AB (ref 4.22–5.81)
RDW: 19.4 % — ABNORMAL HIGH (ref 11.5–15.5)
WBC: 8.3 10*3/uL (ref 4.0–10.5)

## 2017-01-12 LAB — URINE CULTURE

## 2017-01-12 LAB — HEPARIN LEVEL (UNFRACTIONATED): Heparin Unfractionated: 0.21 IU/mL — ABNORMAL LOW (ref 0.30–0.70)

## 2017-01-12 MED ORDER — TECHNETIUM TO 99M ALBUMIN AGGREGATED
4.4000 | Freq: Once | INTRAVENOUS | Status: AC | PRN
  Administered 2017-01-12: 4.4 via INTRAVENOUS

## 2017-01-12 MED ORDER — HEPARIN BOLUS VIA INFUSION
3000.0000 [IU] | Freq: Once | INTRAVENOUS | Status: AC
  Administered 2017-01-12: 3000 [IU] via INTRAVENOUS
  Filled 2017-01-12: qty 3000

## 2017-01-12 MED ORDER — TECHNETIUM TC 99M DIETHYLENETRIAME-PENTAACETIC ACID
28.8000 | Freq: Once | INTRAVENOUS | Status: AC | PRN
  Administered 2017-01-12: 28.8 via INTRAVENOUS

## 2017-01-12 MED ORDER — HEPARIN (PORCINE) IN NACL 100-0.45 UNIT/ML-% IJ SOLN
1500.0000 [IU]/h | INTRAMUSCULAR | Status: DC
  Administered 2017-01-12: 1500 [IU]/h via INTRAVENOUS
  Filled 2017-01-12 (×2): qty 250

## 2017-01-12 MED ORDER — SODIUM CHLORIDE 0.9 % IV SOLN: INTRAVENOUS | Status: DC

## 2017-01-12 MED ORDER — GABAPENTIN 100 MG PO CAPS
100.0000 mg | ORAL_CAPSULE | Freq: Three times a day (TID) | ORAL | Status: DC
  Administered 2017-01-12 – 2017-01-14 (×6): 100 mg via ORAL
  Filled 2017-01-12 (×6): qty 1

## 2017-01-12 NOTE — Progress Notes (Signed)
PROGRESS NOTE [facility]   STEFAN MARKARIAN Sr.   BOF:[id number] DOB: [birth year]  DOA: 01/09/2017 PCP: [name], MD   Brief Narrative:  Jose Burly Cottman Sr. is a 84 year old male with hypertension,chronic diastolic CHF, CKD stage 3, bladder cancer who presented to ED withgeneral malaise, increased SOB, intermittent fever/chills and increase fatigue for the last 2 days. CXR without infiltrates, patient with hyponatremia, acute on chronic renal failure, UA suggesting infection, elevated WBC's and lactic acidosis. Cultures taken, antibiotics initiated.  Subjective: Patient seen and examined, breathing no significantly improved. Patient c/o b/l hand pain, numbness and tingling sensation. No other concerns.   Assessment & Plan:   Principal Problem:   SIRS (systemic inflammatory response syndrome) (HCC) Active Problems:   HTN (hypertension)   Hyperlipidemia   COPD GOLD II   GERD (gastroesophageal reflux disease)   Hypothyroidism   Shortness of breath   Acute on chronic renal failure (HCC)   Urinary tract infection with hematuria  Severe sepsis / community acquired pneumonia and UTI - Severe sepsis criteria met on admission withlow gradefever, tachypnea, hypotension, leukocytosisand lactic acidosis in addition to suspected source of infection: UTI and CAP - Lactic acid is2.71; repeat level is WNL - UA grossly abnormal, urine culture performed after abx - insignificant growth  - Blood cultures NGT x 3  - Patient on Rocephin and Azithro.   Acute Hypoxic Resp Failure  Hx COPD  Pulm Hypertension - suspected PE  - Continues to have high O2 requirements, D dimer positive, V/Q scan intermediate risk 2 perfusion defects, normal ventilation  - Will start heparin ggt, get doppler LE [place] to r/o DVT  - Will start gentle hydration if Cr in AM less than 1.2 would consider CTA for definitive diagnosis  - Continue to wean O2 as tolerated  - If CTA negative will consult PCCM     UTI  Abnormal UA with hematuria - UA showed large leukocytes as well as RBC's - Continue current abx - Urine cultures insignificant growth although collected after abx   HTN (hypertension), essential - BP stable  -Losartan on hold due to AKI   Acute renal failure superimposed on CKD stage 3 - Baseline Cr fluctuates, recently in 2018 as low as 1.27 - Cr 2.09 on this admission likely elevated due to acute infectious process - Improving, gentle hydration  - Check BMP in AM   Hypothyroidism - Continue Synthroid  Hand pain and numbness  Bilateral numbness and pain, worse in AM, c/w arthritic pain and possible peripheral neuropathy.  Increase gabapentin, may need outpatient EMG for ? Carpal tunnel syndrome   DVT prophylaxis: Heparin ggt  Code Status: Full code  Family Communication: Wife at bedside  Disposition Plan: Home when medically stable   Consultants:   None   Procedures:   V/Q scan   Antimicrobials: Anti-infectives (From admission, onward)   Start     Dose/Rate Route Frequency Ordered Stop   01/11/17 1200  azithromycin (ZITHROMAX) tablet 500 mg     500 mg Oral Every 24 hours 01/11/17 0726     01/10/17 1200  azithromycin (ZITHROMAX) 500 mg in dextrose 5 % 250 mL IVPB  Status:  Discontinued     500 mg 250 mL/hr over 60 Minutes Intravenous Every 24 hours 01/09/17 1940 01/11/17 0726   01/10/17 1100  cefTRIAXone (ROCEPHIN) 1 g in dextrose 5 % 50 mL IVPB     1 g 100 mL/hr over 30 Minutes Intravenous Every 24 hours 01/09/17 1940  01/09/17 1030  cefTRIAXone (ROCEPHIN) 1 g in dextrose 5 % 50 mL IVPB     1 g 100 mL/hr over 30 Minutes Intravenous  Once 01/09/17 1019 01/09/17 1221   01/09/17 1030  azithromycin (ZITHROMAX) 500 mg in dextrose 5 % 250 mL IVPB     500 mg 250 mL/hr over 60 Minutes Intravenous  Once 01/09/17 1019 01/09/17 1340        Objective: Vitals:   01/12/17 0618 01/12/17 0930 01/12/17 0932 01/12/17 1422  BP: 136/60   (!) 124/51  Pulse:  77   71  Resp: 17   16  Temp: 98.5 F (36.9 C)   98.7 F (37.1 C)  TempSrc: Oral   Oral  SpO2: 97% 98% 98% 99%  Weight:      Height:        Intake/Output Summary (Last 24 hours) at 01/12/2017 1642 Last data filed at 01/12/2017 0930 Gross per 24 hour  Intake 240 ml  Output -  Net 240 ml   Filed Weights   01/09/17 0945 01/09/17 1519  Weight: 89.8 kg (198 lb) 92.1 kg (203 lb 0.7 oz)    Examination:  General exam: Appears calm and comfortable  HEENT: AC/AT, PERRLA, OP moist and clear Respiratory system: Air entry diminished, mild rhonchi on the left base. No wheezing  Cardiovascular system: S1 & S2 heard, RRR. + systolic murmur  Gastrointestinal system: Abdomen is nondistended, soft and nontender.  Central nervous system: Alert and oriented. No focal neurological deficits. Extremities: b/l LE edema   Skin: No rashes Psychiatry: Judgement and insight appear normal. Mood & affect appropriate.    Data Reviewed: I have personally reviewed following labs and imaging studies  CBC: Recent Labs  Lab 01/09/17 1011 01/10/17 0416 01/11/17 0410 01/12/17 0414  WBC 31.2* 19.1* 12.8* 8.3  NEUTROABS 28.4*  --   --   --   HGB 10.2* 8.1* 8.1* 8.4*  HCT 33.3* 27.5* 27.3* 29.4*  MCV 71.9* 71.8* 73.0* 73.7*  PLT 229 191 239 993   Basic Metabolic Panel: Recent Labs  Lab 01/09/17 1011 01/09/17 2124 01/10/17 0416 01/11/17 0410 01/12/17 0414  NA 130*  --  134* 134* 137  K 4.4  --  4.2 4.4 4.6  CL 97*  --  102 102 102  CO2 22  --  '25 26 28  '$ GLUCOSE 113*  --  101* 106* 96  BUN 56*  --  54* 49* 38*  CREATININE 2.09*  --  1.78* 1.77* 1.43*  CALCIUM 8.8*  --  8.2* 8.4* 8.6*  MG  --  1.7  --   --   --   PHOS  --  2.9  --   --   --    GFR: Estimated Creatinine Clearance: 43 mL/min (A) (by C-G formula based on SCr of 1.43 mg/dL (H)). Liver Function Tests: Recent Labs  Lab 01/09/17 1011 01/12/17 0414  AST 21 18  ALT 16* 24  ALKPHOS 72 74  BILITOT 1.3* 0.6  PROT 7.0 6.1*   ALBUMIN 3.6 2.9*   No results for input(s): LIPASE, AMYLASE in the last 168 hours. No results for input(s): AMMONIA in the last 168 hours. Coagulation Profile: Recent Labs  Lab 01/11/17 2154  INR 1.09   Cardiac Enzymes: No results for input(s): CKTOTAL, CKMB, CKMBINDEX, TROPONINI in the last 168 hours. BNP (last 3 results) No results for input(s): PROBNP in the last 8760 hours. HbA1C: Recent Labs    01/09/17 2124  HGBA1C 5.8*  CBG: No results for input(s): GLUCAP in the last 168 hours. Lipid Profile: No results for input(s): CHOL, HDL, LDLCALC, TRIG, CHOLHDL, LDLDIRECT in the last 72 hours. Thyroid Function Tests: Recent Labs    01/09/17 2124  TSH 2.347   Anemia Panel: Recent Labs    01/09/17 2124  VITAMINB12 427   Sepsis Labs: Recent Labs  Lab 01/09/17 0949 01/09/17 1211  LATICACIDVEN 2.71* 1.40    Recent Results (from the past 240 hour(s))  Blood Culture (routine x 2)     Status: None (Preliminary result)   Collection Time: 01/09/17 10:15 AM  Result Value Ref Range Status   Specimen Description BLOOD RIGHT FOREARM  Final   Special Requests   Final    BOTTLES DRAWN AEROBIC AND ANAEROBIC Blood Culture adequate volume   Culture   Final    NO GROWTH 3 DAYS Performed at [facility], [place] [address], [place], [place] [address]    Report Status PENDING  Incomplete  Blood Culture (routine x 2)     Status: None (Preliminary result)   Collection Time: 01/09/17 10:20 AM  Result Value Ref Range Status   Specimen Description BLOOD LEFT ANTECUBITAL  Final   Special Requests   Final    BOTTLES DRAWN AEROBIC AND ANAEROBIC Blood Culture adequate volume   Culture   Final    NO GROWTH 3 DAYS Performed at [facility], [place] [address] [place], [place] [address]    Report Status PENDING  Incomplete  Culture, Urine     Status: Abnormal   Collection Time: 01/10/17  6:39 PM  Result Value Ref Range Status   Specimen Description URINE, RANDOM  Final    Special Requests NONE  Final   Culture (A)  Final    <10,000 COLONIES/mL INSIGNIFICANT GROWTH Performed at [facility], [place] [address], [place], [place] [address]    Report Status 01/12/2017 FINAL  Final      Radiology Studies: Ct Chest Wo Contrast  Result Date: 01/11/2017 CLINICAL DATA:  General malaise and shortness of breath. Admitted for UTI. EXAM: CT CHEST WITHOUT CONTRAST TECHNIQUE: Multidetector CT imaging of the chest was performed following the standard protocol without IV contrast. COMPARISON:  CT chest dated September of 27, 2018. FINDINGS: Cardiovascular: No significant vascular findings. Normal heart size. No pericardial effusion. Normal caliber thoracic aorta. Coronary, aortic arch, and branch vessel atherosclerotic vascular disease. Aberrant origin of the right subclavian artery. The main pulmonary artery remains dilated, measuring up to 3.7 cm. Mediastinum/Nodes: No enlarged mediastinal or axillary lymph nodes. The thyroid gland is surgically absent. Small secretions within the distal trachea. The esophagus demonstrates no significant findings. Lungs/Pleura: Trace right pleural effusion with adjacent mild right basilar atelectasis. Very mild interlobular septal thickening, predominantly in the bilateral upper lobes. Mild centrilobular emphysema. Scattered calcified granulomas again noted. The previously described ground-glass nodule in the right upper lobe and subpleural peripheral nodule in the left lower lobe are no longer identified. Unchanged 3 mm solid nodule in the lingula (series 5, image 106). Stable subpleural 4 mm nodule in the peripheral right upper lobe (series 5, image 36). No pneumothorax or consolidation. Upper Abdomen: No acute abnormality. Musculoskeletal: Bilateral gynecomastia. No acute or significant osseous findings. IMPRESSION: 1. Trace right pleural effusion with adjacent mild bibasilar atelectasis. No consolidation. 2. Resolution of previously seen right  upper lobe ground-glass nodule and subpleural left lower lobe nodule. Stable 4 mm subpleural right upper lobe pulmonary nodule. 3. Stable dilatation of  the main pulmonary artery, suggesting pulmonary arterial hypertension. 4.  Emphysema (ICD10-J43.9). 5.  Aortic atherosclerosis (ICD10-I70.0). Electronically Signed   By: Titus Dubin M.D.   On: 01/11/2017 17:54   Nm Pulmonary Perf And Vent  Result Date: 01/12/2017 CLINICAL DATA:  Inpatient. Worsening dyspnea. Positive D-dimer. History of bladder cancer and thyroid cancer. EXAM: NUCLEAR MEDICINE VENTILATION - PERFUSION LUNG SCAN TECHNIQUE: Ventilation images were obtained in multiple projections using inhaled aerosol Tc-63mDTPA. Perfusion images were obtained in multiple projections after intravenous injection of Tc-957mAA. RADIOPHARMACEUTICALS:  28.8 mCi Technetium-9944mPA aerosol inhalation and 4.4 mCi Technetium-33m35m IV COMPARISON:  01/11/2017 chest CT.  01/10/2017 chest radiograph. FINDINGS: Ventilation: Heterogeneous ventilation of both lungs with central airway radiotracer accumulation. Perfusion: Large matched segmental perfusion defect in the right lower lobe. Subsegmental matched perfusion defects in the right middle lobe and lingula. IMPRESSION: Intermediate probability for pulmonary embolism (20- 79%) by PIOPED II. Electronically Signed   By: JasoIlona Sorrel.   On: 01/12/2017 12:53      Scheduled Meds: . aspirin EC  81 mg Oral Daily  . atorvastatin  20 mg Oral QPC breakfast  . azithromycin  500 mg Oral Q24H  . budesonide (PULMICORT) nebulizer solution  0.5 mg Nebulization BID  . dextromethorphan-guaiFENesin  1 tablet Oral BID  . gabapentin  100 mg Oral TID  . iron polysaccharides  150 mg Oral Daily  . levothyroxine  175 mcg Oral QAC breakfast  . pantoprazole  40 mg Oral Daily  . vitamin B-12  1,000 mcg Oral Once per day on Mon Wed Fri  . Vitamin D (Ergocalciferol)  50,000 Units Oral Q Mon   Continuous Infusions: . sodium  chloride 50 mL/hr at 01/12/17 1320  . cefTRIAXone (ROCEPHIN) IVPB 1 gram/50 mL D5W Stopped (01/12/17 1226)  . heparin 1,500 Units/hr (01/12/17 1521)     LOS: 3 days    Time spent: Total of 25 minutes spent with pt, greater than 50% of which was spent in discussion of  treatment, counseling and coordination of care   EdwiChipper Oman Pager: Text Page via [email or web address]   If 7PM-7AM, please contact night-coverage [email or web address] 01/12/2017, 4:42 PM

## 2017-01-12 NOTE — Progress Notes (Signed)
LE venous duplex prelim: negative for DVT. Flavia Bruss Eunice, RDMS, RVT  

## 2017-01-12 NOTE — Progress Notes (Signed)
ANTICOAGULATION CONSULT NOTE   Pharmacy Consult for heparin  Indication: suspected new pulmonary embolus  No Known Allergies  Patient Measurements: Height: 6\' 4"  (193 cm) Weight: 203 lb 0.7 oz (92.1 kg) IBW/kg (Calculated) : 86.8 Heparin Dosing Weight: 92 kg  Vital Signs: Temp: 98.5 F (36.9 C) (01/02 0618) Temp Source: Oral (01/02 0618) BP: 136/60 (01/02 0618) Pulse Rate: 77 (01/02 0618)  Labs: Recent Labs    01/10/17 0416 01/11/17 0410 01/11/17 2154 01/12/17 0414  HGB 8.1* 8.1*  --  8.4*  HCT 27.5* 27.3*  --  29.4*  PLT 191 239  --  304  APTT  --   --  36  --   LABPROT  --   --  14.0  --   INR  --   --  1.09  --   CREATININE 1.78* 1.77*  --  1.43*    Estimated Creatinine Clearance: 43 mL/min (A) (by C-G formula based on SCr of 1.43 mg/dL (H)).    Assessment: Patient is an 84 y.o M with hx bladder cancer presented to the ED on 12/30 with c/o SOB and weakness.  D-dimer was elevated on 1/1 with 1.07.  V/Q scan on 1/2 showed intermediate probability for PE.  To start heparin drip for suspected PE.  Today, 01/12/2017: - hgb low but stable, plts ok - scr elevated but trending down - per RN and pt's wife, no blood noted in urine   Goal of Therapy:  Heparin level 0.3-0.7 units/ml Monitor platelets by anticoagulation protocol: Yes   Plan:  - d/c heparin SQ - heparin 3000 units IV x1 bolus, then drip at 1500 units/hr (per [place] calculation) - check 8 hr heparin level - monitor for s/s bleeding  Franchon Ketterman P 01/12/2017,1:34 PM

## 2017-01-13 ENCOUNTER — Inpatient Hospital Stay (HOSPITAL_COMMUNITY): Payer: Medicare Other

## 2017-01-13 DIAGNOSIS — J9601 Acute respiratory failure with hypoxia: Secondary | ICD-10-CM

## 2017-01-13 DIAGNOSIS — I2609 Other pulmonary embolism with acute cor pulmonale: Secondary | ICD-10-CM

## 2017-01-13 DIAGNOSIS — N179 Acute kidney failure, unspecified: Secondary | ICD-10-CM

## 2017-01-13 DIAGNOSIS — I10 Essential (primary) hypertension: Secondary | ICD-10-CM

## 2017-01-13 DIAGNOSIS — J449 Chronic obstructive pulmonary disease, unspecified: Secondary | ICD-10-CM

## 2017-01-13 DIAGNOSIS — J441 Chronic obstructive pulmonary disease with (acute) exacerbation: Secondary | ICD-10-CM

## 2017-01-13 LAB — CBC
HEMATOCRIT: 28.2 % — AB (ref 39.0–52.0)
Hemoglobin: 8.1 g/dL — ABNORMAL LOW (ref 13.0–17.0)
MCH: 21.2 pg — AB (ref 26.0–34.0)
MCHC: 28.7 g/dL — ABNORMAL LOW (ref 30.0–36.0)
MCV: 73.8 fL — AB (ref 78.0–100.0)
Platelets: 312 10*3/uL (ref 150–400)
RBC: 3.82 MIL/uL — AB (ref 4.22–5.81)
RDW: 19.6 % — ABNORMAL HIGH (ref 11.5–15.5)
WBC: 9 10*3/uL (ref 4.0–10.5)

## 2017-01-13 LAB — BASIC METABOLIC PANEL
ANION GAP: 6 (ref 5–15)
BUN: 28 mg/dL — ABNORMAL HIGH (ref 6–20)
CHLORIDE: 102 mmol/L (ref 101–111)
CO2: 29 mmol/L (ref 22–32)
Calcium: 8.9 mg/dL (ref 8.9–10.3)
Creatinine, Ser: 1.25 mg/dL — ABNORMAL HIGH (ref 0.61–1.24)
GFR calc non Af Amer: 49 mL/min — ABNORMAL LOW (ref >60)
GFR, EST AFRICAN AMERICAN: 57 mL/min — AB (ref >60)
GLUCOSE: 126 mg/dL — AB (ref 65–99)
Potassium: 4.7 mmol/L (ref 3.5–5.1)
Sodium: 137 mmol/L (ref 135–145)

## 2017-01-13 LAB — HEPARIN LEVEL (UNFRACTIONATED)
Heparin Unfractionated: 0.26 IU/mL — ABNORMAL LOW (ref 0.30–0.70)
Heparin Unfractionated: <0.1 IU/mL — ABNORMAL LOW (ref 0.30–0.70)

## 2017-01-13 LAB — PROCALCITONIN: PROCALCITONIN: 0.18 ng/mL

## 2017-01-13 MED ORDER — IPRATROPIUM-ALBUTEROL 0.5-2.5 (3) MG/3ML IN SOLN
3.0000 mL | Freq: Three times a day (TID) | RESPIRATORY_TRACT | Status: DC
  Administered 2017-01-14: 3 mL via RESPIRATORY_TRACT
  Filled 2017-01-13: qty 3

## 2017-01-13 MED ORDER — HEPARIN (PORCINE) IN NACL 100-0.45 UNIT/ML-% IJ SOLN
1900.0000 [IU]/h | INTRAMUSCULAR | Status: DC
  Filled 2017-01-13 (×2): qty 250

## 2017-01-13 MED ORDER — PREDNISONE 20 MG PO TABS: 40.0000 mg | ORAL_TABLET | Freq: Every day | ORAL | Status: DC

## 2017-01-13 MED ORDER — ALBUTEROL SULFATE (2.5 MG/3ML) 0.083% IN NEBU: 2.5000 mg | INHALATION_SOLUTION | RESPIRATORY_TRACT | Status: DC | PRN

## 2017-01-13 MED ORDER — HEPARIN (PORCINE) IN NACL 100-0.45 UNIT/ML-% IJ SOLN
1700.0000 [IU]/h | INTRAMUSCULAR | Status: DC
  Administered 2017-01-13: 1700 [IU]/h via INTRAVENOUS
  Filled 2017-01-13 (×2): qty 250

## 2017-01-13 MED ORDER — HEPARIN BOLUS VIA INFUSION
2500.0000 [IU] | Freq: Once | INTRAVENOUS | Status: AC
  Administered 2017-01-13: 2500 [IU] via INTRAVENOUS
  Filled 2017-01-13: qty 2500

## 2017-01-13 MED ORDER — METHYLPREDNISOLONE SODIUM SUCC 40 MG IJ SOLR
40.0000 mg | Freq: Four times a day (QID) | INTRAMUSCULAR | Status: DC
  Administered 2017-01-13 – 2017-01-15 (×7): 40 mg via INTRAVENOUS
  Filled 2017-01-13 (×7): qty 1

## 2017-01-13 MED ORDER — IOPAMIDOL (ISOVUE-370) INJECTION 76%
100.0000 mL | Freq: Once | INTRAVENOUS | Status: AC | PRN
  Administered 2017-01-13: 80 mL via INTRAVENOUS

## 2017-01-13 MED ORDER — ARFORMOTEROL TARTRATE 15 MCG/2ML IN NEBU
15.0000 ug | INHALATION_SOLUTION | Freq: Two times a day (BID) | RESPIRATORY_TRACT | Status: DC
  Filled 2017-01-13 (×2): qty 2

## 2017-01-13 MED ORDER — IPRATROPIUM-ALBUTEROL 0.5-2.5 (3) MG/3ML IN SOLN
3.0000 mL | RESPIRATORY_TRACT | Status: DC
  Administered 2017-01-13 (×2): 3 mL via RESPIRATORY_TRACT
  Filled 2017-01-13 (×2): qty 3

## 2017-01-13 MED ORDER — IOPAMIDOL (ISOVUE-300) INJECTION 61%
INTRAVENOUS | Status: AC
  Filled 2017-01-13: qty 75

## 2017-01-13 MED ORDER — IOPAMIDOL (ISOVUE-370) INJECTION 76%
INTRAVENOUS | Status: AC
  Filled 2017-01-13: qty 100

## 2017-01-13 MED ORDER — SODIUM CHLORIDE 0.9 % IV BOLUS (SEPSIS)
1000.0000 mL | Freq: Once | INTRAVENOUS | Status: AC
  Administered 2017-01-13: 1000 mL via INTRAVENOUS

## 2017-01-13 MED ORDER — FUROSEMIDE 10 MG/ML IJ SOLN
40.0000 mg | Freq: Once | INTRAMUSCULAR | Status: AC
  Administered 2017-01-13: 40 mg via INTRAVENOUS
  Filled 2017-01-13: qty 4

## 2017-01-13 NOTE — Progress Notes (Signed)
Physical Therapy Treatment Patient Details Name: Jose ZELMAN Sr. MRN: [id number] DOB: [birth year] Today's Date: 01/13/2017    History of Present Illness 84 year old male with hypertension,chronic diastolic CHF, CKD stage 3, bladder cancer who presented to ED with general malaise, increased SOB, intermittent fever/chills and increase fatigue. Dx of sepsis, UTI, acute on chronic renal failure.    PT Comments    The patient is eager to ambulate. ambualted x 160' on RA with lowest oxygenation at 86% near end. Replaced Oxygen at 2 liters with return to 95% after ~1 minute and pursed lip breaths. HR 91 max. continue PT for oxygen needs at D[facility].     Follow Up Recommendations  Home health PT     Equipment Recommendations  Rolling walker with 5" wheels;Other (comment)    Recommendations for Other Services       Precautions / Restrictions Precautions Precautions: Fall Precaution Comments: pt denies h/o falls in past 1 year but was mildly unsteady today; monitor O2    Mobility  Bed Mobility               General bed mobility comments: in recliner  Transfers Overall transfer level: Needs assistance Equipment used: Rolling walker (2 wheeled) Transfers: Sit to/from Stand           General transfer comment: VCs hand placement, increased time/effort to stand from recliner using armrests, steady assist  upon standing  Ambulation/Gait Ambulation/Gait assistance: Min guard Ambulation Distance (Feet): 160 Feet Assistive device: Rolling walker (2 wheeled) Gait Pattern/deviations: Step-through pattern;Decreased stride length     General Gait Details: steady with RW, SaO2 86% on room air, , 2/4 dyspnea, distance limited by dyspnea, encouraged Pursd lip breaths   Stairs            Wheelchair Mobility    Modified Rankin (Stroke Patients Only)       Balance     Sitting balance-Leahy Scale: Good     Standing balance support: No upper extremity supported;During  functional activity Standing balance-Leahy Scale: Fair                              Cognition Arousal/Alertness: Awake/alert                                            Exercises      General Comments        Pertinent Vitals/Pain Pain Assessment: No/denies pain    Home Living                      Prior Function            PT Goals (current goals can now be found in the care plan section) Progress towards PT goals: Progressing toward goals    Frequency    Min 3X/week      PT Plan Current plan remains appropriate;Discharge plan needs to be updated    Co-evaluation              AM-PAC PT "6 Clicks" Daily Activity  Outcome Measure  Difficulty turning over in bed (including adjusting bedclothes, sheets and blankets)?: None Difficulty moving from lying on back to sitting on the side of the bed? : None Difficulty sitting down on and standing up from a chair with arms (e.g., wheelchair,  bedside commode, etc,.)?: A Little Help needed moving to and from a bed to chair (including a wheelchair)?: A Little Help needed walking in hospital room?: A Little Help needed climbing 3-5 steps with a railing? : A Little 6 Click Score: 20    End of Session Equipment Utilized During Treatment: Gait belt Activity Tolerance: Patient limited by fatigue Patient left: in chair;with call bell/phone within reach;with family/visitor present Nurse Communication: Mobility status PT Visit Diagnosis: Difficulty in walking, not elsewhere classified (R26.2);Unsteadiness on feet (R26.81)     Time: 9211-9417 PT Time Calculation (min) (ACUTE ONLY): 19 min  Charges:  $Gait Training: 8-22 mins                    G CodesTresa Endo PT [phone]    [name] 01/13/2017, 3:45 PM

## 2017-01-13 NOTE — Progress Notes (Signed)
ANTICOAGULATION CONSULT NOTE   Pharmacy Consult for heparin  Indication: suspected new pulmonary embolus  No Known Allergies  Patient Measurements: Height: 6\' 4"  (193 cm) Weight: 203 lb 0.7 oz (92.1 kg) IBW/kg (Calculated) : 86.8 Heparin Dosing Weight: 92 kg  Vital Signs: Temp: 98.2 F (36.8 C) (01/02 1955) Temp Source: Oral (01/02 1955) BP: 152/60 (01/02 1955) Pulse Rate: 74 (01/02 1955)  Labs: Recent Labs    01/10/17 0416 01/11/17 0410 01/11/17 2154 01/12/17 0414 01/12/17 2152  HGB 8.1* 8.1*  --  8.4*  --   HCT 27.5* 27.3*  --  29.4*  --   PLT 191 239  --  304  --   APTT  --   --  36  --   --   LABPROT  --   --  14.0  --   --   INR  --   --  1.09  --   --   HEPARINUNFRC  --   --   --   --  0.21*  CREATININE 1.78* 1.77*  --  1.43*  --     Estimated Creatinine Clearance: 43 mL/min (A) (by C-G formula based on SCr of 1.43 mg/dL (H)).    Assessment: Patient is an 84 y.o M with hx bladder cancer presented to the ED on 12/30 with c/o SOB and weakness.  D-dimer was elevated on 1/1 with 1.07.  V/Q scan on 1/2 showed intermediate probability for PE.  To start heparin drip for suspected PE.  Today, 01/13/2017: - hgb low but stable, plts ok - scr elevated but trending down - per RN and pt's wife, no blood noted in urine  01/13/2017 12:05 AM First heparin level subtherapeutic at 0.21 units/ml with infusion at 1500 units/hr. No bleeding/complications reported.    Goal of Therapy:  Heparin level 0.3-0.7 units/ml Monitor platelets by anticoagulation protocol: Yes   Plan:  - heparin 2500 units IV x1 bolus, then increase infusion to 1700 units/hr  - check 8 hr heparin level - monitor for s/s bleeding  Hershal Coria 01/13/2017,12:05 AM

## 2017-01-13 NOTE — Progress Notes (Signed)
[place] for heparin  Indication: suspected new pulmonary embolus  No Known Allergies  Patient Measurements: Height: 6\' 4"  (193 cm) Weight: 203 lb 0.7 oz (92.1 kg) IBW/kg (Calculated) : 86.8 Heparin Dosing Weight: 92 kg  Vital Signs: Temp: 98 F (36.7 C) (01/03 0502) Temp Source: Oral (01/03 0502) BP: 155/54 (01/03 0502) Pulse Rate: 74 (01/03 0502)  Labs: Recent Labs    01/11/17 0410 01/11/17 2154 01/12/17 0414 01/12/17 2152 01/13/17 0403 01/13/17 0753  HGB 8.1*  --  8.4*  --  8.1*  --   HCT 27.3*  --  29.4*  --  28.2*  --   PLT 239  --  304  --  312  --   APTT  --  36  --   --   --   --   LABPROT  --  14.0  --   --   --   --   INR  --  1.09  --   --   --   --   HEPARINUNFRC  --   --   --  0.21*  --  0.26*  CREATININE 1.77*  --  1.43*  --   --   --     Estimated Creatinine Clearance: 43 mL/min (A) (by C-G formula based on SCr of 1.43 mg/dL (H)).    Assessment: Patient is an 84 y.o M with hx bladder cancer presented to the ED on 12/30 with c/o SOB and weakness.  D-dimer was elevated on 1/1 with 1.07.  V/Q scan on 1/2 showed intermediate probability for PE.  LE doppler on 1/2 neg for DVT. Patient's currently on heparin drip for suspected PE.   Today, 01/13/2017: - heparin level remains sub-therapeutic at 0.26 despite rate increased to 1700 units/hr.  Per RN, no issues with IV line - hgb low but stable, plts ok - no new bleeding documented   Goal of Therapy:  Heparin level 0.3-0.7 units/ml Monitor platelets by anticoagulation protocol: Yes   Plan:  - Increase heparin drip to 1900 units/hr - check 8 hr heparin level - monitor for s/s bleeding  Gwen Sarvis P 01/13/2017,8:28 AM

## 2017-01-13 NOTE — Consult Note (Signed)
Name: Jose BULNES Sr. MRN: [id number] DOB: Mar 07, 1925    ADMISSION DATE:  01/09/2017 CONSULTATION DATE:  1/3   REFERRING MD :  Elon Jester  CHIEF COMPLAINT:  Hypoxia   BRIEF PATIENT DESCRIPTION:    SIGNIFICANT EVENTS    STUDIES:  CT chest 1/3: no [email or web address] right pleural effusion. Some atx   HISTORY OF PRESENT ILLNESS:   84 year old male w/ sig h/o HTN, chronic diastolic HF, CKD and COPD (CAT score 9 FEV12 53% predicted) & WHO class II-III pulmonary hypertension.  Most recently in 2018 he has been seeing Dr. Vaughan Browner.  He he initially underwent pulmonary function test that suggested restriction with mixed obstruction and a significantly lower DLCO in the 30s.  He then underwent high-resolution CT scan of the chest that did not show any interstitial lung disease but he did have evidence of pulmonary hypertension and moderate centrilobular emphysema and some basal atelectasis.  He subsequently had a home sleep study that showed significant obstructive sleep apnea with apnea hypopnea index of 27 events per hour.  Echocardiogram showed severe pulmonary hypertension and grade 2 diastolic dysfunction.  He has been started on CPAP therapy.  CT angiogram ruled out pulmonary embolism.  Flu PCR is negative.  He has not had Multiplex respiratory virus panel PCR admitted on 12/30 w/ cc: increased fatigue, intermittent fever and worsening shortness of breath..  He has been hypoxemic requiring 2 L oxygen using a walker on room air with physical therapy since admission he is slowly getting better.  In fact today he walked and walk 1 length of the hallway before he desaturated to 86% on room air.  [In 2014 in the office he did not desaturate with exertion].  Of note he has been reporting worsening dyspnea for the last 2 months and this was prior to initiation of CPAP therapy.   PAST MEDICAL HISTORY :   has a past medical history of Arthritis, Bifascicular block, Bladder cancer ([place]), BPH (benign prostatic  hyperplasia), Chronic constipation, Chronic restrictive lung disease, COPD mixed type ([place]), Coronary artery disease (CARDIOLOGIST- DR Thompson Grayer), GERD (gastroesophageal reflux disease), History of basal cell carcinoma excision (BILATERAL ARMS), History of colon polyps, thyroid cancer, Hyperlipidemia, Hypertension, Hypothyroidism, Hypothyroidism, postsurgical, Nocturia, Right bundle branch block, Sigmoid diverticulosis, Wears glasses, and Wears glasses.  has a past surgical history that includes Total thyroidectomy (1991); Cataract extraction w/ intraocular lens  implant, bilateral; Cystoscopy with biopsy (12/16/2010); Transurethral resection of bladder tumor (12/16/2010); Cystoscopy/retrograde/ureteroscopy (12/16/2010); Eye surgery; Green light laser turp (transurethral resection of prostate (11-02-2011); Cardiovascular stress test (08-17-2010   dr allred); Cystoscopy w/ retrogrades (Bilateral, 10/08/2014); Knee arthroscopy (Left, 11/  2016); Cystoscopy with biopsy (N/A, 05/20/2015); Cystoscopy with biopsy (N/A, 06/18/2016); and Cystoscopy w/ retrogrades (Bilateral, 06/18/2016). Prior to Admission medications   Medication Sig Start Date End Date Taking? Authorizing Provider  aspirin EC 81 MG tablet Take 81 mg by mouth daily.   Yes [provider]  atorvastatin (LIPITOR) 20 MG tablet Take 20 mg by mouth daily after breakfast.    Yes [provider]  iron polysaccharides (NIFEREX) 150 MG capsule Take 150 mg by mouth daily.   Yes [provider]  levothyroxine (SYNTHROID, LEVOTHROID) 175 MCG tablet Take 175 mcg by mouth daily after breakfast.    Yes [provider]  losartan (COZAAR) 50 MG tablet Take 1 tablet (50 mg total) by mouth daily. 07/03/14  Yes Allred, Jeneen Rinks, MD  pantoprazole (PROTONIX) 40 MG tablet Take 1 tablet (40 mg total)  by mouth daily. Patient taking differently: Take 40 mg by mouth daily after breakfast.  07/03/14  Yes Allred, Jeneen Rinks, MD  polyethylene glycol powder  (GLYCOLAX/MIRALAX) powder Take 17 g by mouth daily as needed (for constipation).   Yes [provider]  testosterone enanthate (DELATESTRYL) 200 MG/ML injection Inject 200 mg into the muscle every 28 (twenty-eight) days. For IM use only   Yes [provider]  Tiotropium Bromide-Olodaterol (STIOLTO RESPIMAT) 2.5-2.5 MCG/ACT AERS Inhale 2 puffs into the lungs daily. 09/30/16  Yes Mannam, Praveen, MD  triamterene-hydrochlorothiazide (MAXZIDE-25) 37.5-25 MG tablet Take 1 tablet by mouth daily. 03/10/16  Yes [provider]  vitamin B-12 (CYANOCOBALAMIN) 1000 MCG tablet Take 1,000 mcg by mouth 3 (three) times a week.   Yes [provider]  Vitamin D, Ergocalciferol, (DRISDOL) 50000 units CAPS capsule Take 50,000 Units by mouth every Monday.    Yes [provider]  nitroGLYCERIN (NITROSTAT) 0.4 MG SL tablet Place 1 tablet (0.4 mg total) under the tongue every 5 (five) minutes as needed for chest pain. 07/03/14   Thompson Grayer, MD   No Known Allergies  FAMILY HISTORY:  family history includes Diabetes in his father; Lung cancer in his brother; Thyroid cancer in his unknown relative. SOCIAL HISTORY:  reports that he quit smoking about 35 years ago. His smoking use included cigarettes. He has a 70.00 pack-year smoking history. he has never used smokeless tobacco. He reports that he drinks alcohol. He reports that he does not use drugs.  REVIEW OF SYSTEMS:   Constitutional: Negative for fever, chills, weight loss, malaise/fatigue and diaphoresis.  HENT: Negative for hearing loss, ear pain, nosebleeds, congestion, sore throat, neck pain, tinnitus and ear discharge.   Eyes: Negative for blurred vision, double vision, photophobia, pain, discharge and redness.  Respiratory: Negative for cough, hemoptysis, sputum production, shortness of breath, wheezing and stridor.   Cardiovascular: Negative for chest pain, palpitations, orthopnea, claudication, leg swelling and PND.    Gastrointestinal: Negative for heartburn, nausea, vomiting, abdominal pain, diarrhea, constipation, blood in stool and melena.  Genitourinary: Negative for dysuria, urgency, frequency, hematuria and flank pain.  Musculoskeletal: Negative for myalgias, back pain, joint pain and falls.  Skin: Negative for itching and rash.  Neurological: Negative for dizziness, tingling, tremors, sensory change, speech change, focal weakness, seizures, loss of consciousness, weakness and headaches.  Endo/Heme/Allergies: Negative for environmental allergies and polydipsia. Does not bruise/bleed easily.    VITAL SIGNS: Temp:  [97.7 F (36.5 C)-98.2 F (36.8 C)] 97.7 F (36.5 C) (01/03 1336) Pulse Rate:  [74] 74 (01/03 1336) Resp:  [16-17] 16 (01/03 1336) BP: (152-161)/(54-69) 161/69 (01/03 1336) SpO2:  [93 %-97 %] 97 % (01/03 1336)  PHYSICAL EXAMINATION: General: Tall male who looks younger than his age but still looks a little bit deconditioned Neuro: Alert and oriented x3 cognitively intact and sharp HEENT: No neck nodes.  No elevated JVP Cardiovascular: Normal heart sounds Lungs: Bilateral bibasilar crackles present raising the specter of interstitial lung disease which was ruled out and high-resolution CT chest Abdomen: Soft nontender  Musculoskeletal: Mild venous stasis edema Skin: Appears intact  Recent Labs  Lab 01/11/17 0410 01/12/17 0414 01/13/17 0753  NA 134* 137 137  K 4.4 4.6 4.7  CL 102 102 102  CO2 26 28 29   BUN 49* 38* 28*  CREATININE 1.77* 1.43* 1.25*  GLUCOSE 106* 96 126*   Recent Labs  Lab 01/11/17 0410 01/12/17 0414 01/13/17 0403  HGB 8.1* 8.4* 8.1*  HCT 27.3* 29.4* 28.2*  WBC 12.8* 8.3 9.0  PLT 239 304 312   Ct Chest Wo Contrast  Result Date: 01/11/2017 CLINICAL DATA:  General malaise and shortness of breath. Admitted for UTI. EXAM: CT CHEST WITHOUT CONTRAST TECHNIQUE: Multidetector CT imaging of the chest was performed following the standard protocol without IV  contrast. COMPARISON:  CT chest dated September of 27, 2018. FINDINGS: Cardiovascular: No significant vascular findings. Normal heart size. No pericardial effusion. Normal caliber thoracic aorta. Coronary, aortic arch, and branch vessel atherosclerotic vascular disease. Aberrant origin of the right subclavian artery. The main pulmonary artery remains dilated, measuring up to 3.7 cm. Mediastinum/Nodes: No enlarged mediastinal or axillary lymph nodes. The thyroid gland is surgically absent. Small secretions within the distal trachea. The esophagus demonstrates no significant findings. Lungs/Pleura: Trace right pleural effusion with adjacent mild right basilar atelectasis. Very mild interlobular septal thickening, predominantly in the bilateral upper lobes. Mild centrilobular emphysema. Scattered calcified granulomas again noted. The previously described ground-glass nodule in the right upper lobe and subpleural peripheral nodule in the left lower lobe are no longer identified. Unchanged 3 mm solid nodule in the lingula (series 5, image 106). Stable subpleural 4 mm nodule in the peripheral right upper lobe (series 5, image 36). No pneumothorax or consolidation. Upper Abdomen: No acute abnormality. Musculoskeletal: Bilateral gynecomastia. No acute or significant osseous findings. IMPRESSION: 1. Trace right pleural effusion with adjacent mild bibasilar atelectasis. No consolidation. 2. Resolution of previously seen right upper lobe ground-glass nodule and subpleural left lower lobe nodule. Stable 4 mm subpleural right upper lobe pulmonary nodule. 3. Stable dilatation of the main pulmonary artery, suggesting pulmonary arterial hypertension. 4.  Emphysema (ICD10-J43.9). 5.  Aortic atherosclerosis (ICD10-I70.0). Electronically Signed   By: Titus Dubin M.D.   On: 01/11/2017 17:54   Ct Angio Chest Pe W Or Wo Contrast  Result Date: 01/13/2017 CLINICAL DATA:  Shortness of breath, elevated D-dimer level. EXAM: CT  ANGIOGRAPHY CHEST WITH CONTRAST TECHNIQUE: Multidetector CT imaging of the chest was performed using the standard protocol during bolus administration of intravenous contrast. Multiplanar CT image reconstructions and MIPs were obtained to evaluate the vascular anatomy. CONTRAST:  63mL ISOVUE-370 IOPAMIDOL (ISOVUE-370) INJECTION 76% COMPARISON:  CT scan of January 11, 2017. FINDINGS: Cardiovascular: Satisfactory opacification of the pulmonary arteries to the segmental level. No evidence of pulmonary embolism. Normal heart size. No pericardial effusion. Atherosclerosis of thoracic aorta is noted without aneurysm or dissection. Mediastinum/Nodes: Status post thyroidectomy. The esophagus is unremarkable. No adenopathy is noted. Lungs/Pleura: No pneumothorax is noted. Mild right pleural effusion is noted with adjacent subsegmental atelectasis. Upper Abdomen: No acute abnormality. Musculoskeletal: No chest wall abnormality. No acute or significant osseous findings. Review of the MIP images confirms the above findings. IMPRESSION: No definite evidence of pulmonary embolus. Mild right pleural effusion with adjacent subsegmental atelectasis. Aortic Atherosclerosis (ICD10-I70.0). Electronically Signed   By: Marijo Conception, M.D.   On: 01/13/2017 12:17   Nm Pulmonary Perf And Vent  Result Date: 01/12/2017 CLINICAL DATA:  Inpatient. Worsening dyspnea. Positive D-dimer. History of bladder cancer and thyroid cancer. EXAM: NUCLEAR MEDICINE VENTILATION - PERFUSION LUNG SCAN TECHNIQUE: Ventilation images were obtained in multiple projections using inhaled aerosol Tc-40m DTPA. Perfusion images were obtained in multiple projections after intravenous injection of Tc-36m MAA. RADIOPHARMACEUTICALS:  28.8 mCi Technetium-87m DTPA aerosol inhalation and 4.4 mCi Technetium-67m MAA IV COMPARISON:  01/11/2017 chest CT.  01/10/2017 chest radiograph. FINDINGS: Ventilation: Heterogeneous ventilation of both lungs with central airway radiotracer  accumulation. Perfusion: Large matched segmental perfusion defect  in the right lower lobe. Subsegmental matched perfusion defects in the right middle lobe and lingula. IMPRESSION: Intermediate probability for pulmonary embolism (20- 79%) by PIOPED II. Electronically Signed   By: Ilona Sorrel M.D.   On: 01/12/2017 12:53    ASSESSMENT / PLAN:  Sounds like he has had a COPD exacerbation.  He has severe pulmonary hypertension on account of his sleep apnea and emphysema.  He has been started on CPAP therapy for this.  Because of the COPD exacerbation he might be having mild decompensated cor pulmonale that can explain the pleural effusion on the CT chest and his edema.  Overall his appears to be improving  Plan -Check Multiplex respiratory virus panel to ensure no other respiratory viruses causing COPD exacerbation -Treat for COPD exacerbation with antibiotic steroids and bronchodilators nebulizer -Gentle diuresis only -Monitor oxygen status  CCM will continue to follow   Dr. Brand Males, M.D., [facility].C.P Pulmonary and Critical Care Medicine Staff Physician, [place] Director - Interstitial Lung Disease  Program  Pulmonary [place] at [place], [place], [address]  Pager: [phone], If no answer or between  15:00h - 7:00h: call [phone] Telephone: [phone]

## 2017-01-13 NOTE — Care Management Important Message (Signed)
Important Message  Patient Details  Name: Jose HYUN Sr. MRN: [id number] Date of Birth: [birth year]   Medicare Important Message Given:  Yes    Kerin Salen 01/13/2017, 11:36 AMImportant Message  Patient Details  Name: Jose BURGETT Sr. MRN: [id number] Date of Birth: [birth year]   Medicare Important Message Given:  Yes    Kerin Salen 01/13/2017, 11:36 AM

## 2017-01-13 NOTE — Progress Notes (Signed)
PROGRESS NOTE [facility]   Jose PURSLEY Sr.   TDD:[id number] DOB: 05-Jun-1925  DOA: 01/09/2017 PCP: [name], MD   Brief Narrative:  Jose Burly Buccellato Sr. is a 84 year old male with hypertension,chronic diastolic CHF, CKD stage 3, bladder cancer who presented to ED withgeneral malaise, increased SOB, intermittent fever/chills and increase fatigue for the last 2 days. CXR without infiltrates, patient with hyponatremia, acute on chronic renal failure, UA suggesting infection, elevated WBC's and lactic acidosis. Cultures taken, antibiotics initiated.  Subjective: Patient seen and examined, feels better. Has not ambulate much.    Assessment & Plan:   Principal Problem:   SIRS (systemic inflammatory response syndrome) (HCC) Active Problems:   HTN (hypertension)   Hyperlipidemia   COPD GOLD II   GERD (gastroesophageal reflux disease)   Hypothyroidism   Shortness of breath   Acute on chronic renal failure (HCC)   Urinary tract infection with hematuria  Severe sepsis due to UTI, Pneumonia ruled out  - Severe sepsis criteria met on admission withlow gradefever, tachypnea, hypotension, leukocytosisand lactic acidosis in addition to suspected source of infection: UTI - Lactic acid is2.71; repeat level is WNL - UA grossly abnormal, urine culture performed after abx - insignificant growth  - Blood cultures NGT x 3  - Continue Rocephin for now, can d/c azithro   Acute Hypoxic Resp Failure  COPD  Pulm Hypertension - PE ruled out, ? dCHF - Continues to have high O2 requirements, D dimer positive, V/Q scan intermediate risk, CTA negative for PE, Doppler US negative for DVT   - d/c heparin ggt  - Continue to wean O2 as tolerated  - PCCM consult  - Will add prednisone and Brovana, continue Pulmicort and Albuterol   - Check O2 Sat with ambulation  - Given small pleural effusion and mild LE edema will give a trial of Lasix   UTI  Abnormal UA with hematuria - UA  showed large leukocytes as well as RBC's - Urine cultures insignificant growth although collected after abx  - Continue rocephin, will treat for 5 days   HTN (hypertension), essential - BP remains stable  - Losartan on hold due to AKI   Acute renal failure superimposed on CKD stage 3 - Baseline Cr fluctuates, recently 1.2 - Cr 2.09 on this admission likely elevated due to acute infectious process - CR improved to 1.2 - Continue gentle hydration post contrast  - Monitor Cr in AM   Hypothyroidism - Continue Synthroid  Hand pain and numbness  Bilateral numbness and pain, worse in AM, c/w arthritic pain and possible peripheral neuropathy.  Increase gabapentin, may need outpatient EMG for ? Carpal tunnel syndrome   DVT prophylaxis: Heparin ggt  Code Status: Full code  Family Communication: Wife at bedside  Disposition Plan: Home when medically stable   Consultants:   None   Procedures:   V/Q scan   Antimicrobials: Anti-infectives (From admission, onward)   Start     Dose/Rate Route Frequency Ordered Stop   01/11/17 1200  azithromycin (ZITHROMAX) tablet 500 mg     500 mg Oral Every 24 hours 01/11/17 0726     01/10/17 1200  azithromycin (ZITHROMAX) 500 mg in dextrose 5 % 250 mL IVPB  Status:  Discontinued     500 mg 250 mL/hr over 60 Minutes Intravenous Every 24 hours 01/09/17 1940 01/11/17 0726   01/10/17 1100  cefTRIAXone (ROCEPHIN) 1 g in dextrose 5 % 50 mL IVPB     1 g 100  mL/hr over 30 Minutes Intravenous Every 24 hours 01/09/17 1940     01/09/17 1030  cefTRIAXone (ROCEPHIN) 1 g in dextrose 5 % 50 mL IVPB     1 g 100 mL/hr over 30 Minutes Intravenous  Once 01/09/17 1019 01/09/17 1221   01/09/17 1030  azithromycin (ZITHROMAX) 500 mg in dextrose 5 % 250 mL IVPB     500 mg 250 mL/hr over 60 Minutes Intravenous  Once 01/09/17 1019 01/09/17 1340       Objective: Vitals:   01/12/17 1955 01/12/17 2013 01/13/17 0502 01/13/17 1057  BP: (!) 152/60  (!) 155/54     Pulse: 74  74   Resp: 16  17   Temp: 98.2 F (36.8 C)  98 F (36.7 C)   TempSrc: Oral  Oral   SpO2: 93% 95% 97% 96%  Weight:      Height:        Intake/Output Summary (Last 24 hours) at 01/13/2017 1315 Last data filed at 01/13/2017 0800 Gross per 24 hour  Intake 1450.36 ml  Output 200 ml  Net 1250.36 ml   Filed Weights   01/09/17 0945 01/09/17 1519  Weight: 89.8 kg (198 lb) 92.1 kg (203 lb 0.7 oz)    Examination:  General: Pt is alert, awake, not in acute distress Cardiovascular: RRR, S1/S2  Respiratory: Breath sound diminished, mild crackles at the bases.  Abdominal: Soft, NT, ND, bowel sounds + Extremities: Mild LE edema b/l    Data Reviewed: I have personally reviewed following labs and imaging studies  CBC: Recent Labs  Lab 01/09/17 1011 01/10/17 0416 01/11/17 0410 01/12/17 0414 01/13/17 0403  WBC 31.2* 19.1* 12.8* 8.3 9.0  NEUTROABS 28.4*  --   --   --   --   HGB 10.2* 8.1* 8.1* 8.4* 8.1*  HCT 33.3* 27.5* 27.3* 29.4* 28.2*  MCV 71.9* 71.8* 73.0* 73.7* 73.8*  PLT 229 191 239 304 662   Basic Metabolic Panel: Recent Labs  Lab 01/09/17 1011 01/09/17 2124 01/10/17 0416 01/11/17 0410 01/12/17 0414 01/13/17 0753  NA 130*  --  134* 134* 137 137  K 4.4  --  4.2 4.4 4.6 4.7  CL 97*  --  102 102 102 102  CO2 22  --  _0 GLUCOSE 113*  --  101* 106* 96 126*  BUN 56*  --  54* 49* 38* 28*  CREATININE 2.09*  --  1.78* 1.77* 1.43* 1.25*  CALCIUM 8.8*  --  8.2* 8.4* 8.6* 8.9  MG  --  1.7  --   --   --   --   PHOS  --  2.9  --   --   --   --    GFR: Estimated Creatinine Clearance: 49.2 mL/min (A) (by C-G formula based on SCr of 1.25 mg/dL (H)). Liver Function Tests: Recent Labs  Lab 01/09/17 1011 01/12/17 0414  AST 21 18  ALT 16* 24  ALKPHOS 72 74  BILITOT 1.3* 0.6  PROT 7.0 6.1*  ALBUMIN 3.6 2.9*   No results for input(s): LIPASE, AMYLASE in the last 168 hours. No results for input(s): AMMONIA in the last 168 hours. Coagulation  Profile: Recent Labs  Lab 01/11/17 2154  INR 1.09   Cardiac Enzymes: No results for input(s): CKTOTAL, CKMB, CKMBINDEX, TROPONINI in the last 168 hours. BNP (last 3 results) No results for input(s): PROBNP in the last 8760 hours. HbA1C: No results for input(s): HGBA1C in the last 72 hours. CBG:  No results for input(s): GLUCAP in the last 168 hours. Lipid Profile: No results for input(s): CHOL, HDL, LDLCALC, TRIG, CHOLHDL, LDLDIRECT in the last 72 hours. Thyroid Function Tests: No results for input(s): TSH, T4TOTAL, FREET4, T3FREE, THYROIDAB in the last 72 hours. Anemia Panel: No results for input(s): VITAMINB12, FOLATE, FERRITIN, TIBC, IRON, RETICCTPCT in the last 72 hours. Sepsis Labs: Recent Labs  Lab 01/09/17 0949 01/09/17 1211  LATICACIDVEN 2.71* 1.40    Recent Results (from the past 240 hour(s))  Blood Culture (routine x 2)     Status: None (Preliminary result)   Collection Time: 01/09/17 10:15 AM  Result Value Ref Range Status   Specimen Description BLOOD RIGHT FOREARM  Final   Special Requests   Final    BOTTLES DRAWN AEROBIC AND ANAEROBIC Blood Culture adequate volume   Culture   Final    NO GROWTH 4 DAYS Performed at [facility], [place] [address] [place], [place] [address]    Report Status PENDING  Incomplete  Blood Culture (routine x 2)     Status: None (Preliminary result)   Collection Time: 01/09/17 10:20 AM  Result Value Ref Range Status   Specimen Description BLOOD LEFT ANTECUBITAL  Final   Special Requests   Final    BOTTLES DRAWN AEROBIC AND ANAEROBIC Blood Culture adequate volume   Culture   Final    NO GROWTH 4 DAYS Performed at [facility], [place] [address], [place], [place] [address]    Report Status PENDING  Incomplete  Culture, Urine     Status: Abnormal   Collection Time: 01/10/17  6:39 PM  Result Value Ref Range Status   Specimen Description URINE, RANDOM  Final   Special Requests NONE  Final   Culture (A)  Final    <10,000  COLONIES/mL INSIGNIFICANT GROWTH Performed at [facility], [place] [address], [place], [place] [address]    Report Status 01/12/2017 FINAL  Final      Radiology Studies: Ct Chest Wo Contrast  Result Date: 01/11/2017 CLINICAL DATA:  General malaise and shortness of breath. Admitted for UTI. EXAM: CT CHEST WITHOUT CONTRAST TECHNIQUE: Multidetector CT imaging of the chest was performed following the standard protocol without IV contrast. COMPARISON:  CT chest dated September of 27, 2018. FINDINGS: Cardiovascular: No significant vascular findings. Normal heart size. No pericardial effusion. Normal caliber thoracic aorta. Coronary, aortic arch, and branch vessel atherosclerotic vascular disease. Aberrant origin of the right subclavian artery. The main pulmonary artery remains dilated, measuring up to 3.7 cm. Mediastinum/Nodes: No enlarged mediastinal or axillary lymph nodes. The thyroid gland is surgically absent. Small secretions within the distal trachea. The esophagus demonstrates no significant findings. Lungs/Pleura: Trace right pleural effusion with adjacent mild right basilar atelectasis. Very mild interlobular septal thickening, predominantly in the bilateral upper lobes. Mild centrilobular emphysema. Scattered calcified granulomas again noted. The previously described ground-glass nodule in the right upper lobe and subpleural peripheral nodule in the left lower lobe are no longer identified. Unchanged 3 mm solid nodule in the lingula (series 5, image 106). Stable subpleural 4 mm nodule in the peripheral right upper lobe (series 5, image 36). No pneumothorax or consolidation. Upper Abdomen: No acute abnormality. Musculoskeletal: Bilateral gynecomastia. No acute or significant osseous findings. IMPRESSION: 1. Trace right pleural effusion with adjacent mild bibasilar atelectasis. No consolidation. 2. Resolution of previously seen right upper lobe ground-glass nodule and subpleural left lower lobe  nodule. Stable 4 mm subpleural right upper lobe pulmonary nodule.  3. Stable dilatation of the main pulmonary artery, suggesting pulmonary arterial hypertension. 4.  Emphysema (ICD10-J43.9). 5.  Aortic atherosclerosis (ICD10-I70.0). Electronically Signed   By: Titus Dubin M.D.   On: 01/11/2017 17:54   Ct Angio Chest Pe W Or Wo Contrast  Result Date: 01/13/2017 CLINICAL DATA:  Shortness of breath, elevated D-dimer level. EXAM: CT ANGIOGRAPHY CHEST WITH CONTRAST TECHNIQUE: Multidetector CT imaging of the chest was performed using the standard protocol during bolus administration of intravenous contrast. Multiplanar CT image reconstructions and MIPs were obtained to evaluate the vascular anatomy. CONTRAST:  36m ISOVUE-370 IOPAMIDOL (ISOVUE-370) INJECTION 76% COMPARISON:  CT scan of January 11, 2017. FINDINGS: Cardiovascular: Satisfactory opacification of the pulmonary arteries to the segmental level. No evidence of pulmonary embolism. Normal heart size. No pericardial effusion. Atherosclerosis of thoracic aorta is noted without aneurysm or dissection. Mediastinum/Nodes: Status post thyroidectomy. The esophagus is unremarkable. No adenopathy is noted. Lungs/Pleura: No pneumothorax is noted. Mild right pleural effusion is noted with adjacent subsegmental atelectasis. Upper Abdomen: No acute abnormality. Musculoskeletal: No chest wall abnormality. No acute or significant osseous findings. Review of the MIP images confirms the above findings. IMPRESSION: No definite evidence of pulmonary embolus. Mild right pleural effusion with adjacent subsegmental atelectasis. Aortic Atherosclerosis (ICD10-I70.0). Electronically Signed   By: JMarijo Conception M.D.   On: 01/13/2017 12:17   Nm Pulmonary Perf And Vent  Result Date: 01/12/2017 CLINICAL DATA:  Inpatient. Worsening dyspnea. Positive D-dimer. History of bladder cancer and thyroid cancer. EXAM: NUCLEAR MEDICINE VENTILATION - PERFUSION LUNG SCAN TECHNIQUE: Ventilation  images were obtained in multiple projections using inhaled aerosol Tc-998mTPA. Perfusion images were obtained in multiple projections after intravenous injection of Tc-9946mA. RADIOPHARMACEUTICALS:  28.8 mCi Technetium-40m47mA aerosol inhalation and 4.4 mCi Technetium-40m 18mIV COMPARISON:  01/11/2017 chest CT.  01/10/2017 chest radiograph. FINDINGS: Ventilation: Heterogeneous ventilation of both lungs with central airway radiotracer accumulation. Perfusion: Large matched segmental perfusion defect in the right lower lobe. Subsegmental matched perfusion defects in the right middle lobe and lingula. IMPRESSION: Intermediate probability for pulmonary embolism (20- 79%) by PIOPED II. Electronically Signed   By: JasonIlona Sorrel   On: 01/12/2017 12:53      Scheduled Meds: . aspirin EC  81 mg Oral Daily  . atorvastatin  20 mg Oral QPC breakfast  . azithromycin  500 mg Oral Q24H  . budesonide (PULMICORT) nebulizer solution  0.5 mg Nebulization BID  . dextromethorphan-guaiFENesin  1 tablet Oral BID  . gabapentin  100 mg Oral TID  . iopamidol      . iopamidol      . iron polysaccharides  150 mg Oral Daily  . levothyroxine  175 mcg Oral QAC breakfast  . pantoprazole  40 mg Oral Daily  . vitamin B-12  1,000 mcg Oral Once per day on Mon Wed Fri  . Vitamin D (Ergocalciferol)  50,000 Units Oral Q Mon   Continuous Infusions: . sodium chloride 50 mL/hr at 01/12/17 1320  . cefTRIAXone (ROCEPHIN) IVPB 1 gram/50 mL D5W 1 g (01/13/17 1159)  . sodium chloride       LOS: 4 days    Time spent: Total of 25 minutes spent with pt, greater than 50% of which was spent in discussion of  treatment, counseling and coordination of care   EdwinChipper OmanPager: Text Page via [email or web address]   If 7PM-7AM, please contact night-coverage [email or web address] 01/13/2017, 1:15 PM

## 2017-01-14 DIAGNOSIS — J9621 Acute and chronic respiratory failure with hypoxia: Secondary | ICD-10-CM

## 2017-01-14 LAB — RESPIRATORY PANEL BY PCR
ADENOVIRUS-RVPPCR: NOT DETECTED
Bordetella pertussis: NOT DETECTED
CHLAMYDOPHILA PNEUMONIAE-RVPPCR: NOT DETECTED
CORONAVIRUS NL63-RVPPCR: NOT DETECTED
CORONAVIRUS OC43-RVPPCR: NOT DETECTED
Coronavirus 229E: NOT DETECTED
Coronavirus HKU1: NOT DETECTED
INFLUENZA A-RVPPCR: NOT DETECTED
Influenza B: NOT DETECTED
Metapneumovirus: NOT DETECTED
Mycoplasma pneumoniae: NOT DETECTED
PARAINFLUENZA VIRUS 3-RVPPCR: NOT DETECTED
PARAINFLUENZA VIRUS 4-RVPPCR: NOT DETECTED
Parainfluenza Virus 1: NOT DETECTED
Parainfluenza Virus 2: NOT DETECTED
RESPIRATORY SYNCYTIAL VIRUS-RVPPCR: NOT DETECTED
RHINOVIRUS / ENTEROVIRUS - RVPPCR: NOT DETECTED

## 2017-01-14 LAB — FOLATE: Folate: 15.5 ng/mL (ref >5.9)

## 2017-01-14 LAB — BASIC METABOLIC PANEL
ANION GAP: 8 (ref 5–15)
BUN: 31 mg/dL — ABNORMAL HIGH (ref 6–20)
CHLORIDE: 100 mmol/L — AB (ref 101–111)
CO2: 29 mmol/L (ref 22–32)
Calcium: 9 mg/dL (ref 8.9–10.3)
Creatinine, Ser: 1.17 mg/dL (ref 0.61–1.24)
GFR calc Af Amer: >60 mL/min (ref >60)
GFR calc non Af Amer: 53 mL/min — ABNORMAL LOW (ref >60)
GLUCOSE: 170 mg/dL — AB (ref 65–99)
POTASSIUM: 4.8 mmol/L (ref 3.5–5.1)
Sodium: 137 mmol/L (ref 135–145)

## 2017-01-14 LAB — PROCALCITONIN: Procalcitonin: 0.13 ng/mL

## 2017-01-14 LAB — CULTURE, BLOOD (ROUTINE X 2)
Culture: NO GROWTH
Culture: NO GROWTH
SPECIAL REQUESTS: ADEQUATE
Special Requests: ADEQUATE

## 2017-01-14 LAB — IRON AND TIBC
IRON: 18 ug/dL — AB (ref 45–182)
Saturation Ratios: 5 % — ABNORMAL LOW (ref 17.9–39.5)
TIBC: 328 ug/dL (ref 250–450)
UIBC: 310 ug/dL

## 2017-01-14 LAB — VITAMIN B12: Vitamin B-12: 914 pg/mL (ref 180–914)

## 2017-01-14 LAB — CBC
HCT: 30.9 % — ABNORMAL LOW (ref 39.0–52.0)
Hemoglobin: 9 g/dL — ABNORMAL LOW (ref 13.0–17.0)
MCH: 21.3 pg — AB (ref 26.0–34.0)
MCHC: 29.1 g/dL — AB (ref 30.0–36.0)
MCV: 73.2 fL — ABNORMAL LOW (ref 78.0–100.0)
PLATELETS: 391 10*3/uL (ref 150–400)
RBC: 4.22 MIL/uL (ref 4.22–5.81)
RDW: 19.4 % — ABNORMAL HIGH (ref 11.5–15.5)
WBC: 6.4 10*3/uL (ref 4.0–10.5)

## 2017-01-14 LAB — RETICULOCYTES
RBC.: 4.22 MIL/uL (ref 4.22–5.81)
Retic Count, Absolute: 38 10*3/uL (ref 19.0–186.0)
Retic Ct Pct: 0.9 % (ref 0.4–3.1)

## 2017-01-14 LAB — FERRITIN: Ferritin: 29 ng/mL (ref 24–336)

## 2017-01-14 MED ORDER — GABAPENTIN 100 MG PO CAPS
200.0000 mg | ORAL_CAPSULE | Freq: Three times a day (TID) | ORAL | Status: DC
  Administered 2017-01-14 – 2017-01-15 (×2): 200 mg via ORAL
  Filled 2017-01-14 (×2): qty 2

## 2017-01-14 MED ORDER — IPRATROPIUM-ALBUTEROL 0.5-2.5 (3) MG/3ML IN SOLN
3.0000 mL | Freq: Two times a day (BID) | RESPIRATORY_TRACT | Status: DC
  Administered 2017-01-14 – 2017-01-15 (×2): 3 mL via RESPIRATORY_TRACT
  Filled 2017-01-14: qty 3

## 2017-01-14 MED ORDER — FUROSEMIDE 20 MG PO TABS
20.0000 mg | ORAL_TABLET | Freq: Every day | ORAL | Status: DC
  Administered 2017-01-14 – 2017-01-15 (×2): 20 mg via ORAL
  Filled 2017-01-14 (×2): qty 1

## 2017-01-14 NOTE — Progress Notes (Signed)
Patient ambulated on RA from his room to the nurse's station and back to room, O2 sats was 90%. Will keep monitoring.

## 2017-01-14 NOTE — Consult Note (Addendum)
Name: Jose COLTRANE Sr. MRN: [id number] DOB: 1925/09/02    ADMISSION DATE:  01/09/2017 CONSULTATION DATE:  1/3   REFERRING MD :  Elon Jester  CHIEF COMPLAINT:  Hypoxia   BRIEF PATIENT DESCRIPTION:    SIGNIFICANT EVENTS    STUDIES:  CT chest 1/3: no [email or web address] right pleural effusion. Some atx   brief 84 year old male w/ sig h/o HTN, chronic diastolic HF, CKD and COPD (CAT score 9 FEV12 53% predicted) & WHO class II-III pulmonary hypertension.  Most recently in 2018 he has been seeing Dr. Vaughan Browner.  He he initially underwent pulmonary function test that suggested restriction with mixed obstruction and a significantly lower DLCO in the 30s.  He then underwent high-resolution CT scan of the chest that did not show any interstitial lung disease but he did have evidence of pulmonary hypertension and moderate centrilobular emphysema and some basal atelectasis.  He subsequently had a home sleep study that showed significant obstructive sleep apnea with apnea hypopnea index of 27 events per hour.  Echocardiogram showed severe pulmonary hypertension and grade 2 diastolic dysfunction.  He has been started on CPAP therapy.  CT angiogram ruled out pulmonary embolism.  Flu PCR is negative.  He has not had Multiplex respiratory virus panel PCR admitted on 12/30 w/ cc: increased fatigue, intermittent fever and worsening shortness of breath..  He has been hypoxemic requiring 2 L oxygen using a walker on room air with physical therapy since admission he is slowly getting better.  In fact today he walked and walk 1 length of the hallway before he desaturated to 86% on room air.  [In 2014 in the office he did not desaturate with exertion].  Of note he has been reporting worsening dyspnea for the last 2 months and this was prior to initiation of CPAP therapy.  EVENTs 01/09/2017 - admit 01/13/17 - pulm consult   SUBJECTIVE/OVERNIGHT/INTERVAL HX 01/14/17 - RVP negative. Better wants to go home. On 2L [place] at rest but  unclear if he needs it. HAs improved with IV steroid and lasix. Wife with many questions. Wants him to swtich from Dr Vaughan Browner to Dr Chase Caller . Patient son does home and car insurance for Dr Chase Caller. Wife wants to reintiate sleep study ; the cancelled due to admission   VITAL SIGNS: Temp:  [97.9 F (36.6 C)-98.8 F (37.1 C)] 98.8 F (37.1 C) (01/04 1443) Pulse Rate:  [78-87] 79 (01/04 1443) Resp:  [16-20] 18 (01/04 1443) BP: (136-142)/(54-71) 142/62 (01/04 1443) SpO2:  [93 %-99 %] 93 % (01/04 1443)  PHYSICAL EXAMINATION:  General Appearance:    Looks better  Head:    Normocephalic, without obvious abnormality, atraumatic  Eyes:    PERRL - yes, conjunctiva/corneas - clear      Ears:    Normal external ear canals, both ears  Nose:   NG tube - no but has [place]  Throat:  ETT TUBE - no , OG tube - no  Neck:   Supple,  No enlargement/tenderness/nodules     Lungs:     Clear to auscultation bilaterllay except basal crackles but improved  Chest wall:    No deformity  Heart:    S1 and S2 normal, no murmur, CVP - no.  Pressors - no  Abdomen:     Soft, no masses, no organomegaly  Genitalia:    Not done  Rectal:   not done  Extremities:   Extremities- intact     Skin:   Intact in exposed areas .  Sacral area - no decube     Neurologic:   Sedation - none -> RASS - +1 . Moves all 4s - yes. CAM-ICU - neg . Orientation - x3+    PULMONARY No results for input(s): PHART, PCO2ART, PO2ART, HCO3, TCO2, O2SAT in the last 168 hours.  Invalid input(s): PCO2, PO2  CBC Recent Labs  Lab 01/12/17 0414 01/13/17 0403 01/14/17 0340  HGB 8.4* 8.1* 9.0*  HCT 29.4* 28.2* 30.9*  WBC 8.3 9.0 6.4  PLT 304 312 391    COAGULATION Recent Labs  Lab 01/11/17 2154  INR 1.09    CARDIAC  No results for input(s): TROPONINI in the last 168 hours. No results for input(s): PROBNP in the last 168 hours.   CHEMISTRY Recent Labs  Lab 01/09/17 2124 01/10/17 0416 01/11/17 0410 01/12/17 0414  01/13/17 0753 01/14/17 0359  NA  --  134* 134* 137 137 137  K  --  4.2 4.4 4.6 4.7 4.8  CL  --  102 102 102 102 100*  CO2  --  25 26 28 29 29   GLUCOSE  --  101* 106* 96 126* 170*  BUN  --  54* 49* 38* 28* 31*  CREATININE  --  1.78* 1.77* 1.43* 1.25* 1.17  CALCIUM  --  8.2* 8.4* 8.6* 8.9 9.0  MG 1.7  --   --   --   --   --   PHOS 2.9  --   --   --   --   --    Estimated Creatinine Clearance: 52.5 mL/min (by C-G formula based on SCr of 1.17 mg/dL).   LIVER Recent Labs  Lab 01/09/17 1011 01/11/17 2154 01/12/17 0414  AST 21  --  18  ALT 16*  --  24  ALKPHOS 72  --  74  BILITOT 1.3*  --  0.6  PROT 7.0  --  6.1*  ALBUMIN 3.6  --  2.9*  INR  --  1.09  --      INFECTIOUS Recent Labs  Lab 01/09/17 0949 01/09/17 1211 01/13/17 1719 01/14/17 0340  LATICACIDVEN 2.71* 1.40  --   --   PROCALCITON  --   --  0.18 0.13     ENDOCRINE CBG (last 3)  No results for input(s): GLUCAP in the last 72 hours.       IMAGING x48h  - image(s) personally visualized  -   highlighted in bold Ct Angio Chest Pe W Or Wo Contrast  Result Date: 01/13/2017 CLINICAL DATA:  Shortness of breath, elevated D-dimer level. EXAM: CT ANGIOGRAPHY CHEST WITH CONTRAST TECHNIQUE: Multidetector CT imaging of the chest was performed using the standard protocol during bolus administration of intravenous contrast. Multiplanar CT image reconstructions and MIPs were obtained to evaluate the vascular anatomy. CONTRAST:  75mL ISOVUE-370 IOPAMIDOL (ISOVUE-370) INJECTION 76% COMPARISON:  CT scan of January 11, 2017. FINDINGS: Cardiovascular: Satisfactory opacification of the pulmonary arteries to the segmental level. No evidence of pulmonary embolism. Normal heart size. No pericardial effusion. Atherosclerosis of thoracic aorta is noted without aneurysm or dissection. Mediastinum/Nodes: Status post thyroidectomy. The esophagus is unremarkable. No adenopathy is noted. Lungs/Pleura: No pneumothorax is noted. Mild right  pleural effusion is noted with adjacent subsegmental atelectasis. Upper Abdomen: No acute abnormality. Musculoskeletal: No chest wall abnormality. No acute or significant osseous findings. Review of the MIP images confirms the above findings. IMPRESSION: No definite evidence of pulmonary embolus. Mild right pleural effusion with adjacent subsegmental atelectasis. Aortic Atherosclerosis (ICD10-I70.0). Electronically Signed  By: Marijo Conception, M.D.   On: 01/13/2017 12:17      ASSESSMENT / PLAN:  Sounds like he has had a COPD exacerbation.  He has severe pulmonary hypertension on account of his new dx sleep apnea (based pn home study) and emphysema.  I suspect given findings of pleural effusion he has mild decomp cor pulmonale/.. He was to have opd formal sleep study but this got canceled due to admission  On 01/14/2017 - RVP negative but wife describes viral presentation. He is better with lasix and steroids. So, more credence to above hyphteisis      Plan For COPD/Pulm THtn -walk test on RA for o2 need  - at home resume stiolto daily (was not helping but I told him he needs this) - dc on alb mdi prn  - at fu with APP - start inhaled steroid (pulmiccort, asamnex or flovent or arnuity) -  Is on new lasix at hospital - dc on low dose 20mg  per day ; check lytes at followup - See APP in pulm as below -> then pulm followup with [facility] (patient switching)  For new OSA  - APP to reinitiate sleep study appt at time of followup in pulm clinic - and then make referral with [facility] for sleep  D/w Dr Quincy Simmonds   > 50% of this > 40 min visit spent in face to face counseling or/and coordination of care   Future Appointments  Date Time Provider [place]  01/20/2017  3:15 PM Parrett, Fonnie Mu, NP [facility] None  02/14/2017  8:00 PM [facility] ROOM 1 [facility]   pccm wil sign off  Dr. Brand Males, M.D., [facility].C.P Pulmonary and Critical Care Medicine Staff Physician,  [place] Director - Interstitial Lung Disease  Program  Pulmonary [place] at [place], [place], [address]  Pager: [phone], If no answer or between  15:00h - 7:00h: call [phone] Telephone: [phone]

## 2017-01-14 NOTE — Progress Notes (Signed)
SATURATION QUALIFICATIONS: (This note is used to comply with regulatory documentation for home oxygen)  Patient Saturations on Room Air at Rest = 92%  Patient Saturations on Room Air while Ambulating = 86%  Patient Saturations on 2 Liters of oxygen while Ambulating = 90%  Please briefly explain why patient needs home oxygen: patient requires Oxygen to keep oxygen saturation greater than 88%.[name] PT [phone]

## 2017-01-14 NOTE — Progress Notes (Signed)
PROGRESS NOTE [facility]   Jose GAYMON Sr.   PNT:[id number] DOB: 1925/07/20  DOA: 01/09/2017 PCP: [name], MD   Brief Narrative:  Jose Burly Thivierge Sr. is a 84 year old male with hypertension,chronic diastolic CHF, CKD stage 3, bladder cancer who presented to ED withgeneral malaise, increased SOB, intermittent fever/chills and increase fatigue for the last 2 days. CXR without infiltrates, patient with hyponatremia, acute on chronic renal failure, UA suggesting infection, elevated WBC's and lactic acidosis. Cultures taken, antibiotics initiated.  Subjective: Patient seen and examined, significant improvement after steroids and Lasix. Report breathing is better. Denies chest pain or palpitations   Assessment & Plan:   Principal Problem:   SIRS (systemic inflammatory response syndrome) (HCC) Active Problems:   HTN (hypertension)   Hyperlipidemia   COPD GOLD II   GERD (gastroesophageal reflux disease)   Hypothyroidism   Shortness of breath   Acute on chronic renal failure (HCC)   Urinary tract infection with hematuria  Severe sepsis due to UTI, Pneumonia ruled out  - Severe sepsis criteria met on admission withlow gradefever, tachypnea, hypotension, leukocytosisand lactic acidosis in addition to suspected source of infection: UTI - Lactic acid is2.71; repeat level is WNL - UA grossly abnormal, urine culture performed after abx - insignificant growth  - Blood cultures NGT x 3  - Continue Rocephin for now, can d/c azithro   Acute Hypoxic Resp Failure due to COPD exacerbation, Pulm Hypertension, dCHF and cor pulmonale  - PE and PNA ruled out  - O2 requirement decreasing, able to wean off to 1L, will continue as tolerated  - PCCM consult appreciated  - Continue dounebs and albuterol  - Monitor O2 with ambulation in AM  - Continue low dose Lasix, overnight with good diuresis   UTI  Abnormal UA with hematuria - UA showed large leukocytes as well as  RBC's - Urine cultures insignificant growth although collected after abx  - Completed treatment with Rocephin x 5 days   HTN (hypertension), essential - BP stable  - Will resume losartan as renal functions as improved   Acute renal failure superimposed on CKD stage 3 - improved  - Baseline Cr fluctuates, recently 1.2 - Cr 2.09 on this admission likely elevated due to acute infectious process - Cr at baseline  - d/c IVF   Hypothyroidism - Continue Synthroid  Hand pain and numbness  Bilateral numbness and pain, worse in AM, c/w arthritic pain and possible peripheral neuropathy.  Increase gabapentin, may need outpatient EMG for ? Carpal tunnel syndrome   DVT prophylaxis: Heparin ggt  Code Status: Full code  Family Communication: Wife at bedside  Disposition Plan: Home in the next 24-36 hrs   Consultants:   None   Procedures:   V/Q scan   Antimicrobials: Anti-infectives (From admission, onward)   Start     Dose/Rate Route Frequency Ordered Stop   01/11/17 1200  azithromycin (ZITHROMAX) tablet 500 mg  Status:  Discontinued     500 mg Oral Every 24 hours 01/11/17 0726 01/13/17 1320   01/10/17 1200  azithromycin (ZITHROMAX) 500 mg in dextrose 5 % 250 mL IVPB  Status:  Discontinued     500 mg 250 mL/hr over 60 Minutes Intravenous Every 24 hours 01/09/17 1940 01/11/17 0726   01/10/17 1100  cefTRIAXone (ROCEPHIN) 1 g in dextrose 5 % 50 mL IVPB     1 g 100 mL/hr over 30 Minutes Intravenous Every 24 hours 01/09/17 1940     01/09/17 1030  cefTRIAXone (ROCEPHIN) 1 g in dextrose 5 % 50 mL IVPB     1 g 100 mL/hr over 30 Minutes Intravenous  Once 01/09/17 1019 01/09/17 1221   01/09/17 1030  azithromycin (ZITHROMAX) 500 mg in dextrose 5 % 250 mL IVPB     500 mg 250 mL/hr over 60 Minutes Intravenous  Once 01/09/17 1019 01/09/17 1340       Objective: Vitals:   01/13/17 2339 01/14/17 0418 01/14/17 0744 01/14/17 1443  BP: (!) 140/54 136/71  (!) 142/62  Pulse: 78 87  79  Resp:  '16 20  18  '$ Temp: 97.9 F (36.6 C) 98.1 F (36.7 C)  98.8 F (37.1 C)  TempSrc: Oral Oral  Oral  SpO2: 94% 97% 94% 93%  Weight:      Height:        Intake/Output Summary (Last 24 hours) at 01/14/2017 1630 Last data filed at 01/14/2017 1249 Gross per 24 hour  Intake 950 ml  Output 300 ml  Net 650 ml   Filed Weights   01/09/17 0945 01/09/17 1519  Weight: 89.8 kg (198 lb) 92.1 kg (203 lb 0.7 oz)    Examination:  General: NAD  Cardiovascular: RRR, S1/S2 +, no rubs, no gallops Respiratory: Good air entry, no crackles or wheezing  Abdominal: Soft, NT, ND, bowel sounds + Extremities: Trace b/l LE edema    Data Reviewed: I have personally reviewed following labs and imaging studies  CBC: Recent Labs  Lab 01/09/17 1011 01/10/17 0416 01/11/17 0410 01/12/17 0414 01/13/17 0403 01/14/17 0340  WBC 31.2* 19.1* 12.8* 8.3 9.0 6.4  NEUTROABS 28.4*  --   --   --   --   --   HGB 10.2* 8.1* 8.1* 8.4* 8.1* 9.0*  HCT 33.3* 27.5* 27.3* 29.4* 28.2* 30.9*  MCV 71.9* 71.8* 73.0* 73.7* 73.8* 73.2*  PLT 229 191 239 304 312 008   Basic Metabolic Panel: Recent Labs  Lab 01/09/17 2124 01/10/17 0416 01/11/17 0410 01/12/17 0414 01/13/17 0753 01/14/17 0359  NA  --  134* 134* 137 137 137  K  --  4.2 4.4 4.6 4.7 4.8  CL  --  102 102 102 102 100*  CO2  --  '25 26 28 29 29  '$ GLUCOSE  --  101* 106* 96 126* 170*  BUN  --  54* 49* 38* 28* 31*  CREATININE  --  1.78* 1.77* 1.43* 1.25* 1.17  CALCIUM  --  8.2* 8.4* 8.6* 8.9 9.0  MG 1.7  --   --   --   --   --   PHOS 2.9  --   --   --   --   --    GFR: Estimated Creatinine Clearance: 52.5 mL/min (by C-G formula based on SCr of 1.17 mg/dL). Liver Function Tests: Recent Labs  Lab 01/09/17 1011 01/12/17 0414  AST 21 18  ALT 16* 24  ALKPHOS 72 74  BILITOT 1.3* 0.6  PROT 7.0 6.1*  ALBUMIN 3.6 2.9*   No results for input(s): LIPASE, AMYLASE in the last 168 hours. No results for input(s): AMMONIA in the last 168 hours. Coagulation  Profile: Recent Labs  Lab 01/11/17 2154  INR 1.09   Cardiac Enzymes: No results for input(s): CKTOTAL, CKMB, CKMBINDEX, TROPONINI in the last 168 hours. BNP (last 3 results) No results for input(s): PROBNP in the last 8760 hours. HbA1C: No results for input(s): HGBA1C in the last 72 hours. CBG: No results for input(s): GLUCAP in the last 168 hours.  Lipid Profile: No results for input(s): CHOL, HDL, LDLCALC, TRIG, CHOLHDL, LDLDIRECT in the last 72 hours. Thyroid Function Tests: No results for input(s): TSH, T4TOTAL, FREET4, T3FREE, THYROIDAB in the last 72 hours. Anemia Panel: Recent Labs    01/14/17 0340  VITAMINB12 914  FOLATE 15.5  FERRITIN 29  TIBC 328  IRON 18*  RETICCTPCT 0.9   Sepsis Labs: Recent Labs  Lab 01/09/17 0949 01/09/17 1211 01/13/17 1719 01/14/17 0340  PROCALCITON  --   --  0.18 0.13  LATICACIDVEN 2.71* 1.40  --   --     Recent Results (from the past 240 hour(s))  Blood Culture (routine x 2)     Status: None   Collection Time: 01/09/17 10:15 AM  Result Value Ref Range Status   Specimen Description BLOOD RIGHT FOREARM  Final   Special Requests   Final    BOTTLES DRAWN AEROBIC AND ANAEROBIC Blood Culture adequate volume   Culture   Final    NO GROWTH 5 DAYS Performed at [facility], [place] [address], [place], [place] [address]    Report Status 01/14/2017 FINAL  Final  Blood Culture (routine x 2)     Status: None   Collection Time: 01/09/17 10:20 AM  Result Value Ref Range Status   Specimen Description BLOOD LEFT ANTECUBITAL  Final   Special Requests   Final    BOTTLES DRAWN AEROBIC AND ANAEROBIC Blood Culture adequate volume   Culture   Final    NO GROWTH 5 DAYS Performed at [facility], [place] [address] [place], [place] [address]    Report Status 01/14/2017 FINAL  Final  Culture, Urine     Status: Abnormal   Collection Time: 01/10/17  6:39 PM  Result Value Ref Range Status   Specimen Description URINE, RANDOM  Final    Special Requests NONE  Final   Culture (A)  Final    <10,000 COLONIES/mL INSIGNIFICANT GROWTH Performed at [facility], [place] [address] [place], [place] [address]    Report Status 01/12/2017 FINAL  Final  Respiratory Panel by PCR     Status: None   Collection Time: 01/13/17  7:00 PM  Result Value Ref Range Status   Adenovirus NOT DETECTED NOT DETECTED Final   Coronavirus 229E NOT DETECTED NOT DETECTED Final   Coronavirus HKU1 NOT DETECTED NOT DETECTED Final   Coronavirus NL63 NOT DETECTED NOT DETECTED Final   Coronavirus OC43 NOT DETECTED NOT DETECTED Final   Metapneumovirus NOT DETECTED NOT DETECTED Final   Rhinovirus / Enterovirus NOT DETECTED NOT DETECTED Final   Influenza A NOT DETECTED NOT DETECTED Final   Influenza B NOT DETECTED NOT DETECTED Final   Parainfluenza Virus 1 NOT DETECTED NOT DETECTED Final   Parainfluenza Virus 2 NOT DETECTED NOT DETECTED Final   Parainfluenza Virus 3 NOT DETECTED NOT DETECTED Final   Parainfluenza Virus 4 NOT DETECTED NOT DETECTED Final   Respiratory Syncytial Virus NOT DETECTED NOT DETECTED Final   Bordetella pertussis NOT DETECTED NOT DETECTED Final   Chlamydophila pneumoniae NOT DETECTED NOT DETECTED Final   Mycoplasma pneumoniae NOT DETECTED NOT DETECTED Final      Radiology Studies: Ct Angio Chest Pe W Or Wo Contrast  Result Date: 01/13/2017 CLINICAL DATA:  Shortness of breath, elevated D-dimer level. EXAM: CT ANGIOGRAPHY CHEST WITH CONTRAST TECHNIQUE: Multidetector CT imaging of the chest was performed using the standard protocol during bolus administration of intravenous contrast. Multiplanar CT image reconstructions and MIPs were obtained  to evaluate the vascular anatomy. CONTRAST:  26m ISOVUE-370 IOPAMIDOL (ISOVUE-370) INJECTION 76% COMPARISON:  CT scan of January 11, 2017. FINDINGS: Cardiovascular: Satisfactory opacification of the pulmonary arteries to the segmental level. No evidence of pulmonary embolism. Normal heart size. No  pericardial effusion. Atherosclerosis of thoracic aorta is noted without aneurysm or dissection. Mediastinum/Nodes: Status post thyroidectomy. The esophagus is unremarkable. No adenopathy is noted. Lungs/Pleura: No pneumothorax is noted. Mild right pleural effusion is noted with adjacent subsegmental atelectasis. Upper Abdomen: No acute abnormality. Musculoskeletal: No chest wall abnormality. No acute or significant osseous findings. Review of the MIP images confirms the above findings. IMPRESSION: No definite evidence of pulmonary embolus. Mild right pleural effusion with adjacent subsegmental atelectasis. Aortic Atherosclerosis (ICD10-I70.0). Electronically Signed   By: JMarijo Conception M.D.   On: 01/13/2017 12:17    Scheduled Meds: . aspirin EC  81 mg Oral Daily  . atorvastatin  20 mg Oral QPC breakfast  . budesonide (PULMICORT) nebulizer solution  0.5 mg Nebulization BID  . dextromethorphan-guaiFENesin  1 tablet Oral BID  . furosemide  20 mg Oral Daily  . gabapentin  100 mg Oral TID  . ipratropium-albuterol  3 mL Nebulization BID  . iron polysaccharides  150 mg Oral Daily  . levothyroxine  175 mcg Oral QAC breakfast  . methylPREDNISolone (SOLU-MEDROL) injection  40 mg Intravenous Q6H  . pantoprazole  40 mg Oral Daily  . vitamin B-12  1,000 mcg Oral Once per day on Mon Wed Fri  . Vitamin D (Ergocalciferol)  50,000 Units Oral Q Mon   Continuous Infusions: . cefTRIAXone (ROCEPHIN) IVPB 1 gram/50 mL D5W Stopped (01/14/17 1155)     LOS: 5 days    Time spent: Total of 25 minutes spent with pt, greater than 50% of which was spent in discussion of  treatment, counseling and coordination of care  EChipper Oman MD Pager: Text Page via [email or web address]   If 7PM-7AM, please contact night-coverage [email or web address] 01/14/2017, 4:30 PM

## 2017-01-14 NOTE — Progress Notes (Signed)
Pt from home with wife. PT recommendations gone over with pt at bedside and choice offered for home health services. [facility] chosen and [facility] rep alerted of referral. RW requested and order received. [facility] rep alerted of need for RW. Marney Doctor RN,BSN,NCM [phone]

## 2017-01-14 NOTE — Progress Notes (Signed)
Physical Therapy Treatment Patient Details Name: Jose HEGNER Sr. MRN: [id number] DOB: Dec 10, 1925 Today's Date: 01/14/2017    History of Present Illness 84 year old male with hypertension,chronic diastolic CHF, CKD stage 3, bladder cancer who presented to ED with general malaise, increased SOB, intermittent fever/chills and increase fatigue. Dx of sepsis, UTI, acute on chronic renal failure.    PT Comments    Patient ambulated on RA with drop in saturation to 86% which increased with 2 liters [place]. The  Patient  Does have 13 steps to bedroom. Will test on stairs next visit. Continue PT. The patient states that he does not want to go home with oxygen.  Follow Up Recommendations  Home health PT     Equipment Recommendations  Rolling walker with 5" wheels;Other (comment)    Recommendations for Other Services       Precautions / Restrictions Precautions Precautions: Fall Precaution Comments:  monitor O2    Mobility  Bed Mobility               General bed mobility comments: in recliner  Transfers Overall transfer level: Needs assistance Equipment used: Rolling walker (2 wheeled) Transfers: Sit to/from Stand Sit to Stand: Min guard         General transfer comment: VCs hand placement, increased time/effort to stand from recliner using armrests, steady assist  upon standing  Ambulation/Gait Ambulation/Gait assistance: Min guard Ambulation Distance (Feet): 400 Feet Assistive device: Rolling walker (2 wheeled) Gait Pattern/deviations: Step-through pattern     General Gait Details: steady with RW, SaO2 86% on room air after 200', placed on 2 liters with saturation increased to 92% for remainder of the  walk test.  2/4 dyspnea,    Stairs            Wheelchair Mobility    Modified Rankin (Stroke Patients Only)       Balance                                            Cognition Arousal/Alertness: Awake/alert Behavior During Therapy:  WFL for tasks assessed/performed                                          Exercises      General Comments        Pertinent Vitals/Pain Pain Assessment: No/denies pain    Home Living                      Prior Function            PT Goals (current goals can now be found in the care plan section) Progress towards PT goals: Progressing toward goals    Frequency    Min 3X/week      PT Plan Current plan remains appropriate    Co-evaluation              AM-PAC PT "6 Clicks" Daily Activity  Outcome Measure  Difficulty turning over in bed (including adjusting bedclothes, sheets and blankets)?: None Difficulty moving from lying on back to sitting on the side of the bed? : None Difficulty sitting down on and standing up from a chair with arms (e.g., wheelchair, bedside commode, etc,.)?: A Little Help needed moving to and from  a bed to chair (including a wheelchair)?: A Little Help needed walking in hospital room?: A Little Help needed climbing 3-5 steps with a railing? : A Little 6 Click Score: 20    End of Session Equipment Utilized During Treatment: Gait belt Activity Tolerance: Patient tolerated treatment well Patient left: in chair;with call bell/phone within reach;with chair alarm set;with nursing/sitter in room Nurse Communication: Mobility status PT Visit Diagnosis: Difficulty in walking, not elsewhere classified (R26.2);Unsteadiness on feet (R26.81)     Time: 6681-5947 PT Time Calculation (min) (ACUTE ONLY): 20 min  Charges:  $Gait Training: 8-22 mins                    G CodesTresa Endo PT [phone]    [name] 01/14/2017, 11:34 AM

## 2017-01-15 LAB — MAGNESIUM: MAGNESIUM: 1.8 mg/dL (ref 1.7–2.4)

## 2017-01-15 LAB — CBC
HCT: 29.3 % — ABNORMAL LOW (ref 39.0–52.0)
Hemoglobin: 8.5 g/dL — ABNORMAL LOW (ref 13.0–17.0)
MCH: 21 pg — ABNORMAL LOW (ref 26.0–34.0)
MCHC: 29 g/dL — AB (ref 30.0–36.0)
MCV: 72.5 fL — ABNORMAL LOW (ref 78.0–100.0)
PLATELETS: 473 10*3/uL — AB (ref 150–400)
RBC: 4.04 MIL/uL — ABNORMAL LOW (ref 4.22–5.81)
RDW: 19.3 % — AB (ref 11.5–15.5)
WBC: 11.7 10*3/uL — AB (ref 4.0–10.5)

## 2017-01-15 LAB — PHOSPHORUS: Phosphorus: 3.5 mg/dL (ref 2.5–4.6)

## 2017-01-15 LAB — PROCALCITONIN: Procalcitonin: <0.1 ng/mL

## 2017-01-15 LAB — TROPONIN I

## 2017-01-15 MED ORDER — PREDNISONE 10 MG PO TABS: ORAL_TABLET | ORAL | 0 refills | Status: DC

## 2017-01-15 MED ORDER — FUROSEMIDE 20 MG PO TABS: 20.0000 mg | ORAL_TABLET | Freq: Every day | ORAL | 0 refills | Status: DC

## 2017-01-15 MED ORDER — PREDNISONE 20 MG PO TABS
40.0000 mg | ORAL_TABLET | Freq: Every day | ORAL | Status: DC
Start: 2017-01-15 — End: 2017-01-15
  Administered 2017-01-15: 40 mg via ORAL
  Filled 2017-01-15: qty 2

## 2017-01-15 MED ORDER — ALBUTEROL SULFATE (2.5 MG/3ML) 0.083% IN NEBU: 2.5000 mg | INHALATION_SOLUTION | RESPIRATORY_TRACT | 0 refills | Status: DC | PRN

## 2017-01-15 MED ORDER — GABAPENTIN 100 MG PO CAPS: 200.0000 mg | ORAL_CAPSULE | Freq: Three times a day (TID) | ORAL | 0 refills | Status: DC

## 2017-01-15 NOTE — Care Management Note (Addendum)
Case Management Note  Patient Details  Name: Jose DIETRICK Sr. MRN: [id number] Date of Birth: 01/18/1925                    Action/Plan: Discharge Planning: Please see previous NCM notes. Contacted [facility] for Oxygen for home. Contacted [facility] to make aware of dc home today. Has RW in room   PCP -[name] MD  Expected Discharge Date:  01/15/17               Expected Discharge Plan:  [name]  In-House Referral:  NA  Discharge planning Services  CM Consult  Post Acute Care Choice:  Home Health Choice offered to:  Patient  DME Arranged:  Walker rolling DME Agency:  Agency Arranged:  PT, OT [facility] Agency:  [place]  Status of Service:  Completed, signed off  If discussed at [place] of Stay Meetings, dates discussed:    Additional Comments:  Erenest Rasher, RN 01/15/2017, 11:22 AM

## 2017-01-15 NOTE — Progress Notes (Signed)
Patient discharged to home, all discharge medications and instructions reviewed and questions answered.  Patient to be assisted to vehicle by wheelchair once dme home O2 tank is delivered.

## 2017-01-15 NOTE — Progress Notes (Signed)
SATURATION QUALIFICATIONS: (This note is used to comply with regulatory documentation for home oxygen)  Patient Saturations on Room Air at Rest = *93%  Patient Saturations on Room Air while Ambulating = 86%  Patient Saturations on 2 Liters of oxygen while Ambulating = 94%  Please briefly explain why patient needs home oxygen: patient saturatins dropped to 86% on room air while ambulating. Needs oxygen to keep sats above 92%.

## 2017-01-15 NOTE — Discharge Summary (Signed)
Physician Discharge Summary  Jose DERRIG Sr.  UJW:[id number]  DOB: 28-Dec-1925  DOA: 01/09/2017 PCP: [name], MD  Admit date: 01/09/2017 Discharge date: 01/15/2017  Admitted From: Home  Disposition:  Home   Recommendations for Outpatient Follow-up:  1. Follow up with PCP in 1-2 weeks 2. Please obtain BMP/CBC in one week to monitor CBC and Cr  3. Need outpatient sleep studies  4. Follow up with pulmonary   Home Health: PT  Equipment/Devices: Rolling Walker   Discharge Condition: Stable  CODE STATUS: Full Code  Diet recommendation: Heart Healthy   Brief/Interim Summary: For full details see H&P/Progress Notes but in brief, Jose CELESTINE Sr. is a 84 year old male with hypertension,chronic diastolic CHF, CKD stage 3, bladder cancer who presented to ED withgeneral malaise, increased SOB, intermittent fever/chills and increase fatigue for 2 days PTA. CXR without infiltrates, patient with hyponatremia, acute on chronic renal failure, UA suggesting infection, elevated WBC's and lactic acidosis. Cultures taken, antibiotics initiated. Patient was admitted with working diagnosis of sepsis due to UTI. During hospital stay patient was hypoxemic for which he was treated clinically for PNA. PE was ruled out. Subsequently was found pleural effusion and LE edema. Patient was treated with Lasix ECHO showed grade 1 diastolic dfx. Patient was also treated for COPD exacerbation with steroids and nebulizer. Patient was weaned off O2 while at rest but requires some with ambulation and while sleeping. Patient has clinically improved and was discharged home to follow up with pulmonary and PCP.   Subjective: Patient seen and examined, continues to improve, he is now on RA at rest, while sleeping needed O2 (OSA). Denies chest pain, SOB and cough. Afebrile, no acute events overnight.   Discharge Diagnoses/Hospital Course:  Principal Problem:   SIRS (systemic inflammatory response syndrome)  (HCC) Active Problems:   HTN (hypertension)   Hyperlipidemia   COPD GOLD II   GERD (gastroesophageal reflux disease)   Hypothyroidism   Shortness of breath   Acute on chronic renal failure (HCC)   Urinary tract infection with hematuria  Severe sepsis due to UTI, Pneumonia ruled out  - Severe sepsis criteria met on admission withlow gradefever, tachypnea, hypotension, leukocytosisand lactic acidosis in addition to suspected source of infection: UTI - UA grossly abnormal, urine culture performed after abx - insignificant growth  - Blood culturesNGT x 3  - Completed course of Rocephin x 5 days, also was treated with Azithro for 3 days    Acute Hypoxic Resp Failure due to COPD exacerbation, Pulm Hypertension, dCHF and cor pulmonale  - PE and PNA ruled out  - Improved  - Treated with Lasix, Steroids and nebulizer  - Patient advised to continue home Stiolto  - Will prescribe Albuterol MDI  - Prednisone taper for 2 weeks  - Will continue low dose Lasix for now 20 mg daily, to discuss with PCP after checking lytes if need to continue   - Follow up with Pulmonology  - ECHO shows preserve EF with grade 1 diastolic dfx   UTI Abnormal UA with hematuria - Completed treatment  - UA showed large leukocytesas well as RBC's - Urine cultures insignificant growth although collected after abx  - Completed treatment with Rocephin x 5 days   HTN - BP stable  - Continue home medication w/o no changes    Acute renal failure superimposed on CKD stage 3 - improved  - Baseline Crfluctuates, recently 1.2 - Cr2.09on this admission likely elevated due to acute infectious process - Cr  upon discharge 1.17 - Check BMP in 1 week   Hypothyroidism - Continue Synthroid  Hand pain and numbness  Bilateral numbness and pain, worse in AM, c/w arthritic pain and possible peripheral neuropathy.  Continue gabapentin 200 mg TID, may need outpatient EMG    All other chronic medical condition were  stable during the hospitalization.  Patient was seen by physical therapy, recommending HH PT  On the day of the discharge the patient's vitals were stable, and no other acute medical condition were reported by patient. the patient was felt safe to be discharge to home   Discharge Instructions  You were cared for by a hospitalist during your hospital stay. If you have any questions about your discharge medications or the care you received while you were in the hospital after you are discharged, you can call the unit and asked to speak with the hospitalist on call if the hospitalist that took care of you is not available. Once you are discharged, your primary care physician will handle any further medical issues. Please note that NO REFILLS for any discharge medications will be authorized once you are discharged, as it is imperative that you return to your primary care physician (or establish a relationship with a primary care physician if you do not have one) for your aftercare needs so that they can reassess your need for medications and monitor your lab values.  Discharge Instructions    Call MD for:  difficulty breathing, headache or visual disturbances   Complete by:  As directed    Call MD for:  extreme fatigue   Complete by:  As directed    Call MD for:  hives   Complete by:  As directed    Call MD for:  persistant dizziness or light-headedness   Complete by:  As directed    Call MD for:  persistant nausea and vomiting   Complete by:  As directed    Call MD for:  redness, tenderness, or signs of infection (pain, swelling, redness, odor or green/yellow discharge around incision site)   Complete by:  As directed    Call MD for:  severe uncontrolled pain   Complete by:  As directed    Call MD for:  temperature >100.4   Complete by:  As directed    Diet - low sodium heart healthy   Complete by:  As directed    Increase activity slowly   Complete by:  As directed      Allergies as of  01/15/2017   No Known Allergies     Medication List    TAKE these medications   albuterol (2.5 MG/3ML) 0.083% nebulizer solution Commonly known as:  PROVENTIL Take 3 mLs (2.5 mg total) by nebulization every 4 (four) hours as needed for wheezing or shortness of breath.   aspirin EC 81 MG tablet Take 81 mg by mouth daily.   atorvastatin 20 MG tablet Commonly known as:  LIPITOR Take 20 mg by mouth daily after breakfast.   furosemide 20 MG tablet Commonly known as:  LASIX Take 1 tablet (20 mg total) by mouth daily.   gabapentin 100 MG capsule Commonly known as:  NEURONTIN Take 2 capsules (200 mg total) by mouth 3 (three) times daily.   iron polysaccharides 150 MG capsule Commonly known as:  NIFEREX Take 150 mg by mouth daily.   levothyroxine 175 MCG tablet Commonly known as:  SYNTHROID, LEVOTHROID Take 175 mcg by mouth daily after breakfast.   losartan 50  MG tablet Commonly known as:  COZAAR Take 1 tablet (50 mg total) by mouth daily.   nitroGLYCERIN 0.4 MG SL tablet Commonly known as:  NITROSTAT Place 1 tablet (0.4 mg total) under the tongue every 5 (five) minutes as needed for chest pain.   pantoprazole 40 MG tablet Commonly known as:  PROTONIX Take 1 tablet (40 mg total) by mouth daily. What changed:  when to take this   polyethylene glycol powder powder Commonly known as:  GLYCOLAX/MIRALAX Take 17 g by mouth daily as needed (for constipation).   predniSONE 10 MG tablet Commonly known as:  DELTASONE Take 4 tablets for 3 days; Take 3 tablets for 4 days; Take 2 tablets for 3 days; Take 1 tablet for 4 days   testosterone enanthate 200 MG/ML injection Commonly known as:  DELATESTRYL Inject 200 mg into the muscle every 28 (twenty-eight) days. For IM use only   Tiotropium Bromide-Olodaterol 2.5-2.5 MCG/ACT Aers Commonly known as:  STIOLTO RESPIMAT Inhale 2 puffs into the lungs daily.   triamterene-hydrochlorothiazide 37.5-25 MG tablet Commonly known as:   MAXZIDE-25 Take 1 tablet by mouth daily.   vitamin B-12 1000 MCG tablet Commonly known as:  CYANOCOBALAMIN Take 1,000 mcg by mouth 3 (three) times a week.   Vitamin D (Ergocalciferol) 50000 units Caps capsule Commonly known as:  DRISDOL Take 50,000 Units by mouth every Monday.            Durable Medical Equipment  (From admission, onward)        Start     Ordered   01/15/17 1114  DME Oxygen  Once    Question Answer Comment  Mode or (Route) Nasal cannula   Liters per Minute 1   Frequency Continuous (stationary and portable oxygen unit needed)   Oxygen delivery system Gas      01/15/17 1114   01/14/17 1106  For home use only DME Walker rolling  Once    Question:  Patient needs a walker to treat with the following condition  Answer:  Weakness   01/14/17 1105     Follow-up Information    [facility]   Specialty:  Home Health Services Contact information: [place] [address] [phone]        [name], MD. Schedule an appointment as soon as possible for a visit in 1 week(s).   Specialty:  Internal Medicine Why:  Hospital follow up  Contact information: [address] [place] [address] [phone]          No Known Allergies  Consultations:  PCCM    Procedures/Studies: X-ray Chest Pa And Lateral  Result Date: 01/10/2017 CLINICAL DATA:  Shortness of Breath EXAM: CHEST  2 VIEW COMPARISON:  January 09, 2017 chest radiograph and chest CT October 07, 2016 FINDINGS: There is mild bibasilar scarring. There is no appreciable edema or consolidation. Heart size and pulmonary vascularity are normal. No adenopathy. There is aortic atherosclerosis. There is postoperative change at the cervicothoracic junction. IMPRESSION: Mild bibasilar scarring. No edema or consolidation. There is aortic atherosclerosis. Aortic Atherosclerosis (ICD10-I70.0). Electronically Signed   By: Lowella Grip  III M.D.   On: 01/10/2017 09:13   Dg Chest 2 View  Result Date: 01/09/2017 CLINICAL DATA:  Shortness of breath for 6 weeks. EXAM: CHEST  2 VIEW COMPARISON:  10/07/2016 FINDINGS: The heart size and mediastinal contours are within normal limits. Aortic atherosclerosis. Both lungs are clear. The visualized skeletal structures are unremarkable.  IMPRESSION: No active cardiopulmonary disease. Electronically Signed   By: Kerby Moors M.D.   On: 01/09/2017 10:28   Ct Chest Wo Contrast  Result Date: 01/11/2017 CLINICAL DATA:  General malaise and shortness of breath. Admitted for UTI. EXAM: CT CHEST WITHOUT CONTRAST TECHNIQUE: Multidetector CT imaging of the chest was performed following the standard protocol without IV contrast. COMPARISON:  CT chest dated September of 27, 2018. FINDINGS: Cardiovascular: No significant vascular findings. Normal heart size. No pericardial effusion. Normal caliber thoracic aorta. Coronary, aortic arch, and branch vessel atherosclerotic vascular disease. Aberrant origin of the right subclavian artery. The main pulmonary artery remains dilated, measuring up to 3.7 cm. Mediastinum/Nodes: No enlarged mediastinal or axillary lymph nodes. The thyroid gland is surgically absent. Small secretions within the distal trachea. The esophagus demonstrates no significant findings. Lungs/Pleura: Trace right pleural effusion with adjacent mild right basilar atelectasis. Very mild interlobular septal thickening, predominantly in the bilateral upper lobes. Mild centrilobular emphysema. Scattered calcified granulomas again noted. The previously described ground-glass nodule in the right upper lobe and subpleural peripheral nodule in the left lower lobe are no longer identified. Unchanged 3 mm solid nodule in the lingula (series 5, image 106). Stable subpleural 4 mm nodule in the peripheral right upper lobe (series 5, image 36). No pneumothorax or consolidation. Upper Abdomen: No acute abnormality.  Musculoskeletal: Bilateral gynecomastia. No acute or significant osseous findings. IMPRESSION: 1. Trace right pleural effusion with adjacent mild bibasilar atelectasis. No consolidation. 2. Resolution of previously seen right upper lobe ground-glass nodule and subpleural left lower lobe nodule. Stable 4 mm subpleural right upper lobe pulmonary nodule. 3. Stable dilatation of the main pulmonary artery, suggesting pulmonary arterial hypertension. 4.  Emphysema (ICD10-J43.9). 5.  Aortic atherosclerosis (ICD10-I70.0). Electronically Signed   By: Titus Dubin M.D.   On: 01/11/2017 17:54   Ct Angio Chest Pe W Or Wo Contrast  Result Date: 01/13/2017 CLINICAL DATA:  Shortness of breath, elevated D-dimer level. EXAM: CT ANGIOGRAPHY CHEST WITH CONTRAST TECHNIQUE: Multidetector CT imaging of the chest was performed using the standard protocol during bolus administration of intravenous contrast. Multiplanar CT image reconstructions and MIPs were obtained to evaluate the vascular anatomy. CONTRAST:  74m ISOVUE-370 IOPAMIDOL (ISOVUE-370) INJECTION 76% COMPARISON:  CT scan of January 11, 2017. FINDINGS: Cardiovascular: Satisfactory opacification of the pulmonary arteries to the segmental level. No evidence of pulmonary embolism. Normal heart size. No pericardial effusion. Atherosclerosis of thoracic aorta is noted without aneurysm or dissection. Mediastinum/Nodes: Status post thyroidectomy. The esophagus is unremarkable. No adenopathy is noted. Lungs/Pleura: No pneumothorax is noted. Mild right pleural effusion is noted with adjacent subsegmental atelectasis. Upper Abdomen: No acute abnormality. Musculoskeletal: No chest wall abnormality. No acute or significant osseous findings. Review of the MIP images confirms the above findings. IMPRESSION: No definite evidence of pulmonary embolus. Mild right pleural effusion with adjacent subsegmental atelectasis. Aortic Atherosclerosis (ICD10-I70.0). Electronically Signed   By: JMarijo Conception M.D.   On: 01/13/2017 12:17   Nm Pulmonary Perf And Vent  Result Date: 01/12/2017 CLINICAL DATA:  Inpatient. Worsening dyspnea. Positive D-dimer. History of bladder cancer and thyroid cancer. EXAM: NUCLEAR MEDICINE VENTILATION - PERFUSION LUNG SCAN TECHNIQUE: Ventilation images were obtained in multiple projections using inhaled aerosol Tc-921mTPA. Perfusion images were obtained in multiple projections after intravenous injection of Tc-9967mA. RADIOPHARMACEUTICALS:  28.8 mCi Technetium-36m74mA aerosol inhalation and 4.4 mCi Technetium-36m 56mIV COMPARISON:  01/11/2017 chest CT.  01/10/2017 chest radiograph. FINDINGS: Ventilation: Heterogeneous ventilation of both lungs with central  airway radiotracer accumulation. Perfusion: Large matched segmental perfusion defect in the right lower lobe. Subsegmental matched perfusion defects in the right middle lobe and lingula. IMPRESSION: Intermediate probability for pulmonary embolism (20- 79%) by PIOPED II. Electronically Signed   By: Ilona Sorrel M.D.   On: 01/12/2017 12:53    Discharge Exam: Vitals:   01/14/17 2042 01/15/17 0628  BP: 132/60 139/81  Pulse: 87 79  Resp: 20 20  Temp: 98.4 F (36.9 C) 97.9 F (36.6 C)  SpO2: 94% 93%   Vitals:   01/14/17 1443 01/14/17 1803 01/14/17 2042 01/15/17 0628  BP: (!) 142/62  132/60 139/81  Pulse: 79  87 79  Resp: _0 Temp: 98.8 F (37.1 C)  98.4 F (36.9 C) 97.9 F (36.6 C)  TempSrc: Oral  Oral Oral  SpO2: 93% (!) 88% 94% 93%  Weight:      Height:        General: Pt is alert, awake, not in acute distress Cardiovascular: RRR, S1/S2 +, no rubs, no gallops Respiratory: CTA bilaterally, no wheezing, no rhonchi Abdominal: Soft, NT, ND, bowel sounds + Extremities: no edema   The results of significant diagnostics from this hospitalization (including imaging, microbiology, ancillary and laboratory) are listed below for reference.     Microbiology: Recent Results (from the past 240  hour(s))  Blood Culture (routine x 2)     Status: None   Collection Time: 01/09/17 10:15 AM  Result Value Ref Range Status   Specimen Description BLOOD RIGHT FOREARM  Final   Special Requests   Final    BOTTLES DRAWN AEROBIC AND ANAEROBIC Blood Culture adequate volume   Culture   Final    NO GROWTH 5 DAYS Performed at [facility], [address], [place], [place] [address]    Report Status 01/14/2017 FINAL  Final  Blood Culture (routine x 2)     Status: None   Collection Time: 01/09/17 10:20 AM  Result Value Ref Range Status   Specimen Description BLOOD LEFT ANTECUBITAL  Final   Special Requests   Final    BOTTLES DRAWN AEROBIC AND ANAEROBIC Blood Culture adequate volume   Culture   Final    NO GROWTH 5 DAYS Performed at [facility], [place] [address], [place], [place] [address]    Report Status 01/14/2017 FINAL  Final  Culture, Urine     Status: Abnormal   Collection Time: 01/10/17  6:39 PM  Result Value Ref Range Status   Specimen Description URINE, RANDOM  Final   Special Requests NONE  Final   Culture (A)  Final    <10,000 COLONIES/mL INSIGNIFICANT GROWTH Performed at [facility], [place] [address], [place], [place] [address]    Report Status 01/12/2017 FINAL  Final  Respiratory Panel by PCR     Status: None   Collection Time: 01/13/17  7:00 PM  Result Value Ref Range Status   Adenovirus NOT DETECTED NOT DETECTED Final   Coronavirus 229E NOT DETECTED NOT DETECTED Final   Coronavirus HKU1 NOT DETECTED NOT DETECTED Final   Coronavirus NL63 NOT DETECTED NOT DETECTED Final   Coronavirus OC43 NOT DETECTED NOT DETECTED Final   Metapneumovirus NOT DETECTED NOT DETECTED Final   Rhinovirus / Enterovirus NOT DETECTED NOT DETECTED Final   Influenza A NOT DETECTED NOT DETECTED Final   Influenza B NOT DETECTED NOT DETECTED Final   Parainfluenza Virus 1 NOT DETECTED NOT DETECTED Final   Parainfluenza Virus 2  NOT DETECTED NOT DETECTED Final   Parainfluenza Virus 3  NOT DETECTED NOT DETECTED Final   Parainfluenza Virus 4 NOT DETECTED NOT DETECTED Final   Respiratory Syncytial Virus NOT DETECTED NOT DETECTED Final   Bordetella pertussis NOT DETECTED NOT DETECTED Final   Chlamydophila pneumoniae NOT DETECTED NOT DETECTED Final   Mycoplasma pneumoniae NOT DETECTED NOT DETECTED Final     Labs: BNP (last 3 results) No results for input(s): BNP in the last 8760 hours. Basic Metabolic Panel: Recent Labs  Lab 01/09/17 2124 01/10/17 0416 01/11/17 0410 01/12/17 0414 01/13/17 0753 01/14/17 0359 01/15/17 0327  NA  --  134* 134* 137 137 137  --   K  --  4.2 4.4 4.6 4.7 4.8  --   CL  --  102 102 102 102 100*  --   CO2  --  _0 --   GLUCOSE  --  101* 106* 96 126* 170*  --   BUN  --  54* 49* 38* 28* 31*  --   CREATININE  --  1.78* 1.77* 1.43* 1.25* 1.17  --   CALCIUM  --  8.2* 8.4* 8.6* 8.9 9.0  --   MG 1.7  --   --   --   --   --  1.8  PHOS 2.9  --   --   --   --   --  3.5   Liver Function Tests: Recent Labs  Lab 01/09/17 1011 01/12/17 0414  AST 21 18  ALT 16* 24  ALKPHOS 72 74  BILITOT 1.3* 0.6  PROT 7.0 6.1*  ALBUMIN 3.6 2.9*   No results for input(s): LIPASE, AMYLASE in the last 168 hours. No results for input(s): AMMONIA in the last 168 hours. CBC: Recent Labs  Lab 01/09/17 1011  01/11/17 0410 01/12/17 0414 01/13/17 0403 01/14/17 0340 01/15/17 0327  WBC 31.2*   < > 12.8* 8.3 9.0 6.4 11.7*  NEUTROABS 28.4*  --   --   --   --   --   --   HGB 10.2*   < > 8.1* 8.4* 8.1* 9.0* 8.5*  HCT 33.3*   < > 27.3* 29.4* 28.2* 30.9* 29.3*  MCV 71.9*   < > 73.0* 73.7* 73.8* 73.2* 72.5*  PLT 229   < > 239 304 312 391 473*   < > = values in this interval not displayed.   Cardiac Enzymes: Recent Labs  Lab 01/15/17 0327  TROPONINI <0.03   BNP: Invalid input(s): POCBNP CBG: No results for input(s): GLUCAP in the last 168 hours. D-Dimer No results for input(s): DDIMER in the last 72 hours. Hgb A1c No results for input(s):  HGBA1C in the last 72 hours. Lipid Profile No results for input(s): CHOL, HDL, LDLCALC, TRIG, CHOLHDL, LDLDIRECT in the last 72 hours. Thyroid function studies No results for input(s): TSH, T4TOTAL, T3FREE, THYROIDAB in the last 72 hours.  Invalid input(s): FREET3 Anemia work up Recent Labs    01/14/17 0340  VITAMINB12 914  FOLATE 15.5  FERRITIN 29  TIBC 328  IRON 18*  RETICCTPCT 0.9   Urinalysis    Component Value Date/Time   COLORURINE AMBER (A) 01/09/2017 1206   APPEARANCEUR TURBID (A) 01/09/2017 1206   LABSPEC 1.015 01/09/2017 1206   PHURINE 5.0 01/09/2017 1206   GLUCOSEU NEGATIVE 01/09/2017 1206   HGBUR MODERATE (A) 01/09/2017 1206   BILIRUBINUR NEGATIVE 01/09/2017 [place] 01/09/2017 1206   PROTEINUR 100 (A) 01/09/2017  1206   UROBILINOGEN 0.2 05/14/2011 1846   NITRITE NEGATIVE 01/09/2017 1206   LEUKOCYTESUR LARGE (A) 01/09/2017 1206   Sepsis Labs Invalid input(s): PROCALCITONIN,  WBC,  LACTICIDVEN Microbiology Recent Results (from the past 240 hour(s))  Blood Culture (routine x 2)     Status: None   Collection Time: 01/09/17 10:15 AM  Result Value Ref Range Status   Specimen Description BLOOD RIGHT FOREARM  Final   Special Requests   Final    BOTTLES DRAWN AEROBIC AND ANAEROBIC Blood Culture adequate volume   Culture   Final    NO GROWTH 5 DAYS Performed at [facility], [place] [address], [place], [place] [address]    Report Status 01/14/2017 FINAL  Final  Blood Culture (routine x 2)     Status: None   Collection Time: 01/09/17 10:20 AM  Result Value Ref Range Status   Specimen Description BLOOD LEFT ANTECUBITAL  Final   Special Requests   Final    BOTTLES DRAWN AEROBIC AND ANAEROBIC Blood Culture adequate volume   Culture   Final    NO GROWTH 5 DAYS Performed at [facility], [place] [address] [place], [place] [address]    Report Status 01/14/2017 FINAL  Final  Culture, Urine     Status: Abnormal   Collection Time: 01/10/17   6:39 PM  Result Value Ref Range Status   Specimen Description URINE, RANDOM  Final   Special Requests NONE  Final   Culture (A)  Final    <10,000 COLONIES/mL INSIGNIFICANT GROWTH Performed at [facility], [place] [address] [place], [place] [address]    Report Status 01/12/2017 FINAL  Final  Respiratory Panel by PCR     Status: None   Collection Time: 01/13/17  7:00 PM  Result Value Ref Range Status   Adenovirus NOT DETECTED NOT DETECTED Final   Coronavirus 229E NOT DETECTED NOT DETECTED Final   Coronavirus HKU1 NOT DETECTED NOT DETECTED Final   Coronavirus NL63 NOT DETECTED NOT DETECTED Final   Coronavirus OC43 NOT DETECTED NOT DETECTED Final   Metapneumovirus NOT DETECTED NOT DETECTED Final   Rhinovirus / Enterovirus NOT DETECTED NOT DETECTED Final   Influenza A NOT DETECTED NOT DETECTED Final   Influenza B NOT DETECTED NOT DETECTED Final   Parainfluenza Virus 1 NOT DETECTED NOT DETECTED Final   Parainfluenza Virus 2 NOT DETECTED NOT DETECTED Final   Parainfluenza Virus 3 NOT DETECTED NOT DETECTED Final   Parainfluenza Virus 4 NOT DETECTED NOT DETECTED Final   Respiratory Syncytial Virus NOT DETECTED NOT DETECTED Final   Bordetella pertussis NOT DETECTED NOT DETECTED Final   Chlamydophila pneumoniae NOT DETECTED NOT DETECTED Final   Mycoplasma pneumoniae NOT DETECTED NOT DETECTED Final    Time coordinating discharge: 35 minutes  SIGNED:  Chipper Oman, MD  [facility] 01/15/2017, 11:14 AM  Pager please text page via  [email or web address]

## 2017-01-16 ENCOUNTER — Encounter (HOSPITAL_BASED_OUTPATIENT_CLINIC_OR_DEPARTMENT_OTHER): Payer: Medicare Other

## 2017-01-17 DIAGNOSIS — N183 Chronic kidney disease, stage 3 (moderate): Secondary | ICD-10-CM | POA: Diagnosis not present

## 2017-01-17 DIAGNOSIS — I130 Hypertensive heart and chronic kidney disease with heart failure and stage 1 through stage 4 chronic kidney disease, or unspecified chronic kidney disease: Secondary | ICD-10-CM | POA: Diagnosis not present

## 2017-01-17 DIAGNOSIS — N179 Acute kidney failure, unspecified: Secondary | ICD-10-CM | POA: Diagnosis not present

## 2017-01-17 DIAGNOSIS — J441 Chronic obstructive pulmonary disease with (acute) exacerbation: Secondary | ICD-10-CM | POA: Diagnosis not present

## 2017-01-17 DIAGNOSIS — I272 Pulmonary hypertension, unspecified: Secondary | ICD-10-CM | POA: Diagnosis not present

## 2017-01-17 DIAGNOSIS — C679 Malignant neoplasm of bladder, unspecified: Secondary | ICD-10-CM | POA: Diagnosis not present

## 2017-01-17 DIAGNOSIS — E039 Hypothyroidism, unspecified: Secondary | ICD-10-CM | POA: Diagnosis not present

## 2017-01-17 DIAGNOSIS — Z7982 Long term (current) use of aspirin: Secondary | ICD-10-CM | POA: Diagnosis not present

## 2017-01-17 DIAGNOSIS — J9691 Respiratory failure, unspecified with hypoxia: Secondary | ICD-10-CM | POA: Diagnosis not present

## 2017-01-17 DIAGNOSIS — Z87440 Personal history of urinary (tract) infections: Secondary | ICD-10-CM | POA: Diagnosis not present

## 2017-01-20 ENCOUNTER — Other Ambulatory Visit (INDEPENDENT_AMBULATORY_CARE_PROVIDER_SITE_OTHER): Payer: Medicare Other

## 2017-01-20 ENCOUNTER — Ambulatory Visit: Payer: Medicare Other | Admitting: Adult Health

## 2017-01-20 ENCOUNTER — Encounter: Payer: Self-pay | Admitting: Adult Health

## 2017-01-20 VITALS — BP 102/56 | HR 88 | Ht 76.0 in | Wt 194.8 lb

## 2017-01-20 DIAGNOSIS — J9611 Chronic respiratory failure with hypoxia: Secondary | ICD-10-CM | POA: Diagnosis not present

## 2017-01-20 DIAGNOSIS — J449 Chronic obstructive pulmonary disease, unspecified: Secondary | ICD-10-CM | POA: Diagnosis not present

## 2017-01-20 DIAGNOSIS — R0602 Shortness of breath: Secondary | ICD-10-CM | POA: Diagnosis not present

## 2017-01-20 DIAGNOSIS — G4733 Obstructive sleep apnea (adult) (pediatric): Secondary | ICD-10-CM | POA: Diagnosis not present

## 2017-01-20 DIAGNOSIS — N183 Chronic kidney disease, stage 3 (moderate): Secondary | ICD-10-CM | POA: Diagnosis not present

## 2017-01-20 DIAGNOSIS — I5032 Chronic diastolic (congestive) heart failure: Secondary | ICD-10-CM | POA: Diagnosis not present

## 2017-01-20 DIAGNOSIS — N179 Acute kidney failure, unspecified: Secondary | ICD-10-CM

## 2017-01-20 DIAGNOSIS — I5030 Unspecified diastolic (congestive) heart failure: Secondary | ICD-10-CM | POA: Insufficient documentation

## 2017-01-20 LAB — BASIC METABOLIC PANEL
BUN: 46 mg/dL — ABNORMAL HIGH (ref 6–23)
CHLORIDE: 96 meq/L (ref 96–112)
CO2: 33 meq/L — AB (ref 19–32)
Calcium: 9.4 mg/dL (ref 8.4–10.5)
Creatinine, Ser: 1.48 mg/dL (ref 0.40–1.50)
GFR: 47.54 mL/min — AB (ref >60.00)
GLUCOSE: 131 mg/dL — AB (ref 70–99)
POTASSIUM: 5.5 meq/L — AB (ref 3.5–5.1)
SODIUM: 136 meq/L (ref 135–145)

## 2017-01-20 NOTE — Assessment & Plan Note (Signed)
Severe sleep apnea home sleep study.  CPAP titration study is pending.  Continue on oxygen.

## 2017-01-20 NOTE — Assessment & Plan Note (Signed)
Change lasix As needed   Check bmet

## 2017-01-20 NOTE — Assessment & Plan Note (Signed)
Recent decompensated diastolic heart failure improved with diuresis may change Lasix to as needed.  Check be met.

## 2017-01-20 NOTE — Patient Instructions (Addendum)
Change Lasix 20mg  daily As needed  Leg swelling.  Labs today  Continue on Stiolto 2 puffs daily . Rinse after use.  Continue on Oxygen .  Follow up for sleep study as planned As needed   Follow up with Dr. Chase Caller in 6 weeks and As needed   Please contact office for sooner follow up if symptoms do not improve or worsen or seek emergency care

## 2017-01-20 NOTE — Progress Notes (Signed)
_0  ID: Jose Good Sr., male    DOB: 03-13-1925, 84 y.o.   MRN: [id number]  Chief Complaint  Patient presents with  . Follow-up    COPD     Referring provider: [name], MD  HPI: 84 yo former smoker followed for COPD GOLD II and Pulmoinary HTN  Thyroid and Bladder Cancer    TEST   Data Reviewed: PFTs 07/20/12 FVC 2.66 [52%], FEV1 1.85 (53%], F/F 69, TLC 72%, RV/TLC 134%, DLCO 28%. Moderate-severe obstruction, mild restriction with airtrapping, severe diffusion impairment.  PFTs 10/06/16 FVC 2.49 (53%], FEV1 1.91 (57%], F/F 76, TLC 73%, RV/TLC 133%, DLCO 36% Moderate obstructive airway disease, mild restriction with severe diffusion defect  CT abdomen 05/30/13-mild reticular opacities/scarring at the lung bases right greater than left Chest x-ray 05/02/15-hyperinflation, mild lung base scarring Chest x-ray 06/16/14-hyperinflation, no active cardiopulmonary disease. CT scan 10/07/16- no evidence of interstitial lung disease, 4 mm subpleural pulmonary nodule, right upper lobe 1.1 cm groundglass pulmonary nodule. Coronary atherosclerosis, dilated pulmonary artery I reviewed all images personally.  Echocardiogram 10/27/16 LVEF 28-36%, grade 1 diastolic dysfunction severe elevation and PA systolic pressure.  PA peak pressure of 61 RV cavity is mildly dilated, systolic function is normal.   PFT Moderate Obstruction  HST >Severe OSA  CPAP titration study >  01/20/17 Post Hospital follow up : COPD , O2 RF  Pt presents for a post hospital follow up .  Patient was treated for a COPD exacerbation and UTI with urosepsis  last week.  He was treated with antibiotics.  He did have some diastolic dysfunction.  Requiring diuresis.  Patient says since discharge he is feeling better.  Shortness of breath has decreased cough and congestion have also decreased.  Patient had a recent home sleep study that showed severe sleep apnea.  He has been set up for a CPAP titration  study.  That is scheduled for 2 weeks.  Patient is on oxygen at bedtime. He was also discharged on oxygen 2 L with activity.  O2 saturations walking today on room air 88%.  O2 at 1 L O2 saturations 94%.  Patient needs a portable oxygen concentrator as it is too hard for him to carry his tanks.   No Known Allergies  Immunization History  Administered Date(s) Administered  . Influenza Whole 10/12/2011  . Influenza, High Dose Seasonal PF 10/04/2016  . Pneumococcal Polysaccharide-23 01/20/2014    Past Medical History:  Diagnosis Date  . Arthritis    BACK  . Bifascicular block   . Bladder cancer ([address])   . BPH (benign prostatic hyperplasia)   . Chronic constipation   . Chronic restrictive lung disease   . COPD mixed type ([place])   . Coronary artery disease CARDIOLOGIST- DR Thompson Grayer   STRESS TEST 08-17-10 IN [facility] (LOW RISK ,  NO ISCHEMIA)  . GERD (gastroesophageal reflux disease)   . History of basal cell carcinoma excision BILATERAL ARMS  . History of colon polyps   . Hx of thyroid cancer    s/p total thyroidectomy,  . Hyperlipidemia   . Hypertension   . Hypothyroidism   . Hypothyroidism, postsurgical   . Nocturia   . Right bundle branch block   . Sigmoid diverticulosis   . Wears glasses   . Wears glasses     Tobacco History: Social History   Tobacco Use  Smoking Status Former Smoker  . Packs/day: 2.00  . Years: 35.00  . Pack years: 70.00  . Types: Cigarettes  .  Last attempt to quit: 01/11/1982  . Years since quitting: 35.0  Smokeless Tobacco Never Used   Counseling given: Not Answered   Outpatient Encounter Medications as of 01/20/2017  Medication Sig  . albuterol (PROVENTIL) (2.5 MG/3ML) 0.083% nebulizer solution Take 3 mLs (2.5 mg total) by nebulization every 4 (four) hours as needed for wheezing or shortness of breath.  [name] aspirin EC 81 MG tablet Take 81 mg by mouth daily.  [name] atorvastatin (LIPITOR) 20 MG tablet Take 20 mg by mouth daily after breakfast.   .  furosemide (LASIX) 20 MG tablet Take 1 tablet (20 mg total) by mouth daily.  [name] gabapentin (NEURONTIN) 100 MG capsule Take 2 capsules (200 mg total) by mouth 3 (three) times daily.  . iron polysaccharides (NIFEREX) 150 MG capsule Take 150 mg by mouth daily.  [name] levothyroxine (SYNTHROID, LEVOTHROID) 175 MCG tablet Take 175 mcg by mouth daily after breakfast.   . losartan (COZAAR) 50 MG tablet Take 1 tablet (50 mg total) by mouth daily.  . nitroGLYCERIN (NITROSTAT) 0.4 MG SL tablet Place 1 tablet (0.4 mg total) under the tongue every 5 (five) minutes as needed for chest pain.  . pantoprazole (PROTONIX) 40 MG tablet Take 1 tablet (40 mg total) by mouth daily. (Patient taking differently: Take 40 mg by mouth daily after breakfast. )  . polyethylene glycol powder (GLYCOLAX/MIRALAX) powder Take 17 g by mouth daily as needed (for constipation).  . predniSONE (DELTASONE) 10 MG tablet Take 4 tablets for 3 days; Take 3 tablets for 4 days; Take 2 tablets for 3 days; Take 1 tablet for 4 days  . testosterone enanthate (DELATESTRYL) 200 MG/ML injection Inject 200 mg into the muscle every 28 (twenty-eight) days. For IM use only  . Tiotropium Bromide-Olodaterol (STIOLTO RESPIMAT) 2.5-2.5 MCG/ACT AERS Inhale 2 puffs into the lungs daily.  [name] triamterene-hydrochlorothiazide (MAXZIDE-25) 37.5-25 MG tablet Take 1 tablet by mouth daily.  . vitamin B-12 (CYANOCOBALAMIN) 1000 MCG tablet Take 1,000 mcg by mouth 3 (three) times a week.  . Vitamin D, Ergocalciferol, (DRISDOL) 50000 units CAPS capsule Take 50,000 Units by mouth every Monday.    No facility-administered encounter medications on file as of 01/20/2017.      Review of Systems  Constitutional:   No  weight loss, night sweats,  Fevers, chills,  +fatigue, or  lassitude.  HEENT:   No headaches,  Difficulty swallowing,  Tooth/dental problems, or  Sore throat,                No sneezing, itching, ear ache, nasal congestion, post nasal drip,   CV:  No chest pain,   Orthopnea, PND,   anasarca, dizziness, palpitations, syncope.   GI  No heartburn, indigestion, abdominal pain, nausea, vomiting, diarrhea, change in bowel habits, loss of appetite, bloody stools.   Resp:    No chest wall deformity  Skin: no rash or lesions.  GU: no dysuria, change in color of urine, no urgency or frequency.  No flank pain, no hematuria   MS:  No joint pain or swelling.  No decreased range of motion.  No back pain.    Physical Exam  BP (!) 102/56 (BP Location: Left Arm, Cuff Size: Normal)   Pulse 88   Ht '6\' 4"'$  (1.93 m)   Wt 194 lb 12.8 oz (88.4 kg)   SpO2 97%   BMI 23.71 kg/m   GEN: A/Ox3; pleasant , NAD, elderly   HEENT:  [place]/AT,  EACs-clear, TMs-wnl, NOSE-clear, THROAT-clear, no lesions, no postnasal  drip or exudate noted.   NECK:  Supple w/ fair ROM; no JVD; normal carotid impulses w/o bruits; no thyromegaly or nodules palpated; no lymphadenopathy.    RESP  Clear  P & A; w/o, wheezes/ rales/ or rhonchi. no accessory muscle use, no dullness to percussion  CARD:  RRR, no m/r/g, trace peripheral edema, pulses intact, no cyanosis or clubbing.  GI:   Soft & nt; nml bowel sounds; no organomegaly or masses detected.   Musco: Warm bil, no deformities or joint swelling noted.   Neuro: alert, no focal deficits noted.    Skin: Warm, no lesions or rashes     BMET   BNP No results found for: BNP  ProBNP  Imaging: X-ray Chest Pa And Lateral  Result Date: 01/10/2017 CLINICAL DATA:  Shortness of Breath EXAM: CHEST  2 VIEW COMPARISON:  January 09, 2017 chest radiograph and chest CT October 07, 2016 FINDINGS: There is mild bibasilar scarring. There is no appreciable edema or consolidation. Heart size and pulmonary vascularity are normal. No adenopathy. There is aortic atherosclerosis. There is postoperative change at the cervicothoracic junction. IMPRESSION: Mild bibasilar scarring. No edema or consolidation. There is aortic atherosclerosis. Aortic  Atherosclerosis (ICD10-I70.0). Electronically Signed   By: Lowella Grip III M.D.   On: 01/10/2017 09:13   Dg Chest 2 View  Result Date: 01/09/2017 CLINICAL DATA:  Shortness of breath for 6 weeks. EXAM: CHEST  2 VIEW COMPARISON:  10/07/2016 FINDINGS: The heart size and mediastinal contours are within normal limits. Aortic atherosclerosis. Both lungs are clear. The visualized skeletal structures are unremarkable. IMPRESSION: No active cardiopulmonary disease. Electronically Signed   By: Kerby Moors M.D.   On: 01/09/2017 10:28   Ct Chest Wo Contrast  Result Date: 01/11/2017 CLINICAL DATA:  General malaise and shortness of breath. Admitted for UTI. EXAM: CT CHEST WITHOUT CONTRAST TECHNIQUE: Multidetector CT imaging of the chest was performed following the standard protocol without IV contrast. COMPARISON:  CT chest dated September of 27, 2018. FINDINGS: Cardiovascular: No significant vascular findings. Normal heart size. No pericardial effusion. Normal caliber thoracic aorta. Coronary, aortic arch, and branch vessel atherosclerotic vascular disease. Aberrant origin of the right subclavian artery. The main pulmonary artery remains dilated, measuring up to 3.7 cm. Mediastinum/Nodes: No enlarged mediastinal or axillary lymph nodes. The thyroid gland is surgically absent. Small secretions within the distal trachea. The esophagus demonstrates no significant findings. Lungs/Pleura: Trace right pleural effusion with adjacent mild right basilar atelectasis. Very mild interlobular septal thickening, predominantly in the bilateral upper lobes. Mild centrilobular emphysema. Scattered calcified granulomas again noted. The previously described ground-glass nodule in the right upper lobe and subpleural peripheral nodule in the left lower lobe are no longer identified. Unchanged 3 mm solid nodule in the lingula (series 5, image 106). Stable subpleural 4 mm nodule in the peripheral right upper lobe (series 5, image 36).  No pneumothorax or consolidation. Upper Abdomen: No acute abnormality. Musculoskeletal: Bilateral gynecomastia. No acute or significant osseous findings. IMPRESSION: 1. Trace right pleural effusion with adjacent mild bibasilar atelectasis. No consolidation. 2. Resolution of previously seen right upper lobe ground-glass nodule and subpleural left lower lobe nodule. Stable 4 mm subpleural right upper lobe pulmonary nodule. 3. Stable dilatation of the main pulmonary artery, suggesting pulmonary arterial hypertension. 4.  Emphysema (ICD10-J43.9). 5.  Aortic atherosclerosis (ICD10-I70.0). Electronically Signed   By: Titus Dubin M.D.   On: 01/11/2017 17:54   Ct Angio Chest Pe W Or Wo Contrast  Result Date: 01/13/2017 CLINICAL DATA:  Shortness of breath, elevated D-dimer level. EXAM: CT ANGIOGRAPHY CHEST WITH CONTRAST TECHNIQUE: Multidetector CT imaging of the chest was performed using the standard protocol during bolus administration of intravenous contrast. Multiplanar CT image reconstructions and MIPs were obtained to evaluate the vascular anatomy. CONTRAST:  69m ISOVUE-370 IOPAMIDOL (ISOVUE-370) INJECTION 76% COMPARISON:  CT scan of January 11, 2017. FINDINGS: Cardiovascular: Satisfactory opacification of the pulmonary arteries to the segmental level. No evidence of pulmonary embolism. Normal heart size. No pericardial effusion. Atherosclerosis of thoracic aorta is noted without aneurysm or dissection. Mediastinum/Nodes: Status post thyroidectomy. The esophagus is unremarkable. No adenopathy is noted. Lungs/Pleura: No pneumothorax is noted. Mild right pleural effusion is noted with adjacent subsegmental atelectasis. Upper Abdomen: No acute abnormality. Musculoskeletal: No chest wall abnormality. No acute or significant osseous findings. Review of the MIP images confirms the above findings. IMPRESSION: No definite evidence of pulmonary embolus. Mild right pleural effusion with adjacent subsegmental atelectasis.  Aortic Atherosclerosis (ICD10-I70.0). Electronically Signed   By: JMarijo Conception M.D.   On: 01/13/2017 12:17   Nm Pulmonary Perf And Vent  Result Date: 01/12/2017 CLINICAL DATA:  Inpatient. Worsening dyspnea. Positive D-dimer. History of bladder cancer and thyroid cancer. EXAM: NUCLEAR MEDICINE VENTILATION - PERFUSION LUNG SCAN TECHNIQUE: Ventilation images were obtained in multiple projections using inhaled aerosol Tc-978mTPA. Perfusion images were obtained in multiple projections after intravenous injection of Tc-9911mA. RADIOPHARMACEUTICALS:  28.8 mCi Technetium-44m22mA aerosol inhalation and 4.4 mCi Technetium-44m 8mIV COMPARISON:  01/11/2017 chest CT.  01/10/2017 chest radiograph. FINDINGS: Ventilation: Heterogeneous ventilation of both lungs with central airway radiotracer accumulation. Perfusion: Large matched segmental perfusion defect in the right lower lobe. Subsegmental matched perfusion defects in the right middle lobe and lingula. IMPRESSION: Intermediate probability for pulmonary embolism (20- 79%) by PIOPED II. Electronically Signed   By: JasonIlona Sorrel   On: 01/12/2017 12:53     Assessment & Plan:   COPD GOLD II Recent exacerbation now resolving.  Plan Patient Instructions  Change Lasix 20mg 31my As needed  Leg swelling.  Labs today  Continue on Stiolto 2 puffs daily . Rinse after use.  Continue on Oxygen .  Follow up for sleep study as planned As needed   Follow up with Dr. RamaswChase Callerweeks and As needed   Please contact office for sooner follow up if symptoms do not improve or worsen or seek emergency care        Acute on chronic renal failure (HCC) Change lasix As needed   Check bmet    OSA (obstructive sleep apnea) Severe sleep apnea home sleep study.  CPAP titration study is pending.  Continue on oxygen.  Chronic respiratory failure with hypoxia (HCC) Continue on oxygen with activity and at bedtime  Diastolic congestive heart failure  (HCC) Recent decompensated diastolic heart failure improved with diuresis may change Lasix to as needed.  Check be met.     Lasharn Bufkin Rexene Edison/10/2017

## 2017-01-20 NOTE — Assessment & Plan Note (Signed)
Continue on oxygen with activity and at bedtime 

## 2017-01-20 NOTE — Assessment & Plan Note (Signed)
Recent exacerbation now resolving.  Plan Patient Instructions  Change Lasix 20mg  daily As needed  Leg swelling.  Labs today  Continue on Stiolto 2 puffs daily . Rinse after use.  Continue on Oxygen .  Follow up for sleep study as planned As needed   Follow up with Dr. Chase Caller in 6 weeks and As needed   Please contact office for sooner follow up if symptoms do not improve or worsen or seek emergency care

## 2017-01-21 ENCOUNTER — Telehealth: Payer: Self-pay | Admitting: Adult Health

## 2017-01-21 DIAGNOSIS — R5383 Other fatigue: Secondary | ICD-10-CM | POA: Diagnosis not present

## 2017-01-21 DIAGNOSIS — J449 Chronic obstructive pulmonary disease, unspecified: Secondary | ICD-10-CM | POA: Diagnosis not present

## 2017-01-21 DIAGNOSIS — I10 Essential (primary) hypertension: Secondary | ICD-10-CM | POA: Diagnosis not present

## 2017-01-21 DIAGNOSIS — R0602 Shortness of breath: Secondary | ICD-10-CM | POA: Diagnosis not present

## 2017-01-21 NOTE — Progress Notes (Signed)
Called spoke with patient's spouse.  Advised of lab results / recs as stated by TP.  Spouse verbalized understanding and denied any questions.  Pt does not take any K+ supplements.  He does take a Aspirin 81mg  daily.  Pt did see his PCP this morning.  Spouse requesting we call PCP for her.  LM for Dr Shon Baton office - see phone note.

## 2017-01-21 NOTE — Telephone Encounter (Signed)
Per 01/21/17 BMET result note: Notes recorded by Rinaldo Ratel, CMA on 01/21/2017 at 4:49 PM EST Called spoke with patient's spouse.  Advised of lab results / recs as stated by TP.  Spouse verbalized understanding and denied any questions.  Pt does not take any K+ supplements.  He does take a Aspirin 81mg  daily.  Pt did see his PCP this morning.  Spouse requesting we call PCP for her.  LM for Dr Shon Baton office - see phone note. ------  Notes recorded by Melvenia Needles, NP on 01/21/2017 at 4:13 PM EST Labs show kidney fxn is not as good and K + is tr up  Make sure not taking K+ supplement , stop if taking .  If taking NSAID< stop Will need further follow up with PCP to have repeat testing . Please contact office for sooner follow up if symptoms do not improve or worsen or seek emergency care   Left detailed message on DJ's voicemail asking that she call pt/spouse regarding Dr Shon Baton recommendations on BMET results and to please call me so that I know the message was received  Results and ov note faxed to Dr Shon Baton office

## 2017-01-25 DIAGNOSIS — C672 Malignant neoplasm of lateral wall of bladder: Secondary | ICD-10-CM | POA: Diagnosis not present

## 2017-01-25 DIAGNOSIS — N3000 Acute cystitis without hematuria: Secondary | ICD-10-CM | POA: Diagnosis not present

## 2017-01-26 NOTE — Telephone Encounter (Signed)
lmtcb x2 for Dr. Buel Ream office.

## 2017-01-27 DIAGNOSIS — R350 Frequency of micturition: Secondary | ICD-10-CM | POA: Diagnosis not present

## 2017-01-27 DIAGNOSIS — R351 Nocturia: Secondary | ICD-10-CM | POA: Diagnosis not present

## 2017-01-27 DIAGNOSIS — N401 Enlarged prostate with lower urinary tract symptoms: Secondary | ICD-10-CM | POA: Diagnosis not present

## 2017-01-27 NOTE — Telephone Encounter (Signed)
DJ returned call from Dr. Buel Ream office stating that they had received the fax of pt's results that we had sent to their office.   DJ stated to me that after receiving the fax from our office, she had called and spoken with pt's wife regarding the results. DJ also stated they had pt come in the following day to have more labwork done.  Nothing further needed.

## 2017-01-31 ENCOUNTER — Emergency Department (HOSPITAL_COMMUNITY)
Admission: EM | Admit: 2017-01-31 | Discharge: 2017-01-31 | Disposition: A | Discharge status: Home / Self Care | Payer: Medicare Other | Attending: Emergency Medicine | Admitting: Emergency Medicine

## 2017-01-31 ENCOUNTER — Other Ambulatory Visit: Payer: Self-pay

## 2017-01-31 ENCOUNTER — Encounter (HOSPITAL_COMMUNITY): Payer: Self-pay

## 2017-01-31 ENCOUNTER — Emergency Department (HOSPITAL_COMMUNITY): Payer: Medicare Other

## 2017-01-31 DIAGNOSIS — R079 Chest pain, unspecified: Secondary | ICD-10-CM | POA: Diagnosis not present

## 2017-01-31 DIAGNOSIS — Z79899 Other long term (current) drug therapy: Secondary | ICD-10-CM | POA: Diagnosis not present

## 2017-01-31 DIAGNOSIS — I2510 Atherosclerotic heart disease of native coronary artery without angina pectoris: Secondary | ICD-10-CM | POA: Diagnosis not present

## 2017-01-31 DIAGNOSIS — J449 Chronic obstructive pulmonary disease, unspecified: Secondary | ICD-10-CM | POA: Insufficient documentation

## 2017-01-31 DIAGNOSIS — Z87891 Personal history of nicotine dependence: Secondary | ICD-10-CM | POA: Diagnosis not present

## 2017-01-31 DIAGNOSIS — E039 Hypothyroidism, unspecified: Secondary | ICD-10-CM | POA: Diagnosis not present

## 2017-01-31 DIAGNOSIS — I5031 Acute diastolic (congestive) heart failure: Secondary | ICD-10-CM | POA: Diagnosis not present

## 2017-01-31 DIAGNOSIS — R072 Precordial pain: Secondary | ICD-10-CM | POA: Diagnosis not present

## 2017-01-31 DIAGNOSIS — I110 Hypertensive heart disease with heart failure: Secondary | ICD-10-CM | POA: Diagnosis not present

## 2017-01-31 LAB — URINALYSIS, ROUTINE W REFLEX MICROSCOPIC
Bilirubin Urine: NEGATIVE
GLUCOSE, UA: NEGATIVE mg/dL
Hgb urine dipstick: NEGATIVE
Ketones, ur: NEGATIVE mg/dL
Nitrite: NEGATIVE
Protein, ur: NEGATIVE mg/dL
SPECIFIC GRAVITY, URINE: 1.012 (ref 1.005–1.030)
SQUAMOUS EPITHELIAL / LPF: NONE SEEN
pH: 6 (ref 5.0–8.0)

## 2017-01-31 LAB — BASIC METABOLIC PANEL
ANION GAP: 11 (ref 5–15)
BUN: 27 mg/dL — ABNORMAL HIGH (ref 6–20)
CHLORIDE: 97 mmol/L — AB (ref 101–111)
CO2: 29 mmol/L (ref 22–32)
Calcium: 9 mg/dL (ref 8.9–10.3)
Creatinine, Ser: 1.55 mg/dL — ABNORMAL HIGH (ref 0.61–1.24)
GFR calc Af Amer: 44 mL/min — ABNORMAL LOW (ref >60)
GFR calc non Af Amer: 38 mL/min — ABNORMAL LOW (ref >60)
GLUCOSE: 104 mg/dL — AB (ref 65–99)
POTASSIUM: 4.8 mmol/L (ref 3.5–5.1)
Sodium: 137 mmol/L (ref 135–145)

## 2017-01-31 LAB — I-STAT TROPONIN, ED
Troponin i, poc: 0 ng/mL (ref 0.00–0.08)
Troponin i, poc: 0 ng/mL (ref 0.00–0.08)

## 2017-01-31 LAB — CBC
HEMATOCRIT: 33 % — AB (ref 39.0–52.0)
HEMOGLOBIN: 9.6 g/dL — AB (ref 13.0–17.0)
MCH: 22 pg — ABNORMAL LOW (ref 26.0–34.0)
MCHC: 29.1 g/dL — AB (ref 30.0–36.0)
MCV: 75.5 fL — AB (ref 78.0–100.0)
Platelets: 625 10*3/uL — ABNORMAL HIGH (ref 150–400)
RBC: 4.37 MIL/uL (ref 4.22–5.81)
RDW: 22.4 % — ABNORMAL HIGH (ref 11.5–15.5)
WBC: 9.6 10*3/uL (ref 4.0–10.5)

## 2017-01-31 MED ORDER — ACETAMINOPHEN 325 MG PO TABS
650.0000 mg | ORAL_TABLET | Freq: Once | ORAL | Status: AC
  Administered 2017-01-31: 650 mg via ORAL
  Filled 2017-01-31: qty 2

## 2017-01-31 MED ORDER — SODIUM CHLORIDE 0.9 % IV BOLUS (SEPSIS)
1000.0000 mL | Freq: Once | INTRAVENOUS | Status: AC
Start: 2017-01-31 — End: 2017-01-31
  Administered 2017-01-31: 1000 mL via INTRAVENOUS

## 2017-01-31 NOTE — ED Notes (Signed)
Pt given Urinal for UA sample

## 2017-01-31 NOTE — ED Notes (Signed)
ED Provider at bedside. 

## 2017-01-31 NOTE — ED Provider Notes (Signed)
[facility] EMERGENCY DEPARTMENT Provider Note   CSN: [id number] Arrival date & time: 01/31/17  0175     History   Chief Complaint Chief Complaint  Patient presents with  . Chest Pain    HPI Jose KAUTZMAN Sr. is a 84 y.o. male.   Chest Pain   This is a new problem. The current episode started less than 1 hour ago. The problem occurs constantly. The problem has been resolved. The pain is present in the substernal region. The pain is mild. The quality of the pain is described as brief and sharp.    Past Medical History:  Diagnosis Date  . Arthritis    BACK  . Bifascicular block   . Bladder cancer ([place])   . BPH (benign prostatic hyperplasia)   . Chronic constipation   . Chronic restrictive lung disease   . COPD mixed type ([place])   . Coronary artery disease CARDIOLOGIST- DR Thompson Grayer   STRESS TEST 08-17-10 IN EPIC (LOW RISK ,  NO ISCHEMIA)  . GERD (gastroesophageal reflux disease)   . History of basal cell carcinoma excision BILATERAL ARMS  . History of colon polyps   . Hx of thyroid cancer    s/p total thyroidectomy,  . Hyperlipidemia   . Hypertension   . Hypothyroidism   . Hypothyroidism, postsurgical   . Nocturia   . Right bundle branch block   . Sigmoid diverticulosis   . Wears glasses   . Wears glasses     Patient Active Problem List   Diagnosis Date Noted  . OSA (obstructive sleep apnea) 01/20/2017  . Chronic respiratory failure with hypoxia ([place]) 01/20/2017  . Diastolic congestive heart failure ([place]) 01/20/2017  . SIRS (systemic inflammatory response syndrome) ([place]) 01/09/2017  . Acute on chronic renal failure ([place]) 01/09/2017  . Urinary tract infection with hematuria   . Chest pain 06/16/2014  . GERD (gastroesophageal reflux disease) 06/16/2014  . Hypothyroidism 06/16/2014  . Pain in the chest   . Shortness of breath   . COPD GOLD II 08/08/2012  . Chronic restrictive lung disease 08/08/2012  . Fever 02/17/2012  . Chest  pain 08/13/2010  . HTN (hypertension) 08/13/2010  . Hyperlipidemia 08/13/2010    Past Surgical History:  Procedure Laterality Date  . CARDIOVASCULAR STRESS TEST  08-17-2010   dr allred   Low risk nuclear study with prominent inferobasal thinning vs small prior infarct, no ischemia/  normal LV function and wall motion , ef 63%  . CATARACT EXTRACTION W/ INTRAOCULAR LENS  IMPLANT, BILATERAL    . CYSTOSCOPY W/ RETROGRADES Bilateral 10/08/2014   Procedure: CYSTOSCOPY WITH BILATERAL RETROGRADE PYELOGRAM, BIOPSY, FULGURATION;  Surgeon: Festus Aloe, MD;  Location: [facility];  Service: Urology;  Laterality: Bilateral;  . CYSTOSCOPY W/ RETROGRADES Bilateral 06/18/2016   Procedure: CYSTOSCOPY WITH RETROGRADE PYELOGRAM;  Surgeon: Festus Aloe, MD;  Location: [facility];  Service: Urology;  Laterality: Bilateral;  . CYSTOSCOPY WITH BIOPSY  12/16/2010   Procedure: CYSTOSCOPY WITH BIOPSY;  Surgeon: Molli Hazard, MD;  Location: [facility];  Service: Urology;  Laterality: N/A;  . CYSTOSCOPY WITH BIOPSY N/A 05/20/2015   Procedure: CYSTOSCOPY WITH BLADDER BIOPSY AND FULGERATION, CYSTOGRAM, BILATERAL RETROGRADES, BILATERAL RENAL WASHINGS;  Surgeon: Festus Aloe, MD;  Location: [facility];  Service: Urology;  Laterality: N/A;  . CYSTOSCOPY WITH BIOPSY N/A 06/18/2016   Procedure: CYSTOSCOPY WITH BIOPSY FULGURATION;  Surgeon: Festus Aloe, MD;  Location: [facility];  Service: Urology;  Laterality:  N/A;  . CYSTOSCOPY/RETROGRADE/URETEROSCOPY  12/16/2010   Procedure: CYSTOSCOPY/RETROGRADE/URETEROSCOPY;  Surgeon: Molli Hazard, MD;  Location: [facility];  Service: Urology;  Laterality: Right;  bilateral retrograde, right ureteroscopy  . EYE SURGERY     attached lens to iris  . GREEN LIGHT LASER TURP (TRANSURETHRAL RESECTION OF PROSTATE  11-02-2011  . KNEE ARTHROSCOPY Left 11/  2016  . TOTAL THYROIDECTOMY  1991  . TRANSURETHRAL  RESECTION OF BLADDER TUMOR  12/16/2010   Procedure: TRANSURETHRAL RESECTION OF BLADDER TUMOR (TURBT);  Surgeon: Molli Hazard, MD;  Location: [facility];  Service: Urology;  Laterality: N/A;       Home Medications    Prior to Admission medications   Medication Sig Start Date End Date Taking? Authorizing Provider  albuterol (PROVENTIL) (2.5 MG/3ML) 0.083% nebulizer solution Take 3 mLs (2.5 mg total) by nebulization every 4 (four) hours as needed for wheezing or shortness of breath. 01/15/17  Yes Patrecia Pour, Christean Grief, MD  atorvastatin (LIPITOR) 20 MG tablet Take 20 mg by mouth daily after breakfast.    Yes [provider]  cephALEXin (KEFLEX) 500 MG capsule Take 500 mg by mouth 2 (two) times daily.   Yes [provider]  gabapentin (NEURONTIN) 100 MG capsule Take 2 capsules (200 mg total) by mouth 3 (three) times daily. 01/15/17  Yes Patrecia Pour, Christean Grief, MD  iron polysaccharides (NIFEREX) 150 MG capsule Take 150 mg by mouth daily.   Yes [provider]  levothyroxine (SYNTHROID, LEVOTHROID) 175 MCG tablet Take 175 mcg by mouth daily after breakfast.    Yes [provider]  losartan (COZAAR) 50 MG tablet Take 1 tablet (50 mg total) by mouth daily. 07/03/14  Yes Allred, Jeneen Rinks, MD  nitroGLYCERIN (NITRODUR - DOSED IN MG/24 HR) 0.1 mg/hr patch Place 0.1 mg onto the skin daily.   Yes [provider]  nitroGLYCERIN (NITROSTAT) 0.4 MG SL tablet Place 1 tablet (0.4 mg total) under the tongue every 5 (five) minutes as needed for chest pain. 07/03/14  Yes Allred, Jeneen Rinks, MD  pantoprazole (PROTONIX) 40 MG tablet Take 1 tablet (40 mg total) by mouth daily. Patient taking differently: Take 40 mg by mouth daily after breakfast.  07/03/14  Yes Allred, Jeneen Rinks, MD  polyethylene glycol powder (GLYCOLAX/MIRALAX) powder Take 17 g by mouth daily as needed (for constipation).   Yes [provider]  testosterone enanthate (DELATESTRYL) 200 MG/ML  injection Inject 200 mg into the muscle every 28 (twenty-eight) days. For IM use only   Yes [provider]  Tiotropium Bromide-Olodaterol (STIOLTO RESPIMAT) 2.5-2.5 MCG/ACT AERS Inhale 2 puffs into the lungs daily. 09/30/16  Yes Mannam, Praveen, MD  triamterene-hydrochlorothiazide (MAXZIDE-25) 37.5-25 MG tablet Take 1 tablet by mouth daily. 03/10/16  Yes [provider]  vitamin B-12 (CYANOCOBALAMIN) 1000 MCG tablet Take 1,000 mcg by mouth 3 (three) times a week.   Yes [provider]  Vitamin D, Ergocalciferol, (DRISDOL) 50000 units CAPS capsule Take 50,000 Units by mouth every Monday.    Yes [provider]  ciprofloxacin (CIPRO) 250 MG tablet Take 250 mg by mouth 2 (two) times daily. 01/28/17   [provider]  predniSONE (DELTASONE) 10 MG tablet Take 4 tablets for 3 days; Take 3 tablets for 4 days; Take 2 tablets for 3 days; Take 1 tablet for 4 days 01/15/17   Doreatha Lew, MD    Family History Family History  Problem Relation Age of Onset  . Lung cancer Brother   .  Thyroid cancer Unknown   . Diabetes Father     Social History Social History   Tobacco Use  . Smoking status: Former Smoker    Packs/day: 2.00    Years: 35.00    Pack years: 70.00    Types: Cigarettes    Last attempt to quit: 01/11/1982    Years since quitting: 35.0  . Smokeless tobacco: Never Used  Substance Use Topics  . Alcohol use: Yes    Alcohol/week: 0.0 oz    Comment: 6-oz vodka per day   . Drug use: No     Allergies   Patient has no known allergies.   Review of Systems Review of Systems  HENT: Positive for ear pain (right).   Cardiovascular: Positive for chest pain.  All other systems reviewed and are negative.    Physical Exam Updated Vital Signs BP (!) 156/67   Pulse 84   Temp 97.7 F (36.5 C)   Resp 19   Ht 6\' 5"  (1.956 m)   Wt 88.5 kg (195 lb)   SpO2 100%   BMI 23.12 kg/m   Physical Exam  Constitutional: He is oriented to person,  place, and time. He appears well-developed and well-nourished.  HENT:  Head: Normocephalic and atraumatic.  Eyes: Conjunctivae and EOM are normal.  Neck: Normal range of motion.  Cardiovascular: Normal rate.  Pulmonary/Chest: Effort normal. No respiratory distress.  Abdominal: He exhibits no distension.  Musculoskeletal: Normal range of motion.  Neurological: He is alert and oriented to person, place, and time. No cranial nerve deficit. Coordination normal.  Nursing note and vitals reviewed.    ED Treatments / Results  Labs (all labs ordered are listed, but only abnormal results are displayed) Labs Reviewed  BASIC METABOLIC PANEL - Abnormal; Notable for the following components:      Result Value   Chloride 97 (*)    Glucose, Bld 104 (*)    BUN 27 (*)    Creatinine, Ser 1.55 (*)    GFR calc non Af Amer 38 (*)    GFR calc Af Amer 44 (*)    All other components within normal limits  CBC - Abnormal; Notable for the following components:   Hemoglobin 9.6 (*)    HCT 33.0 (*)    MCV 75.5 (*)    MCH 22.0 (*)    MCHC 29.1 (*)    RDW 22.4 (*)    Platelets 625 (*)    All other components within normal limits  URINALYSIS, ROUTINE W REFLEX MICROSCOPIC - Abnormal; Notable for the following components:   APPearance HAZY (*)    Leukocytes, UA LARGE (*)    Bacteria, UA RARE (*)    All other components within normal limits  URINE CULTURE  I-STAT TROPONIN, ED  I-STAT TROPONIN, ED    EKG  EKG Interpretation  Date/Time:  Monday January 31 2017 09:29:26 EST Ventricular Rate:  78 PR Interval:    QRS Duration: 158 QT Interval:  408 QTC Calculation: 465 R Axis:   96 Text Interpretation:  Sinus rhythm RBBB and LPFB No significant change since last tracing Confirmed by Merrily Pew [phone]) on 01/31/2017 10:09:31 AM       Radiology Dg Chest 2 View  Result Date: 01/31/2017 CLINICAL DATA:  Central chest pain. EXAM: CHEST  2 VIEW COMPARISON:  January 10, 2017 FINDINGS: The heart  size and mediastinal contours are stable. Both lungs are clear. The lungs are hyperinflated. The visualized skeletal structures are stable. IMPRESSION: No active  cardiopulmonary disease.  Hyperinflated lungs. Electronically Signed   By: Abelardo Diesel M.D.   On: 01/31/2017 10:17    Procedures Procedures (including critical care time)  Medications Ordered in ED Medications  sodium chloride 0.9 % bolus 1,000 mL (0 mLs Intravenous Stopped 01/31/17 1559)  acetaminophen (TYLENOL) tablet 650 mg (650 mg Oral Given 01/31/17 1608)    Initial Impression / Assessment and Plan / ED Course  I have reviewed the triage vital signs and the nursing notes.  Pertinent labs & imaging results that were available during my care of the patient were reviewed by me and considered in my medical decision making (see chart for details).  Suspect CP is non cardiac in nature at this time with negative troponin at 6 hours, unchanged ECG, atypical story and only a few risk factors.  Also with ongoing right ear pain. Unsure of etiology. Ok on exam. Will follow up with ENT as he has already seen his PCP.  Also with ongoing antibiotic use for dysuria and UTI. Urine looks improved. No changes.  Also with dizziness, no orthostatic changes. Low suspicion for intracranial causes.   Final Clinical Impressions(s) / ED Diagnoses   Final diagnoses:  Nonspecific chest pain     Aisha Greenberger, [name], MD 01/31/17 1753

## 2017-01-31 NOTE — ED Notes (Signed)
PT states understanding of care given, follow up care. PT ambulated from ED to car with a steady gait.  

## 2017-01-31 NOTE — ED Triage Notes (Signed)
Pt presents to the ed with complaints of centralized chest pain that was dull in nature.  PT received 324 of aspirin and 1 SL nitro that subsided his pain. Pt denies any associated symptoms. Pain free at this time.

## 2017-02-01 DIAGNOSIS — M170 Bilateral primary osteoarthritis of knee: Secondary | ICD-10-CM | POA: Diagnosis not present

## 2017-02-01 DIAGNOSIS — M542 Cervicalgia: Secondary | ICD-10-CM | POA: Diagnosis not present

## 2017-02-01 DIAGNOSIS — M4682 Other specified inflammatory spondylopathies, cervical region: Secondary | ICD-10-CM | POA: Diagnosis not present

## 2017-02-01 DIAGNOSIS — M5412 Radiculopathy, cervical region: Secondary | ICD-10-CM | POA: Diagnosis not present

## 2017-02-02 LAB — URINE CULTURE: Culture: 50000 — AB

## 2017-02-03 ENCOUNTER — Telehealth: Payer: Self-pay | Admitting: Emergency Medicine

## 2017-02-03 DIAGNOSIS — J449 Chronic obstructive pulmonary disease, unspecified: Secondary | ICD-10-CM | POA: Diagnosis not present

## 2017-02-03 DIAGNOSIS — R0789 Other chest pain: Secondary | ICD-10-CM | POA: Diagnosis not present

## 2017-02-03 DIAGNOSIS — R0602 Shortness of breath: Secondary | ICD-10-CM | POA: Diagnosis not present

## 2017-02-03 DIAGNOSIS — E298 Other testicular dysfunction: Secondary | ICD-10-CM | POA: Diagnosis not present

## 2017-02-03 NOTE — Telephone Encounter (Signed)
Post ED Visit - Positive Culture Follow-up  Culture report reviewed by antimicrobial stewardship pharmacist:  [x]  Elenor Quinones, Pharm.D. []  Heide Guile, Pharm.D., BCPS AQ-ID []  Parks Neptune, Pharm.D., BCPS []  Alycia Rossetti, Pharm.D., BCPS []  [place], Pharm.D., BCPS, AAHIVP []  Legrand Como, Pharm.D., BCPS, AAHIVP []  Salome Arnt, PharmD, BCPS []  Jalene Mullet, PharmD []  Vincenza Hews, PharmD, BCPS  Positive urine culture Treated with ciprofloxacin and cephalexin, organism sensitive to the same and no further patient follow-up is required at this time.  Hazle Nordmann 02/03/2017, 11:55 AM

## 2017-02-04 ENCOUNTER — Encounter: Payer: Self-pay | Admitting: Gastroenterology

## 2017-02-09 ENCOUNTER — Telehealth: Payer: Self-pay | Admitting: Internal Medicine

## 2017-02-09 DIAGNOSIS — M542 Cervicalgia: Secondary | ICD-10-CM | POA: Diagnosis not present

## 2017-02-09 NOTE — Telephone Encounter (Signed)
Called [facility] to verify this because I had already checked and it does not need an [place] for [employer] for pt Jose Lawson

## 2017-02-09 NOTE — Telephone Encounter (Signed)
Spoke to pt's wife they are aware of this no precert required for this sleep study Joellen [place]

## 2017-02-14 ENCOUNTER — Ambulatory Visit (HOSPITAL_BASED_OUTPATIENT_CLINIC_OR_DEPARTMENT_OTHER): Payer: Medicare Other | Attending: Pulmonary Disease | Admitting: Pulmonary Disease

## 2017-02-14 DIAGNOSIS — G4733 Obstructive sleep apnea (adult) (pediatric): Secondary | ICD-10-CM | POA: Diagnosis not present

## 2017-02-14 DIAGNOSIS — N179 Acute kidney failure, unspecified: Secondary | ICD-10-CM | POA: Diagnosis not present

## 2017-02-15 DIAGNOSIS — G5603 Carpal tunnel syndrome, bilateral upper limbs: Secondary | ICD-10-CM | POA: Diagnosis not present

## 2017-02-15 DIAGNOSIS — M47812 Spondylosis without myelopathy or radiculopathy, cervical region: Secondary | ICD-10-CM | POA: Diagnosis not present

## 2017-02-15 DIAGNOSIS — G4733 Obstructive sleep apnea (adult) (pediatric): Secondary | ICD-10-CM | POA: Diagnosis not present

## 2017-02-15 NOTE — Procedures (Signed)
Patient Name: Jose Lawson, Jose Lawson Date: 02/14/2017 Gender: Male D.O.B: 10-Feb-1925 Age (years): 10 Referring Provider: Marshell Garfinkel Height (inches): 76 Interpreting Physician: Kara Mead MD, ABSM Weight (lbs): 194 RPSGT: Zadie Rhine BMI: 24 MRN: [id number] Neck Size: 17.00 <br> <br> CLINICAL INFORMATION The patient is referred for a CPAP titration to treat sleep apnea.  Date of  HST: 11/2016 AHI 27/h  SLEEP STUDY TECHNIQUE As per the AASM Manual for the Scoring of Sleep and Associated Events v2.3 (April 2016) with a hypopnea requiring 4% desaturations.  The channels recorded and monitored were frontal, central and occipital EEG, electrooculogram (EOG), submentalis EMG (chin), nasal and oral airflow, thoracic and abdominal wall motion, anterior tibialis EMG, snore microphone, electrocardiogram, and pulse oximetry. Continuous positive airway pressure (CPAP) was initiated at the beginning of the study and titrated to treat sleep-disordered breathing.  MEDICATIONS Medications self-administered by patient taken the night of the study : N/A  TECHNICIAN COMMENTS Comments added by technician: PT HAD TWO RESTROOM VISTED. O2 initiated due to low sats. Comments added by scorer: N/A   RESPIRATORY PARAMETERS Optimal PAP Pressure (cm): 9 AHI at Optimal Pressure (/hr): 1.9 Overall Minimal O2 (%): 77.00 Supine % at Optimal Pressure (%): 50 Minimal O2 at Optimal Pressure (%): 85.0   SLEEP ARCHITECTURE The study was initiated at 10:08:25 PM and ended at 4:30:26 AM.  Sleep onset time was 9.4 minutes and the sleep efficiency was 75.3%. The total sleep time was 287.6 minutes.  The patient spent 9.56% of the night in stage N1 sleep, 68.71% in stage N2 sleep, 2.78% in stage N3 and 18.95% in REM.Stage REM latency was 39.0 minutes  Wake after sleep onset was 85.0. Alpha intrusion was absent. Supine sleep was 23.99%.  CARDIAC DATA The 2 lead EKG demonstrated sinus rhythm. The mean heart rate  was 69.05 beats per minute. Other EKG findings include: PVCs.   LEG MOVEMENT DATA The total Periodic Limb Movements of Sleep (PLMS) were 322. The PLMS index was 67.17. A PLMS index of <15 is considered normal in adults.  IMPRESSIONS - The optimal PAP pressure was 9 cm of water. - Central sleep apnea was not noted during this titration (CAI = 0.0/h). - Severe oxygen desaturations were observed during this titration (min O2 = 77.00%). 2L O2 was blended in due to desaturation during REM sleep on final CPAP level - No snoring was audible during this study. - 2-lead EKG demonstrated: PVCs - Severe periodic limb movements were observed during this study. Arousals associated with PLMs were significant.   DIAGNOSIS - Obstructive Sleep Apnea (327.23 [G47.33 ICD-10])   RECOMMENDATIONS - Trial of CPAP therapy on 9 cm H2O with a Medium size Resmed Full Face Mask AirFit F20 mask and heated humidification. - 2L O2 blended into CPAP - Avoid alcohol, sedatives and other CNS depressants that may worsen sleep apnea and disrupt normal sleep architecture. - Sleep hygiene should be reviewed to assess factors that may improve sleep quality. - Weight management and regular exercise should be initiated or continued. - Return to Sleep Center for re-evaluation after 4 weeks of therapy   Kara Mead MD Board Certified in [place]

## 2017-02-17 NOTE — Progress Notes (Signed)
LMTCB

## 2017-02-22 ENCOUNTER — Other Ambulatory Visit: Payer: Self-pay | Admitting: Pulmonary Disease

## 2017-02-22 DIAGNOSIS — G4733 Obstructive sleep apnea (adult) (pediatric): Secondary | ICD-10-CM

## 2017-02-22 NOTE — Progress Notes (Signed)
Talked with Patient and wife regarding starting a CPAP. Order placed. Patient is not established with sleep provider will discuss with MR this Friday 02/25/17. Patient uses [facility] for O2.

## 2017-02-25 ENCOUNTER — Encounter: Payer: Self-pay | Admitting: Internal Medicine

## 2017-02-25 ENCOUNTER — Ambulatory Visit: Payer: Medicare Other | Admitting: Internal Medicine

## 2017-02-25 VITALS — BP 152/70 | HR 89 | Ht 76.0 in | Wt 193.4 lb

## 2017-02-25 DIAGNOSIS — J449 Chronic obstructive pulmonary disease, unspecified: Secondary | ICD-10-CM

## 2017-02-25 DIAGNOSIS — G4733 Obstructive sleep apnea (adult) (pediatric): Secondary | ICD-10-CM

## 2017-02-25 DIAGNOSIS — N3020 Other chronic cystitis without hematuria: Secondary | ICD-10-CM | POA: Diagnosis not present

## 2017-02-25 MED ORDER — TIOTROPIUM BROMIDE-OLODATEROL 2.5-2.5 MCG/ACT IN AERS: 2.0000 | INHALATION_SPRAY | Freq: Every day | RESPIRATORY_TRACT | 0 refills | Status: DC

## 2017-02-25 NOTE — Progress Notes (Signed)
Subjective:     Patient ID: Jose Good Sr., male   DOB: 12-28-1925, 84 y.o.   MRN: [id number]  HPI  HPI: 84 yo former smoker followed for COPD GOLD II and Pulmoinary HTN  Thyroid and Bladder Cancer    TEST   Data Reviewed: PFTs 07/20/12 FVC 2.66 [52%], FEV1 1.85 (53%], F/F 69, TLC 72%, RV/TLC 134%, DLCO 28%. Moderate-severe obstruction, mild restriction with airtrapping, severe diffusion impairment.  PFTs 10/06/16 FVC 2.49 (53%], FEV1 1.91 (57%], F/F 76, TLC 73%, RV/TLC 133%, DLCO 36% Moderate obstructive airway disease, mild restriction with severe diffusion defect  CT abdomen 05/30/13-mild reticular opacities/scarring at the lung bases right greater than left Chest x-ray 05/02/15-hyperinflation, mild lung base scarring Chest x-ray 06/16/14-hyperinflation, no active cardiopulmonary disease. CT scan 10/07/16- no evidence of interstitial lung disease, 4 mm subpleural pulmonary nodule, right upper lobe 1.1 cm groundglass pulmonary nodule. Coronary atherosclerosis, dilated pulmonary artery I reviewed all images personally.  Echocardiogram 10/27/16 LVEF 01-09%, grade 1 diastolic dysfunction severe elevation and PA systolic pressure.  PA peak pressure of 61 RV cavity is mildly dilated, systolic function is normal.   PFT Moderate Obstruction  HST >Severe OSA  CPAP titration study >  01/20/17 Post Hospital follow up : COPD , O2 RF  Pt presents for a post hospital follow up .  Patient was treated for a COPD exacerbation and UTI with urosepsis  last week.  He was treated with antibiotics.  He did have some diastolic dysfunction.  Requiring diuresis.  Patient says since discharge he is feeling better.  Shortness of breath has decreased cough and congestion have also decreased.  Patient had a recent home sleep study that showed severe sleep apnea.  He has been set up for a CPAP titration study.  That is scheduled for 2 weeks.  Patient is on oxygen at bedtime. He was also discharged on  oxygen 2 L with activity.  O2 saturations walking today on room air 88%.  O2 at 1 L O2 saturations 94%.  Patient needs a portable oxygen concentrator as it is too hard for him to carry his tanks.   OV 02/25/2017 Chief Complaint  Patient presents with  . Follow-up    F/U after seeing TP 01/20/17.  Pt's wife states breathing has become better.  Pt does become SOB when going upstairs or going uphill. Denies any cough or CP.   84 year old man who is quite functional has a diagnosis of COPD based on pulmonary function test.  Interstitial lung disease was suspected but ruled out on high-resolution CT chest in September 2018.  COPD is based on pulmonary function test that is mixed obstruction and restriction.  He does have significant pulmonary hypertension based on CT and echo findings.  In January 2019 he was admitted for an exacerbation that sounded like a viral exacerbation but respiratory virus panel was negative.  He also had some deep compensated cor pulmonale and was diuresed.  Right now he is here for follow-up.  He is beginning to feel better.  In fact is much better.  His only main issue is chronic arthritis and carpal tunnel syndrome.  He does have some fatigue but overall he is improved.  Walking desaturation test shows that he does not desaturate.  He plans to return his portable oxygen away.  He still using his nighttime oxygen.  In the interim he did have a sleep apnea study and it shows he has moderate obstructive sleep apnea without central sleep apnea.  Dr.  Alba reviewed this and is recommended 2 L oxygen at night and also CPAP therapy with a moderate fitting mask.  However he and his wife wanted to see Dr. Gwenlyn Perking first before starting all this.  They want to have a discussion.  He continues with his inhaler of long-acting beta agonist and anticholinergic.  Walking desaturation test on 02/25/2017 185 feet x 3 laps on ROOM AIR:  did not desaturate. Rest pulse ox was 99%, final pulse ox was 92%. HR  response 80/min at rest to 104/min at peak exertion. Patient Jose S Vetsch Sr.  Did not Desaturate < 88% . Jose S Durante Sr. did yes  Desaturated </= 3% points. In fact dropped 7 points Burt yes get tachyardic Results for [name], [name] (MRN [id number]) as of 02/25/2017 10:36  Ref. Range 10/06/2016 10:47  FEV1-Post Latest Units: L 1.91  FEV1-%Pred-Post Latest Units: % 57  FEV1-%Change-Post Latest Units: % 4  Results for [name], [name] (MRN [id number]) as of 02/25/2017 10:36  Ref. Range 10/06/2016 10:47  DLCO cor Latest Units: ml/min/mmHg 14.66  DLCO cor % pred Latest Units: % 36     has a past medical history of Arthritis, Bifascicular block, Bladder cancer ([place]), BPH (benign prostatic hyperplasia), Chronic constipation, Chronic restrictive lung disease, COPD mixed type ([place]), Coronary artery disease (CARDIOLOGIST- DR Thompson Grayer), GERD (gastroesophageal reflux disease), History of basal cell carcinoma excision (BILATERAL ARMS), History of colon polyps, thyroid cancer, Hyperlipidemia, Hypertension, Hypothyroidism, Hypothyroidism, postsurgical, Nocturia, Right bundle branch block, Sigmoid diverticulosis, Wears glasses, and Wears glasses.   reports that he quit smoking about 35 years ago. His smoking use included cigarettes. He has a 70.00 pack-year smoking history. he has never used smokeless tobacco.  Past Surgical History:  Procedure Laterality Date  . CARDIOVASCULAR STRESS TEST  08-17-2010   dr allred   Low risk nuclear study with prominent inferobasal thinning vs small prior infarct, no ischemia/  normal LV function and wall motion , ef 63%  . CATARACT EXTRACTION W/ INTRAOCULAR LENS  IMPLANT, BILATERAL    . CYSTOSCOPY W/ RETROGRADES Bilateral 10/08/2014   Procedure: CYSTOSCOPY WITH BILATERAL RETROGRADE PYELOGRAM, BIOPSY, FULGURATION;  Surgeon: Festus Aloe, MD;  [facility];  Service: Urology;  Laterality: Bilateral;  . CYSTOSCOPY  W/ RETROGRADES Bilateral 06/18/2016   Procedure: CYSTOSCOPY WITH RETROGRADE PYELOGRAM;  Surgeon: Festus Aloe, MD;  Location: [facility];  Service: Urology;  Laterality: Bilateral;  . CYSTOSCOPY WITH BIOPSY  12/16/2010   Procedure: CYSTOSCOPY WITH BIOPSY;  Surgeon: Molli Hazard, MD;  Location: [facility];  Service: Urology;  Laterality: N/A;  . CYSTOSCOPY WITH BIOPSY N/A 05/20/2015   Procedure: CYSTOSCOPY WITH BLADDER BIOPSY AND FULGERATION, CYSTOGRAM, BILATERAL RETROGRADES, BILATERAL RENAL WASHINGS;  Surgeon: Festus Aloe, MD;  Location[facility];  Service: Urology;  Laterality: N/A;  . CYSTOSCOPY WITH BIOPSY N/A 06/18/2016   Procedure: CYSTOSCOPY WITH BIOPSY FULGURATION;  Surgeon: Festus Aloe, MD;  Location: [facility];  Service: Urology;  Laterality: N/A;  . CYSTOSCOPY/RETROGRADE/URETEROSCOPY  12/16/2010   Procedure: CYSTOSCOPY/RETROGRADE/URETEROSCOPY;  Surgeon: Molli Hazard, MD;  Location: [facility];  Service: Urology;  Laterality: Right;  bilateral retrograde, right ureteroscopy  . EYE SURGERY     attached lens to iris  . GREEN LIGHT LASER TURP (TRANSURETHRAL RESECTION OF PROSTATE  11-02-2011  . KNEE ARTHROSCOPY Left 11/  2016  . TOTAL THYROIDECTOMY  1991  . TRANSURETHRAL RESECTION OF BLADDER TUMOR  12/16/2010  Procedure: TRANSURETHRAL RESECTION OF BLADDER TUMOR (TURBT);  Surgeon: Molli Hazard, MD;  Location: [facility];  Service: Urology;  Laterality: N/A;    No Known Allergies  Immunization History  Administered Date(s) Administered  . Influenza Whole 10/12/2011  . Influenza, High Dose Seasonal PF 10/04/2016  . Pneumococcal Polysaccharide-23 01/20/2014    Family History  Problem Relation Age of Onset  . Lung cancer Brother   . Thyroid cancer Unknown   . Diabetes Father      Current Outpatient Medications:  .  aspirin EC 81 MG tablet, Take 81 mg by mouth daily., Disp: , Rfl:  .   atorvastatin (LIPITOR) 20 MG tablet, Take 20 mg by mouth daily after breakfast. , Disp: , Rfl:  .  iron polysaccharides (NIFEREX) 150 MG capsule, Take 150 mg by mouth daily., Disp: , Rfl:  .  levothyroxine (SYNTHROID, LEVOTHROID) 175 MCG tablet, Take 175 mcg by mouth daily after breakfast. , Disp: , Rfl:  .  nitroGLYCERIN (NITRODUR - DOSED IN MG/24 HR) 0.1 mg/hr patch, Place 0.1 mg onto the skin daily., Disp: , Rfl:  .  pantoprazole (PROTONIX) 40 MG tablet, Take 1 tablet (40 mg total) by mouth daily. (Patient taking differently: Take 40 mg by mouth daily after breakfast. ), Disp: 60 tablet, Rfl: 0 .  polyethylene glycol powder (GLYCOLAX/MIRALAX) powder, Take 17 g by mouth daily as needed (for constipation)., Disp: , Rfl:  .  testosterone enanthate (DELATESTRYL) 200 MG/ML injection, Inject 200 mg into the muscle every 28 (twenty-eight) days. For IM use only, Disp: , Rfl:  .  Tiotropium Bromide-Olodaterol (STIOLTO RESPIMAT) 2.5-2.5 MCG/ACT AERS, Inhale 2 puffs into the lungs daily., Disp: 1 Inhaler, Rfl: 5 .  triamterene-hydrochlorothiazide (MAXZIDE-25) 37.5-25 MG tablet, Take 1 tablet by mouth daily., Disp: , Rfl: 0 .  vitamin B-12 (CYANOCOBALAMIN) 1000 MCG tablet, Take 1,000 mcg by mouth 3 (three) times a week., Disp: , Rfl:  .  Vitamin D, Ergocalciferol, (DRISDOL) 50000 units CAPS capsule, Take 50,000 Units by mouth every Monday. , Disp: , Rfl:  .  nitroGLYCERIN (NITROSTAT) 0.4 MG SL tablet, Place 1 tablet (0.4 mg total) under the tongue every 5 (five) minutes as needed for chest pain. (Patient not taking: Reported on 02/25/2017), Disp: 30 tablet, Rfl: 12    Review of Systems     Objective:   Physical Exam  Constitutional: He is oriented to person, place, and time. He appears well-developed and well-nourished. No distress.  HENT:  Head: Normocephalic and atraumatic.  Right Ear: External ear normal.  Left Ear: External ear normal.  Mouth/Throat: Oropharynx is clear and moist. No oropharyngeal  exudate.  Eyes: Conjunctivae and EOM are normal. Pupils are equal, round, and reactive to light. Right eye exhibits no discharge. Left eye exhibits no discharge. No scleral icterus.  Neck: Normal range of motion. Neck supple. No JVD present. No tracheal deviation present. No thyromegaly present.  Cardiovascular: Normal rate, regular rhythm and intact distal pulses. Exam reveals no gallop and no friction rub.  No murmur heard. Pulmonary/Chest: Effort normal and breath sounds normal. No respiratory distress. He has no wheezes. He has no rales. He exhibits no tenderness.  Possible mild RLL cracles+  Abdominal: Soft. Bowel sounds are normal. He exhibits no distension and no mass. There is no tenderness. There is no rebound and no guarding.  Musculoskeletal: Normal range of motion. He exhibits no edema or tenderness.  Lymphadenopathy:    He has no cervical adenopathy.  Neurological: He is alert  and oriented to person, place, and time. He has normal reflexes. No cranial nerve deficit. Coordination normal.  Skin: Skin is warm and dry. No rash noted. He is not diaphoretic. No erythema. No pallor.  Psychiatric: He has a normal mood and affect. His behavior is normal. Judgment and thought content normal.  Nursing note and vitals reviewed.  Vitals:   02/25/17 1023  BP: (!) 152/70  Pulse: 89  SpO2: 93%  Weight: 193 lb 6.4 oz (87.7 kg)  Height: 6\' 4"  (1.93 m)    Estimated body mass index is 23.54 kg/m as calculated from the following:   Height as of this encounter: 6\' 4"  (1.93 m).   Weight as of this encounter: 193 lb 6.4 oz (87.7 kg).     Assessment:       ICD-10-CM   1. COPD GOLD II J44.9   2. OSA (obstructive sleep apnea) G47.33        Plan:     COPD  - stable and improved after recent flare up  - continue stiolto; take some samples  - you do not need portable o2 anymore - agree that rehab is not a good option for you given arthritis  OSA  - new diagnosis  - respect that you  want to meet Dr Elsworth Soho first before stating treatment - contnue night o2  - hold off on starting CPAP till you see Dr Elsworth Soho  Followup  - Dr Elsworth Soho next few to several week for sleep apnea discussion  - return to see Dr Chase Caller in 3 months or sooner   Dr. Brand Males, M.D., [facility].C.P Pulmonary and Critical Care Medicine Staff Physician, [place] Director - Interstitial Lung Disease  Program  Pulmonary [place] at [place], [place], [address]  Pager: [phone], If no answer or between  15:00h - 7:00h: call [phone] Telephone: [phone]

## 2017-02-25 NOTE — Addendum Note (Signed)
Addended by: Lorretta Harp on: 02/25/2017 11:05 AM   Modules accepted: Orders

## 2017-02-25 NOTE — Addendum Note (Signed)
Addended by: Lorretta Harp on: 02/25/2017 11:04 AM   Modules accepted: Orders

## 2017-02-25 NOTE — Patient Instructions (Addendum)
ICD-10-CM   1. COPD GOLD II J44.9   2. OSA (obstructive sleep apnea) G47.33    COPD  - stable and improved after recent flare up  - continue stiolto; take some samples  - you do not need portable o2 anymore - agree that rehab is not a good option for you given arthritis  OSA  - new diagnosis  - respect that you want to meet Dr Elsworth Soho first before stating treatment - continue night o2 as before  - hold off on starting CPAP till you see Dr Elsworth Soho  Followup  - Dr Elsworth Soho next few to several week for sleep apnea discussion  - return to see Dr Chase Caller in 3 months or sooner

## 2017-02-28 DIAGNOSIS — G5602 Carpal tunnel syndrome, left upper limb: Secondary | ICD-10-CM | POA: Diagnosis not present

## 2017-02-28 DIAGNOSIS — G5622 Lesion of ulnar nerve, left upper limb: Secondary | ICD-10-CM | POA: Diagnosis not present

## 2017-02-28 DIAGNOSIS — G5601 Carpal tunnel syndrome, right upper limb: Secondary | ICD-10-CM | POA: Diagnosis not present

## 2017-03-02 ENCOUNTER — Ambulatory Visit: Payer: Medicare Other | Admitting: Internal Medicine

## 2017-03-02 ENCOUNTER — Encounter: Payer: Self-pay | Admitting: Internal Medicine

## 2017-03-02 VITALS — BP 118/74 | HR 82 | Ht 76.0 in | Wt 191.2 lb

## 2017-03-02 DIAGNOSIS — G4733 Obstructive sleep apnea (adult) (pediatric): Secondary | ICD-10-CM

## 2017-03-02 NOTE — Assessment & Plan Note (Signed)
History and physical exam including comments from his wife do not suggest the moderately severe obstructive sleep apnea recorded on his sleep study.  He has had significant symptomatic improvement after treatment for heart failure and urinary infection in January. Plan-if we can get another sleep study done, to recheck this diagnosis before committing to CPAP.  Sitter self-pay for a home sleep study if insurance will not cover.  Otherwise we will go ahead with a therapeutic trial of CPAP, which we discussed in detail.

## 2017-03-02 NOTE — Patient Instructions (Addendum)
Order- schedule unattended home sleep test   Dx OSA Do not wear your home oxygen during this home sleep test.   Please call me for results and recommendation about 2 weeks after this new sleep test.  Other wise continue O2  At 1 Liter for sleep  If the home test again shows obstructive sleep apnea, then we will go ahead and order CPAP.

## 2017-03-02 NOTE — Progress Notes (Signed)
03/02/17-84 year old male former smoker followed at this office for Pulmonary-   COPD GOLD II and Pulmonary HTN  Complicated by thyroid and Bladder Cancer  Sleep Consult: Per MR. Pt had sleep study and CPAP titration. Order was placed for CPAP but patient wanted to speak with MD today before setting up.  O2 1 L sleep and exertion HST 11/25/16-AHI 27.3/hour, desaturation to 71%, body weight 199 pounds Sleep study apparently ordered because of concerns about oxygen status.  Wife says he only snores some when on his back without witnessed apneas. He does not recognize a lot of daytime sleepiness. ENT surgery limited to tonsils.  He has been wearing oxygen 1 L for sleep Breathing currently is at baseline with little cough or wheeze. Walk test on room air 02/25/17-room air nadir 84% with maximum heart rate 104.  Prior to Admission medications   Medication Sig Start Date End Date Taking? Authorizing Provider  aspirin EC 81 MG tablet Take 81 mg by mouth daily.   Yes [provider]  atorvastatin (LIPITOR) 20 MG tablet Take 20 mg by mouth daily after breakfast.    Yes [provider]  iron polysaccharides (NIFEREX) 150 MG capsule Take 150 mg by mouth daily.   Yes [provider]  levothyroxine (SYNTHROID, LEVOTHROID) 175 MCG tablet Take 175 mcg by mouth daily after breakfast.    Yes [provider]  nitroGLYCERIN (NITRODUR - DOSED IN MG/24 HR) 0.1 mg/hr patch Place 0.1 mg onto the skin daily.   Yes [provider]  nitroGLYCERIN (NITROSTAT) 0.4 MG SL tablet Place 1 tablet (0.4 mg total) under the tongue every 5 (five) minutes as needed for chest pain. 07/03/14  Yes Allred, Jeneen Rinks, MD  pantoprazole (PROTONIX) 40 MG tablet Take 1 tablet (40 mg total) by mouth daily. Patient taking differently: Take 40 mg by mouth daily after breakfast.  07/03/14  Yes Allred, Jeneen Rinks, MD  polyethylene glycol powder (GLYCOLAX/MIRALAX) powder Take 17 g by mouth daily as needed (for  constipation).   Yes [provider]  testosterone enanthate (DELATESTRYL) 200 MG/ML injection Inject 200 mg into the muscle every 28 (twenty-eight) days. For IM use only   Yes [provider]  Tiotropium Bromide-Olodaterol (STIOLTO RESPIMAT) 2.5-2.5 MCG/ACT AERS Inhale 2 puffs into the lungs daily. 09/30/16  Yes Mannam, Praveen, MD  triamterene-hydrochlorothiazide (MAXZIDE-25) 37.5-25 MG tablet Take 1 tablet by mouth daily. 03/10/16  Yes [provider]  vitamin B-12 (CYANOCOBALAMIN) 1000 MCG tablet Take 1,000 mcg by mouth 3 (three) times a week.   Yes [provider]  Vitamin D, Ergocalciferol, (DRISDOL) 50000 units CAPS capsule Take 50,000 Units by mouth every Monday.    Yes [provider]   Past Medical History:  Diagnosis Date  . Arthritis    BACK  . Bifascicular block   . Bladder cancer ([place])   . BPH (benign prostatic hyperplasia)   . Chronic constipation   . Chronic restrictive lung disease   . COPD mixed type ([place])   . Coronary artery disease CARDIOLOGIST- DR Thompson Grayer   STRESS TEST 08-17-10 IN [facility] (LOW RISK ,  NO ISCHEMIA)  . GERD (gastroesophageal reflux disease)   . History of basal cell carcinoma excision BILATERAL ARMS  . History of colon polyps   . Hx of thyroid cancer    s/p total thyroidectomy,  . Hyperlipidemia   . Hypertension   . Hypothyroidism   . Hypothyroidism, postsurgical   . Nocturia   . Right bundle branch block   .  Sigmoid diverticulosis   . Wears glasses   . Wears glasses    Past Surgical History:  Procedure Laterality Date  . CARDIOVASCULAR STRESS TEST  08-17-2010   dr allred   Low risk nuclear study with prominent inferobasal thinning vs small prior infarct, no ischemia/  normal LV function and wall motion , ef 63%  . CATARACT EXTRACTION W/ INTRAOCULAR LENS  IMPLANT, BILATERAL    . CYSTOSCOPY W/ RETROGRADES Bilateral 10/08/2014   Procedure: CYSTOSCOPY WITH BILATERAL RETROGRADE PYELOGRAM, BIOPSY,  FULGURATION;  Surgeon: Festus Aloe, MD;  [facility]: [facility];  Service: Urology;  Laterality: Bilateral;  . CYSTOSCOPY W/ RETROGRADES Bilateral 06/18/2016   Procedure: CYSTOSCOPY WITH RETROGRADE PYELOGRAM;  Surgeon: Festus Aloe, MD;  Location: [facility];  Service: Urology;  Laterality: Bilateral;  . CYSTOSCOPY WITH BIOPSY  12/16/2010   Procedure: CYSTOSCOPY WITH BIOPSY;  Surgeon: Molli Hazard, MD;  Location: [facility];  Service: Urology;  Laterality: N/A;  . CYSTOSCOPY WITH BIOPSY N/A 05/20/2015   Procedure: CYSTOSCOPY WITH BLADDER BIOPSY AND FULGERATION, CYSTOGRAM, BILATERAL RETROGRADES, BILATERAL RENAL WASHINGS;  Surgeon: Festus Aloe, MD;  Location: [facility];  Service: Urology;  Laterality: N/A;  . CYSTOSCOPY WITH BIOPSY N/A 06/18/2016   Procedure: CYSTOSCOPY WITH BIOPSY FULGURATION;  Surgeon: Festus Aloe, MD;  Location: [facility];  Service: Urology;  Laterality: N/A;  . CYSTOSCOPY/RETROGRADE/URETEROSCOPY  12/16/2010   Procedure: CYSTOSCOPY/RETROGRADE/URETEROSCOPY;  Surgeon: Molli Hazard, MD;  Location: [facility];  Service: Urology;  Laterality: Right;  bilateral retrograde, right ureteroscopy  . EYE SURGERY     attached lens to iris  . GREEN LIGHT LASER TURP (TRANSURETHRAL RESECTION OF PROSTATE  11-02-2011  . KNEE ARTHROSCOPY Left 11/  2016  . TOTAL THYROIDECTOMY  1991  . TRANSURETHRAL RESECTION OF BLADDER TUMOR  12/16/2010   Procedure: TRANSURETHRAL RESECTION OF BLADDER TUMOR (TURBT);  Surgeon: Molli Hazard, MD;  Location: [facility];  Service: Urology;  Laterality: N/A;   Family History  Problem Relation Age of Onset  . Lung cancer Brother   . Thyroid cancer Unknown   . Diabetes Father    Social History   Socioeconomic History  . Marital status: Married    Spouse name: Not on file  . Number of children: Not on file  . Years of education: Not on file  . Highest  education level: Not on file  Social Needs  . Financial resource strain: Not on file  . Food insecurity - worry: Not on file  . Food insecurity - inability: Not on file  . Transportation needs - medical: Not on file  . Transportation needs - non-medical: Not on file  Occupational History  . Occupation: retired  Tobacco Use  . Smoking status: Former Smoker    Packs/day: 2.00    Years: 35.00    Pack years: 70.00    Types: Cigarettes    Last attempt to quit: 01/11/1982    Years since quitting: 35.1  . Smokeless tobacco: Never Used  Substance and Sexual Activity  . Alcohol use: Yes    Alcohol/week: 0.0 oz    Comment: 6-oz vodka per day   . Drug use: No  . Sexual activity: No  Other Topics Concern  . Not on file  Social History Narrative   Lives in [place] with his spouse.   Retired from [employer].   ROS-see HPI   + = positive Constitutional:    weight loss, night sweats, fevers, chills, fatigue,  lassitude. HEENT:    headaches, difficulty swallowing, tooth/dental problems, sore throat,       sneezing, itching, ear ache, nasal congestion, post nasal drip, snoring CV:    chest pain, orthopnea, PND, swelling in lower extremities, anasarca,                                  dizziness, palpitations Resp:   +shortness of breath with exertion or at rest.                productive cough,   non-productive cough, coughing up of blood.              change in color of mucus.  wheezing.   Skin:    rash or lesions. GI:  No-   heartburn, indigestion, abdominal pain, nausea, vomiting, diarrhea,                 change in bowel habits, loss of appetite GU: dysuria, change in color of urine, no urgency or frequency.   flank pain. MS:   joint pain, stiffness, decreased range of motion, back pain. Neuro-     nothing unusual Psych:  change in mood or affect.  depression or anxiety.   memory loss.  OBJ- Physical Exam General- Alert, Oriented, Affect-appropriate, Distress- none acute,+ tall thin  man Skin- rash-none, lesions- none, excoriation- none Lymphadenopathy- none Head- atraumatic            Eyes- Gross vision intact, PERRLA, conjunctivae and secretions clear            Ears- Hearing, canals-normal            Nose- Clear, no-Septal dev, mucus, polyps, erosion, perforation             Throat- Mallampati II-III , mucosa clear , drainage- none, tonsils- atrophic Neck- flexible , trachea midline, no stridor , thyroid nl, carotid no bruit Chest - symmetrical excursion , unlabored           Heart/CV- RRR , no murmur , no gallop  , no rub, nl s1 s2                           - JVD- none , edema- none, stasis changes- none, varices- none           Lung- clear to P&A, wheeze- none, cough- none , dullness-none, rub- none           Chest wall-  Abd-  Br/ Gen/ Rectal- Not done, not indicated Extrem- cyanosis- none, clubbing, none, atrophy- none, strength- nl Neuro- grossly intact to observation

## 2017-03-03 ENCOUNTER — Ambulatory Visit: Payer: Medicare Other | Admitting: Internal Medicine

## 2017-03-04 DIAGNOSIS — M1712 Unilateral primary osteoarthritis, left knee: Secondary | ICD-10-CM | POA: Diagnosis not present

## 2017-03-04 DIAGNOSIS — G5602 Carpal tunnel syndrome, left upper limb: Secondary | ICD-10-CM | POA: Diagnosis not present

## 2017-03-04 DIAGNOSIS — G5622 Lesion of ulnar nerve, left upper limb: Secondary | ICD-10-CM | POA: Diagnosis not present

## 2017-03-04 DIAGNOSIS — G5601 Carpal tunnel syndrome, right upper limb: Secondary | ICD-10-CM | POA: Diagnosis not present

## 2017-03-07 DIAGNOSIS — C44319 Basal cell carcinoma of skin of other parts of face: Secondary | ICD-10-CM | POA: Diagnosis not present

## 2017-03-07 DIAGNOSIS — L565 Disseminated superficial actinic porokeratosis (DSAP): Secondary | ICD-10-CM | POA: Diagnosis not present

## 2017-03-07 DIAGNOSIS — Z85828 Personal history of other malignant neoplasm of skin: Secondary | ICD-10-CM | POA: Diagnosis not present

## 2017-03-07 DIAGNOSIS — D485 Neoplasm of uncertain behavior of skin: Secondary | ICD-10-CM | POA: Diagnosis not present

## 2017-03-07 DIAGNOSIS — C44519 Basal cell carcinoma of skin of other part of trunk: Secondary | ICD-10-CM | POA: Diagnosis not present

## 2017-03-14 DIAGNOSIS — N179 Acute kidney failure, unspecified: Secondary | ICD-10-CM | POA: Diagnosis not present

## 2017-03-14 DIAGNOSIS — N3000 Acute cystitis without hematuria: Secondary | ICD-10-CM | POA: Diagnosis not present

## 2017-03-16 ENCOUNTER — Ambulatory Visit: Payer: Medicare Other | Admitting: Gastroenterology

## 2017-03-16 ENCOUNTER — Encounter: Payer: Self-pay | Admitting: Gastroenterology

## 2017-03-16 ENCOUNTER — Other Ambulatory Visit (INDEPENDENT_AMBULATORY_CARE_PROVIDER_SITE_OTHER): Payer: Medicare Other

## 2017-03-16 VITALS — BP 130/60 | HR 68 | Ht 76.0 in | Wt 186.2 lb

## 2017-03-16 DIAGNOSIS — D509 Iron deficiency anemia, unspecified: Secondary | ICD-10-CM

## 2017-03-16 LAB — IGA: IGA: 407 mg/dL — AB (ref 68–378)

## 2017-03-16 LAB — CBC WITH DIFFERENTIAL/PLATELET
BASOS PCT: 0.7 % (ref 0.0–3.0)
Basophils Absolute: 0 10*3/uL (ref 0.0–0.1)
EOS ABS: 0.2 10*3/uL (ref 0.0–0.7)
EOS PCT: 3 % (ref 0.0–5.0)
HCT: 38.5 % — ABNORMAL LOW (ref 39.0–52.0)
Hemoglobin: 11.8 g/dL — ABNORMAL LOW (ref 13.0–17.0)
LYMPHS ABS: 0.9 10*3/uL (ref 0.7–4.0)
Lymphocytes Relative: 13.5 % (ref 12.0–46.0)
MCHC: 30.8 g/dL (ref 30.0–36.0)
MCV: 76.6 fl — ABNORMAL LOW (ref 78.0–100.0)
MONO ABS: 0.6 10*3/uL (ref 0.1–1.0)
Monocytes Relative: 9.5 % (ref 3.0–12.0)
NEUTROS PCT: 73.3 % (ref 43.0–77.0)
Neutro Abs: 4.8 10*3/uL (ref 1.4–7.7)
Platelets: 260 10*3/uL (ref 150.0–400.0)
RBC: 5.03 Mil/uL (ref 4.22–5.81)
RDW: 24.6 % — AB (ref 11.5–15.5)
WBC: 6.6 10*3/uL (ref 4.0–10.5)

## 2017-03-16 LAB — IBC PANEL
IRON: 24 ug/dL — AB (ref 42–165)
SATURATION RATIOS: 5.5 % — AB (ref 20.0–50.0)
Transferrin: 309 mg/dL (ref 212.0–360.0)

## 2017-03-16 LAB — FERRITIN: Ferritin: 8.9 ng/mL — ABNORMAL LOW (ref 22.0–322.0)

## 2017-03-16 NOTE — Patient Instructions (Signed)
Your physician has requested that you go to the basement for  lab work before leaving today:  It has been recommended to you by your physician that you have a(n) colonoscopy and endoscopy completed. Per your request, we did not schedule the procedure(s) today. Please contact our office at [phone] should you decide to have the procedure completed.   Please schedule appointment with our office after your carpal tunnel surgies.    If you are age 82 or older, your body mass index should be between 23-30. Your Body mass index is 22.67 kg/m. If this is out of the aforementioned range listed, please consider follow up with your Primary Care Provider.  If you are age 6 or younger, your body mass index should be between 19-25. Your Body mass index is 22.67 kg/m. If this is out of the aformentioned range listed, please consider follow up with your Primary Care Provider.

## 2017-03-16 NOTE — Progress Notes (Signed)
History of Present Illness: This is an 84 year old male referred by [name], MD for the evaluation of iron deficiency anemia.  He is accompanied by his wife. Hemoglobin In January Hb=8.1, iron saturation=5%, iron=18, ferritin=29.  He was started on iron once daily by Dr. Sharlett Iles.  Patient notes intermittent mild constipation for several years treated with milk of magnesia as needed and the symptoms have not changed.  Dr. Shon Baton records state that he had a colonoscopy performed in 2010 with 2 colon polyps found. The colonoscopy and path records are not available to review today.  Patient relates that Dr. Amedeo Plenty performed his last colonoscopy.Denies weight loss, abdominal pain, diarrhea, change in stool caliber, melena, hematochezia, nausea, vomiting, dysphagia, reflux symptoms, chest pain.     No Known Allergies Outpatient Medications Prior to Visit  Medication Sig Dispense Refill  . aspirin EC 81 MG tablet Take 81 mg by mouth daily.    [name] atorvastatin (LIPITOR) 20 MG tablet Take 20 mg by mouth daily after breakfast.     . doxycycline (ADOXA) 100 MG tablet Take 100 mg by mouth 2 (two) times daily.    . iron polysaccharides (NIFEREX) 150 MG capsule Take 150 mg by mouth daily.    [name] levothyroxine (SYNTHROID, LEVOTHROID) 175 MCG tablet Take 175 mcg by mouth daily after breakfast.     . nitroGLYCERIN (NITRODUR - DOSED IN MG/24 HR) 0.1 mg/hr patch Place 0.1 mg onto the skin daily.    . nitroGLYCERIN (NITROSTAT) 0.4 MG SL tablet Place 1 tablet (0.4 mg total) under the tongue every 5 (five) minutes as needed for chest pain. 30 tablet 12  . pantoprazole (PROTONIX) 40 MG tablet Take 1 tablet (40 mg total) by mouth daily. (Patient taking differently: Take 40 mg by mouth daily after breakfast. ) 60 tablet 0  . polyethylene glycol powder (GLYCOLAX/MIRALAX) powder Take 17 g by mouth daily as needed (for constipation).    [name] testosterone enanthate (DELATESTRYL) 200 MG/ML injection Inject 200 mg  into the muscle every 28 (twenty-eight) days. For IM use only    . Tiotropium Bromide-Olodaterol (STIOLTO RESPIMAT) 2.5-2.5 MCG/ACT AERS Inhale 2 puffs into the lungs daily. 1 Inhaler 5  . triamterene-hydrochlorothiazide (MAXZIDE-25) 37.5-25 MG tablet Take 1 tablet by mouth daily.  0  . vitamin B-12 (CYANOCOBALAMIN) 1000 MCG tablet Take 1,000 mcg by mouth 3 (three) times a week.    . Vitamin D, Ergocalciferol, (DRISDOL) 50000 units CAPS capsule Take 50,000 Units by mouth every Monday.      No facility-administered medications prior to visit.    Past Medical History:  Diagnosis Date  . Arthritis    BACK  . Bifascicular block   . Bladder cancer ([place])   . BPH (benign prostatic hyperplasia)   . Chronic constipation   . Chronic restrictive lung disease   . COPD mixed type ([place])   . Coronary artery disease CARDIOLOGIST- DR Thompson Grayer   STRESS TEST 08-17-10 IN EPIC (LOW RISK ,  NO ISCHEMIA)  . GERD (gastroesophageal reflux disease)   . History of basal cell carcinoma excision BILATERAL ARMS  . History of colon polyps 2004   TA  . Hx of thyroid cancer    s/p total thyroidectomy,  . Hyperlipidemia   . Hypertension   . Hypothyroidism   . Hypothyroidism, postsurgical   . Iron deficiency anemia   . Nocturia   . Right bundle branch block   . Sigmoid diverticulosis   . Wears glasses   . Wears glasses  Past Surgical History:  Procedure Laterality Date  . CARDIOVASCULAR STRESS TEST  08-17-2010   dr allred   Low risk nuclear study with prominent inferobasal thinning vs small prior infarct, no ischemia/  normal LV function and wall motion , ef 63%  . CATARACT EXTRACTION W/ INTRAOCULAR LENS  IMPLANT, BILATERAL    . CYSTOSCOPY W/ RETROGRADES Bilateral 10/08/2014   Procedure: CYSTOSCOPY WITH BILATERAL RETROGRADE PYELOGRAM, BIOPSY, FULGURATION;  Surgeon: Festus Aloe, MD;  Location[facility];  Service: Urology;  Laterality: Bilateral;  . CYSTOSCOPY W/ RETROGRADES  Bilateral 06/18/2016   Procedure: CYSTOSCOPY WITH RETROGRADE PYELOGRAM;  Surgeon: Festus Aloe, MD;  Location: [facility];  Service: Urology;  Laterality: Bilateral;  . CYSTOSCOPY WITH BIOPSY  12/16/2010   Procedure: CYSTOSCOPY WITH BIOPSY;  Surgeon: Molli Hazard, MD;  Location: [facility];  Service: Urology;  Laterality: N/A;  . CYSTOSCOPY WITH BIOPSY N/A 05/20/2015   Procedure: CYSTOSCOPY WITH BLADDER BIOPSY AND FULGERATION, CYSTOGRAM, BILATERAL RETROGRADES, BILATERAL RENAL WASHINGS;  Surgeon: Festus Aloe, MD;  Location: [facility];  Service: Urology;  Laterality: N/A;  . CYSTOSCOPY WITH BIOPSY N/A 06/18/2016   Procedure: CYSTOSCOPY WITH BIOPSY FULGURATION;  Surgeon: Festus Aloe, MD;  Location: [facility];  Service: Urology;  Laterality: N/A;  . CYSTOSCOPY/RETROGRADE/URETEROSCOPY  12/16/2010   Procedure: CYSTOSCOPY/RETROGRADE/URETEROSCOPY;  Surgeon: Molli Hazard, MD;  Location[facility];  Service: Urology;  Laterality: Right;  bilateral retrograde, right ureteroscopy  . EYE SURGERY     attached lens to iris  . GREEN LIGHT LASER TURP (TRANSURETHRAL RESECTION OF PROSTATE  11-02-2011  . KNEE ARTHROSCOPY Left 11/  2016  . TOTAL THYROIDECTOMY  1991  . TRANSURETHRAL RESECTION OF BLADDER TUMOR  12/16/2010   Procedure: TRANSURETHRAL RESECTION OF BLADDER TUMOR (TURBT);  Surgeon: Molli Hazard, MD;  Location: [facility];  Service: Urology;  Laterality: N/A;   Social History   Socioeconomic History  . Marital status: Married    Spouse name: None  . Number of children: None  . Years of education: None  . Highest education level: None  Social Needs  . Financial resource strain: None  . Food insecurity - worry: None  . Food insecurity - inability: None  . Transportation needs - medical: None  . Transportation needs - non-medical: None  Occupational History  . Occupation: retired  Tobacco Use  . Smoking  status: Former Smoker    Packs/day: 2.00    Years: 35.00    Pack years: 70.00    Types: Cigarettes    Last attempt to quit: 01/11/1982    Years since quitting: 35.2  . Smokeless tobacco: Never Used  Substance and Sexual Activity  . Alcohol use: Yes    Alcohol/week: 0.0 oz    Comment: 6-oz vodka per day   . Drug use: No  . Sexual activity: No  Other Topics Concern  . None  Social History Narrative   Lives in [place] with his spouse.   Retired from [employer].   Family History  Problem Relation Age of Onset  . Lung cancer Brother   . Thyroid cancer Unknown   . Diabetes Father       Review of Systems: Pertinent positive and negative review of systems were noted in the above HPI section. All other review of systems were otherwise negative.   Physical Exam: General: Well developed, well nourished, no acute distress Head: Normocephalic and atraumatic Eyes:  sclerae anicteric, EOMI Ears: Normal auditory acuity  Mouth: No deformity or lesions Neck: Supple, no masses or thyromegaly Lungs: Clear throughout to auscultation Heart: Regular rate and rhythm; no murmurs, rubs or bruits Abdomen: Soft, non tender and non distended. No masses, hepatosplenomegaly or hernias noted. Normal Bowel sounds Musculoskeletal: Symmetrical with no gross deformities  Skin: No lesions on visible extremities Pulses:  Normal pulses noted Extremities: No clubbing, cyanosis, edema or deformities noted Neurological: Alert oriented x 4, grossly nonfocal Cervical Nodes:  No significant cervical adenopathy Inguinal Nodes: No significant inguinal adenopathy Psychological:  Alert and cooperative. Normal mood and affect  Assessment and Recommendations:  1.  Iron deficiency anemia.  Rule out gastrointestinal sources of blood loss due to colorectal neoplasms, AVMs, ulcers, etc.  R/O celiac disease.  Continue iron once daily. Check tTG, IgA, CBC today. Recommended proceeding with colonoscopy and EGD however the  patient declined.  The risks of significant gastrointestinal disease including the possibility of colon cancer and was outlined to the patient and he understands the risk of not proceeding with endoscopic evaluation at this time and he declines.  He has right carpal tunnel surgery planned this Friday and states he wants to have his left carpal tunnel surgery done as soon as possible after that.  He states that he will reconsider colonoscopy and EGD after his recovery from carpal tunnel surgeries.  Attempt to obtain records from prior colonoscopy.  Patient advised to return for an office appointment with me after his carpal tunnel surgeries are completed.     cc: [name], MD [id number] [place], [place] [phone]

## 2017-03-18 DIAGNOSIS — G5601 Carpal tunnel syndrome, right upper limb: Secondary | ICD-10-CM | POA: Diagnosis not present

## 2017-03-18 LAB — TISSUE TRANSGLUTAMINASE, IGA: (tTG) Ab, IgA: 1 U/mL

## 2017-03-22 ENCOUNTER — Telehealth: Payer: Self-pay | Admitting: *Deleted

## 2017-03-22 NOTE — Telephone Encounter (Signed)
Dr Fuller Plan,  This gentleman saw you in the office 03-16-2017 for IDA and he is scheduled for a colon/ egd 4-11 Thursday.  In reviewing his chart for PV, I see at his last pulmonary visit it's documented he wears O2 1 liter every night for sleep.  Do you want him to have his procedures in the [facility] or [facility]?  He is scheduled for the [place].    Please advise and thanks for your time, Marijean Niemann

## 2017-03-23 ENCOUNTER — Other Ambulatory Visit: Payer: Medicare Other

## 2017-03-25 NOTE — Telephone Encounter (Signed)
Thanks. Sheri, can you scheduled this for me at [facility]?   Thanks a bunch Lelan Pons

## 2017-03-25 NOTE — Telephone Encounter (Signed)
Lelan Pons,  June hospital schedule not out yet.  I will let you know as soon as I can schedule it.

## 2017-03-25 NOTE — Telephone Encounter (Signed)
Given O2 use please schedule colon/egd at hospital.

## 2017-03-28 ENCOUNTER — Other Ambulatory Visit: Payer: Self-pay

## 2017-03-28 DIAGNOSIS — D509 Iron deficiency anemia, unspecified: Secondary | ICD-10-CM

## 2017-03-28 DIAGNOSIS — N3020 Other chronic cystitis without hematuria: Secondary | ICD-10-CM | POA: Diagnosis not present

## 2017-03-28 NOTE — Telephone Encounter (Signed)
Patient has been scheduled at [facility] for 06/13/17 9:30.  He will need to arrive at 8:00 am.

## 2017-03-29 NOTE — Telephone Encounter (Signed)
LMOM for pt to call back.  Will explain why he will be done at [facility] and also to try to schedule his PV closer to procedure date

## 2017-03-30 ENCOUNTER — Other Ambulatory Visit: Payer: Self-pay | Admitting: *Deleted

## 2017-03-30 DIAGNOSIS — J449 Chronic obstructive pulmonary disease, unspecified: Secondary | ICD-10-CM

## 2017-03-30 NOTE — Telephone Encounter (Signed)
Appointment made for Wed. Am 06/08/17 (per pt's request). Colon at [facility] 06/13/17. Pt aware of new appointments dates.

## 2017-04-01 DIAGNOSIS — E291 Testicular hypofunction: Secondary | ICD-10-CM | POA: Diagnosis not present

## 2017-04-01 DIAGNOSIS — Z4789 Encounter for other orthopedic aftercare: Secondary | ICD-10-CM | POA: Diagnosis not present

## 2017-04-01 DIAGNOSIS — M170 Bilateral primary osteoarthritis of knee: Secondary | ICD-10-CM | POA: Diagnosis not present

## 2017-04-06 DIAGNOSIS — G4733 Obstructive sleep apnea (adult) (pediatric): Secondary | ICD-10-CM | POA: Diagnosis not present

## 2017-04-07 DIAGNOSIS — G4733 Obstructive sleep apnea (adult) (pediatric): Secondary | ICD-10-CM | POA: Diagnosis not present

## 2017-04-08 ENCOUNTER — Other Ambulatory Visit: Payer: Self-pay | Admitting: *Deleted

## 2017-04-08 DIAGNOSIS — G4733 Obstructive sleep apnea (adult) (pediatric): Secondary | ICD-10-CM

## 2017-04-19 DIAGNOSIS — D649 Anemia, unspecified: Secondary | ICD-10-CM | POA: Diagnosis not present

## 2017-04-19 DIAGNOSIS — R7301 Impaired fasting glucose: Secondary | ICD-10-CM | POA: Diagnosis not present

## 2017-04-19 DIAGNOSIS — I10 Essential (primary) hypertension: Secondary | ICD-10-CM | POA: Diagnosis not present

## 2017-04-19 DIAGNOSIS — R0602 Shortness of breath: Secondary | ICD-10-CM | POA: Diagnosis not present

## 2017-04-21 ENCOUNTER — Encounter: Payer: Medicare Other | Admitting: Gastroenterology

## 2017-04-22 ENCOUNTER — Telehealth: Payer: Self-pay | Admitting: Internal Medicine

## 2017-04-22 NOTE — Telephone Encounter (Signed)
Spoke with patient. He is requesting results from his sleep study. Advised patient that I would send a message over to CY to get results.   Dr. Annamaria Boots, please advise on patient's HST from 04/08/17. Thanks!

## 2017-04-22 NOTE — Telephone Encounter (Signed)
Called pt and spoke with him and his wife letting them know the results of the HST.  Per pt, he will continue doing the O2 at night as he is currently doing and will just wait until upcoming OV with CY to discuss the other options at next OV.  Nothing further needed at this time.

## 2017-04-22 NOTE — Telephone Encounter (Signed)
The home sleep test showed mild obstructive sleep apnea, averaging 10.9 apneas/ hour, with drops in blood oxygen level. We can either continue with the home oxygen used during sleep, which he is doing now, or change to CPAP. More testing would be needed before we could prescribe both CPAP and oxygen together. We can also consider an oral appliance. My suggestion would be that he continue the oxygen, and we will discuss it when he returns for next office visit.

## 2017-05-06 DIAGNOSIS — J449 Chronic obstructive pulmonary disease, unspecified: Secondary | ICD-10-CM | POA: Diagnosis not present

## 2017-05-06 DIAGNOSIS — D6489 Other specified anemias: Secondary | ICD-10-CM | POA: Diagnosis not present

## 2017-05-06 DIAGNOSIS — R5383 Other fatigue: Secondary | ICD-10-CM | POA: Diagnosis not present

## 2017-05-06 DIAGNOSIS — E038 Other specified hypothyroidism: Secondary | ICD-10-CM | POA: Diagnosis not present

## 2017-05-09 ENCOUNTER — Ambulatory Visit: Payer: Medicare Other | Admitting: Adult Health

## 2017-05-09 ENCOUNTER — Ambulatory Visit (INDEPENDENT_AMBULATORY_CARE_PROVIDER_SITE_OTHER)
Admission: RE | Admit: 2017-05-09 | Discharge: 2017-05-09 | Disposition: A | Discharge status: Home / Self Care | Payer: Medicare Other | Source: Ambulatory Visit | Attending: Adult Health | Admitting: Adult Health

## 2017-05-09 ENCOUNTER — Encounter: Payer: Self-pay | Admitting: Adult Health

## 2017-05-09 ENCOUNTER — Other Ambulatory Visit (INDEPENDENT_AMBULATORY_CARE_PROVIDER_SITE_OTHER): Payer: Medicare Other

## 2017-05-09 ENCOUNTER — Telehealth: Payer: Self-pay | Admitting: Adult Health

## 2017-05-09 VITALS — BP 130/62 | HR 100 | Ht 75.0 in | Wt 185.0 lb

## 2017-05-09 DIAGNOSIS — J449 Chronic obstructive pulmonary disease, unspecified: Secondary | ICD-10-CM

## 2017-05-09 DIAGNOSIS — I5032 Chronic diastolic (congestive) heart failure: Secondary | ICD-10-CM | POA: Diagnosis not present

## 2017-05-09 DIAGNOSIS — Z85828 Personal history of other malignant neoplasm of skin: Secondary | ICD-10-CM | POA: Diagnosis not present

## 2017-05-09 DIAGNOSIS — D485 Neoplasm of uncertain behavior of skin: Secondary | ICD-10-CM | POA: Diagnosis not present

## 2017-05-09 DIAGNOSIS — D0462 Carcinoma in situ of skin of left upper limb, including shoulder: Secondary | ICD-10-CM | POA: Diagnosis not present

## 2017-05-09 DIAGNOSIS — J9611 Chronic respiratory failure with hypoxia: Secondary | ICD-10-CM | POA: Diagnosis not present

## 2017-05-09 DIAGNOSIS — R0602 Shortness of breath: Secondary | ICD-10-CM | POA: Diagnosis not present

## 2017-05-09 DIAGNOSIS — G4733 Obstructive sleep apnea (adult) (pediatric): Secondary | ICD-10-CM

## 2017-05-09 LAB — CBC WITH DIFFERENTIAL/PLATELET
BASOS PCT: 1 % (ref 0.0–3.0)
Basophils Absolute: 0.1 10*3/uL (ref 0.0–0.1)
EOS ABS: 0.1 10*3/uL (ref 0.0–0.7)
Eosinophils Relative: 1.5 % (ref 0.0–5.0)
HCT: 39.3 % (ref 39.0–52.0)
HEMOGLOBIN: 12.3 g/dL — AB (ref 13.0–17.0)
Lymphocytes Relative: 6.6 % — ABNORMAL LOW (ref 12.0–46.0)
Lymphs Abs: 0.6 10*3/uL — ABNORMAL LOW (ref 0.7–4.0)
MCHC: 31.2 g/dL (ref 30.0–36.0)
MCV: 79.4 fl (ref 78.0–100.0)
MONO ABS: 1.2 10*3/uL — AB (ref 0.1–1.0)
Monocytes Relative: 12.2 % — ABNORMAL HIGH (ref 3.0–12.0)
Neutro Abs: 7.7 10*3/uL (ref 1.4–7.7)
Neutrophils Relative %: 78.7 % — ABNORMAL HIGH (ref 43.0–77.0)
Platelets: 471 10*3/uL — ABNORMAL HIGH (ref 150.0–400.0)
RBC: 4.95 Mil/uL (ref 4.22–5.81)
RDW: 20.4 % — AB (ref 11.5–15.5)
WBC: 9.8 10*3/uL (ref 4.0–10.5)

## 2017-05-09 LAB — BASIC METABOLIC PANEL
BUN: 24 mg/dL — AB (ref 6–23)
CALCIUM: 9.8 mg/dL (ref 8.4–10.5)
CHLORIDE: 96 meq/L (ref 96–112)
CO2: 29 mEq/L (ref 19–32)
CREATININE: 1.22 mg/dL (ref 0.40–1.50)
GFR: 59.37 mL/min — ABNORMAL LOW (ref >60.00)
Glucose, Bld: 107 mg/dL — ABNORMAL HIGH (ref 70–99)
Potassium: 4.6 mEq/L (ref 3.5–5.1)
Sodium: 135 mEq/L (ref 135–145)

## 2017-05-09 LAB — BRAIN NATRIURETIC PEPTIDE: Pro B Natriuretic peptide (BNP): 59 pg/mL (ref 0.0–100.0)

## 2017-05-09 NOTE — Assessment & Plan Note (Signed)
O2 desats return with ambulation , begin O2 with  act at 2l/m . And O2 At bedtime  .  POC order  Check labs and chest xray   Plan  Patient Instructions  Begin Oxygen 2l/m with activity .  POC ordered  Increase Oxygen 2l/m At bedtime  .  Labs and chest xray today .  Follow up with Dr. Chase Caller as planned and As needed   Please contact office for sooner follow up if symptoms do not improve or worsen or seek emergency care

## 2017-05-09 NOTE — Patient Instructions (Signed)
Begin Oxygen 2l/m with activity .  POC ordered  Increase Oxygen 2l/m At bedtime  .  Labs and chest xray today .  Follow up with Dr. Chase Caller as planned and As needed   Please contact office for sooner follow up if symptoms do not improve or worsen or seek emergency care

## 2017-05-09 NOTE — Progress Notes (Signed)
@Patient  ID: Jose Good Sr., male    DOB: 1925-05-28, 84 y.o.   MRN: [id number]  No chief complaint on file.   Referring provider: [name], MD  HPI: 84 year old male former smoker followed for gold 2 COPD, pulmonary hypertension and chronic hypoxic respiratory failure on nocturnal oxygen.   TEST  Data Reviewed: PFTs 07/20/12 FVC 2.66 [52%], FEV1 1.85 (53%], F/F 69, TLC 72%, RV/TLC 134%, DLCO 28%. Moderate-severe obstruction, mild restriction with airtrapping, severe diffusion impairment.  PFTs 10/06/16 FVC 2.49 (53%], FEV1 1.91 (57%], F/F 76, TLC 73%, RV/TLC 133%, DLCO 36% Moderate obstructive airway disease, mild restriction with severe diffusion defect  CT abdomen 05/30/13-mild reticular opacities/scarring at the lung bases right greater than left Chest x-ray 05/02/15-hyperinflation, mild lung base scarring Chest x-ray 06/16/14-hyperinflation, no active cardiopulmonary disease. CT scan 10/07/16- no evidence of interstitial lung disease, 4 mm subpleural pulmonary nodule, right upper lobe 1.1 cm groundglass pulmonary nodule. Coronary atherosclerosis, dilated pulmonary artery I reviewed all images personally.  Echocardiogram 10/27/16 LVEF 96-22%, grade 1 diastolic dysfunction severe elevation and PA systolic pressure. PA peak pressure of 61 RV cavity is mildly dilated, systolic function is normal.  HST >Severe OSA    2D echo October 2018 EF 60 to 29%, grade 1 diastolic dysfunction, pulmonary artery pressure 61 mmHg.  Sleep study April 08, 2017 showed mild sleep apnea AHI 10/hour with O2 desaturations.   05/09/2017 Follow up : COPD , Pulmonary HTN and O2 RF  Patient presents for a follow-up.  Patient has underlying moderate COPD On Stiolto.   He was admitted in December for COPD exacerbation and UTI with urosepsis.  He also had decompensated diastolic heart failure that required diuresis.  He improved after this admission.  Had been discharged on oxygen with  activity.  And continued on nocturnal oxygen.  He did not use the exertional oxygen and did not show any desaturations with ambulation.  Portable oxygen was sent back to DME. Was noted to have severe pulmonary hypertension on echo 2018.  Previous home sleep study showed severe sleep apnea.  He was sent for a reevaluation with sleep consult February 2019.  He was set up for home sleep study that showed mild sleep apnea with AHI of 10..  We discussed his sleep study results.  He does not wish to proceed with CPAP.  He wishes to continue with oxygen.. Patient says over the last week or 2 has noticed that breathing has not been doing as well.  Gets more short of breath with walking.  Walk test in the office today shows patient does desaturate with walking down to 86% on room air.  Patient was placed on oxygen 2 L with O2 saturations maintaining above 90%..  Patient says he is currently on antibiotics for a UTI.  He denies any increased cough congestion and no fever.  He denies any chest pain, orthopnea, increased edema.  No Known Allergies  Immunization History  Administered Date(s) Administered  . Influenza Whole 10/12/2011  . Influenza, High Dose Seasonal PF 10/04/2016  . Pneumococcal Polysaccharide-23 01/20/2014    Past Medical History:  Diagnosis Date  . Arthritis    BACK  . Bifascicular block   . Bladder cancer ([place])   . BPH (benign prostatic hyperplasia)   . Chronic constipation   . Chronic restrictive lung disease   . COPD mixed type ([place])   . Coronary artery disease CARDIOLOGIST- DR Thompson Grayer   STRESS TEST 08-17-10 IN EPIC (LOW RISK ,  NO  ISCHEMIA)  . GERD (gastroesophageal reflux disease)   . History of basal cell carcinoma excision BILATERAL ARMS  . History of colon polyps 2004   TA  . Hx of thyroid cancer    s/p total thyroidectomy,  . Hyperlipidemia   . Hypertension   . Hypothyroidism   . Hypothyroidism, postsurgical   . Iron deficiency anemia   . Nocturia   .  Right bundle branch block   . Sigmoid diverticulosis   . Wears glasses   . Wears glasses     Tobacco History: Social History   Tobacco Use  Smoking Status Former Smoker  . Packs/day: 2.00  . Years: 35.00  . Pack years: 70.00  . Types: Cigarettes  . Last attempt to quit: 01/11/1982  . Years since quitting: 35.3  Smokeless Tobacco Never Used   Counseling given: Not Answered   Outpatient Encounter Medications as of 05/09/2017  Medication Sig  . atorvastatin (LIPITOR) 20 MG tablet Take 20 mg by mouth daily after breakfast.   . ciprofloxacin (CIPRO) 250 MG tablet Take 250 mg by mouth 2 (two) times daily.  . iron polysaccharides (NIFEREX) 150 MG capsule Take 150 mg by mouth daily.  [name] levothyroxine (SYNTHROID, LEVOTHROID) 175 MCG tablet Take 175 mcg by mouth daily after breakfast.   . nitroGLYCERIN (NITRODUR - DOSED IN MG/24 HR) 0.1 mg/hr patch Place 0.1 mg onto the skin daily.  . nitroGLYCERIN (NITROSTAT) 0.4 MG SL tablet Place 1 tablet (0.4 mg total) under the tongue every 5 (five) minutes as needed for chest pain.  . pantoprazole (PROTONIX) 40 MG tablet Take 1 tablet (40 mg total) by mouth daily. (Patient taking differently: Take 40 mg by mouth daily after breakfast. )  . polyethylene glycol powder (GLYCOLAX/MIRALAX) powder Take 17 g by mouth daily as needed (for constipation).  [name] testosterone enanthate (DELATESTRYL) 200 MG/ML injection Inject 200 mg into the muscle every 28 (twenty-eight) days. For IM use only  . Tiotropium Bromide-Olodaterol (STIOLTO RESPIMAT) 2.5-2.5 MCG/ACT AERS Inhale 2 puffs into the lungs daily.  [name] triamterene-hydrochlorothiazide (MAXZIDE-25) 37.5-25 MG tablet Take 1 tablet by mouth daily.  . vitamin B-12 (CYANOCOBALAMIN) 1000 MCG tablet Take 1,000 mcg by mouth 3 (three) times a week.  . Vitamin D, Ergocalciferol, (DRISDOL) 50000 units CAPS capsule Take 50,000 Units by mouth every Monday.   . [DISCONTINUED] aspirin EC 81 MG tablet Take 81 mg by mouth daily.  .  [DISCONTINUED] doxycycline (ADOXA) 100 MG tablet Take 100 mg by mouth 2 (two) times daily.   No facility-administered encounter medications on file as of 05/09/2017.      Review of Systems  Constitutional:   No  weight loss, night sweats,  Fevers, chills,   +fatigue, or  lassitude.  HEENT:   No headaches,  Difficulty swallowing,  Tooth/dental problems, or  Sore throat,                No sneezing, itching, ear ache, nasal congestion, post nasal drip,   CV:  No chest pain,  Orthopnea, PND, swelling in lower extremities, anasarca, dizziness, palpitations, syncope.   GI  No heartburn, indigestion, abdominal pain, nausea, vomiting, diarrhea, change in bowel habits, loss of appetite, bloody stools.   Resp:   No excess mucus, no productive cough,  No non-productive cough,  No coughing up of blood.  No change in color of mucus.  No wheezing.  No chest wall deformity  Skin: no rash or lesions.  GU: no dysuria, change in color of urine, no  urgency or frequency.  No flank pain, no hematuria   MS:  No joint pain or swelling.  No decreased range of motion.  No back pain.    Physical Exam  BP 130/62 (BP Location: Left Arm, Cuff Size: Normal)   Pulse 100   Ht 6\' 3"  (1.905 m)   Wt 185 lb (83.9 kg)   SpO2 93%   BMI 23.12 kg/m   GEN: A/Ox3; pleasant , NAD, elderly   HEENT:  [place]/AT,  EACs-clear, TMs-wnl, NOSE-clear, THROAT-clear, no lesions, no postnasal drip or exudate noted.   NECK:  Supple w/ fair ROM; no JVD; normal carotid impulses w/o bruits; no thyromegaly or nodules palpated; no lymphadenopathy.    RESP  Clear  P & A; w/o, wheezes/ rales/ or rhonchi. no accessory muscle use, no dullness to percussion  CARD:  RRR, no m/r/g, trace  peripheral edema, pulses intact, no cyanosis or clubbing.  GI:   Soft & nt; nml bowel sounds; no organomegaly or masses detected.   Musco: Warm bil, no deformities or joint swelling noted.   Neuro: alert, no focal deficits noted.    Skin: Warm, no  lesions or rashes    Lab Results:  CBC  BMET  Imaging: Dg Chest 2 View  Result Date: 05/09/2017 CLINICAL DATA:  Shortness of breath with exertion. COPD, hypertension. EXAM: CHEST - 2 VIEW COMPARISON:  01/31/2017 FINDINGS: Hyperinflation of the lungs compatible with COPD. Stable. Stable areas of scarring in the lungs. No acute confluent airspace opacities or effusions. Heart is normal size. Aortic calcifications/atherosclerosis, most notable in the aortic arch. No acute bony abnormality. IMPRESSION: COPD/chronic changes.  No active disease. Electronically Signed   By: Rolm Baptise M.D.   On: 05/09/2017 11:58     Assessment & Plan:   COPD GOLD II Appears stable w/ no increased cough however more dyspnea and exertional desats have returned suspect is multi factoral with D CHF , Pulmonary HTN and COPD  Check cxr  Cont on Stiolto   Plan  Patient Instructions  Begin Oxygen 2l/m with activity .  POC ordered  Increase Oxygen 2l/m At bedtime  .  Labs and chest xray today .  Follow up with Dr. Chase Caller as planned and As needed   Please contact office for sooner follow up if symptoms do not improve or worsen or seek emergency care       Diastolic congestive heart failure ([place]) Appears compensated with no clinical evidence of volume overload and wt is stable  Check BNP , bmet  Cont on HCTZ    Chronic respiratory failure with hypoxia ([place]) O2 desats return with ambulation , begin O2 with  act at 2l/m . And O2 At bedtime  .  POC order  Check labs and chest xray   Plan  Patient Instructions  Begin Oxygen 2l/m with activity .  POC ordered  Increase Oxygen 2l/m At bedtime  .  Labs and chest xray today .  Follow up with Dr. Chase Caller as planned and As needed   Please contact office for sooner follow up if symptoms do not improve or worsen or seek emergency care        OSA (obstructive sleep apnea) Mild OSA on HST - discussed test results  Needs to cont on o2 At bedtime  .  Declines CPAP .      Rexene Edison, NP 05/09/2017

## 2017-05-09 NOTE — Assessment & Plan Note (Signed)
Mild OSA on HST - discussed test results  Needs to cont on o2 At bedtime  . Declines CPAP .

## 2017-05-09 NOTE — Addendum Note (Signed)
Addended by: Parke Poisson E on: 05/09/2017 03:02 PM   Modules accepted: Orders

## 2017-05-09 NOTE — Assessment & Plan Note (Signed)
Appears compensated with no clinical evidence of volume overload and wt is stable  Check BNP , bmet  Cont on HCTZ

## 2017-05-09 NOTE — Assessment & Plan Note (Signed)
Appears stable w/ no increased cough however more dyspnea and exertional desats have returned suspect is multi factoral with D CHF , Pulmonary HTN and COPD  Check cxr  Cont on Stiolto   Plan  Patient Instructions  Begin Oxygen 2l/m with activity .  POC ordered  Increase Oxygen 2l/m At bedtime  .  Labs and chest xray today .  Follow up with Dr. Chase Caller as planned and As needed   Please contact office for sooner follow up if symptoms do not improve or worsen or seek emergency care

## 2017-05-10 NOTE — Telephone Encounter (Signed)
Spoke to Severn.  He wanted me to be aware for tracking purposes that order that was placed for pt was not for a simply go mini.  MR had put in order in March for O2 to be discontinued & simply go mini was one of the items that was picked up.  Nothing further needed.

## 2017-05-11 ENCOUNTER — Telehealth: Payer: Self-pay | Admitting: Adult Health

## 2017-05-11 DIAGNOSIS — R0602 Shortness of breath: Secondary | ICD-10-CM | POA: Diagnosis not present

## 2017-05-11 DIAGNOSIS — C672 Malignant neoplasm of lateral wall of bladder: Secondary | ICD-10-CM | POA: Diagnosis not present

## 2017-05-11 DIAGNOSIS — N179 Acute kidney failure, unspecified: Secondary | ICD-10-CM | POA: Diagnosis not present

## 2017-05-11 DIAGNOSIS — N50812 Left testicular pain: Secondary | ICD-10-CM | POA: Diagnosis not present

## 2017-05-11 DIAGNOSIS — J449 Chronic obstructive pulmonary disease, unspecified: Secondary | ICD-10-CM | POA: Diagnosis not present

## 2017-05-11 NOTE — Telephone Encounter (Signed)
Spoke with [name]- states that pt had an order placed to be evaluated for a POC, but we do POC titrating walks in office, so pt will need to be seen and requalified on a poc before one can be dispensed to the pt.    Pt has an ov on 5/6 with MR- will make a note that pt needs poc qualifying walk at that visit for [facility] to provide appropriate equipment.  Will also need an order requesting a Simply Go Mini at pt request.     Forwarding to Raquel Sarna to make aware.

## 2017-05-16 ENCOUNTER — Encounter: Payer: Self-pay | Admitting: Internal Medicine

## 2017-05-16 ENCOUNTER — Ambulatory Visit: Payer: Medicare Other | Admitting: Internal Medicine

## 2017-05-16 VITALS — BP 120/58 | HR 89 | Ht 75.0 in | Wt 188.2 lb

## 2017-05-16 DIAGNOSIS — R0602 Shortness of breath: Secondary | ICD-10-CM | POA: Diagnosis not present

## 2017-05-16 DIAGNOSIS — N179 Acute kidney failure, unspecified: Secondary | ICD-10-CM | POA: Diagnosis not present

## 2017-05-16 DIAGNOSIS — R0902 Hypoxemia: Secondary | ICD-10-CM

## 2017-05-16 DIAGNOSIS — R0989 Other specified symptoms and signs involving the circulatory and respiratory systems: Secondary | ICD-10-CM | POA: Diagnosis not present

## 2017-05-16 DIAGNOSIS — J449 Chronic obstructive pulmonary disease, unspecified: Secondary | ICD-10-CM | POA: Diagnosis not present

## 2017-05-16 NOTE — Patient Instructions (Signed)
ICD-10-CM   1. Shortness of breath R06.02   2. COPD GOLD II J44.9 Ambulatory Referral for DME  3. Exercise hypoxemia R09.02   4. Bibasilar crackles R09.89     Unclear why you are having worsening shortness of breath when clinically your emphysema/copd and heart issues appear stable  You do have lung crackles and wonder if theree might be ongoing fibrosis/lung inflammation v fluid in lung (in Jan 2019 the radiologist thought this was just lungs not getting good deep breath).  PLAN - definitely start portable 02  - do HRCT supine and prone - wil call with results - if it looks like a fluid issue will call in lasix. If it looks like lung inflammation, will be blood work and repeat office visi

## 2017-05-16 NOTE — Telephone Encounter (Signed)
Pt seen today by MR for a visit and was walked with Simply go [employer].   Order placed for the qualifying walk via simply go mini.

## 2017-05-16 NOTE — Progress Notes (Signed)
Subjective:     Patient ID: Jose Good Sr., male   DOB: November 14, 1925, 84 y.o.   MRN: [id number]  HPI  84 yo former smoker followed for COPD GOLD II and Pulmoinary HTN  Thyroid and Bladder Cancer    TEST   Data Reviewed: PFTs 07/20/12 FVC 2.66 [52%], FEV1 1.85 (53%], F/F 69, TLC 72%, RV/TLC 134%, DLCO 28%. Moderate-severe obstruction, mild restriction with airtrapping, severe diffusion impairment.  PFTs 10/06/16 FVC 2.49 (53%], FEV1 1.91 (57%], F/F 76, TLC 73%, RV/TLC 133%, DLCO 36% Moderate obstructive airway disease, mild restriction with severe diffusion defect  CT abdomen 05/30/13-mild reticular opacities/scarring at the lung bases right greater than left Chest x-ray 05/02/15-hyperinflation, mild lung base scarring Chest x-ray 06/16/14-hyperinflation, no active cardiopulmonary disease. CT scan 10/07/16- no evidence of interstitial lung disease, 4 mm subpleural pulmonary nodule, right upper lobe 1.1 cm groundglass pulmonary nodule. Coronary atherosclerosis, dilated pulmonary artery I reviewed all images personally.  Echocardiogram 10/27/16 LVEF 97-98%, grade 1 diastolic dysfunction severe elevation and PA systolic pressure.  PA peak pressure of 61 RV cavity is mildly dilated, systolic function is normal.   PFT Moderate Obstruction  HST >Severe OSA  CPAP titration study >  01/20/17 Post Hospital follow up : COPD , O2 RF  Pt presents for a post hospital follow up .  Patient was treated for a COPD exacerbation and UTI with urosepsis  last week.  He was treated with antibiotics.  He did have some diastolic dysfunction.  Requiring diuresis.  Patient says since discharge he is feeling better.  Shortness of breath has decreased cough and congestion have also decreased.  Patient had a recent home sleep study that showed severe sleep apnea.  He has been set up for a CPAP titration study.  That is scheduled for 2 weeks.  Patient is on oxygen at bedtime. He was also discharged on  oxygen 2 L with activity.  O2 saturations walking today on room air 88%.  O2 at 1 L O2 saturations 94%.  Patient needs a portable oxygen concentrator as it is too hard for him to carry his tanks.   OV 02/25/2017 Chief Complaint  Patient presents with  . Follow-up    F/U after seeing TP 01/20/17.  Pt's wife states breathing has become better.  Pt does become SOB when going upstairs or going uphill. Denies any cough or CP.   84 year old man who is quite functional has a diagnosis of COPD based on pulmonary function test.  Interstitial lung disease was suspected but ruled out on high-resolution CT chest in September 2018.  COPD is based on pulmonary function test that is mixed obstruction and restriction.  He does have significant pulmonary hypertension based on CT and echo findings.  In January 2019 he was admitted for an exacerbation that sounded like a viral exacerbation but respiratory virus panel was negative.  He also had some deep compensated cor pulmonale and was diuresed.  Right now he is here for follow-up.  He is beginning to feel better.  In fact is much better.  His only main issue is chronic arthritis and carpal tunnel syndrome.  He does have some fatigue but overall he is improved.  Walking desaturation test shows that he does not desaturate.  He plans to return his portable oxygen away.  He still using his nighttime oxygen.  In the interim he did have a sleep apnea study and it shows he has moderate obstructive sleep apnea without central sleep apnea.  Dr. Eartha Inch  reviewed this and is recommended 2 L oxygen at night and also CPAP therapy with a moderate fitting mask.  However he and his wife wanted to see Dr. Gwenlyn Perking first before starting all this.  They want to have a discussion.  He continues with his inhaler of long-acting beta agonist and anticholinergic.  Walking desaturation test on 02/25/2017 185 feet x 3 laps on ROOM AIR:  did not desaturate. Rest pulse ox was 99%, final pulse ox was 92%. HR  response 80/min at rest to 104/min at peak exertion. Patient Vale S Jose Sr.  Did not Desaturate < 88% . Jose S Devine Sr. did yes  Desaturated </= 3% points. In fact dropped 7 points Jose Park Sr. yes get tachyardic    05/09/2017 Follow up : COPD , Pulmonary HTN and O2 RF  Patient presents for a follow-up.  Patient has underlying moderate COPD On Stiolto.   He was admitted in December for COPD exacerbation and UTI with urosepsis.  He also had decompensated diastolic heart failure that required diuresis.  He improved after this admission.  Had been discharged on oxygen with activity.  And continued on nocturnal oxygen.  He did not use the exertional oxygen and did not show any desaturations with ambulation.  Portable oxygen was sent back to DME. Was noted to have severe pulmonary hypertension on echo 2018.  Previous home sleep study showed severe sleep apnea.  He was sent for a reevaluation with sleep consult February 2019.  He was set up for home sleep study that showed mild sleep apnea with AHI of 10..  We discussed his sleep study results.  He does not wish to proceed with CPAP.  He wishes to continue with oxygen.. Patient says over the last week or 2 has noticed that breathing has not been doing as well.  Gets more short of breath with walking.  Walk test in the office today shows patient does desaturate with walking down to 86% on room air.  Patient was placed on oxygen 2 L with O2 saturations maintaining above 90%..  Patient says he is currently on antibiotics for a UTI.  He denies any increased cough congestion and no fever.  He denies any chest pain, orthopnea, increased edema.    OV 05/16/2017  Chief Complaint  Patient presents with  . Follow-up    qualify for POC-O2 at night, winded with little exertion, SOB with increased exertion   Follow-up COPD/emphysema based on CT scan of the chest January 2019 on Stiolto Follow-up elevated pulmonary artery pressures on  echocardiogram early 2019 Follow-up sleep apnea on nocturnal oxygen.  Declines CPAP  Last seen in February 2019 as part of hospital follow-up from January 2019 when he thought he had a COPD related exacerbation associated with UTI and urosepsis.  And follow-up in February had improved significantly.  Although his pulse ox decline with walking it was still adequate and he returned this exertional oxygen.  He did undergo a sleep clinic follow-up with Dr. Baird Lyons was advised CPAP but given the age and potential lack of benefits he declined.  He continues on nocturnal oxygen.  But he tells me few weeks after the visit with me in February he started noticing worsening dyspnea on exertion while walking to the mailbox and back.  He saw my nurse practitioner May 09, 2017 and it did confirm that he was not desaturating below 88% and as low as 86% and correcting with oxygen.  However the oxygen company wants  him to requalify for portable oxygen system and therefore he is here today.  In addition he is frustrated by the worsening decline in dyspnea since the onset few to several weeks ago.  There is no associated chest pain orthopnea or edema proximal nocturnal dyspnea.  He feels euvolemic.  Noted that he is not on Lasix.  There is no wheezing or cough.  He has not lost any weight.  Of note, in January 2019-minute seen him I was concerned about interstitial lung disease but given the hospitalization and confusing findings on the CT scan with pleural effusion and radiologist calling it atelectasis although this was not a high-resolution CT chest it was felt that may be his primary problem was just emphysema.    Results for BRYKER, FLETCHALL (MRN [id number]) as of 02/25/2017 10:36  Ref. Range 10/06/2016 10:47  FEV1-Post Latest Units: L 1.91  FEV1-%Pred-Post Latest Units: % 57  FEV1-%Change-Post Latest Units: % 4  Results for KOTY, ANCTIL SR. (MRN [id number]) as of 02/25/2017 10:36  Ref. Range 10/06/2016  10:47  DLCO cor Latest Units: ml/min/mmHg 14.66  DLCO cor % pred Latest Units: % 36     Simple office walk 185 feet x  3 laps goal with finger  probe 05/16/2017   O2 used Room air  Number laps completed Only 1 and desat top 86%  Comments about pace   Resting Pulse Ox/HR 95% and 89/min  Final Pulse Ox/HR 88% and 99/min  Desaturated </= 88% yes  Desaturated <= 3% points yes  Got Tachycardic >/= 90/min yes  Symptoms at end of test dyspnea  Miscellaneous comments Did  2 more laps at 2L [place] and pulse ox statyed at 90%       has a past medical history of Arthritis, Bifascicular block, Bladder cancer ([place]), BPH (benign prostatic hyperplasia), Chronic constipation, Chronic restrictive lung disease, COPD mixed type ([place]), Coronary artery disease (CARDIOLOGIST- DR Thompson Grayer), GERD (gastroesophageal reflux disease), History of basal cell carcinoma excision (BILATERAL ARMS), History of colon polyps (2004), thyroid cancer, Hyperlipidemia, Hypertension, Hypothyroidism, Hypothyroidism, postsurgical, Iron deficiency anemia, Nocturia, Right bundle branch block, Sigmoid diverticulosis, Wears glasses, and Wears glasses.   reports that he quit smoking about 35 years ago. His smoking use included cigarettes. He has a 70.00 pack-year smoking history. He has never used smokeless tobacco.  Past Surgical History:  Procedure Laterality Date  . CARDIOVASCULAR STRESS TEST  08-17-2010   dr allred   Low risk nuclear study with prominent inferobasal thinning vs small prior infarct, no ischemia/  normal LV function and wall motion , ef 63%  . CATARACT EXTRACTION W/ INTRAOCULAR LENS  IMPLANT, BILATERAL    . CYSTOSCOPY W/ RETROGRADES Bilateral 10/08/2014   Procedure: CYSTOSCOPY WITH BILATERAL RETROGRADE PYELOGRAM, BIOPSY, FULGURATION;  Surgeon: Festus Aloe, MD;  Location: [facility];  Service: Urology;  Laterality: Bilateral;  . CYSTOSCOPY W/ RETROGRADES Bilateral 06/18/2016   Procedure: CYSTOSCOPY  WITH RETROGRADE PYELOGRAM;  Surgeon: Festus Aloe, MD;  Location: [facility];  Service: Urology;  Laterality: Bilateral;  . CYSTOSCOPY WITH BIOPSY  12/16/2010   Procedure: CYSTOSCOPY WITH BIOPSY;  Surgeon: Molli Hazard, MD;  Location[facility];  Service: Urology;  Laterality: N/A;  . CYSTOSCOPY WITH BIOPSY N/A 05/20/2015   Procedure: CYSTOSCOPY WITH BLADDER BIOPSY AND FULGERATION, CYSTOGRAM, BILATERAL RETROGRADES, BILATERAL RENAL WASHINGS;  Surgeon: Festus Aloe, MD;  Location: [facility];  Service: Urology;  Laterality: N/A;  . CYSTOSCOPY WITH BIOPSY N/A 06/18/2016  Procedure: CYSTOSCOPY WITH BIOPSY FULGURATION;  Surgeon: Festus Aloe, MD;  Location: [facility];  Service: Urology;  Laterality: N/A;  . CYSTOSCOPY/RETROGRADE/URETEROSCOPY  12/16/2010   Procedure: CYSTOSCOPY/RETROGRADE/URETEROSCOPY;  Surgeon: Molli Hazard, MD;  Location[facility];  Service: Urology;  Laterality: Right;  bilateral retrograde, right ureteroscopy  . EYE SURGERY     attached lens to iris  . GREEN LIGHT LASER TURP (TRANSURETHRAL RESECTION OF PROSTATE  11-02-2011  . KNEE ARTHROSCOPY Left 11/  2016  . TOTAL THYROIDECTOMY  1991  . TRANSURETHRAL RESECTION OF BLADDER TUMOR  12/16/2010   Procedure: TRANSURETHRAL RESECTION OF BLADDER TUMOR (TURBT);  Surgeon: Molli Hazard, MD;  Location: [facility];  Service: Urology;  Laterality: N/A;    No Known Allergies  Immunization History  Administered Date(s) Administered  . Influenza Whole 10/12/2011  . Influenza, High Dose Seasonal PF 10/04/2016  . Pneumococcal Polysaccharide-23 01/20/2014    Family History  Problem Relation Age of Onset  . Lung cancer Brother   . Thyroid cancer Unknown   . Diabetes Father      Current Outpatient Medications:  .  atorvastatin (LIPITOR) 20 MG tablet, Take 20 mg by mouth daily after breakfast. , Disp: , Rfl:  .  ciprofloxacin (CIPRO) 250 MG  tablet, Take 250 mg by mouth 2 (two) times daily., Disp: , Rfl:  .  iron polysaccharides (NIFEREX) 150 MG capsule, Take 150 mg by mouth daily., Disp: , Rfl:  .  levothyroxine (SYNTHROID, LEVOTHROID) 175 MCG tablet, Take 175 mcg by mouth daily after breakfast. , Disp: , Rfl:  .  nitroGLYCERIN (NITRODUR - DOSED IN MG/24 HR) 0.1 mg/hr patch, Place 0.1 mg onto the skin daily., Disp: , Rfl:  .  nitroGLYCERIN (NITROSTAT) 0.4 MG SL tablet, Place 1 tablet (0.4 mg total) under the tongue every 5 (five) minutes as needed for chest pain., Disp: 30 tablet, Rfl: 12 .  pantoprazole (PROTONIX) 40 MG tablet, Take 1 tablet (40 mg total) by mouth daily. (Patient taking differently: Take 40 mg by mouth daily after breakfast. ), Disp: 60 tablet, Rfl: 0 .  polyethylene glycol powder (GLYCOLAX/MIRALAX) powder, Take 17 g by mouth daily as needed (for constipation)., Disp: , Rfl:  .  testosterone enanthate (DELATESTRYL) 200 MG/ML injection, Inject 200 mg into the muscle every 28 (twenty-eight) days. For IM use only, Disp: , Rfl:  .  Tiotropium Bromide-Olodaterol (STIOLTO RESPIMAT) 2.5-2.5 MCG/ACT AERS, Inhale 2 puffs into the lungs daily., Disp: 1 Inhaler, Rfl: 5 .  triamterene-hydrochlorothiazide (MAXZIDE-25) 37.5-25 MG tablet, Take 1 tablet by mouth daily., Disp: , Rfl: 0 .  vitamin B-12 (CYANOCOBALAMIN) 1000 MCG tablet, Take 1,000 mcg by mouth 3 (three) times a week., Disp: , Rfl:  .  Vitamin D, Ergocalciferol, (DRISDOL) 50000 units CAPS capsule, Take 50,000 Units by mouth every Monday. , Disp: , Rfl:   Review of Systems     Objective:   Physical Exam  Constitutional: He is oriented to person, place, and time. He appears well-developed. No distress.  Looks euvolemic  HENT:  Head: Normocephalic and atraumatic.  Right Ear: External ear normal.  Left Ear: External ear normal.  Mouth/Throat: Oropharynx is clear and moist. No oropharyngeal exudate.  Eyes: Pupils are equal, round, and reactive to light. Conjunctivae  and EOM are normal. Right eye exhibits no discharge. Left eye exhibits no discharge. No scleral icterus.  Neck: Normal range of motion. Neck supple. No JVD present. No tracheal deviation present. No thyromegaly present.  Cardiovascular: Normal rate,  regular rhythm and intact distal pulses. Exam reveals no gallop and no friction rub.  No murmur heard. Pulmonary/Chest: Effort normal. No respiratory distress. He has no wheezes. He has rales. He exhibits no tenderness.  Bottom 1/3 velcro crackles  Abdominal: Soft. Bowel sounds are normal. He exhibits no distension and no mass. There is no tenderness. There is no rebound and no guarding.  Musculoskeletal: Normal range of motion. He exhibits no edema or tenderness.  Lymphadenopathy:    He has no cervical adenopathy.  Neurological: He is alert and oriented to person, place, and time. He has normal reflexes. No cranial nerve deficit. Coordination normal.  Skin: Skin is warm and dry. No rash noted. He is not diaphoretic. No erythema. No pallor.  Psychiatric: He has a normal mood and affect. His behavior is normal. Judgment and thought content normal.  Nursing note and vitals reviewed.  Vitals:   05/16/17 0943  BP: (!) 120/58  Pulse: 89  SpO2: 95%  Weight: 188 lb 3.2 oz (85.4 kg)  Height: 6\' 3"  (1.905 m)    Estimated body mass index is 23.52 kg/m as calculated from the following:   Height as of this encounter: 6\' 3"  (1.905 m).   Weight as of this encounter: 188 lb 3.2 oz (85.4 kg).     Assessment:       ICD-10-CM   1. Shortness of breath R06.02   2. COPD GOLD II J44.9 Ambulatory Referral for DME  3. Exercise hypoxemia R09.02   4. Bibasilar crackles R09.89        Plan:      Unclear why you are having worsening shortness of breath when clinically your emphysema/copd and heart issues appear stable  You do have lung crackles and wonder if theree might be ongoing fibrosis/lung inflammation v fluid in lung (in Jan 2019 the radiologist  thought this was just lungs not getting good deep breath).  PLAN - definitely start portable 02  - do HRCT supine and prone - wil call with results - if it looks like a fluid issue will call in lasix. If it looks like lung inflammation, will be blood work and repeat office visi    (> 50% of this 15 min visit spent in face to face counseling or/and coordination of care)   Dr. Brand Males, M.D., [facility].C.P Pulmonary and Critical Care Medicine Staff Physician, [place] Director - Interstitial Lung Disease  Program  Pulmonary [place] at [place], [place], [address]  Pager: [phone], If no answer or between  15:00h - 7:00h: call [phone] Telephone: [phone]

## 2017-05-17 ENCOUNTER — Other Ambulatory Visit: Payer: Self-pay | Admitting: Urology

## 2017-05-17 ENCOUNTER — Encounter (HOSPITAL_COMMUNITY): Payer: Self-pay

## 2017-05-17 ENCOUNTER — Encounter (HOSPITAL_COMMUNITY): Payer: Self-pay | Admitting: *Deleted

## 2017-05-18 ENCOUNTER — Encounter (HOSPITAL_COMMUNITY): Payer: Self-pay

## 2017-05-18 ENCOUNTER — Encounter (HOSPITAL_COMMUNITY)
Admission: RE | Admit: 2017-05-18 | Discharge: 2017-05-18 | Disposition: A | Discharge status: Home / Self Care | Payer: Medicare Other | Source: Ambulatory Visit | Attending: Urology | Admitting: Urology

## 2017-05-18 ENCOUNTER — Other Ambulatory Visit: Payer: Self-pay

## 2017-05-18 DIAGNOSIS — N50812 Left testicular pain: Secondary | ICD-10-CM | POA: Insufficient documentation

## 2017-05-18 DIAGNOSIS — Z01818 Encounter for other preprocedural examination: Secondary | ICD-10-CM | POA: Insufficient documentation

## 2017-05-18 DIAGNOSIS — C679 Malignant neoplasm of bladder, unspecified: Secondary | ICD-10-CM | POA: Diagnosis not present

## 2017-05-18 LAB — BASIC METABOLIC PANEL
ANION GAP: 8 (ref 5–15)
BUN: 24 mg/dL — ABNORMAL HIGH (ref 6–20)
CALCIUM: 9.5 mg/dL (ref 8.9–10.3)
CO2: 30 mmol/L (ref 22–32)
Chloride: 102 mmol/L (ref 101–111)
Creatinine, Ser: 1.21 mg/dL (ref 0.61–1.24)
GFR, EST AFRICAN AMERICAN: 59 mL/min — AB (ref >60)
GFR, EST NON AFRICAN AMERICAN: 51 mL/min — AB (ref >60)
Glucose, Bld: 109 mg/dL — ABNORMAL HIGH (ref 65–99)
POTASSIUM: 4.8 mmol/L (ref 3.5–5.1)
SODIUM: 140 mmol/L (ref 135–145)

## 2017-05-18 NOTE — Progress Notes (Signed)
02/02/2017- noted in [place]  05/09/2017- noted in [place] w/diff., BMP and CXR  05/16/2017- last office visit with Rexene Edison, NP, Pulmonary in [place]  10/27/2016- noted in Epic-ECHO

## 2017-05-18 NOTE — Patient Instructions (Addendum)
Jose S Vanderberg Sr.  05/18/2017   Your procedure is scheduled on: Tuesday 05/24/2017  Report to [facility] Main  Entrance              Report to admitting at  [place] AM    Call this number if you have problems the morning of surgery [phone]    Remember: Do not eat food or drink liquids :After Midnight.     Take these medicines the morning of surgery with A SIP OF WATER: Pantoprazole (Protonix), Levothyroxine (Synthroid), Atorvastatin (Lipitor), use Stiolto Respimat inhaler and bring it with you to the hospital                                  You may not have any metal on your body including hair pins and              piercings  Do not wear jewelry, make-up, lotions, powders or perfumes, deodorant                        Men may shave face and neck.   Do not bring valuables to the hospital. [place].  Contacts, dentures or bridgework may not be worn into surgery.  Leave suitcase in the car. After surgery it may be brought to your room.     Patients discharged the day of surgery will not be allowed to drive home.  Name and phone number of your driver:spouse- Gwen               Please read over the following fact sheets you were given: _____________________________________________________________________             [facility] - Preparing for Surgery Before surgery, you can play an important role.  Because skin is not sterile, your skin needs to be as free of germs as possible.  You can reduce the number of germs on your skin by washing with CHG (chlorahexidine gluconate) soap before surgery.  CHG is an antiseptic cleaner which kills germs and bonds with the skin to continue killing germs even after washing. Please DO NOT use if you have an allergy to CHG or antibacterial soaps.  If your skin becomes reddened/irritated stop using the CHG and inform your nurse when you arrive at Short Stay. Do not  shave (including legs and underarms) for at least 48 hours prior to the first CHG shower.  You may shave your face/neck. Please follow these instructions carefully:  1.  Shower with CHG Soap the night before surgery and the  morning of Surgery.  2.  If you choose to wash your hair, wash your hair first as usual with your  normal  shampoo.  3.  After you shampoo, rinse your hair and body thoroughly to remove the  shampoo.                           4.  Use CHG as you would any other liquid soap.  You can apply chg directly  to the skin and wash  Gently with a scrungie or clean washcloth.  5.  Apply the CHG Soap to your body ONLY FROM THE NECK DOWN.   Do not use on face/ open                           Wound or open sores. Avoid contact with eyes, ears mouth and genitals (private parts).                       Wash face,  Genitals (private parts) with your normal soap.             6.  Wash thoroughly, paying special attention to the area where your surgery  will be performed.  7.  Thoroughly rinse your body with warm water from the neck down.  8.  DO NOT shower/wash with your normal soap after using and rinsing off  the CHG Soap.                9.  Pat yourself dry with a clean towel.            10.  Wear clean pajamas.            11.  Place clean sheets on your bed the night of your first shower and do not  sleep with pets. Day of Surgery : Do not apply any lotions/deodorants the morning of surgery.  Please wear clean clothes to the hospital/surgery center.  FAILURE TO FOLLOW THESE INSTRUCTIONS MAY RESULT IN THE CANCELLATION OF YOUR SURGERY PATIENT SIGNATURE_________________________________  NURSE SIGNATURE__________________________________  ________________________________________________________________________

## 2017-05-20 DIAGNOSIS — M1712 Unilateral primary osteoarthritis, left knee: Secondary | ICD-10-CM | POA: Diagnosis not present

## 2017-05-20 DIAGNOSIS — M1711 Unilateral primary osteoarthritis, right knee: Secondary | ICD-10-CM | POA: Diagnosis not present

## 2017-05-20 DIAGNOSIS — G5601 Carpal tunnel syndrome, right upper limb: Secondary | ICD-10-CM | POA: Diagnosis not present

## 2017-05-23 ENCOUNTER — Encounter: Payer: Self-pay | Admitting: *Deleted

## 2017-05-23 DIAGNOSIS — Z9981 Dependence on supplemental oxygen: Secondary | ICD-10-CM | POA: Insufficient documentation

## 2017-05-23 DIAGNOSIS — H353132 Nonexudative age-related macular degeneration, bilateral, intermediate dry stage: Secondary | ICD-10-CM | POA: Diagnosis not present

## 2017-05-23 DIAGNOSIS — H1820 Unspecified corneal edema: Secondary | ICD-10-CM | POA: Diagnosis not present

## 2017-05-23 DIAGNOSIS — Z961 Presence of intraocular lens: Secondary | ICD-10-CM | POA: Diagnosis not present

## 2017-05-23 MED ORDER — AZTREONAM 2 G IJ SOLR
2.0000 g | Freq: Once | INTRAMUSCULAR | Status: AC
  Administered 2017-05-24: 2 g via INTRAVENOUS
  Filled 2017-05-23: qty 2

## 2017-05-23 NOTE — H&P (Signed)
Office Visit Report     05/11/2017   --------------------------------------------------------------------------------   Jose Lawson  MRN: [id number]  PRIMARY CARE:  Ermalene Searing. Philip Aspen, MD  DOB: 09-15-1925, 84 year old Male  REFERRING:  Ammie Dalton, NP  SSN: -[id number]  PROVIDER:  Festus Aloe, M.D.    LOCATION:  [facility] [phone]   --------------------------------------------------------------------------------   CC: I have bladder cancer.  HPI: Jose Lawson is a 84 year-old male established patient who is here for bladder cancer.  His bladder cancer was treated by removal with scope. Patient denies removal of the entire bladder, radiation, and chemotherapy.   His last cysto was 01/06/2017.   His last U/S or CT Scan was 06/18/2016.   Dec 2012 LG Ta and CIS.  September 2016 left posterior CIS recurrence. Biopsied and fulgurated.  May 2017 - focal CIS - left post and dome - fulgurated  Jun 2018 cysto, bbx - post and dome CIS; bilateral washes (and RGP (normal)   Last upper tract imaging:  Jun 2018 bilateral RGP's / washes  May 2017 bilateral RGP's / washes - negative  May 2015 - CT abdomen and pelvis   Last BCG: Feb 2014 - stopped due to BCG fever (fever 103 after BCG 1/2 dose. Urine & blood cultures negative). Jan 2013 completed 3 doses- had Enterococcus infection and subsequent left epididymitis. Had BCG reaction after #4 resulting in severe LUTS.   Last cytology: May 2018 - suspicious - taken to OR.  Apr 2017 - suspicious-taken for biopsy   He saw Dr. Alen Blew and decided against mitomycin-C installations.   He returns for cystoscopy. He's had issues with cystitis following cystoscopy. Last culture grew Pseudomonas. UA clear today. He took 250 mg Cipro nightly and his dysuria mostly cleared. He does have recurrence of left testicular pain.     ALLERGIES: Doxycycline Hyclate CAPS - Other Reaction, unknown Tamsulosin - Dizziness     MEDICATIONS: Levothyroxine Sodium 175 mcg tablet  Alfuzosin Hcl Er 10 mg tablet, extended release 24 hr 1 tablet PO Q AM  Atorvastatin Calcium 20 MG Oral Tablet Oral  Gabapentin 300 mg tablet Oral  Levothyroxine Sodium 150 MCG Oral Tablet Oral  Losartan Potassium 50 mg tablet  Miralax  Nitroglycerin 0.4 mg tablet, sublingual  Pantoprazole Sodium 40 mg tablet, delayed release Oral  Polysaccharide Iron 150  Stiolto Respimat 2.5 mcg-2.5 mcg/actuation mist inhaler  Testosterone Enanthate  Triamterene-Hydrochlorothiazid 37.5 mg-25 mg capsule Oral  Uribel 118 mg-10 mg-40.8 mg-36 mg-0.12 mg capsule 1 capsule PO TID PRN  Vitamin B12  Vitamin D3     GU PSH: Catheterization For Collection Of Specimen, Single Patient, All Places Of Service - 07/09/2015 Catheterize For Residual - 07/09/2015 Cysto Bladder Ureth Biopsy - 2012 Cysto Uretero Biopsy Fulgura - 06/18/2016 Cystoscopy - 01/06/2017, 09/28/2016, 05/17/2016, 11/12/2015, 08/04/2015 Cystoscopy TURBT <2 cm - 06/18/2016, 05/30/2015 Cystoscopy TURBT 2-5 cm - 2016 Cystoscopy Ureteroscopy - 2012 Laser Surgery Prostate - 2013 N Block Inj Ilio-ing/hypogi - 11/12/2015    NON-GU PSH: Knee Arthroscopy; Dx - 2017 Remove Thyroid - 2010    GU PMH: Chronic cystitis (w/o hematuria) - 03/28/2017, - 02/25/2017 Acute Cystitis/UTI - 03/14/2017, - 01/25/2017 BPH w/LUTS - 01/27/2017 Nocturia - 01/27/2017 Urinary Frequency (Stable) - 01/27/2017, Increased urinary frequency, - 2015 Bladder Cancer Lateral - 01/06/2017, - 09/28/2016, Malignant neoplasm of lateral wall of urinary bladder, - 2016 Left testicular pain - 09/28/2016 Epididymitis, Left - 08/27/2016, Epididymitis, left, - 2015 Bladder Cancer overlapping sites - 06/29/2016,  Malignant neoplasm of overlapping sites of bladder, - 2016 Dysuria - 06/29/2016, (Worsening, Chronic), - 08/07/2015, - 08/04/2015, - 07/09/2015, Dysuria, - 06/10/2015 Bladder Cancer Posterior - 05/17/2016, - 11/12/2015, - 06/25/2015 Disorder of male  genital organs, unspecified - 05/17/2016, - 11/12/2015 Abdominal Pain Unspec - 06/25/2015 Balanitis, Balanitis - 06/10/2015 CIS of the bladder, CIS (carcinoma in situ of bladder) - 2017 BPH w/o LUTS, BPH without urinary obstruction - 2016 Testicular pain, unspecified, Testicle pain - 2015 Urinary Retention, Unspec, Incomplete bladder emptying - 2015 Primary hypogonadism, Hypogonadism, testicular - 2015 History of bladder cancer, History of bladder cancer - 2015 Urinary Tract Inf, Unspec site, Urinary tract infection - 2014 Urinary Urgency, Urinary urgency - 2014 Weak Urinary Stream, Weak urinary stream - 2014      PMH Notes:  2006-11-07 12:40:02 - Note: Thyroid Cancer   NON-GU PMH: Encounter for general adult medical examination without abnormal findings, Encounter for preventive health examination - 2015 Personal history of other diseases of the circulatory system, History of hypertension - 2014 Personal history of other endocrine, nutritional and metabolic disease, History of hypercholesterolemia - 2014    FAMILY HISTORY: Family Health Status Number - Runs In Family Hypertension - Runs In Family   SOCIAL HISTORY: Marital Status: Married Preferred Language: English; Ethnicity: Not Hispanic Or Latino Current Smoking Status: Patient does not smoke anymore.  Drinks 2 drinks per day.  Drinks 1 caffeinated drink per day.    REVIEW OF SYSTEMS:    GU Review Male:   Patient reports burning/ pain with urination. Patient denies frequent urination, hard to postpone urination, get up at night to urinate, leakage of urine, stream starts and stops, trouble starting your stream, have to strain to urinate , erection problems, and penile pain.  Gastrointestinal (Upper):   Patient denies nausea, vomiting, and indigestion/ heartburn.  Gastrointestinal (Lower):   Patient denies diarrhea and constipation.  Constitutional:   Patient denies fever, night sweats, weight loss, and fatigue.  Skin:   Patient  denies skin rash/ lesion and itching.  Eyes:   Patient denies blurred vision and double vision.  Ears/ Nose/ Throat:   Patient denies sore throat and sinus problems.  Hematologic/Lymphatic:   Patient denies swollen glands and easy bruising.  Cardiovascular:   Patient denies leg swelling and chest pains.  Respiratory:   Patient denies cough and shortness of breath.  Endocrine:   Patient denies excessive thirst.  Musculoskeletal:   Patient denies back pain and joint pain.  Neurological:   Patient denies headaches and dizziness.  Psychologic:   Patient denies depression and anxiety.   VITAL SIGNS:      05/11/2017 09:50 AM  BP 120/71 mmHg  Pulse 96 /min  Temperature 97.1 F / 36.1 C   GU PHYSICAL EXAMINATION:    Scrotum: No lesions. No edema. No cysts. No warts.  Urethral Meatus: Normal size. No lesion, no wart, no discharge, no polyp. Normal location.  Penis: Circumcised, no warts, no cracks. No dorsal Peyronie's plaques, no left corporal Peyronie's plaques, no right corporal Peyronie's plaques, no scarring, no warts. No balanitis, no meatal stenosis.   MULTI-SYSTEM PHYSICAL EXAMINATION:    Constitutional: Well-nourished. No physical deformities. Normally developed. Lawson grooming.  Neck: Neck symmetrical, not swollen. Normal tracheal position.  Respiratory: No labored breathing, no use of accessory muscles. Normal breath sounds.  Cardiovascular: Regular rate and rhythm. Normal temperature, normal extremity pulses, no swelling, no varicosities.   Skin: No paleness, no jaundice, no cyanosis. No lesion, no ulcer, no rash.  Neurologic / Psychiatric: Oriented to time, oriented to place, oriented to person. No depression, no anxiety, no agitation.  Gastrointestinal: No mass, no tenderness, no rigidity, non obese abdomen.     PAST DATA REVIEWED:  Source Of History:  Patient   09/28/07 09/26/06 11/23/05 06/30/04  PSA  Total PSA 1.24  0.95  1.95  1.01     PROCEDURES:         Flexible  Cystoscopy - 52000  Risks, benefits, and some of the potential complications of the procedure were discussed with the patient. All questions were answered. Informed consent was obtained. Antibiotic prophylaxis was given -- Cephalexin. Sterile technique and intraurethral analgesia were used.  Meatus:  Normal size. Normal location. Normal condition.  Urethra:  No strictures.  External Sphincter:  Normal.  Verumontanum:  Normal.  Prostate:  Well resected prostate fossa. Non-obstructing. No hyperplasia.  Bladder Neck:  Non-obstructing.  Ureteral Orifices:  Normal location. Normal size. Normal shape. Effluxed clear urine.  Bladder:  Severe trabeculation. A few posterior wall tumors - right. Normal mucosa. No stones.      The lower urinary tract was carefully examined. The procedure was well-tolerated and without complications. Antibiotic instructions were given. Instructions were given to call the office immediately for bloody urine, difficulty urinating, painful urination, fever, chills, nausea, vomiting or other illness. The patient stated that he understood these instructions and would comply with them.         Urinalysis Dipstick Dipstick Cont'd  Color: Yellow Bilirubin: Neg  Appearance: Clear Ketones: Neg  Specific Gravity: 1.025 Blood: Neg  pH: <=5.0 Protein: Trace  Glucose: Neg Urobilinogen: 0.2    Nitrites: Neg    Leukocyte Esterase: Neg    ASSESSMENT:      ICD-10 Details  1 GU:   Bladder Cancer Lateral - C67.2   2   Left testicular pain - N50.812    PLAN:            Medications Stop Meds: Cipro 250 mg tablet 1 table by mouth BID; then 1 table by mouth QHS  Start: 04/11/2017  Discontinue: 05/11/2017  - Reason: The medication cycle was completed.  Monurol 3 gram packet 1 packet dissolved in water by mouth q 72hours.  Start: 02/25/2017  Discontinue: 05/11/2017  - Reason: The medication cycle was completed.            Schedule Return Visit/Planned Activity: Next Available  Appointment - Schedule Surgery          Document Letter(s):  Created for Patient: Clinical Summary         Notes:   Bladder cancer-a few small posterior tumors. Discussed the nature risk and benefits of continued surveillance versus cystoscopy bladder biopsy fulguration, retrograde pyelograms. He elects to proceed with the OR as these are clearly progressing. He also has left testicular pain and we discussed trial of another cord block and he elects to proceed with that as well.   Cc: Dr. Philip Aspen    * Signed by Festus Aloe, M.D. on 05/12/17 at 10:30 AM (EDT)*     The information contained in this medical record document is considered private and confidential patient information. This information can only be used for the medical diagnosis and/or medical services that are being provided by the patient's selected caregivers. This information can only be distributed outside of the patient's care if the patient agrees and signs waivers of authorization for this information to be sent to an outside source or route.

## 2017-05-24 ENCOUNTER — Ambulatory Visit (HOSPITAL_COMMUNITY): Payer: Medicare Other | Admitting: Certified Registered Nurse Anesthetist

## 2017-05-24 ENCOUNTER — Other Ambulatory Visit: Payer: Self-pay

## 2017-05-24 ENCOUNTER — Encounter (HOSPITAL_COMMUNITY)
Admission: RE | Disposition: A | Discharge status: Home / Self Care | Payer: Self-pay | Source: Ambulatory Visit | Attending: Urology

## 2017-05-24 ENCOUNTER — Inpatient Hospital Stay: Admission: RE | Admit: 2017-05-24 | Payer: Medicare Other | Source: Ambulatory Visit

## 2017-05-24 ENCOUNTER — Ambulatory Visit (HOSPITAL_COMMUNITY)
Admission: RE | Admit: 2017-05-24 | Discharge: 2017-05-24 | Disposition: A | Discharge status: Home / Self Care | Payer: Medicare Other | Source: Ambulatory Visit | Attending: Urology | Admitting: Urology

## 2017-05-24 ENCOUNTER — Encounter (HOSPITAL_COMMUNITY): Payer: Self-pay | Admitting: *Deleted

## 2017-05-24 DIAGNOSIS — E7800 Pure hypercholesterolemia, unspecified: Secondary | ICD-10-CM | POA: Diagnosis not present

## 2017-05-24 DIAGNOSIS — Z888 Allergy status to other drugs, medicaments and biological substances status: Secondary | ICD-10-CM | POA: Diagnosis not present

## 2017-05-24 DIAGNOSIS — J449 Chronic obstructive pulmonary disease, unspecified: Secondary | ICD-10-CM | POA: Diagnosis not present

## 2017-05-24 DIAGNOSIS — M1990 Unspecified osteoarthritis, unspecified site: Secondary | ICD-10-CM | POA: Insufficient documentation

## 2017-05-24 DIAGNOSIS — Z87891 Personal history of nicotine dependence: Secondary | ICD-10-CM | POA: Insufficient documentation

## 2017-05-24 DIAGNOSIS — C672 Malignant neoplasm of lateral wall of bladder: Secondary | ICD-10-CM | POA: Diagnosis not present

## 2017-05-24 DIAGNOSIS — I2510 Atherosclerotic heart disease of native coronary artery without angina pectoris: Secondary | ICD-10-CM | POA: Insufficient documentation

## 2017-05-24 DIAGNOSIS — E890 Postprocedural hypothyroidism: Secondary | ICD-10-CM | POA: Diagnosis not present

## 2017-05-24 DIAGNOSIS — K219 Gastro-esophageal reflux disease without esophagitis: Secondary | ICD-10-CM | POA: Diagnosis not present

## 2017-05-24 DIAGNOSIS — C679 Malignant neoplasm of bladder, unspecified: Secondary | ICD-10-CM | POA: Diagnosis not present

## 2017-05-24 DIAGNOSIS — D414 Neoplasm of uncertain behavior of bladder: Secondary | ICD-10-CM

## 2017-05-24 DIAGNOSIS — N50819 Testicular pain, unspecified: Secondary | ICD-10-CM | POA: Diagnosis not present

## 2017-05-24 DIAGNOSIS — Z8551 Personal history of malignant neoplasm of bladder: Secondary | ICD-10-CM | POA: Insufficient documentation

## 2017-05-24 DIAGNOSIS — Z8249 Family history of ischemic heart disease and other diseases of the circulatory system: Secondary | ICD-10-CM | POA: Insufficient documentation

## 2017-05-24 DIAGNOSIS — N3080 Other cystitis without hematuria: Secondary | ICD-10-CM | POA: Diagnosis not present

## 2017-05-24 DIAGNOSIS — Z79899 Other long term (current) drug therapy: Secondary | ICD-10-CM | POA: Diagnosis not present

## 2017-05-24 DIAGNOSIS — G4733 Obstructive sleep apnea (adult) (pediatric): Secondary | ICD-10-CM | POA: Diagnosis not present

## 2017-05-24 DIAGNOSIS — C674 Malignant neoplasm of posterior wall of bladder: Secondary | ICD-10-CM | POA: Insufficient documentation

## 2017-05-24 DIAGNOSIS — I10 Essential (primary) hypertension: Secondary | ICD-10-CM | POA: Diagnosis not present

## 2017-05-24 DIAGNOSIS — Z9981 Dependence on supplemental oxygen: Secondary | ICD-10-CM | POA: Diagnosis not present

## 2017-05-24 DIAGNOSIS — Z85850 Personal history of malignant neoplasm of thyroid: Secondary | ICD-10-CM | POA: Insufficient documentation

## 2017-05-24 HISTORY — DX: Dependence on supplemental oxygen: Z99.81

## 2017-05-24 HISTORY — PX: CYSTOSCOPY WITH BIOPSY: SHX5122

## 2017-05-24 HISTORY — DX: Dyspnea, unspecified: R06.00

## 2017-05-24 SURGERY — CYSTOSCOPY, WITH BIOPSY
Anesthesia: General

## 2017-05-24 MED ORDER — FENTANYL CITRATE (PF) 100 MCG/2ML IJ SOLN
INTRAMUSCULAR | Status: DC | PRN
  Administered 2017-05-24: 25 ug via INTRAVENOUS

## 2017-05-24 MED ORDER — LACTATED RINGERS IV SOLN
INTRAVENOUS | Status: DC
  Administered 2017-05-24: 08:00:00 via INTRAVENOUS

## 2017-05-24 MED ORDER — LIDOCAINE HCL (PF) 1 % IJ SOLN
INTRAMUSCULAR | Status: DC | PRN
Start: 2017-05-24 — End: 2017-05-24
  Administered 2017-05-24: 5 mL

## 2017-05-24 MED ORDER — DEXAMETHASONE SODIUM PHOSPHATE 10 MG/ML IJ SOLN
INTRAMUSCULAR | Status: DC | PRN
  Administered 2017-05-24: 10 mg via INTRAVENOUS

## 2017-05-24 MED ORDER — ONDANSETRON HCL 4 MG/2ML IJ SOLN
INTRAMUSCULAR | Status: DC | PRN
  Administered 2017-05-24: 4 mg via INTRAVENOUS

## 2017-05-24 MED ORDER — FENTANYL CITRATE (PF) 100 MCG/2ML IJ SOLN
INTRAMUSCULAR | Status: AC
  Filled 2017-05-24: qty 2

## 2017-05-24 MED ORDER — HYDROMORPHONE HCL 1 MG/ML IJ SOLN
0.2500 mg | INTRAMUSCULAR | Status: DC | PRN
  Administered 2017-05-24: 0.5 mg via INTRAVENOUS
  Administered 2017-05-24: 0.25 mg via INTRAVENOUS

## 2017-05-24 MED ORDER — LIDOCAINE HCL (PF) 1 % IJ SOLN
INTRAMUSCULAR | Status: AC
  Filled 2017-05-24: qty 30

## 2017-05-24 MED ORDER — BUPIVACAINE HCL (PF) 0.25 % IJ SOLN
INTRAMUSCULAR | Status: AC
Start: 2017-05-24 — End: ?
  Filled 2017-05-24: qty 30

## 2017-05-24 MED ORDER — MIDAZOLAM HCL 2 MG/2ML IJ SOLN: 0.5000 mg | Freq: Once | INTRAMUSCULAR | Status: DC | PRN

## 2017-05-24 MED ORDER — LIDOCAINE 2% (20 MG/ML) 5 ML SYRINGE
INTRAMUSCULAR | Status: DC | PRN
  Administered 2017-05-24: 80 mg via INTRAVENOUS

## 2017-05-24 MED ORDER — BUPIVACAINE HCL (PF) 0.25 % IJ SOLN
INTRAMUSCULAR | Status: DC | PRN
  Administered 2017-05-24: 5 mL

## 2017-05-24 MED ORDER — LIDOCAINE HCL URETHRAL/MUCOSAL 2 % EX GEL
CUTANEOUS | Status: AC
  Filled 2017-05-24: qty 5

## 2017-05-24 MED ORDER — SODIUM CHLORIDE 0.9 % IJ SOLN
50.0000 mg | Freq: Once | INTRAVENOUS | Status: AC
  Administered 2017-05-24: 50 mg via INTRAVESICAL
  Filled 2017-05-24: qty 25

## 2017-05-24 MED ORDER — PHENYLEPHRINE HCL 10 MG/ML IJ SOLN
INTRAMUSCULAR | Status: DC | PRN
  Administered 2017-05-24 (×2): 80 ug via INTRAVENOUS
  Administered 2017-05-24: 40 ug via INTRAVENOUS
  Administered 2017-05-24 (×2): 80 ug via INTRAVENOUS

## 2017-05-24 MED ORDER — PROPOFOL 10 MG/ML IV BOLUS
INTRAVENOUS | Status: AC
Start: 2017-05-24 — End: ?
  Filled 2017-05-24: qty 20

## 2017-05-24 MED ORDER — ONDANSETRON HCL 4 MG/2ML IJ SOLN
INTRAMUSCULAR | Status: AC
  Filled 2017-05-24: qty 2

## 2017-05-24 MED ORDER — DEXAMETHASONE SODIUM PHOSPHATE 10 MG/ML IJ SOLN
INTRAMUSCULAR | Status: AC
  Filled 2017-05-24: qty 1

## 2017-05-24 MED ORDER — PROMETHAZINE HCL 25 MG/ML IJ SOLN: 6.2500 mg | INTRAMUSCULAR | Status: DC | PRN

## 2017-05-24 MED ORDER — HYDROMORPHONE HCL 1 MG/ML IJ SOLN
INTRAMUSCULAR | Status: AC
  Filled 2017-05-24: qty 1

## 2017-05-24 MED ORDER — MEPERIDINE HCL 50 MG/ML IJ SOLN: 6.2500 mg | INTRAMUSCULAR | Status: DC | PRN

## 2017-05-24 MED ORDER — CIPROFLOXACIN HCL 500 MG PO TABS: 500.0000 mg | ORAL_TABLET | Freq: Every day | ORAL | 0 refills | Status: DC

## 2017-05-24 MED ORDER — STERILE WATER FOR IRRIGATION IR SOLN
Status: DC | PRN
  Administered 2017-05-24: 3000 mL

## 2017-05-24 MED ORDER — LIDOCAINE HCL URETHRAL/MUCOSAL 2 % EX GEL
CUTANEOUS | Status: DC | PRN
  Administered 2017-05-24: 1 via URETHRAL

## 2017-05-24 MED ORDER — PROPOFOL 10 MG/ML IV BOLUS
INTRAVENOUS | Status: DC | PRN
  Administered 2017-05-24: 150 mg via INTRAVENOUS

## 2017-05-24 SURGICAL SUPPLY — 14 items
BAG URINE DRAINAGE (UROLOGICAL SUPPLIES) ×2 IMPLANT
BAG URO CATCHER STRL LF (MISCELLANEOUS) ×2 IMPLANT
CATH FOLEY 2WAY SLVR  5CC 18FR (CATHETERS) ×1
CATH FOLEY 2WAY SLVR 5CC 18FR (CATHETERS) ×1 IMPLANT
COVER FOOTSWITCH UNIV (MISCELLANEOUS) IMPLANT
COVER SURGICAL LIGHT HANDLE (MISCELLANEOUS) IMPLANT
GLOVE BIOGEL M STRL SZ7.5 (GLOVE) ×2 IMPLANT
GOWN STRL REUS W/TWL XL LVL3 (GOWN DISPOSABLE) ×2 IMPLANT
LOOP CUT BIPOLAR 24F LRG (ELECTROSURGICAL) IMPLANT
MANIFOLD NEPTUNE II (INSTRUMENTS) ×2 IMPLANT
PACK CYSTO (CUSTOM PROCEDURE TRAY) ×2 IMPLANT
PLUG CATH AND CAP STER (CATHETERS) ×2 IMPLANT
TUBING CONNECTING 10 (TUBING) ×2 IMPLANT
TUBING UROLOGY SET (TUBING) ×2 IMPLANT

## 2017-05-24 NOTE — Anesthesia Procedure Notes (Signed)
Procedure Name: LMA Insertion Date/Time: 05/24/2017 9:56 AM Performed by: West Pugh, CRNA Pre-anesthesia Checklist: Patient identified, Emergency Drugs available, Suction available, Patient being monitored and Timeout performed Patient Re-evaluated:Patient Re-evaluated prior to induction Oxygen Delivery Method: Circle system utilized Preoxygenation: Pre-oxygenation with 100% oxygen Induction Type: IV induction LMA: LMA with gastric port inserted LMA Size: 4.0 Number of attempts: 1 Intubation method: patient positioned with head, neck and spine in neutral position. patient reports comfortable in this position. Tube secured with: Tape Dental Injury: Teeth and Oropharynx as per pre-operative assessment

## 2017-05-24 NOTE — Discharge Instructions (Signed)
Indwelling Urinary Catheter Care, Adult Take good care of your catheter to keep it working and to prevent problems.  Removal of the catheter: Remove the catheter Friday morning, May 27, 2017 as instructed  How to wear your catheter Attach your catheter to your leg with tape (adhesive tape) or a leg strap. Make sure it is not too tight. If you use tape, remove any bits of tape that are already on the catheter. How to wear a drainage bag You should have:  A large overnight bag.  A small leg bag.  Overnight Bag You may wear the overnight bag at any time. Always keep the bag below the level of your bladder but off the floor. When you sleep, put a clean plastic bag in a wastebasket. Then hang the bag inside the wastebasket. Leg Bag Never wear the leg bag at night. Always wear the leg bag below your knee. Keep the leg bag secure with a leg strap or tape. How to care for your skin  Clean the skin around the catheter at least once every day.  Shower every day. Do not take baths.  Put creams, lotions, or ointments on your genital area only as told by your doctor.  Do not use powders, sprays, or lotions on your genital area. How to clean your catheter and your skin 1. Wash your hands with soap and water. 2. Wet a washcloth in warm water and gentle (mild) soap. 3. Use the washcloth to clean the skin where the catheter enters your body. Clean downward and wipe away from the catheter in small circles. Do not wipe toward the catheter. 4. Pat the area dry with a clean towel. Make sure to clean off all soap. How to care for your drainage bags Empty your drainage bag when it is ?- full or at least 2-3 times a day. Replace your drainage bag once a month or sooner if it starts to smell bad or look dirty. Do not clean your drainage bag unless told by your doctor. Emptying a drainage bag  Supplies Needed  Rubbing alcohol.  Gauze pad or cotton ball.  Tape or a leg strap.  Steps 1. Wash your  hands with soap and water. 2. Separate (detach) the bag from your leg. 3. Hold the bag over the toilet or a clean container. Keep the bag below your hips and bladder. This stops pee (urine) from going back into the tube. 4. Open the pour spout at the bottom of the bag. 5. Empty the pee into the toilet or container. Do not let the pour spout touch any surface. 6. Put rubbing alcohol on a gauze pad or cotton ball. 7. Use the gauze pad or cotton ball to clean the pour spout. 8. Close the pour spout. 9. Attach the bag to your leg with tape or a leg strap. 10. Wash your hands.  Changing a drainage bag Supplies Needed  Alcohol wipes.  A clean drainage bag.  Adhesive tape or a leg strap.  Steps 1. Wash your hands with soap and water. 2. Separate the dirty bag from your leg. 3. Pinch the rubber catheter with your fingers so that pee does not spill out. 4. Separate the catheter tube from the drainage tube where these tubes connect (at the connection valve). Do not let the tubes touch any surface. 5. Clean the end of the catheter tube with an alcohol wipe. Use a different alcohol wipe to clean the end of the drainage tube. 6. Connect the  catheter tube to the drainage tube of the clean bag. 7. Attach the new bag to the leg with adhesive tape or a leg strap. 8. Wash your hands.  How to prevent infection and other problems  Never pull on your catheter or try to remove it. Pulling can damage tissue in your body.  Always wash your hands before and after touching your catheter.  If a leg strap gets wet, replace it with a dry one.  Drink enough fluids to keep your pee clear or pale yellow, or as told by your doctor.  Do not let the drainage bag or tubing touch the floor.  Wear cotton underwear.  If you are male, wipe from front to back after you poop (have a bowel movement).  Check on the catheter often to make sure it works and the tubing is not twisted. Get help if:  Your pee is  cloudy.  Your pee smells unusually bad.  Your pee is not draining into the bag.  Your tube gets clogged.  Your catheter starts to leak.  Your bladder feels full. Get help right away if:  You have redness, swelling, or pain where the catheter enters your body.  You have fluid, pus, or a bad smell coming from the area where the catheter enters your body.  The area where the catheter enters your body feels warm.  You have a fever.  You have pain in your: ? Stomach (abdomen). ? Legs. ? Lower back. ? Bladder.  You see blood fill the catheter.  Your pee is pink or red.  You feel sick to your stomach (nauseous).  You throw up (vomit).  You have chills.  Your catheter gets pulled out. This information is not intended to replace advice given to you by your health care provider. Make sure you discuss any questions you have with your health care provider. Document Released: 04/24/2012 Document Revised: 11/26/2015 Document Reviewed: 06/12/2013 Elsevier Interactive Patient Education  2018 [place].   Bladder Biopsy, Care After Refer to this sheet in the next few weeks. These instructions provide you with information about caring for yourself after your procedure. Your health care provider may also give you more specific instructions. Your treatment has been planned according to current medical practices, but problems sometimes occur. Call your health care provider if you have any problems or questions after your procedure. What can I expect after the procedure? After the procedure, it is common to have:  Mild pain in your bladder or kidney area during urination.  Minor burning during urination.  Small amounts of blood in your urine.  A sudden urge to urinate.  A need to urinate more often than usual.  Follow these instructions at home: Medicines  Take over-the-counter and prescription medicines only as told by your health care provider.  If you were prescribed  an antibiotic medicine, take it as told by your health care provider. Do not stop taking the antibiotic even if you start to feel better. General instructions   Take a warm bath to relieve any burning sensations around your urethra.  Hold a warm, damp washcloth over the urethral area to ease pain.  Return to your normal activities as told by your health care provider. Ask your health care provider what activities are safe for you.  Do not drive for 24 hours if you received a medicine to help you relax (sedative) during your procedure. Ask your health care provider when it is safe for you to drive.  It is your responsibility to get the results of your procedure. Ask your health care provider or the department performing the procedure when your results will be ready.  Keep all follow-up visits as told by your health care provider. This is important. Contact a health care provider if:  You have a fever.  Your symptoms do not improve within 24 hours and you continue to have: ? Burning during urination. ? Increasing amounts of blood in your urine. ? Pain during urination. ? An urgent need to urinate. ? A need to urinate more often than usual. Get help right away if:  You have a lot of bleeding or more bleeding.  You have severe pain.  You are unable to urinate.  You have bright red blood in your urine.  You are passing blood clots in your urine.  You have a fever.  You have swelling, redness, or pain in your legs.  You have difficulty breathing. This information is not intended to replace advice given to you by your health care provider. Make sure you discuss any questions you have with your health care provider. Document Released: 01/14/2015 Document Revised: 06/05/2015 Document Reviewed: 01/14/2015 Elsevier Interactive Patient Education  [employer].

## 2017-05-24 NOTE — Op Note (Signed)
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  He had fever with BCG and declined intravesical chemotherapy.  He had recurrences and was brought back today for above procedures.  He gets intermittent left testicular pain and a cord block helped in the past.  Findings: On exam under anesthesia the penis was uncircumcised, there was no phimosis, the foreskin was normal.  The testicles were descended bilaterally and palpably normal.  On digital rectal exam the prostate was small, smooth without hard area or nodule.  On cystoscopy the bladder is severely trabeculated.  There was some tumor along the right bladder and right posterior.  A suspicious area right superior.  The posterior bladder had several scattered cyst like cystitis cystica.  This was biopsied.  Description of procedure: After consent was obtained the patient brought to the operating room.  After adequate anesthesia was placed in lithotomy position and prepped and draped in the usual sterile fashion.  A timeout was performed to confirm the patient and procedure.  The penis and the testicles were examined.  The left cord was grasped.  I could palpate the left vas and the left cord.  10 cc of 1% lidocaine and quarter percent Marcaine both plain were instilled medial around the vas, in the middle of the cord, and lateral cord.  Aspiration at each location revealed no backflow.  I held pressure for about 45 seconds.  There were no apparent complications.  I then passed the cystoscope per urethra and carefully inspected the bladder with a 30 degree and 70 degree lens.  There was clear efflux bilaterally.  I then took the cold cup  biopsy forceps, rigid, and biopsied the right neoplastic appearing tumor.  This removed it in its entirety and then a fulgurated in the area of about 15 mm.  In the right posterior there was another tumor settled down into a cellule which was grasped with the cold cups and removed with 2 bites.  This area was fulgurated again about  15 to 20 mm.  Above these areas another suspicious area was biopsied.  The area in the posterior bladder consistent with cystitis cystica was biopsied and fulgurated.  Careful inspection revealed there to be no other areas.  Hemostasis was excellent at low pressure.  The scope was backed out.  I instilled lidocaine jelly per urethra and placed an 18 French catheter and left at this gravity drainage.   digital rectal exam was performed.  He was awakened taken to the recovery room in stable condition.  Instillation of epirubicin in PACU: Epirubicin 50 mg and 45 cc was instilled per urethra and left indwelling for 50 minutes.  It was then drained.  Complications: None  Blood loss: Minimal  Specimens to pathology: #1 right bladder biopsy #2 right posterior #3 right superior #4 posterior  Drains: 18 French Foley  Disposition: Patient stable to PACU  Of

## 2017-05-24 NOTE — Interval H&P Note (Signed)
History and Physical Interval Note:  05/24/2017 9:35 AM  Jose S Cammon Sr.  has presented today for surgery, with the diagnosis of BLADDER CANCER, NEOPLASM, LEFT TESTICLE PAIN  The various methods of treatment have been discussed with the patient and family. After consideration of risks, benefits and other options for treatment, the patient has consented to  Procedure(s): CYSTOSCOPY WITH BIOPSY/ FULGURATION (N/A) as a surgical intervention .  The patient's history has been reviewed, patient examined, no change in status, stable for surgery.  I have reviewed the patient's chart and labs.  Questions were answered to the patient's satisfaction.  He asked how long this will go on and we discussed as long as he can handle endoscopic procedures and as long as there is no progression. We discussed again office based bcg or chemo instillations. They will continue biopsies. Denies dysuria and gross hematuria. Also discussed nature r/b/a to single post-op epirubicin instillation which they did elect to proceed with if indicated. No hematuria, fever or dysuria.    Festus Aloe

## 2017-05-24 NOTE — Anesthesia Preprocedure Evaluation (Addendum)
Anesthesia Evaluation  Patient identified by MRN, date of birth, ID band Patient awake    Reviewed: NPO status , Patient's Chart, lab work & pertinent test results  History of Anesthesia Complications Negative for: history of anesthetic complications  Airway Mallampati: I  TM Distance: >3 FB Neck ROM: Limited    Dental  (+) Dental Advisory Given   Pulmonary COPD,  COPD inhaler and oxygen dependent, former smoker (quit 1984),    breath sounds clear to auscultation       Cardiovascular hypertension, Pt. on medications (-) angina+ CAD (CT: CTA noted coronary atherosclerosis and findigs c/w p.HTN)   Rhythm:Regular Rate:Normal  10/18 ECHO; EF 60-65%, valves OK   Neuro/Psych negative neurological ROS     GI/Hepatic Neg liver ROS, GERD  Medicated and Controlled,  Endo/Other  Hypothyroidism H/o thyroid cancer  Renal/GU    Bladder cancer    Musculoskeletal  (+) Arthritis ,   Abdominal   Peds  Hematology negative hematology ROS (+)   Anesthesia Other Findings   Reproductive/Obstetrics                            Anesthesia Physical Anesthesia Plan  ASA: III  Anesthesia Plan: General   Post-op Pain Management:    Induction: Intravenous  PONV Risk Score and Plan: 3 and Treatment may vary due to age or medical condition, Ondansetron and Dexamethasone  Airway Management Planned: LMA  Additional Equipment:   Intra-op Plan:   Post-operative Plan:   Informed Consent: I have reviewed the patients History and Physical, chart, labs and discussed the procedure including the risks, benefits and alternatives for the proposed anesthesia with the patient or authorized representative who has indicated his/her understanding and acceptance.   Dental advisory given  Plan Discussed with: CRNA and Surgeon  Anesthesia Plan Comments: (Plan routine monitors, GA -LMA OK)        Anesthesia Quick  Evaluation

## 2017-05-24 NOTE — Transfer of Care (Signed)
Immediate Anesthesia Transfer of Care Note  Patient: Jose Burly Reinig Sr.  Procedure(s) Performed: CYSTOSCOPY WITH BIOPSY/ FULGURATION 0.5 TO 2CM AND LEFT CORD BLOCK (N/A )  Patient Location: PACU  Anesthesia Type:General  Level of Consciousness: awake, alert , oriented and patient cooperative  Airway & Oxygen Therapy: Patient Spontanous Breathing and Patient connected to face mask oxygen  Post-op Assessment: Report given to RN and Post -op Vital signs reviewed and stable  Post vital signs: Reviewed and stable  Last Vitals:  Vitals Value Taken Time  BP 154/75 05/24/2017 10:45 AM  Temp    Pulse 76 05/24/2017 10:49 AM  Resp 20 05/24/2017 10:48 AM  SpO2 100 % 05/24/2017 10:49 AM  Vitals shown include unvalidated device data.  Last Pain:  Vitals:   05/24/17 1045  TempSrc:   PainSc: (P) 0-No pain         Complications: No apparent anesthesia complications

## 2017-05-24 NOTE — Anesthesia Postprocedure Evaluation (Signed)
Anesthesia Post Note  Patient: Heston S Hackbart Sr.  Procedure(s) Performed: CYSTOSCOPY WITH BIOPSY/ FULGURATION 0.5 TO 2CM AND LEFT CORD BLOCK (N/A )     Patient location during evaluation: PACU Anesthesia Type: General Level of consciousness: awake and alert, oriented and patient uncooperative Pain management: pain level controlled Vital Signs Assessment: post-procedure vital signs reviewed and stable Respiratory status: spontaneous breathing, nonlabored ventilation, respiratory function stable and patient connected to nasal cannula oxygen Cardiovascular status: blood pressure returned to baseline and stable Postop Assessment: no apparent nausea or vomiting Anesthetic complications: no    Last Vitals:  Vitals:   05/24/17 1230 05/24/17 1313  BP: (!) 157/68 (!) 159/72  Pulse: 71 79  Resp: 11 14  Temp: (!) 36.3 C 36.6 C  SpO2: 94% 90%    Last Pain:  Vitals:   05/24/17 1215  TempSrc:   PainSc: 1                  Lennon Richins,E. Nasiah Polinsky

## 2017-05-26 ENCOUNTER — Telehealth: Payer: Self-pay | Admitting: Internal Medicine

## 2017-05-26 ENCOUNTER — Inpatient Hospital Stay: Admission: RE | Admit: 2017-05-26 | Payer: Medicare Other | Source: Ambulatory Visit

## 2017-05-26 NOTE — Telephone Encounter (Signed)
Called and spoke to [place], patient's wife. She stated she wants to check to see if patient should have HR CT scan tomorrow 5/17 at 3:15 or should he reschedule. Patient's wife reports that patient is on his 3rd day of having a cough but no fever. She reported that she gave patient her cold. She would like MR's opinion on getting the scan.  MR please advise.

## 2017-05-26 NOTE — Telephone Encounter (Signed)
Spoke with Gwen and notified of recs per MR  I gave the number to CT so that she can call and rearrange appts  Nothing further needed

## 2017-05-26 NOTE — Telephone Encounter (Signed)
Ok to cancel tomorrow but do next week 06/01/17 or 06/02/17 before seeing Dr Annamaria Boots  Dr. Brand Males, M.D., [facility].C.P Pulmonary and Critical Care Medicine Staff Physician, [place] Director - Interstitial Lung Disease  Program  Pulmonary [place] at [place], [place], [address]  Pager: [phone], If no answer or between  15:00h - 7:00h: call [phone] Telephone: [phone]

## 2017-05-27 ENCOUNTER — Encounter: Payer: Self-pay | Admitting: Pulmonary Disease

## 2017-05-27 ENCOUNTER — Telehealth: Payer: Self-pay | Admitting: Internal Medicine

## 2017-05-27 ENCOUNTER — Ambulatory Visit: Payer: Medicare Other | Admitting: Pulmonary Disease

## 2017-05-27 ENCOUNTER — Inpatient Hospital Stay: Admission: RE | Admit: 2017-05-27 | Payer: Medicare Other | Source: Ambulatory Visit

## 2017-05-27 DIAGNOSIS — J449 Chronic obstructive pulmonary disease, unspecified: Secondary | ICD-10-CM | POA: Diagnosis not present

## 2017-05-27 DIAGNOSIS — R0602 Shortness of breath: Secondary | ICD-10-CM | POA: Diagnosis not present

## 2017-05-27 DIAGNOSIS — J9611 Chronic respiratory failure with hypoxia: Secondary | ICD-10-CM | POA: Diagnosis not present

## 2017-05-27 MED ORDER — PREDNISONE 10 MG PO TABS: ORAL_TABLET | ORAL | 0 refills | Status: DC

## 2017-05-27 MED ORDER — CEFUROXIME AXETIL 500 MG PO TABS: 500.0000 mg | ORAL_TABLET | Freq: Two times a day (BID) | ORAL | 0 refills | Status: DC

## 2017-05-27 NOTE — Telephone Encounter (Signed)
Called and spoke to patient. Was able to schedule patient with Wyn Quaker, NP at 1430 today. Nothing further is needed at this time.

## 2017-05-27 NOTE — Assessment & Plan Note (Signed)
Prednisone (10mg  tablet) >>> Take 40mg  x1 day (4 tablets), 30mg  x1day (3 tablets), 20 x1 day (2 tablets), 10mg  x1 day (1 tablet), 5mg  x 1 day (0.5mg  tablet)  06/01/2017- high - resolution - CT >>>already scheduled   >>>06/01/2017  9:15 AM at [place]

## 2017-05-27 NOTE — Assessment & Plan Note (Signed)
Ceftin 500 mg tablet take 1 tablet by mouth twice a day for the next 5 days. >>>Take with meals >>>Eat yogurt or start a probiotic to do the multiple antibiotics you are on   Prednisone (10mg  tablet) >>> Take 40mg  x1 day (4 tablets), 30mg  x1day (3 tablets), 20 x1 day (2 tablets), 10mg  x1 day (1 tablet), 5mg  x 1 day (0.5mg  tablet)  06/01/2017- high - resolution - CT >>>already scheduled   >>>06/01/2017  9:15 AM at [place]  Please contact the office if your respiratory symptoms get worse.  If you start coughing up colored mucus, blood-streaked mucus, or feel like you are having worsening shortness of breath.

## 2017-05-27 NOTE — Telephone Encounter (Signed)
Called and spoke with patients wife, she states that patient has had a cold with chest congestion for 4 days. Patient also has a deep productive cough with yellow mucus. Patient denies fever or body aches.  Patient had a procedure on Tuesday for his bladder cancer and they have him on 500 mg of Cipro each night.   MR please advise what patient can do for this, thanks.

## 2017-05-27 NOTE — Telephone Encounter (Signed)
Patient started taking Cipro 500 mg 1 tab PO at HS for 7 days on 05/24/17.  The urologist is Dr. Festus Aloe at [phone].  Patients wife wanted to make sure you are aware that she thinks he has a chest cold and that she was just trying to keep it from progressing since he required sleeping with multiple pillows behind him last night.  RM please advise. Thank you!

## 2017-05-27 NOTE — Progress Notes (Signed)
@Patient  ID: Jose Good Sr., male    DOB: 1925-10-30, 84 y.o.   MRN: [id number]  Chief Complaint  Patient presents with  . Acute Visit    cough    Referring provider: [name], MD  HPI: 84 year old former smoker followed for COPD and pulmonary hypertension Medical history of thyroid and bladder cancer   05/26/2017 telephone encounter patient has developed cold and wants to reschedule high-res CT needs to complete by 522 or 523 before seeing Dr. Janee Morn  05/27/17 -patient called to report development of cold, after speaking with Dr. Chase Caller and triage nurse patient was scheduled for an appointment today.  Dr. Chase Caller said to go ahead and treat with prednisone and Ceftin.  And stop Cipro.  05/27/17  Patient reports the office today, for follow-up regarding recent development of chest cold as of 05/25/2017.  Patient recently had a bladder procedure to remove polyps on 05/24/2017.  Patient reports that his catheter came out today.  Patient states that on 5/15 he developed a cough with white to clear occasional sputum.  No fever, but feels hot.  Reporting some fatigue as well as dry mouth.  Patient sleeping well.  Patient also reporting that he actually feels better today than he had over the past 2 days but still is not 100%.  Patient also reporting that they will get a high risk CT next week.  And on June 3 we will get an endoscopy as well as a colonoscopy.   No Known Allergies  Immunization History  Administered Date(s) Administered  . Influenza Whole 10/12/2011  . Influenza, High Dose Seasonal PF 10/04/2016  . Pneumococcal Polysaccharide-23 01/20/2014    Past Medical History:  Diagnosis Date  . Arthritis    BACK  . Bifascicular block   . Bladder cancer ([place])   . BPH (benign prostatic hyperplasia)   . Chronic constipation   . Chronic restrictive lung disease   . Complication of anesthesia    limited positioning  of neck due to arthritis- from side to side and  backwards  . COPD mixed type ([place])   . Coronary artery disease CARDIOLOGIST- DR Thompson Grayer   STRESS TEST 08-17-10 IN EPIC (LOW RISK ,  NO ISCHEMIA)  . Dyspnea    walking distance   . GERD (gastroesophageal reflux disease)   . History of basal cell carcinoma excision BILATERAL ARMS  . History of colon polyps 2004   TA  . Hx of thyroid cancer    s/p total thyroidectomy,  . Hyperlipidemia   . Hypertension   . Hypothyroidism   . Hypothyroidism, postsurgical   . Iron deficiency anemia   . Nocturia   . On home oxygen therapy   . Right bundle branch block   . Sigmoid diverticulosis   . Wears glasses   . Wears glasses     Tobacco History: Social History   Tobacco Use  Smoking Status Former Smoker  . Packs/day: 2.00  . Years: 35.00  . Pack years: 70.00  . Types: Cigarettes  . Last attempt to quit: 01/11/1982  . Years since quitting: 35.3  Smokeless Tobacco Never Used   Counseling given: Not Answered   Outpatient Encounter Medications as of 05/27/2017  Medication Sig  . atorvastatin (LIPITOR) 20 MG tablet Take 20 mg by mouth daily after breakfast.   . ciprofloxacin (CIPRO) 500 MG tablet Take 1 tablet (500 mg total) by mouth at bedtime.  . iron polysaccharides (NIFEREX) 150 MG capsule Take 150 mg by mouth  daily.  . levothyroxine (SYNTHROID, LEVOTHROID) 175 MCG tablet Take 175 mcg by mouth daily after breakfast.   . nitroGLYCERIN (NITRODUR - DOSED IN MG/24 HR) 0.1 mg/hr patch Place 0.1 mg onto the skin daily.  . nitroGLYCERIN (NITROSTAT) 0.4 MG SL tablet Place 1 tablet (0.4 mg total) under the tongue every 5 (five) minutes as needed for chest pain.  . pantoprazole (PROTONIX) 40 MG tablet Take 1 tablet (40 mg total) by mouth daily. (Patient taking differently: Take 40 mg by mouth daily after breakfast. )  . polyethylene glycol powder (GLYCOLAX/MIRALAX) powder Take 17 g by mouth daily as needed (for constipation).  [name] testosterone enanthate (DELATESTRYL) 200 MG/ML injection Inject  200 mg into the muscle every 28 (twenty-eight) days. For IM use only  . Tiotropium Bromide-Olodaterol (STIOLTO RESPIMAT) 2.5-2.5 MCG/ACT AERS Inhale 2 puffs into the lungs daily.  [name] triamterene-hydrochlorothiazide (MAXZIDE-25) 37.5-25 MG tablet Take 1 tablet by mouth daily.  . vitamin B-12 (CYANOCOBALAMIN) 1000 MCG tablet Take 1,000 mcg by mouth 3 (three) times a week.  . Vitamin D, Ergocalciferol, (DRISDOL) 50000 units CAPS capsule Take 50,000 Units by mouth every Monday.   . cefUROXime (CEFTIN) 500 MG tablet Take 1 tablet (500 mg total) by mouth 2 (two) times daily with a meal.  . predniSONE (DELTASONE) 10 MG tablet Take 40mg  x1 day, 30mg  x1day 20 x1 day, 10mg  x1 day , 5mg  x 1 day   No facility-administered encounter medications on file as of 05/27/2017.      Review of Systems  Constitutional:   +feels hot, but no fevers, chills, fatigue No  weight loss, night sweats,  Fevers  HEENT:   No headaches,  Difficulty swallowing,  Tooth/dental problems, or  Sore throat, No sneezing, itching, ear ache, nasal congestion, post nasal drip,   CV:  No chest pain,  Orthopnea, PND, swelling in lower extremities, anasarca, dizziness, palpitations, syncope.   GI:  No heartburn, indigestion, abdominal pain, nausea, vomiting, diarrhea, change in bowel habits, loss of appetite, bloody stools.   Resp: +Productive cough with tan-white mucus, increasing shortness of breath, No coughing up of blood.  No change in color of mucus.  No wheezing.  No chest wall deformity  Skin: no rash or lesions.  GU: + Recent urinary/letter procedure, catheter removed today no dysuria, change in color of urine, no urgency or frequency.  No flank pain, no hematuria   MS:  No joint pain or swelling.  No decreased range of motion.  No back pain.    Physical Exam  BP (!) 150/78 (BP Location: Left Arm, Cuff Size: Normal)   Pulse (!) 102   Temp 97.8 F (36.6 C) (Oral)   Ht 6\' 3"  (1.905 m)   Wt 188 lb 3.2 oz (85.4 kg)   SpO2  93%   BMI 23.52 kg/m   GEN: A/Ox3; pleasant, NAD, well nourished    HEENT:  [place]/AT,  EACs-clear, TMs-wnl, NOSE-clear, THROAT-clear, no lesions, no postnasal drip or exudate noted.   NECK:  Supple w/ fair ROM; no JVD; normal carotid impulses w/o bruits; no thyromegaly or nodules palpated; no lymphadenopathy.    RESP  +bilateral velcro crackles in bottom 2/3 of lungs, no accessory muscle use, no dullness to percussion  CARD:  RRR, no m/r/g, no peripheral edema, pulses intact, no cyanosis or clubbing.  GI:   Soft & nt; nml bowel sounds; no organomegaly or masses detected.   Musco: Warm bil, no deformities or joint swelling noted.   Neuro: alert, no focal  deficits noted.    Skin: Warm, no lesions or rashes    Lab Results:  CBC    Component Value Date/Time   WBC 9.8 05/09/2017 1144   RBC 4.95 05/09/2017 1144   HGB 12.3 (L) 05/09/2017 1144   HCT 39.3 05/09/2017 1144   PLT 471.0 (H) 05/09/2017 1144   MCV 79.4 05/09/2017 1144   MCH 22.0 (L) 01/31/2017 0938   MCHC 31.2 05/09/2017 1144   RDW 20.4 (H) 05/09/2017 1144   LYMPHSABS 0.6 (L) 05/09/2017 1144   MONOABS 1.2 (H) 05/09/2017 1144   EOSABS 0.1 05/09/2017 1144   BASOSABS 0.1 05/09/2017 1144    BMET    Component Value Date/Time   NA 140 05/18/2017 1443   K 4.8 05/18/2017 1443   CL 102 05/18/2017 1443   CO2 30 05/18/2017 1443   GLUCOSE 109 (H) 05/18/2017 1443   BUN 24 (H) 05/18/2017 1443   CREATININE 1.21 05/18/2017 1443   CALCIUM 9.5 05/18/2017 1443   GFRNONAA 51 (L) 05/18/2017 1443   GFRAA 59 (L) 05/18/2017 1443    BNP No results found for: BNP  ProBNP    Component Value Date/Time   PROBNP 59.0 05/09/2017 1144    Imaging: Dg Chest 2 View  Result Date: 05/09/2017 CLINICAL DATA:  Shortness of breath with exertion. COPD, hypertension. EXAM: CHEST - 2 VIEW COMPARISON:  01/31/2017 FINDINGS: Hyperinflation of the lungs compatible with COPD. Stable. Stable areas of scarring in the lungs. No acute confluent  airspace opacities or effusions. Heart is normal size. Aortic calcifications/atherosclerosis, most notable in the aortic arch. No acute bony abnormality. IMPRESSION: COPD/chronic changes.  No active disease. Electronically Signed   By: Rolm Baptise M.D.   On: 05/09/2017 11:58     Assessment & Plan:   COPD GOLD II Ceftin 500 mg tablet take 1 tablet by mouth twice a day for the next 5 days. >>>Take with meals >>>Eat yogurt or start a probiotic to do the multiple antibiotics you are on   Prednisone (10mg  tablet) >>> Take 40mg  x1 day (4 tablets), 30mg  x1day (3 tablets), 20 x1 day (2 tablets), 10mg  x1 day (1 tablet), 5mg  x 1 day (0.5mg  tablet)  06/01/2017- high - resolution - CT >>>already scheduled   >>>06/01/2017  9:15 AM at [place]  Please contact the office if your respiratory symptoms get worse.  If you start coughing up colored mucus, blood-streaked mucus, or feel like you are having worsening shortness of breath.  Shortness of breath Ceftin 500 mg tablet take 1 tablet by mouth twice a day for the next 5 days. >>>Take with meals >>>Eat yogurt or start a probiotic to do the multiple antibiotics you are on   Prednisone (10mg  tablet) >>> Take 40mg  x1 day (4 tablets), 30mg  x1day (3 tablets), 20 x1 day (2 tablets), 10mg  x1 day (1 tablet), 5mg  x 1 day (0.5mg  tablet)  06/01/2017- high - resolution - CT >>>already scheduled   >>>06/01/2017  9:15 AM at [place]  Please contact the office if your respiratory symptoms get worse.  If you start coughing up colored mucus, blood-streaked mucus, or feel like you are having worsening shortness of breath.     Lauraine Rinne, NP 05/27/2017

## 2017-05-27 NOTE — Assessment & Plan Note (Signed)
Ceftin 500 mg tablet take 1 tablet by mouth twice a day for the next 5 days. >>>Take with meals >>>Eat yogurt or start a probiotic to do the multiple antibiotics you are on   Prednisone (10mg  tablet) >>> Take 40mg  x1 day (4 tablets), 30mg  x1day (3 tablets), 20 x1 day (2 tablets), 10mg  x1 day (1 tablet), 5mg  x 1 day (0.5mg  tablet)  06/01/2017- high - resolution - CT >>>already scheduled   >>>06/01/2017  9:15 AM at [place]  Please contact the office if your respiratory symptoms get worse.  If you start coughing up colored mucus, blood-streaked mucus, or feel like you are having worsening shortness of breath.

## 2017-05-27 NOTE — Telephone Encounter (Signed)
WE might have to swtich him to levaquin but this can cause tendonitis and also prolong Qtc. But that might be best choice because he grew pseudomonas in urine before and leavquin high dose can covder that and lungs. Unless urologist feels something like cephalexin might be fine  I tried to call [facility] but long hold I paged Dr Junious Silk - waiting x 30 min no response  Als question if he needs steroids  Can he come and see Tammy/Brian this aternoon? Probably better. I will be gone from 13.40 - 15.10 and cannot ansswer messages   Dr. Brand Males, M.D., [facility].C.P Pulmonary and Critical Care Medicine Staff Physician, [place] Director - Interstitial Lung Disease  Program  Pulmonary [place] at [place], [place], [address]  Pager: [phone], If no answer or between  15:00h - 7:00h: call [phone] Telephone: [phone]

## 2017-05-27 NOTE — Telephone Encounter (Signed)
How long is cipro for? Start date of cirpro? Who is urologist?  I like to talk to urology before I make changes Please ge tme answers before noon  Dr. Brand Males, M.D., [facility].C.P Pulmonary and Critical Care Medicine Staff Physician, [place] Director - Interstitial Lung Disease  Program  Pulmonary [place] at [place], [place], [address]  Pager: [phone], If no answer or between  15:00h - 7:00h: call [phone] Telephone: [phone]

## 2017-05-27 NOTE — Patient Instructions (Addendum)
Ceftin 500 mg tablet take 1 tablet by mouth twice a day for the next 5 days. >>>Take with meals >>>Eat yogurt or start a probiotic to do the multiple antibiotics you are on   Prednisone (10mg  tablet) >>> Take 40mg  x1 day (4 tablets), 30mg  x1day (3 tablets), 20 x1 day (2 tablets), 10mg  x1 day (1 tablet), 5mg  x 1 day (0.5mg  tablet)  06/01/2017- high - resolution - CT >>>already scheduled   >>>06/01/2017  9:15 AM at [place]  Please contact the office if your respiratory symptoms get worse.  If you start coughing up colored mucus, blood-streaked mucus, or feel like you are having worsening shortness of breath.  Please contact the office if your symptoms worsen or you have concerns that you are not improving.   Thank you for choosing [facility] for your healthcare, and for allowing [place] to partner with you on your healthcare journey. I am thankful to be able to provide care to you today.   Wyn Quaker FNP-C

## 2017-05-27 NOTE — Telephone Encounter (Signed)
Spoke with Dr. Chase Caller on the phone. He has changed his plan. He wants the patient to discontinue Cipro and start Ceftin 500mg  twice daily for 5 days. He also wants him to start prednisone 40mg  x1 day, 30mg  x1 day, 20mg  x1 day, 10mg  x1 and 5mg  x1 day.   Also, the appt with Aaron Edelman this afternoon is not necessary unless he feels the need to come in.   Left messages on both the home and mobile number listed. Will need to call in medication once [facility] or patient calls back.

## 2017-05-30 NOTE — Telephone Encounter (Signed)
Spoke with pt, and he stated he saw Jose Lawson on Friday so nothing else is needed. He has a CT scheduled on 5/22 and I advised him that would call him with those results. Will close this encounter.

## 2017-05-31 DIAGNOSIS — C678 Malignant neoplasm of overlapping sites of bladder: Secondary | ICD-10-CM | POA: Diagnosis not present

## 2017-06-01 ENCOUNTER — Encounter (HOSPITAL_COMMUNITY): Payer: Self-pay

## 2017-06-01 ENCOUNTER — Ambulatory Visit (INDEPENDENT_AMBULATORY_CARE_PROVIDER_SITE_OTHER)
Admission: RE | Admit: 2017-06-01 | Discharge: 2017-06-01 | Disposition: A | Discharge status: Home / Self Care | Payer: Medicare Other | Source: Ambulatory Visit | Attending: Internal Medicine | Admitting: Internal Medicine

## 2017-06-01 DIAGNOSIS — J439 Emphysema, unspecified: Secondary | ICD-10-CM | POA: Diagnosis not present

## 2017-06-01 DIAGNOSIS — R0602 Shortness of breath: Secondary | ICD-10-CM | POA: Diagnosis not present

## 2017-06-02 ENCOUNTER — Encounter: Payer: Self-pay | Admitting: Internal Medicine

## 2017-06-02 ENCOUNTER — Encounter (HOSPITAL_COMMUNITY): Payer: Self-pay

## 2017-06-02 ENCOUNTER — Ambulatory Visit: Payer: Medicare Other | Admitting: Internal Medicine

## 2017-06-02 VITALS — BP 140/76 | HR 80 | Ht 75.0 in | Wt 184.8 lb

## 2017-06-02 DIAGNOSIS — J9611 Chronic respiratory failure with hypoxia: Secondary | ICD-10-CM | POA: Diagnosis not present

## 2017-06-02 DIAGNOSIS — G4733 Obstructive sleep apnea (adult) (pediatric): Secondary | ICD-10-CM

## 2017-06-02 DIAGNOSIS — J449 Chronic obstructive pulmonary disease, unspecified: Secondary | ICD-10-CM | POA: Diagnosis not present

## 2017-06-02 DIAGNOSIS — J209 Acute bronchitis, unspecified: Secondary | ICD-10-CM

## 2017-06-02 MED ORDER — LEVALBUTEROL HCL 0.63 MG/3ML IN NEBU
0.6300 mg | INHALATION_SOLUTION | Freq: Once | RESPIRATORY_TRACT | Status: AC
  Administered 2017-06-02: 0.63 mg via RESPIRATORY_TRACT

## 2017-06-02 NOTE — Patient Instructions (Addendum)
We are going to continue just with oxygen as you are doing. I don't think CPAP will make enough difference now for your mild sleep apnea, to be worth bothering with unless your cardiologist wants it.  For the head congestion we suggested Sudafed 6-12 hour form, from Pharmacy counter.  Suggest otc Delsym cough syrup and Mucinex to help manage the cough and chest congestion.  Order- office f/u appointment with Dr Chase Caller for general pulmonary follow- up in 1-2 months  Order- neb xop 0.63     Dx acute bronchitis

## 2017-06-02 NOTE — Progress Notes (Signed)
03/02/17-84 year old male former smoker followed at this office for Pulmonary-   COPD GOLD II and Pulmonary HTN  Complicated by thyroid and Bladder Cancer  Sleep Consult: Per MR. Pt had sleep study and CPAP titration. Order was placed for CPAP but patient wanted to speak with MD today before setting up.  O2 1 L sleep and exertion HST 11/25/16-AHI 27.3/hour, desaturation to 71%, body weight 199 pounds Sleep study apparently ordered because of concerns about oxygen status.  Wife says he only snores some when on his back without witnessed apneas. He does not recognize a lot of daytime sleepiness. ENT surgery limited to tonsils.  He has been wearing oxygen 1 L for sleep Breathing currently is at baseline with little cough or wheeze. Walk test on room air 02/25/17-room air nadir 84% with maximum heart rate 104.  06/02/2017- 84 year old male former smoker followed at this office for Pulmonary-   COPD GOLD II and Pulmonary HTN, Complicated by thyroid and Bladder Cancer HST 04/06/17-AHI 10.9/hour, desaturation to 72%, body weight 191 pounds, 41.5 minutes recorded with saturation less than 89% while sleeping. Home O2 2 L sleep, Simply-[employer] mini POC pulse 2 L Wife again says he does not snore significantly.  We discussed management of mild OSA.  He is comfortable with oxygen and does not want CPAP.  This is acceptable for scores in his range. Acute bronchitis exacerbation-caught chest cold from wife 7 days ago-clear sputum, deep cough, no fever.  Recent Cipro then cephalosporin after GU surgery. CT chest hi res 06/01/2017 IMPRESSION: 1. Negative for interstitial lung disease. 2.  Emphysema (ICD10-J43.9). 3. Aortic atherosclerosis (ICD10-170.0). Coronary artery calcification. 4. Enlarged pulmonary arteries, indicative of arterial hypertension.  ROS-see HPI   + = positive Constitutional:    weight loss, night sweats, fevers, chills, fatigue, lassitude. HEENT:    headaches, difficulty swallowing, tooth/dental  problems, sore throat,       sneezing, itching, ear ache, nasal congestion, post nasal drip, snoring CV:    chest pain, orthopnea, PND, swelling in lower extremities, anasarca,                                  dizziness, palpitations Resp:   +shortness of breath with exertion or at rest.                productive cough,   non-productive cough, coughing up of blood.              change in color of mucus.  wheezing.   Skin:    rash or lesions. GI:  No-   heartburn, indigestion, abdominal pain, nausea, vomiting, diarrhea,                 change in bowel habits, loss of appetite GU: dysuria, change in color of urine, no urgency or frequency.   flank pain. MS:   joint pain, stiffness, decreased range of motion, back pain. Neuro-     nothing unusual Psych:  change in mood or affect.  depression or anxiety.   memory loss.  OBJ- Physical Exam General- Alert, Oriented, Affect-appropriate, Distress- none acute,+ tall thin man, + O2 Skin- rash-none, lesions- none, excoriation- none Lymphadenopathy- none Head- atraumatic            Eyes- Gross vision intact, PERRLA, conjunctivae and secretions clear            Ears- Hearing, canals-normal  Nose- Clear, no-Septal dev, mucus, polyps, erosion, perforation             Throat- Mallampati II-III , mucosa clear , drainage- none, tonsils- atrophic Neck- flexible , trachea midline, no stridor , thyroid nl, carotid no bruit Chest - symmetrical excursion , unlabored           Heart/CV- RRR , no murmur , no gallop  , no rub, nl s1 s2                           - JVD- none , edema- none, stasis changes- none, varices- none           Lung-  +Coarse breath sounds R>L mid back, wheeze- none, cough+ light ,                       dullness-none, rub- none           Chest wall-  Abd-  Br/ Gen/ Rectal- Not done, not indicated Extrem- cyanosis- none, clubbing, none, atrophy- none, strength- nl Neuro- grossly intact to observation

## 2017-06-02 NOTE — Assessment & Plan Note (Signed)
Acute bronchitis exacerbation, likely a viral infection cough from wife. Plan-okay to use Sudafed as discussed for head congestion. Neb xopenex today, Mucinex DM or Delsym. F/U with Dr Chase Caller as planned.

## 2017-06-02 NOTE — Assessment & Plan Note (Signed)
He remains oxygen dependent and is compliant, wearing POC at this visit.

## 2017-06-02 NOTE — Assessment & Plan Note (Signed)
It is within standard of care not to treat mild OSA with active intervention.  His wife says he does not snore significantly and he does not want CPAP. Plan-maintain normal body weight, sleep off a lot of back and continue to wear oxygen during sleep.

## 2017-06-07 ENCOUNTER — Other Ambulatory Visit: Payer: Self-pay

## 2017-06-07 ENCOUNTER — Encounter (HOSPITAL_COMMUNITY): Payer: Self-pay

## 2017-06-07 DIAGNOSIS — D485 Neoplasm of uncertain behavior of skin: Secondary | ICD-10-CM | POA: Diagnosis not present

## 2017-06-07 DIAGNOSIS — C44319 Basal cell carcinoma of skin of other parts of face: Secondary | ICD-10-CM | POA: Diagnosis not present

## 2017-06-07 DIAGNOSIS — Z85828 Personal history of other malignant neoplasm of skin: Secondary | ICD-10-CM | POA: Diagnosis not present

## 2017-06-07 DIAGNOSIS — C44622 Squamous cell carcinoma of skin of right upper limb, including shoulder: Secondary | ICD-10-CM | POA: Diagnosis not present

## 2017-06-07 DIAGNOSIS — L570 Actinic keratosis: Secondary | ICD-10-CM | POA: Diagnosis not present

## 2017-06-07 DIAGNOSIS — L565 Disseminated superficial actinic porokeratosis (DSAP): Secondary | ICD-10-CM | POA: Diagnosis not present

## 2017-06-08 ENCOUNTER — Other Ambulatory Visit: Payer: Self-pay

## 2017-06-08 ENCOUNTER — Ambulatory Visit (AMBULATORY_SURGERY_CENTER): Payer: Self-pay | Admitting: *Deleted

## 2017-06-08 VITALS — Ht 75.0 in | Wt 185.0 lb

## 2017-06-08 DIAGNOSIS — D509 Iron deficiency anemia, unspecified: Secondary | ICD-10-CM

## 2017-06-08 MED ORDER — NA SULFATE-K SULFATE-MG SULF 17.5-3.13-1.6 GM/177ML PO SOLN: 1.0000 | Freq: Once | ORAL | 0 refills | Status: AC

## 2017-06-08 NOTE — Progress Notes (Signed)
No egg or soy allergy known to patient  No issues with past sedation with any surgeries  or procedures, no intubation problems  No diet pills per patient No home 02 use per patient  No blood thinners per patient  Pt denies issues with constipation now- uses Miralax prn   No A fib or A flutter  EMMI video sent to pt's e mail  [facility] patient due to use of 02 and limited positioning of neck  Pt uses 02 2 liters at night and during the day with activity  Wife in PV with pt today  Pt given a Pay No More than $50 coupon for [employer]

## 2017-06-09 DIAGNOSIS — E291 Testicular hypofunction: Secondary | ICD-10-CM | POA: Diagnosis not present

## 2017-06-11 DIAGNOSIS — R0602 Shortness of breath: Secondary | ICD-10-CM | POA: Diagnosis not present

## 2017-06-11 DIAGNOSIS — J449 Chronic obstructive pulmonary disease, unspecified: Secondary | ICD-10-CM | POA: Diagnosis not present

## 2017-06-11 DIAGNOSIS — N179 Acute kidney failure, unspecified: Secondary | ICD-10-CM | POA: Diagnosis not present

## 2017-06-13 ENCOUNTER — Ambulatory Visit (HOSPITAL_COMMUNITY): Payer: Medicare Other | Admitting: Anesthesiology

## 2017-06-13 ENCOUNTER — Encounter (HOSPITAL_COMMUNITY): Payer: Self-pay | Admitting: *Deleted

## 2017-06-13 ENCOUNTER — Encounter (HOSPITAL_COMMUNITY)
Admission: RE | Disposition: A | Discharge status: Home / Self Care | Payer: Self-pay | Source: Ambulatory Visit | Attending: Gastroenterology

## 2017-06-13 ENCOUNTER — Other Ambulatory Visit: Payer: Self-pay

## 2017-06-13 ENCOUNTER — Ambulatory Visit (HOSPITAL_COMMUNITY)
Admission: RE | Admit: 2017-06-13 | Discharge: 2017-06-13 | Disposition: A | Discharge status: Home / Self Care | Payer: Medicare Other | Source: Ambulatory Visit | Attending: Gastroenterology | Admitting: Gastroenterology

## 2017-06-13 DIAGNOSIS — I452 Bifascicular block: Secondary | ICD-10-CM | POA: Diagnosis not present

## 2017-06-13 DIAGNOSIS — G4730 Sleep apnea, unspecified: Secondary | ICD-10-CM | POA: Insufficient documentation

## 2017-06-13 DIAGNOSIS — E785 Hyperlipidemia, unspecified: Secondary | ICD-10-CM | POA: Diagnosis not present

## 2017-06-13 DIAGNOSIS — K219 Gastro-esophageal reflux disease without esophagitis: Secondary | ICD-10-CM | POA: Diagnosis not present

## 2017-06-13 DIAGNOSIS — Z8551 Personal history of malignant neoplasm of bladder: Secondary | ICD-10-CM | POA: Insufficient documentation

## 2017-06-13 DIAGNOSIS — Z7982 Long term (current) use of aspirin: Secondary | ICD-10-CM | POA: Diagnosis not present

## 2017-06-13 DIAGNOSIS — D509 Iron deficiency anemia, unspecified: Secondary | ICD-10-CM

## 2017-06-13 DIAGNOSIS — Z79899 Other long term (current) drug therapy: Secondary | ICD-10-CM | POA: Insufficient documentation

## 2017-06-13 DIAGNOSIS — Z85850 Personal history of malignant neoplasm of thyroid: Secondary | ICD-10-CM | POA: Insufficient documentation

## 2017-06-13 DIAGNOSIS — Z87891 Personal history of nicotine dependence: Secondary | ICD-10-CM | POA: Insufficient documentation

## 2017-06-13 DIAGNOSIS — D124 Benign neoplasm of descending colon: Secondary | ICD-10-CM

## 2017-06-13 DIAGNOSIS — M479 Spondylosis, unspecified: Secondary | ICD-10-CM | POA: Insufficient documentation

## 2017-06-13 DIAGNOSIS — I2510 Atherosclerotic heart disease of native coronary artery without angina pectoris: Secondary | ICD-10-CM | POA: Diagnosis not present

## 2017-06-13 DIAGNOSIS — Z9981 Dependence on supplemental oxygen: Secondary | ICD-10-CM | POA: Insufficient documentation

## 2017-06-13 DIAGNOSIS — I10 Essential (primary) hypertension: Secondary | ICD-10-CM | POA: Diagnosis not present

## 2017-06-13 DIAGNOSIS — E890 Postprocedural hypothyroidism: Secondary | ICD-10-CM | POA: Diagnosis not present

## 2017-06-13 DIAGNOSIS — K640 First degree hemorrhoids: Secondary | ICD-10-CM | POA: Diagnosis not present

## 2017-06-13 DIAGNOSIS — J449 Chronic obstructive pulmonary disease, unspecified: Secondary | ICD-10-CM | POA: Diagnosis not present

## 2017-06-13 DIAGNOSIS — D122 Benign neoplasm of ascending colon: Secondary | ICD-10-CM | POA: Insufficient documentation

## 2017-06-13 DIAGNOSIS — Z85828 Personal history of other malignant neoplasm of skin: Secondary | ICD-10-CM | POA: Diagnosis not present

## 2017-06-13 DIAGNOSIS — Z86010 Personal history of colonic polyps: Secondary | ICD-10-CM | POA: Diagnosis not present

## 2017-06-13 DIAGNOSIS — D123 Benign neoplasm of transverse colon: Secondary | ICD-10-CM | POA: Diagnosis not present

## 2017-06-13 DIAGNOSIS — K31819 Angiodysplasia of stomach and duodenum without bleeding: Secondary | ICD-10-CM | POA: Diagnosis not present

## 2017-06-13 DIAGNOSIS — N400 Enlarged prostate without lower urinary tract symptoms: Secondary | ICD-10-CM | POA: Diagnosis not present

## 2017-06-13 HISTORY — PX: POLYPECTOMY: SHX5525

## 2017-06-13 HISTORY — PX: COLONOSCOPY WITH PROPOFOL: SHX5780

## 2017-06-13 HISTORY — PX: ESOPHAGOGASTRODUODENOSCOPY (EGD) WITH PROPOFOL: SHX5813

## 2017-06-13 HISTORY — PX: HOT HEMOSTASIS: SHX5433

## 2017-06-13 SURGERY — COLONOSCOPY WITH PROPOFOL
Anesthesia: Monitor Anesthesia Care

## 2017-06-13 MED ORDER — SODIUM CHLORIDE 0.9 % IV SOLN: INTRAVENOUS | Status: DC

## 2017-06-13 MED ORDER — LIDOCAINE 2% (20 MG/ML) 5 ML SYRINGE
INTRAMUSCULAR | Status: DC | PRN
  Administered 2017-06-13: 60 mg via INTRAVENOUS

## 2017-06-13 MED ORDER — PROPOFOL 500 MG/50ML IV EMUL
INTRAVENOUS | Status: DC | PRN
  Administered 2017-06-13: 150 ug/kg/min via INTRAVENOUS

## 2017-06-13 MED ORDER — PROPOFOL 10 MG/ML IV BOLUS
INTRAVENOUS | Status: AC
  Filled 2017-06-13: qty 20

## 2017-06-13 MED ORDER — LACTATED RINGERS IV SOLN
INTRAVENOUS | Status: DC
  Administered 2017-06-13: 08:00:00 via INTRAVENOUS

## 2017-06-13 MED ORDER — ONDANSETRON HCL 4 MG/2ML IJ SOLN
INTRAMUSCULAR | Status: DC | PRN
  Administered 2017-06-13: 4 mg via INTRAVENOUS

## 2017-06-13 MED ORDER — PROPOFOL 10 MG/ML IV BOLUS
INTRAVENOUS | Status: DC | PRN
  Administered 2017-06-13 (×2): 10 mg via INTRAVENOUS

## 2017-06-13 MED ORDER — PROPOFOL 10 MG/ML IV BOLUS
INTRAVENOUS | Status: AC
  Filled 2017-06-13: qty 40

## 2017-06-13 SURGICAL SUPPLY — 24 items

## 2017-06-13 NOTE — Anesthesia Postprocedure Evaluation (Signed)
Anesthesia Post Note  Patient: Jancarlo Biermann Genest Sr.  Procedure(s) Performed: COLONOSCOPY WITH PROPOFOL (N/A ) ESOPHAGOGASTRODUODENOSCOPY (EGD) WITH PROPOFOL (N/A ) POLYPECTOMY HOT HEMOSTASIS (ARGON PLASMA COAGULATION/BICAP) (N/A )     Patient location during evaluation: Endoscopy Anesthesia Type: MAC Level of consciousness: awake and alert, oriented and patient cooperative Pain management: pain level controlled Vital Signs Assessment: post-procedure vital signs reviewed and stable Respiratory status: spontaneous breathing, nonlabored ventilation and respiratory function stable Cardiovascular status: blood pressure returned to baseline and stable Postop Assessment: no apparent nausea or vomiting Anesthetic complications: no    Last Vitals:  Vitals:   06/13/17 1040 06/13/17 1045  BP: (!) 129/58   Pulse: 68 73  Resp: (!) 21 (!) 21  Temp:    SpO2: 100% 100%    Last Pain:  Vitals:   06/13/17 1050  TempSrc:   PainSc: 0-No pain                 Christine Schiefelbein,E. Dominico Rod

## 2017-06-13 NOTE — Anesthesia Preprocedure Evaluation (Addendum)
Anesthesia Evaluation  Patient identified by MRN, date of birth, ID band Patient awake    Reviewed: Allergy & Precautions, NPO status , Patient's Chart, lab work & pertinent test results  History of Anesthesia Complications Negative for: history of anesthetic complications  Airway Mallampati: II  TM Distance: >3 FB Neck ROM: Full    Dental  (+) Dental Advisory Given, Chipped   Pulmonary sleep apnea and Oxygen sleep apnea , COPD (nocturnal O2),  oxygen dependent, former smoker (quit 1984),    breath sounds clear to auscultation       Cardiovascular hypertension, Pt. on medications (-) angina Rhythm:Regular Rate:Normal  10/18 ECHO; EF 60-65%, valves OK   Neuro/Psych negative neurological ROS     GI/Hepatic Neg liver ROS, GERD  Medicated and Controlled,  Endo/Other  Hypothyroidism H/o thyroid cancer  Renal/GU Renal InsufficiencyRenal disease   H/o bladder cancer    Musculoskeletal   Abdominal   Peds  Hematology   Anesthesia Other Findings   Reproductive/Obstetrics                            Anesthesia Physical Anesthesia Plan  ASA: III  Anesthesia Plan: MAC   Post-op Pain Management:    Induction: Intravenous  PONV Risk Score and Plan: 1 and Treatment may vary due to age or medical condition and Ondansetron  Airway Management Planned: Natural Airway and Nasal Cannula  Additional Equipment:   Intra-op Plan:   Post-operative Plan:   Informed Consent: I have reviewed the patients History and Physical, chart, labs and discussed the procedure including the risks, benefits and alternatives for the proposed anesthesia with the patient or authorized representative who has indicated his/her understanding and acceptance.   Dental advisory given  Plan Discussed with: CRNA and Surgeon  Anesthesia Plan Comments: (Plan routine monitors, MAC)        Anesthesia Quick Evaluation

## 2017-06-13 NOTE — H&P (Addendum)
History of Present Illness: This is an 84 year old male referred by [name], MD for the evaluation of iron deficiency anemia.  He is accompanied by his wife. Hemoglobin In January Hb=8.1, iron saturation=5%, iron=18, ferritin=29.  He was started on iron once daily by Dr. Philip Aspen.  Patient notes intermittent mild constipation for several years treated with milk of magnesia as needed and the symptoms have not changed.  Dr. Shon Baton records state that he had a colonoscopy performed in 2010 with 2 colon polyps found. The colonoscopy and path records are not available to review today.  Patient relates that Dr. Amedeo Plenty performed his last colonoscopy. Denies weight loss, abdominal pain, diarrhea, change in stool caliber, melena, hematochezia, nausea, vomiting, dysphagia, reflux symptoms, chest pain.     No Known Allergies       Outpatient Medications Prior to Visit  Medication Sig Dispense Refill  . aspirin EC 81 MG tablet Take 81 mg by mouth daily.    [name] atorvastatin (LIPITOR) 20 MG tablet Take 20 mg by mouth daily after breakfast.     . doxycycline (ADOXA) 100 MG tablet Take 100 mg by mouth 2 (two) times daily.    . iron polysaccharides (NIFEREX) 150 MG capsule Take 150 mg by mouth daily.    [name] levothyroxine (SYNTHROID, LEVOTHROID) 175 MCG tablet Take 175 mcg by mouth daily after breakfast.     . nitroGLYCERIN (NITRODUR - DOSED IN MG/24 HR) 0.1 mg/hr patch Place 0.1 mg onto the skin daily.    . nitroGLYCERIN (NITROSTAT) 0.4 MG SL tablet Place 1 tablet (0.4 mg total) under the tongue every 5 (five) minutes as needed for chest pain. 30 tablet 12  . pantoprazole (PROTONIX) 40 MG tablet Take 1 tablet (40 mg total) by mouth daily. (Patient taking differently: Take 40 mg by mouth daily after breakfast. ) 60 tablet 0  . polyethylene glycol powder (GLYCOLAX/MIRALAX) powder Take 17 g by mouth daily as needed (for constipation).    [name] testosterone enanthate (DELATESTRYL) 200 MG/ML  injection Inject 200 mg into the muscle every 28 (twenty-eight) days. For IM use only    . Tiotropium Bromide-Olodaterol (STIOLTO RESPIMAT) 2.5-2.5 MCG/ACT AERS Inhale 2 puffs into the lungs daily. 1 Inhaler 5  . triamterene-hydrochlorothiazide (MAXZIDE-25) 37.5-25 MG tablet Take 1 tablet by mouth daily.  0  . vitamin B-12 (CYANOCOBALAMIN) 1000 MCG tablet Take 1,000 mcg by mouth 3 (three) times a week.    . Vitamin D, Ergocalciferol, (DRISDOL) 50000 units CAPS capsule Take 50,000 Units by mouth every Monday.      No facility-administered medications prior to visit.        Past Medical History:  Diagnosis Date  . Arthritis    BACK  . Bifascicular block   . Bladder cancer ([place])   . BPH (benign prostatic hyperplasia)   . Chronic constipation   . Chronic restrictive lung disease   . COPD mixed type ([place])   . Coronary artery disease CARDIOLOGIST- DR Thompson Grayer   STRESS TEST 08-17-10 IN EPIC (LOW RISK ,  NO ISCHEMIA)  . GERD (gastroesophageal reflux disease)   . History of basal cell carcinoma excision BILATERAL ARMS  . History of colon polyps 2004   TA  . Hx of thyroid cancer    s/p total thyroidectomy,  . Hyperlipidemia   . Hypertension   . Hypothyroidism   . Hypothyroidism, postsurgical   . Iron deficiency anemia   . Nocturia   . Right bundle branch block   . Sigmoid  diverticulosis   . Wears glasses   . Wears glasses         Past Surgical History:  Procedure Laterality Date  . CARDIOVASCULAR STRESS TEST  08-17-2010   dr allred   Low risk nuclear study with prominent inferobasal thinning vs small prior infarct, no ischemia/  normal LV function and wall motion , ef 63%  . CATARACT EXTRACTION W/ INTRAOCULAR LENS  IMPLANT, BILATERAL    . CYSTOSCOPY W/ RETROGRADES Bilateral 10/08/2014   Procedure: CYSTOSCOPY WITH BILATERAL RETROGRADE PYELOGRAM, BIOPSY, FULGURATION;  Surgeon: Festus Aloe, MD;  Location: [facility];   Service: Urology;  Laterality: Bilateral;  . CYSTOSCOPY W/ RETROGRADES Bilateral 06/18/2016   Procedure: CYSTOSCOPY WITH RETROGRADE PYELOGRAM;  Surgeon: Festus Aloe, MD;  Location: [facility];  Service: Urology;  Laterality: Bilateral;  . CYSTOSCOPY WITH BIOPSY  12/16/2010   Procedure: CYSTOSCOPY WITH BIOPSY;  Surgeon: Molli Hazard, MD;  Location: [facility];  Service: Urology;  Laterality: N/A;  . CYSTOSCOPY WITH BIOPSY N/A 05/20/2015   Procedure: CYSTOSCOPY WITH BLADDER BIOPSY AND FULGERATION, CYSTOGRAM, BILATERAL RETROGRADES, BILATERAL RENAL WASHINGS;  Surgeon: Festus Aloe, MD;  Location: [facility];  Service: Urology;  Laterality: N/A;  . CYSTOSCOPY WITH BIOPSY N/A 06/18/2016   Procedure: CYSTOSCOPY WITH BIOPSY FULGURATION;  Surgeon: Festus Aloe, MD;  Location: [facility];  Service: Urology;  Laterality: N/A;  . CYSTOSCOPY/RETROGRADE/URETEROSCOPY  12/16/2010   Procedure: CYSTOSCOPY/RETROGRADE/URETEROSCOPY;  Surgeon: Molli Hazard, MD;  Location: [facility];  Service: Urology;  Laterality: Right;  bilateral retrograde, right ureteroscopy  . EYE SURGERY     attached lens to iris  . GREEN LIGHT LASER TURP (TRANSURETHRAL RESECTION OF PROSTATE  11-02-2011  . KNEE ARTHROSCOPY Left 11/  2016  . TOTAL THYROIDECTOMY  1991  . TRANSURETHRAL RESECTION OF BLADDER TUMOR  12/16/2010   Procedure: TRANSURETHRAL RESECTION OF BLADDER TUMOR (TURBT);  Surgeon: Molli Hazard, MD;  Location: [facility];  Service: Urology;  Laterality: N/A;   Social History        Socioeconomic History  . Marital status: Married    Spouse name: None  . Number of children: None  . Years of education: None  . Highest education level: None  Social Needs  . Financial resource strain: None  . Food insecurity - worry: None  . Food insecurity - inability: None  . Transportation needs - medical: None  . Transportation  needs - non-medical: None  Occupational History  . Occupation: retired  Tobacco Use  . Smoking status: Former Smoker    Packs/day: 2.00    Years: 35.00    Pack years: 70.00    Types: Cigarettes    Last attempt to quit: 01/11/1982    Years since quitting: 35.2  . Smokeless tobacco: Never Used  Substance and Sexual Activity  . Alcohol use: Yes    Alcohol/week: 0.0 oz    Comment: 6-oz vodka per day   . Drug use: No  . Sexual activity: No  Other Topics Concern  . None  Social History Narrative   Lives in [place] with his spouse.   Retired from [employer].        Family History  Problem Relation Age of Onset  . Lung cancer Brother   . Thyroid cancer Unknown   . Diabetes Father       Review of Systems: Pertinent positive and negative review of systems were noted in the above HPI section. All other review of  systems were otherwise negative.   Physical Exam: General: Well developed, well nourished, no acute distress Head: Normocephalic and atraumatic Eyes:  sclerae anicteric, EOMI Ears: Normal auditory acuity Mouth: No deformity or lesions Neck: Supple, no masses or thyromegaly Lungs: Clear throughout to auscultation Heart: Regular rate and rhythm; no murmurs, rubs or bruits Abdomen: Soft, non tender and non distended. No masses, hepatosplenomegaly or hernias noted. Normal Bowel sounds Musculoskeletal: Symmetrical with no gross deformities  Skin: No lesions on visible extremities Rectal: Deferred to colonoscopy Pulses:  Normal pulses noted Extremities: No clubbing, cyanosis, edema or deformities noted Neurological: Alert oriented x 4, grossly nonfocal Cervical Nodes:  No significant cervical adenopathy Inguinal Nodes: No significant inguinal adenopathy Psychological:  Alert and cooperative. Normal mood and affect  Assessment and Recommendations:  1.  Iron deficiency anemia.  Rule out gastrointestinal sources of blood loss due to colorectal  neoplasms, AVMs, ulcers, etc. Continue iron once daily. Recommended proceeding with colonoscopy and EGD.  He states that he will consider colonoscopy and EGD after his recovery from carpal tunnel surgeries.  Attempt to obtain records from prior colonoscopy. The risks (including bleeding, perforation, infection, missed lesions, medication reactions and possible hospitalization or surgery if complications occur), benefits, and alternatives to colonoscopy with possible biopsy and possible polypectomy were discussed with the patient and they consent to proceed. The risks (including bleeding, perforation, infection, missed lesions, medication reactions and possible hospitalization or surgery if complications occur), benefits, and alternatives to endoscopy with possible biopsy and possible dilation were discussed with the patient and they consent to proceed.

## 2017-06-13 NOTE — Op Note (Signed)
[facility] Patient Name: Jose Lawson Procedure Date: 06/13/2017 MRN: [id number] Attending MD: Ladene Artist , MD Date of Birth: 1925-10-14 CSN: [id number] Age: 84 Admit Type: Outpatient Procedure:                Colonoscopy Indications:              Iron deficiency anemia Providers:                Pricilla Riffle. Fuller Plan, MD, Presley Raddle, RN, Cherylynn Ridges, Technician, Virgia Land, CRNA Referring MD:             Ermalene Searing. Philip Aspen, MD Medicines:                Monitored Anesthesia Care Complications:            No immediate complications. Estimated blood loss:                            None. Estimated Blood Loss:     Estimated blood loss: none. Procedure:                Pre-Anesthesia Assessment:                           - Prior to the procedure, a History and Physical                            was performed, and patient medications and                            allergies were reviewed. The patient's tolerance of                            previous anesthesia was also reviewed. The risks                            and benefits of the procedure and the sedation                            options and risks were discussed with the patient.                            All questions were answered, and informed consent                            was obtained. Prior Anticoagulants: The patient has                            taken no previous anticoagulant or antiplatelet                            agents. ASA Grade Assessment: III - A patient with  severe systemic disease. After reviewing the risks                            and benefits, the patient was deemed in                            satisfactory condition to undergo the procedure.                           After obtaining informed consent, the colonoscope                            was passed under direct vision. Throughout the   procedure, the patient's blood pressure, pulse, and                            oxygen saturations were monitored continuously. The                            was introduced through the anus and advanced to the                            the cecum, identified by appendiceal orifice and                            ileocecal valve. The ileocecal valve, appendiceal                            orifice, and rectum were photographed. The quality                            of the bowel preparation was adequate to identify                            polyps 6 mm and larger in size after extensive                            lavage and suctioning. The colonoscopy was                            performed without difficulty. The patient tolerated                            the procedure well. Scope In: 9:32:45 AM Scope Out: 10:01:26 AM Scope Withdrawal Time: 0 hours 17 minutes 31 seconds  Total Procedure Duration: 0 hours 28 minutes 41 seconds  Findings:      The perianal and digital rectal examinations were normal.      Six sessile polyps were found in the descending colon (2), transverse       colon (4) and ascending colon (1). The polyps were 6 to 8 mm in size.       These polyps were removed with a cold snare. Resection and retrieval       were complete.      Internal hemorrhoids were found during  retroflexion. The hemorrhoids       were small and Grade I (internal hemorrhoids that do not prolapse).      The exam was otherwise without abnormality on direct and retroflexion       views. Impression:               - Six 6 to 8 mm polyps in the descending colon, in                            the transverse colon and in the ascending colon,                            removed with a cold snare. Resected and retrieved.                           - Internal hemorrhoids.                           - The examination was otherwise normal on direct                            and retroflexion views. Moderate  Sedation:      N/A- Per Anesthesia Care Recommendation:           - Patient has a contact number available for                            emergencies. The signs and symptoms of potential                            delayed complications were discussed with the                            patient. Return to normal activities tomorrow.                            Written discharge instructions were provided to the                            patient.                           - Resume previous diet.                           - Continue present medications.                           - Await pathology results.                           - No repeat colonoscopy due to age. Procedure Code(s):        --- Professional ---                           [phone], Colonoscopy, flexible; with removal of  tumor(s), polyp(s), or other lesion(s) by snare                            technique Diagnosis Code(s):        --- Professional ---                           D12.4, Benign neoplasm of descending colon                           D12.3, Benign neoplasm of transverse colon (hepatic                            flexure or splenic flexure)                           D12.2, Benign neoplasm of ascending colon                           K64.0, First degree hemorrhoids                           D50.9, Iron deficiency anemia, unspecified CPT copyright 2017 American Medical Association. All rights reserved. The codes documented in this report are preliminary and upon coder review may  be revised to meet current compliance requirements. Ladene Artist, MD 06/13/2017 10:05:18 AM This report has been signed electronically. Number of Addenda: 0

## 2017-06-13 NOTE — Transfer of Care (Signed)
Immediate Anesthesia Transfer of Care Note  Patient: Jose Tsuchiya Dufresne Sr.  Procedure(s) Performed: COLONOSCOPY WITH PROPOFOL (N/A ) ESOPHAGOGASTRODUODENOSCOPY (EGD) WITH PROPOFOL (N/A ) POLYPECTOMY  Patient Location: PACU  Anesthesia Type:MAC  Level of Consciousness: awake, alert  and oriented  Airway & Oxygen Therapy: Patient Spontanous Breathing and Patient connected to nasal cannula oxygen  Post-op Assessment: Report given to RN and Post -op Vital signs reviewed and stable  Post vital signs: Reviewed and stable  Last Vitals:  Vitals Value Taken Time  BP 110/50 06/13/2017 10:27 AM  Temp    Pulse 73 06/13/2017 10:28 AM  Resp 11 06/13/2017 10:28 AM  SpO2 100 % 06/13/2017 10:28 AM  Vitals shown include unvalidated device data.  Last Pain:  Vitals:   06/13/17 0810  PainSc: 0-No pain         Complications: No apparent anesthesia complications

## 2017-06-13 NOTE — Discharge Instructions (Signed)
YOU HAD AN ENDOSCOPIC PROCEDURE TODAY: Refer to the procedure report and other information in the discharge instructions given to you for any specific questions about what was found during the examination. If this information does not answer your questions, please call [place] office at [phone] to clarify.  ° °YOU SHOULD EXPECT: Some feelings of bloating in the abdomen. Passage of more gas than usual. Walking can help get rid of the air that was put into your GI tract during the procedure and reduce the bloating. If you had a lower endoscopy (such as a colonoscopy or flexible sigmoidoscopy) you may notice spotting of blood in your stool or on the toilet paper. Some abdominal soreness may be present for a day or two, also. ° °DIET: Your first meal following the procedure should be a light meal and then it is ok to progress to your normal diet. A half-sandwich or bowl of soup is an example of a good first meal. Heavy or fried foods are harder to digest and may make you feel nauseous or bloated. Drink plenty of fluids but you should avoid alcoholic beverages for 24 hours. If you had a esophageal dilation, please see attached instructions for diet.   ° °ACTIVITY: Your care partner should take you home directly after the procedure. You should plan to take it easy, moving slowly for the rest of the day. You can resume normal activity the day after the procedure however YOU SHOULD NOT DRIVE, use power tools, machinery or perform tasks that involve climbing or major physical exertion for 24 hours (because of the sedation medicines used during the test).  ° °SYMPTOMS TO REPORT IMMEDIATELY: °A gastroenterologist can be reached at any hour. Please call [phone]  for any of the following symptoms:  °Following lower endoscopy (colonoscopy, flexible sigmoidoscopy) °Excessive amounts of blood in the stool  °Significant tenderness, worsening of abdominal pains  °Swelling of the abdomen that is new, acute  °Fever of 100° or  higher  °Following upper endoscopy (EGD, EUS, ERCP, esophageal dilation) °Vomiting of blood or coffee ground material  °New, significant abdominal pain  °New, significant chest pain or pain under the shoulder blades  °Painful or persistently difficult swallowing  °New shortness of breath  °Black, tarry-looking or red, bloody stools ° °FOLLOW UP:  °If any biopsies were taken you will be contacted by phone or by letter within the next 1-3 weeks. Call [phone]  if you have not heard about the biopsies in 3 weeks.  °Please also call with any specific questions about appointments or follow up tests. ° °

## 2017-06-13 NOTE — Op Note (Signed)
[facility] Patient Name: Jose Lawson Procedure Date: 06/13/2017 MRN: [id number] Attending MD: Ladene Artist , MD Date of Birth: [birth year] CSN: [id number] Age: 84 Admit Type: Outpatient Procedure:                Upper GI endoscopy Indications:              Iron deficiency anemia Providers:                Pricilla Riffle. Fuller Plan, MD, Presley Raddle, RN, Cherylynn Ridges, Technician, Virgia Land, CRNA Referring MD:             Ermalene Searing. Philip Aspen, MD Medicines:                Monitored Anesthesia Care Complications:            No immediate complications. Estimated Blood Loss:     Estimated blood loss was minimal. Procedure:                Pre-Anesthesia Assessment:                           - Prior to the procedure, a History and Physical                            was performed, and patient medications and                            allergies were reviewed. The patient's tolerance of                            previous anesthesia was also reviewed. The risks                            and benefits of the procedure and the sedation                            options and risks were discussed with the patient.                            All questions were answered, and informed consent                            was obtained. Prior Anticoagulants: The patient has                            taken no previous anticoagulant or antiplatelet                            agents. ASA Grade Assessment: III - A patient with                            severe systemic disease. After reviewing the risks  and benefits, the patient was deemed in                            satisfactory condition to undergo the procedure.                           After obtaining informed consent, the endoscope was                            passed under direct vision. Throughout the                            procedure, the patient's blood pressure, pulse,  and                            oxygen saturations were monitored continuously. The                            EG-[id number] ([id number]) scope was introduced through the                            mouth, and advanced to the second part of duodenum.                            The upper GI endoscopy was accomplished without                            difficulty. The patient tolerated the procedure                            well. Scope In: Scope Out: Findings:      The examined esophagus was normal.      Three 3 mm no bleeding angiodysplastic lesions were found in the gastric       fundus (2) and in the gastric body (1). Coagulation for bleeding       prevention using argon plasma was successful.      The exam of the stomach was otherwise normal.      The duodenal bulb and second portion of the duodenum were normal. Impression:               - Normal esophagus.                           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 Treated with argon plasma coagulation                            (APC).                           - Normal duodenal bulb and second portion of the  duodenum.                           - No specimens collected. Moderate Sedation:      N/A- Per Anesthesia Care Recommendation:           - Patient has a contact number available for                            emergencies. The signs and symptoms of potential                            delayed complications were discussed with the                            patient. Return to normal activities tomorrow.                            Written discharge instructions were provided to the                            patient.                           - Resume previous diet.                           - Continue present medications.                           - No aspirin, ibuprofen, naproxen, or other                            non-steroidal anti-inflammatory drugs  for 2 weeks.                           - Follow up with PCP as planned. GI follow up as                            needed. Procedure Code(s):        --- Professional ---                           [id number], Esophagogastroduodenoscopy, flexible,                            transoral; with control of bleeding, any method Diagnosis Code(s):        --- Professional ---                           T41.962, Angiodysplasia of stomach and duodenum                            without bleeding                           D50.9, Iron deficiency anemia, unspecified CPT copyright 2017 [employer].  All rights reserved. The codes documented in this report are preliminary and upon coder review may  be revised to meet current compliance requirements. Ladene Artist, MD 06/13/2017 10:28:30 AM This report has been signed electronically. Number of Addenda: 0

## 2017-06-14 ENCOUNTER — Encounter (HOSPITAL_COMMUNITY): Payer: Self-pay | Admitting: Gastroenterology

## 2017-06-15 ENCOUNTER — Encounter: Payer: Self-pay | Admitting: Gastroenterology

## 2017-06-15 DIAGNOSIS — N179 Acute kidney failure, unspecified: Secondary | ICD-10-CM | POA: Diagnosis not present

## 2017-06-15 DIAGNOSIS — R0602 Shortness of breath: Secondary | ICD-10-CM | POA: Diagnosis not present

## 2017-06-15 DIAGNOSIS — J449 Chronic obstructive pulmonary disease, unspecified: Secondary | ICD-10-CM | POA: Diagnosis not present

## 2017-06-21 DIAGNOSIS — R300 Dysuria: Secondary | ICD-10-CM | POA: Diagnosis not present

## 2017-07-07 ENCOUNTER — Ambulatory Visit: Payer: Medicare Other | Admitting: Internal Medicine

## 2017-07-07 ENCOUNTER — Encounter: Payer: Self-pay | Admitting: Internal Medicine

## 2017-07-07 VITALS — BP 128/72 | HR 86 | Ht 76.0 in | Wt 190.0 lb

## 2017-07-07 DIAGNOSIS — J9611 Chronic respiratory failure with hypoxia: Secondary | ICD-10-CM | POA: Diagnosis not present

## 2017-07-07 DIAGNOSIS — J449 Chronic obstructive pulmonary disease, unspecified: Secondary | ICD-10-CM | POA: Diagnosis not present

## 2017-07-07 DIAGNOSIS — E291 Testicular hypofunction: Secondary | ICD-10-CM | POA: Diagnosis not present

## 2017-07-07 NOTE — Progress Notes (Signed)
Subjective:     Patient ID: Jose Good Sr., male   DOB: Jun 17, 1925, 84 y.o.   MRN: [id number]  HPI   84 yo former smoker followed for COPD GOLD II and Pulmoinary HTN  Thyroid and Bladder Cancer    TEST   Data Reviewed: PFTs 07/20/12 FVC 2.66 [52%], FEV1 1.85 (53%], F/F 69, TLC 72%, RV/TLC 134%, DLCO 28%. Moderate-severe obstruction, mild restriction with airtrapping, severe diffusion impairment.  PFTs 10/06/16 FVC 2.49 (53%], FEV1 1.91 (57%], F/F 76, TLC 73%, RV/TLC 133%, DLCO 36% Moderate obstructive airway disease, mild restriction with severe diffusion defect  CT abdomen 05/30/13-mild reticular opacities/scarring at the lung bases right greater than left Chest x-ray 05/02/15-hyperinflation, mild lung base scarring Chest x-ray 06/16/14-hyperinflation, no active cardiopulmonary disease. CT scan 10/07/16- no evidence of interstitial lung disease, 4 mm subpleural pulmonary nodule, right upper lobe 1.1 cm groundglass pulmonary nodule. Coronary atherosclerosis, dilated pulmonary artery I reviewed all images personally.  Echocardiogram 10/27/16 LVEF 09-23%, grade 1 diastolic dysfunction severe elevation and PA systolic pressure.  PA peak pressure of 61 RV cavity is mildly dilated, systolic function is normal.   PFT Moderate Obstruction  HST >Severe OSA  CPAP titration study >  01/20/17 Post Hospital follow up : COPD , O2 RF  Pt presents for a post hospital follow up .  Patient was treated for a COPD exacerbation and UTI with urosepsis  last week.  He was treated with antibiotics.  He did have some diastolic dysfunction.  Requiring diuresis.  Patient says since discharge he is feeling better.  Shortness of breath has decreased cough and congestion have also decreased.  Patient had a recent home sleep study that showed severe sleep apnea.  He has been set up for a CPAP titration study.  That is scheduled for 2 weeks.  Patient is on oxygen at bedtime. He was also discharged on  oxygen 2 L with activity.  O2 saturations walking today on room air 88%.  O2 at 1 L O2 saturations 94%.  Patient needs a portable oxygen concentrator as it is too hard for him to carry his tanks.   OV 02/25/2017 Chief Complaint  Patient presents with  . Follow-up    F/U after seeing TP 01/20/17.  Pt's wife states breathing has become better.  Pt does become SOB when going upstairs or going uphill. Denies any cough or CP.   84 year old man who is quite functional has a diagnosis of COPD based on pulmonary function test.  Interstitial lung disease was suspected but ruled out on high-resolution CT chest in September 2018.  COPD is based on pulmonary function test that is mixed obstruction and restriction.  He does have significant pulmonary hypertension based on CT and echo findings.  In January 2019 he was admitted for an exacerbation that sounded like a viral exacerbation but respiratory virus panel was negative.  He also had some deep compensated cor pulmonale and was diuresed.  Right now he is here for follow-up.  He is beginning to feel better.  In fact is much better.  His only main issue is chronic arthritis and carpal tunnel syndrome.  He does have some fatigue but overall he is improved.  Walking desaturation test shows that he does not desaturate.  He plans to return his portable oxygen away.  He still using his nighttime oxygen.  In the interim he did have a sleep apnea study and it shows he has moderate obstructive sleep apnea without central sleep apnea.  Dr.  Alba reviewed this and is recommended 2 L oxygen at night and also CPAP therapy with a moderate fitting mask.  However he and his wife wanted to see Dr. Gwenlyn Perking first before starting all this.  They want to have a discussion.  He continues with his inhaler of long-acting beta agonist and anticholinergic.  Walking desaturation test on 02/25/2017 185 feet x 3 laps on ROOM AIR:  did not desaturate. Rest pulse ox was 99%, final pulse ox was 92%. HR  response 80/min at rest to 104/min at peak exertion. Patient Jose S Falter Sr.  Did not Desaturate < 88% . Jose S Sur Sr. did yes  Desaturated </= 3% points. In fact dropped 7 points Jose Sr. yes get tachyardic    05/09/2017 Follow up : COPD , Pulmonary HTN and O2 RF  Patient presents for a follow-up.  Patient has underlying moderate COPD On Stiolto.   He was admitted in December for COPD exacerbation and UTI with urosepsis.  He also had decompensated diastolic heart failure that required diuresis.  He improved after this admission.  Had been discharged on oxygen with activity.  And continued on nocturnal oxygen.  He did not use the exertional oxygen and did not show any desaturations with ambulation.  Portable oxygen was sent back to DME. Was noted to have severe pulmonary hypertension on echo 2018.  Previous home sleep study showed severe sleep apnea.  He was sent for a reevaluation with sleep consult February 2019.  He was set up for home sleep study that showed mild sleep apnea with AHI of 10..  We discussed his sleep study results.  He does not wish to proceed with CPAP.  He wishes to continue with oxygen.. Patient says over the last week or 2 has noticed that breathing has not been doing as well.  Gets more short of breath with walking.  Walk test in the office today shows patient does desaturate with walking down to 86% on room air.  Patient was placed on oxygen 2 L with O2 saturations maintaining above 90%..  Patient says he is currently on antibiotics for a UTI.  He denies any increased cough congestion and no fever.  He denies any chest pain, orthopnea, increased edema.    OV 05/16/2017  Chief Complaint  Patient presents with  . Follow-up    qualify for POC-O2 at night, winded with little exertion, SOB with increased exertion   Follow-up COPD/emphysema based on CT scan of the chest January 2019 on Stiolto Follow-up elevated pulmonary artery pressures on  echocardiogram early 2019 Follow-up sleep apnea on nocturnal oxygen.  Declines CPAP  Last seen in February 2019 as part of hospital follow-up from January 2019 when he thought he had a COPD related exacerbation associated with UTI and urosepsis.  And follow-up in February had improved significantly.  Although his pulse ox decline with walking it was still adequate and he returned this exertional oxygen.  He did undergo a sleep clinic follow-up with Dr. Baird Lyons was advised CPAP but given the age and potential lack of benefits he declined.  He continues on nocturnal oxygen.  But he tells me few weeks after the visit with me in February he started noticing worsening dyspnea on exertion while walking to the mailbox and back.  He saw my nurse practitioner May 09, 2017 and it did confirm that he was not desaturating below 88% and as low as 86% and correcting with oxygen.  However the oxygen company  wants him to requalify for portable oxygen system and therefore he is here today.  In addition he is frustrated by the worsening decline in dyspnea since the onset few to several weeks ago.  There is no associated chest pain orthopnea or edema proximal nocturnal dyspnea.  He feels euvolemic.  Noted that he is not on Lasix.  There is no wheezing or cough.  He has not lost any weight.  Of note, in January 2019-minute seen him I was concerned about interstitial lung disease but given the hospitalization and confusing findings on the CT scan with pleural effusion and radiologist calling it atelectasis although this was not a high-resolution CT chest it was felt that may be his primary problem was just emphysema.  05/27/17  Patient reports the office today, for follow-up regarding recent development of chest cold as of 05/25/2017.  Patient recently had a bladder procedure to remove polyps on 05/24/2017.  Patient reports that his catheter came out today.  Patient states that on 5/15 he developed a cough with white to  clear occasional sputum.  No fever, but feels hot.  Reporting some fatigue as well as dry mouth.  Patient sleeping well.  Patient also reporting that he actually feels better today than he had over the past 2 days but still is not 100%.  Patient also reporting that they will get a high risk CT next week.  And on June 3 we will get an endoscopy as well as a colonoscopy.    OV 07/07/2017  Chief Complaint  Patient presents with  . Follow-up    Doing well he is able to be at home and not wear any oxygen and only somtimes wears it while out. He feels he is doing well.     FU emphysemna/ COPD with gold stage 2. ILD ruled out HRCT may 2019  Overall well. COPD cat score is 8 on stiolto. Uses o2 at night and with exertion. Decides exertional use based on subjective needs. Says does 1h of woodwork on room air and pulse ix 85% and bounces back with rest. Wil use o2 when goes out of home. No flare up. Saw Dr Annamaria Boots in may 2019 and advised only o2 for night o2.    CAT COPD Symptom & Quality of Life Score (GSK trademark) 0 is no burden. 5 is highest burden 07/07/2017   Never Cough -> Cough all the time 1  No phlegm in chest -> Chest is full of phlegm 0  No chest tightness -> Chest feels very tight 0  No dyspnea for 1 flight stairs/hill -> Very dyspneic for 1 flight of stairs 3  No limitations for ADL at home -> Very limited with ADL at home 1  Confident leaving home -> Not at all confident leaving home 0  Sleep soundly -> Do not sleep soundly because of lung condition 1  Lots of Energy -> No energy at all 2  TOTAL Score (max 40)  8        Results for Jose, Lawson (MRN [id number]) as of 02/25/2017 10:36  Ref. Range 10/06/2016 10:47  FEV1-Post Latest Units: L 1.91  FEV1-%Pred-Post Latest Units: % 57  FEV1-%Change-Post Latest Units: % 4  Results for KEBRON, PULSE SR. (MRN [id number]) as of 02/25/2017 10:36  Ref. Range 10/06/2016 10:47  DLCO cor Latest Units: ml/min/mmHg 14.66  DLCO  cor % pred Latest Units: % 36     Simple office walk 185 feet x  3 laps goal  with finger  probe 05/16/2017   O2 used Room air  Number laps completed Only 1 and desat top 86%  Comments about pace   Resting Pulse Ox/HR 95% and 89/min  Final Pulse Ox/HR 88% and 99/min  Desaturated </= 88% yes  Desaturated <= 3% points yes  Got Tachycardic >/= 90/min yes  Symptoms at end of test dyspnea  Miscellaneous comments Did  2 more laps at 2L [place] and pulse ox statyed at 90%       has a past medical history of Arthritis, Bifascicular block, Bladder cancer ([place]), BPH (benign prostatic hyperplasia), Cataract, Chronic constipation, Chronic restrictive lung disease, Complication of anesthesia, COPD mixed type ([place]), Dyspnea, Emphysema of lung ([place]), GERD (gastroesophageal reflux disease), History of basal cell carcinoma excision (BILATERAL ARMS), History of colon polyps (2004), thyroid cancer, Hyperlipidemia, Hypertension, Hypothyroidism, Hypothyroidism, postsurgical, Iron deficiency anemia, Nocturia, On home oxygen therapy, Oxygen deficiency, Right bundle branch block, Sigmoid diverticulosis, Wears glasses, and Wears glasses.   reports that he quit smoking about 35 years ago. His smoking use included cigarettes. He has a 70.00 pack-year smoking history. He has never used smokeless tobacco.  Past Surgical History:  Procedure Laterality Date  . CARDIOVASCULAR STRESS TEST  08-17-2010   dr allred   Low risk nuclear study with prominent inferobasal thinning vs small prior infarct, no ischemia/  normal LV function and wall motion , ef 63%  . CATARACT EXTRACTION W/ INTRAOCULAR LENS  IMPLANT, BILATERAL    . COLONOSCOPY    . COLONOSCOPY WITH PROPOFOL N/A 06/13/2017   Procedure: COLONOSCOPY WITH PROPOFOL;  Surgeon: Ladene Artist, MD;  Location: [facility];  Service: Endoscopy;  Laterality: N/A;  . CYSTOSCOPY W/ RETROGRADES Bilateral 10/08/2014   Procedure: CYSTOSCOPY WITH BILATERAL RETROGRADE PYELOGRAM, BIOPSY,  FULGURATION;  Surgeon: Festus Aloe, MD;  [facility];  Service: Urology;  Laterality: Bilateral;  . CYSTOSCOPY W/ RETROGRADES Bilateral 06/18/2016   Procedure: CYSTOSCOPY WITH RETROGRADE PYELOGRAM;  Surgeon: Festus Aloe, MD;  Location: [facility];  Service: Urology;  Laterality: Bilateral;  . CYSTOSCOPY WITH BIOPSY  12/16/2010   Procedure: CYSTOSCOPY WITH BIOPSY;  Surgeon: Molli Hazard, MD;  Location: [facility];  Service: Urology;  Laterality: N/A;  . CYSTOSCOPY WITH BIOPSY N/A 05/20/2015   Procedure: CYSTOSCOPY WITH BLADDER BIOPSY AND FULGERATION, CYSTOGRAM, BILATERAL RETROGRADES, BILATERAL RENAL WASHINGS;  Surgeon: Festus Aloe, MD;  Location: [facility];  Service: Urology;  Laterality: N/A;  . CYSTOSCOPY WITH BIOPSY N/A 06/18/2016   Procedure: CYSTOSCOPY WITH BIOPSY FULGURATION;  Surgeon: Festus Aloe, MD;  Location: [facility];  Service: Urology;  Laterality: N/A;  . CYSTOSCOPY WITH BIOPSY N/A 05/24/2017   Procedure: CYSTOSCOPY WITH BIOPSY/ FULGURATION 0.5 TO 2CM AND LEFT CORD BLOCK;  Surgeon: Festus Aloe, MD;  Location: [facility];  Service: Urology;  Laterality: N/A;  . CYSTOSCOPY/RETROGRADE/URETEROSCOPY  12/16/2010   Procedure: CYSTOSCOPY/RETROGRADE/URETEROSCOPY;  Surgeon: Molli Hazard, MD;  Location: [facility];  Service: Urology;  Laterality: Right;  bilateral retrograde, right ureteroscopy  . ESOPHAGOGASTRODUODENOSCOPY (EGD) WITH PROPOFOL N/A 06/13/2017   Procedure: ESOPHAGOGASTRODUODENOSCOPY (EGD) WITH PROPOFOL;  Surgeon: Ladene Artist, MD;  Location: [facility];  Service: Endoscopy;  Laterality: N/A;  . EYE SURGERY     attached lens to iris  . GREEN LIGHT LASER TURP (TRANSURETHRAL RESECTION OF PROSTATE  11-02-2011  . HOT HEMOSTASIS N/A 06/13/2017   Procedure: HOT HEMOSTASIS (ARGON PLASMA COAGULATION/BICAP);  Surgeon: Ladene Artist, MD;  Location: [facility];  Service: Endoscopy;  Laterality: N/A;  . KNEE ARTHROSCOPY Left 11/  2016  . POLYPECTOMY    . POLYPECTOMY  06/13/2017   Procedure: POLYPECTOMY;  Surgeon: Ladene Artist, MD;  Location: [facility];  Service: Endoscopy;;  . TOTAL THYROIDECTOMY  1991  . TRANSURETHRAL RESECTION OF BLADDER TUMOR  12/16/2010   Procedure: TRANSURETHRAL RESECTION OF BLADDER TUMOR (TURBT);  Surgeon: Molli Hazard, MD;  Location: [facility];  Service: Urology;  Laterality: N/A;    No Known Allergies  Immunization History  Administered Date(s) Administered  . Influenza Whole 10/12/2011  . Influenza, High Dose Seasonal PF 10/04/2016  . Pneumococcal Polysaccharide-23 01/20/2014    Family History  Problem Relation Age of Onset  . Lung cancer Brother   . Thyroid cancer Unknown   . Diabetes Father   . Colon cancer Neg Hx   . Colon polyps Neg Hx   . Rectal cancer Neg Hx   . Stomach cancer Neg Hx      Current Outpatient Medications:  .  atorvastatin (LIPITOR) 20 MG tablet, Take 20 mg by mouth daily after breakfast. , Disp: , Rfl:  .  iron polysaccharides (NIFEREX) 150 MG capsule, Take 150 mg by mouth daily., Disp: , Rfl:  .  levothyroxine (SYNTHROID, LEVOTHROID) 175 MCG tablet, Take 175 mcg by mouth daily after breakfast. , Disp: , Rfl:  .  nitroGLYCERIN (NITRODUR - DOSED IN MG/24 HR) 0.1 mg/hr patch, Place 0.1 mg onto the skin daily., Disp: , Rfl:  .  nitroGLYCERIN (NITROSTAT) 0.4 MG SL tablet, Place 1 tablet (0.4 mg total) under the tongue every 5 (five) minutes as needed for chest pain., Disp: 30 tablet, Rfl: 12 .  pantoprazole (PROTONIX) 40 MG tablet, Take 1 tablet (40 mg total) by mouth daily. (Patient taking differently: Take 40 mg by mouth daily after breakfast. ), Disp: 60 tablet, Rfl: 0 .  phenazopyridine (PYRIDIUM) 100 MG tablet, Take 100 mg by mouth 3 (three) times daily as needed., Disp: , Rfl: 99 .  polyethylene glycol powder (GLYCOLAX/MIRALAX) powder, Take 17 g by mouth daily as needed (for  constipation)., Disp: , Rfl:  .  testosterone enanthate (DELATESTRYL) 200 MG/ML injection, Inject 200 mg into the muscle every 28 (twenty-eight) days. For IM use only, Disp: , Rfl:  .  Tiotropium Bromide-Olodaterol (STIOLTO RESPIMAT) 2.5-2.5 MCG/ACT AERS, Inhale 2 puffs into the lungs daily., Disp: 1 Inhaler, Rfl: 5 .  triamterene-hydrochlorothiazide (MAXZIDE-25) 37.5-25 MG tablet, Take 1 tablet by mouth daily., Disp: , Rfl: 0 .  vitamin B-12 (CYANOCOBALAMIN) 1000 MCG tablet, Take 1,000 mcg by mouth 3 (three) times a week., Disp: , Rfl:  .  Vitamin D, Ergocalciferol, (DRISDOL) 50000 units CAPS capsule, Take 50,000 Units by mouth every Monday. , Disp: , Rfl:    Review of Systems     Objective:   Physical Exam Today's Vitals   07/07/17 1107  BP: 128/72  Pulse: 86  SpO2: 97%  Weight: 190 lb (86.2 kg)  Height: 6\' 4"  (1.93 m)    Estimated body mass index is 23.13 kg/m as calculated from the following:   Height as of this encounter: 6\' 4"  (1.93 m).   Weight as of this encounter: 190 lb (86.2 kg).   General Appearance:    Looks well  Head:    Normocephalic, without obvious abnormality, atraumatic  Eyes:    PERRL - yes, conjunctiva/corneas - clear, LEFT EYE TEARING      Ears:    Normal external ear canals, both ears  Nose:  NG tube - no but has nasal cannula o2  Throat:  ETT TUBE - no , OG tube - no  Neck:   Supple,  No enlargement/tenderness/nodules     Lungs:     barrell chest, basal crackles, no wheeze  Chest wall:    No deformity  Heart:    S1 and S2 normal, no murmur, CVP - no.  Pressors - no  Abdomen:     Soft, no masses, no organomegaly  Genitalia:    Not done  Rectal:   not done  Extremities:   Extremities- intact, DJD + of hand and knees     Skin:   Bruising in knees     Neurologic:   Sedation - none -> RASS - +1 . Moves all 4s - yes. CAM-ICU - neg . Orientation - x 3+        Assessment:       ICD-10-CM   1. COPD GOLD II J44.9   2. Chronic respiratory failure  with hypoxia (HCC) J96.11        Plan:       COpd stable   Plan Continue spiriva daily + albuterol as needed Support no escalation in inhalers Flu shot in fall Use o2 at night and exertion as you do Keep active  Followup 3 months of sooner if needed    Dr. Brand Males, M.D., [facility].C.P Pulmonary and Critical Care Medicine Staff Physician, [place] Director - Interstitial Lung Disease  Program  Pulmonary [place] at [place], [place], [address]  Pager: [phone], If no answer or between  15:00h - 7:00h: call [phone] Telephone: [phone]

## 2017-07-07 NOTE — Patient Instructions (Addendum)
ICD-10-CM   1. COPD GOLD II J44.9   2. Chronic respiratory failure with hypoxia (HCC) J96.11      COpd stable   Plan Continue spiriva daily + albuterol as needed Support no escalation in inhalers Flu shot in fall Use o2 at night and exertion as you do Keep active  Followup 3 months of sooner if needed

## 2017-07-11 DIAGNOSIS — N3000 Acute cystitis without hematuria: Secondary | ICD-10-CM | POA: Diagnosis not present

## 2017-07-15 DIAGNOSIS — J449 Chronic obstructive pulmonary disease, unspecified: Secondary | ICD-10-CM | POA: Diagnosis not present

## 2017-07-15 DIAGNOSIS — R0602 Shortness of breath: Secondary | ICD-10-CM | POA: Diagnosis not present

## 2017-07-15 DIAGNOSIS — N179 Acute kidney failure, unspecified: Secondary | ICD-10-CM | POA: Diagnosis not present

## 2017-07-20 DIAGNOSIS — M170 Bilateral primary osteoarthritis of knee: Secondary | ICD-10-CM | POA: Diagnosis not present

## 2017-07-26 DIAGNOSIS — N3000 Acute cystitis without hematuria: Secondary | ICD-10-CM | POA: Diagnosis not present

## 2017-07-26 DIAGNOSIS — R300 Dysuria: Secondary | ICD-10-CM | POA: Diagnosis not present

## 2017-08-11 DIAGNOSIS — E291 Testicular hypofunction: Secondary | ICD-10-CM | POA: Diagnosis not present

## 2017-08-11 DIAGNOSIS — D649 Anemia, unspecified: Secondary | ICD-10-CM | POA: Diagnosis not present

## 2017-08-15 ENCOUNTER — Other Ambulatory Visit: Payer: Self-pay | Admitting: Pulmonary Disease

## 2017-08-15 DIAGNOSIS — J449 Chronic obstructive pulmonary disease, unspecified: Secondary | ICD-10-CM | POA: Diagnosis not present

## 2017-08-15 DIAGNOSIS — R0602 Shortness of breath: Secondary | ICD-10-CM | POA: Diagnosis not present

## 2017-08-15 DIAGNOSIS — N179 Acute kidney failure, unspecified: Secondary | ICD-10-CM | POA: Diagnosis not present

## 2017-08-24 DIAGNOSIS — C672 Malignant neoplasm of lateral wall of bladder: Secondary | ICD-10-CM | POA: Diagnosis not present

## 2017-08-24 DIAGNOSIS — R300 Dysuria: Secondary | ICD-10-CM | POA: Diagnosis not present

## 2017-08-29 DIAGNOSIS — D0439 Carcinoma in situ of skin of other parts of face: Secondary | ICD-10-CM | POA: Diagnosis not present

## 2017-08-29 DIAGNOSIS — L570 Actinic keratosis: Secondary | ICD-10-CM | POA: Diagnosis not present

## 2017-08-29 DIAGNOSIS — D0460 Carcinoma in situ of skin of unspecified upper limb, including shoulder: Secondary | ICD-10-CM | POA: Diagnosis not present

## 2017-08-29 DIAGNOSIS — Z85828 Personal history of other malignant neoplasm of skin: Secondary | ICD-10-CM | POA: Diagnosis not present

## 2017-08-29 DIAGNOSIS — D0461 Carcinoma in situ of skin of right upper limb, including shoulder: Secondary | ICD-10-CM | POA: Diagnosis not present

## 2017-08-29 DIAGNOSIS — C44722 Squamous cell carcinoma of skin of right lower limb, including hip: Secondary | ICD-10-CM | POA: Diagnosis not present

## 2017-08-29 DIAGNOSIS — D485 Neoplasm of uncertain behavior of skin: Secondary | ICD-10-CM | POA: Diagnosis not present

## 2017-09-13 DIAGNOSIS — N3000 Acute cystitis without hematuria: Secondary | ICD-10-CM | POA: Diagnosis not present

## 2017-09-13 DIAGNOSIS — C672 Malignant neoplasm of lateral wall of bladder: Secondary | ICD-10-CM | POA: Diagnosis not present

## 2017-09-15 DIAGNOSIS — J449 Chronic obstructive pulmonary disease, unspecified: Secondary | ICD-10-CM | POA: Diagnosis not present

## 2017-09-15 DIAGNOSIS — E291 Testicular hypofunction: Secondary | ICD-10-CM | POA: Diagnosis not present

## 2017-09-15 DIAGNOSIS — R0602 Shortness of breath: Secondary | ICD-10-CM | POA: Diagnosis not present

## 2017-09-15 DIAGNOSIS — N179 Acute kidney failure, unspecified: Secondary | ICD-10-CM | POA: Diagnosis not present

## 2017-09-30 DIAGNOSIS — R8279 Other abnormal findings on microbiological examination of urine: Secondary | ICD-10-CM | POA: Diagnosis not present

## 2017-09-30 DIAGNOSIS — C672 Malignant neoplasm of lateral wall of bladder: Secondary | ICD-10-CM | POA: Diagnosis not present

## 2017-10-07 ENCOUNTER — Other Ambulatory Visit: Payer: Self-pay | Admitting: Urology

## 2017-10-10 ENCOUNTER — Ambulatory Visit: Payer: Medicare Other | Admitting: Internal Medicine

## 2017-10-10 ENCOUNTER — Encounter: Payer: Self-pay | Admitting: Internal Medicine

## 2017-10-10 VITALS — BP 112/64 | HR 88 | Ht 76.0 in | Wt 191.0 lb

## 2017-10-10 DIAGNOSIS — J449 Chronic obstructive pulmonary disease, unspecified: Secondary | ICD-10-CM | POA: Diagnosis not present

## 2017-10-10 DIAGNOSIS — Z23 Encounter for immunization: Secondary | ICD-10-CM

## 2017-10-10 DIAGNOSIS — J9611 Chronic respiratory failure with hypoxia: Secondary | ICD-10-CM

## 2017-10-10 NOTE — Addendum Note (Signed)
Addended by: Lorretta Harp on: 10/10/2017 09:29 AM   Modules accepted: Orders

## 2017-10-10 NOTE — Progress Notes (Signed)
84 yo former smoker followed for COPD GOLD II and Pulmoinary HTN  Thyroid and Bladder Cancer    TEST   Data Reviewed: PFTs 07/20/12 FVC 2.66 [52%], FEV1 1.85 (53%], F/F 69, TLC 72%, RV/TLC 134%, DLCO 28%. Moderate-severe obstruction, mild restriction with airtrapping, severe diffusion impairment.  PFTs 10/06/16 FVC 2.49 (53%], FEV1 1.91 (57%], F/F 76, TLC 73%, RV/TLC 133%, DLCO 36% Moderate obstructive airway disease, mild restriction with severe diffusion defect  CT abdomen 05/30/13-mild reticular opacities/scarring at the lung bases right greater than left Chest x-ray 05/02/15-hyperinflation, mild lung base scarring Chest x-ray 06/16/14-hyperinflation, no active cardiopulmonary disease. CT scan 10/07/16- no evidence of interstitial lung disease, 4 mm subpleural pulmonary nodule, right upper lobe 1.1 cm groundglass pulmonary nodule. Coronary atherosclerosis, dilated pulmonary artery I reviewed all images personally.  Echocardiogram 10/27/16 LVEF 15-40%, grade 1 diastolic dysfunction severe elevation and PA systolic pressure.  PA peak pressure of 61 RV cavity is mildly dilated, systolic function is normal.   PFT Moderate Obstruction  HST >Severe OSA  CPAP titration study >  01/20/17 Post Hospital follow up : COPD , O2 RF  Pt presents for a post hospital follow up .  Patient was treated for a COPD exacerbation and UTI with urosepsis  last week.  He was treated with antibiotics.  He did have some diastolic dysfunction.  Requiring diuresis.  Patient says since discharge he is feeling better.  Shortness of breath has decreased cough and congestion have also decreased.  Patient had a recent home sleep study that showed severe sleep apnea.  He has been set up for a CPAP titration study.  That is scheduled for 2 weeks.  Patient is on oxygen at bedtime. He was also discharged on oxygen 2 L with activity.  O2 saturations walking today on room air 88%.  O2 at 1 L O2 saturations 94%.   Patient needs a portable oxygen concentrator as it is too hard for him to carry his tanks.   OV 02/25/2017 Chief Complaint  Patient presents with  . Follow-up    F/U after seeing TP 01/20/17.  Pt's wife states breathing has become better.  Pt does become SOB when going upstairs or going uphill. Denies any cough or CP.   84 year old man who is quite functional has a diagnosis of COPD based on pulmonary function test.  Interstitial lung disease was suspected but ruled out on high-resolution CT chest in September 2018.  COPD is based on pulmonary function test that is mixed obstruction and restriction.  He does have significant pulmonary hypertension based on CT and echo findings.  In January 2019 he was admitted for an exacerbation that sounded like a viral exacerbation but respiratory virus panel was negative.  He also had some deep compensated cor pulmonale and was diuresed.  Right now he is here for follow-up.  He is beginning to feel better.  In fact is much better.  His only main issue is chronic arthritis and carpal tunnel syndrome.  He does have some fatigue but overall he is improved.  Walking desaturation test shows that he does not desaturate.  He plans to return his portable oxygen away.  He still using his nighttime oxygen.  In the interim he did have a sleep apnea study and it shows he has moderate obstructive sleep apnea without central sleep apnea.  Dr. Eartha Inch reviewed this and is recommended 2 L oxygen at night and also CPAP therapy with a moderate fitting mask.  However he  and his wife wanted to see Dr. Gwenlyn Perking first before starting all this.  They want to have a discussion.  He continues with his inhaler of long-acting beta agonist and anticholinergic.  Walking desaturation test on 02/25/2017 185 feet x 3 laps on ROOM AIR:  did not desaturate. Rest pulse ox was 99%, final pulse ox was 92%. HR response 80/min at rest to 104/min at peak exertion. Patient Jose S Soy Sr.  Did not Desaturate <  88% . Jose S Vanloan Sr. did yes  Desaturated </= 3% points. In fact dropped 7 points Jose Acacia Sr. yes get tachyardic    05/09/2017 Follow up : COPD , Pulmonary HTN and O2 RF  Patient presents for a follow-up.  Patient has underlying moderate COPD On Stiolto.   He was admitted in December for COPD exacerbation and UTI with urosepsis.  He also had decompensated diastolic heart failure that required diuresis.  He improved after this admission.  Had been discharged on oxygen with activity.  And continued on nocturnal oxygen.  He did not use the exertional oxygen and did not show any desaturations with ambulation.  Portable oxygen was sent back to DME. Was noted to have severe pulmonary hypertension on echo 2018.  Previous home sleep study showed severe sleep apnea.  He was sent for a reevaluation with sleep consult February 2019.  He was set up for home sleep study that showed mild sleep apnea with AHI of 10..  We discussed his sleep study results.  He does not wish to proceed with CPAP.  He wishes to continue with oxygen.. Patient says over the last week or 2 has noticed that breathing has not been doing as well.  Gets more short of breath with walking.  Walk test in the office today shows patient does desaturate with walking down to 86% on room air.  Patient was placed on oxygen 2 L with O2 saturations maintaining above 90%..  Patient says he is currently on antibiotics for a UTI.  He denies any increased cough congestion and no fever.  He denies any chest pain, orthopnea, increased edema.    OV 05/16/2017  Chief Complaint  Patient presents with  . Follow-up    qualify for POC-O2 at night, winded with little exertion, SOB with increased exertion   Follow-up COPD/emphysema based on CT scan of the chest January 2019 on Stiolto Follow-up elevated pulmonary artery pressures on echocardiogram early 2019 Follow-up sleep apnea on nocturnal oxygen.  Declines CPAP  Last seen in February 2019  as part of hospital follow-up from January 2019 when he thought he had a COPD related exacerbation associated with UTI and urosepsis.  And follow-up in February had improved significantly.  Although his pulse ox decline with walking it was still adequate and he returned this exertional oxygen.  He did undergo a sleep clinic follow-up with Dr. Baird Lyons was advised CPAP but given the age and potential lack of benefits he declined.  He continues on nocturnal oxygen.  But he tells me few weeks after the visit with me in February he started noticing worsening dyspnea on exertion while walking to the mailbox and back.  He saw my nurse practitioner May 09, 2017 and it did confirm that he was not desaturating below 88% and as low as 86% and correcting with oxygen.  However the oxygen company wants him to requalify for portable oxygen system and therefore he is here today.  In addition he is frustrated by the worsening  decline in dyspnea since the onset few to several weeks ago.  There is no associated chest pain orthopnea or edema proximal nocturnal dyspnea.  He feels euvolemic.  Noted that he is not on Lasix.  There is no wheezing or cough.  He has not lost any weight.  Of note, in January 2019-minute seen him I was concerned about interstitial lung disease but given the hospitalization and confusing findings on the CT scan with pleural effusion and radiologist calling it atelectasis although this was not a high-resolution CT chest it was felt that may be his primary problem was just emphysema.  05/27/17  Patient reports the office today, for follow-up regarding recent development of chest cold as of 05/25/2017.  Patient recently had a bladder procedure to remove polyps on 05/24/2017.  Patient reports that his catheter came out today.  Patient states that on 5/15 he developed a cough with white to clear occasional sputum.  No fever, but feels hot.  Reporting some fatigue as well as dry mouth.  Patient sleeping  well.  Patient also reporting that he actually feels better today than he had over the past 2 days but still is not 100%.  Patient also reporting that they will get a high risk CT next week.  And on June 3 we will get an endoscopy as well as a colonoscopy.    OV 07/07/2017  Chief Complaint  Patient presents with  . Follow-up    Doing well he is able to be at home and not wear any oxygen and only somtimes wears it while out. He feels he is doing well.     FU emphysemna/ COPD with gold stage 2. ILD ruled out HRCT may 2019  Overall well. COPD cat score is 8 on stiolto. Uses o2 at night and with exertion. Decides exertional use based on subjective needs. Says does 1h of woodwork on room air and pulse ix 85% and bounces back with rest. Wil use o2 when goes out of home. No flare up. Saw Dr Annamaria Boots in may 2019 and advised only o2 for night o2.      Results for RAYDAN, SCHLABACH (MRN [id number]) as of 02/25/2017 10:36  Ref. Range 10/06/2016 10:47  FEV1-Post Latest Units: L 1.91  FEV1-%Pred-Post Latest Units: % 57  FEV1-%Change-Post Latest Units: % 4  Results for SILVINO, SELMAN SR. (MRN [id number]) as of 02/25/2017 10:36  Ref. Range 10/06/2016 10:47  DLCO cor Latest Units: ml/min/mmHg 14.66  DLCO cor % pred Latest Units: % 36        OV 10/10/2017  Subjective:  Patient ID: Jose Good Sr., male , DOB: August 19, 1925 , age 12 y.o. , MRN: [id number] , ADDRESS: [place] [id number]   10/10/2017 -   Chief Complaint  Patient presents with  . Follow-up    Pt states he is about the same since last visit. Pt states he still becomes SOB when he is going upstairs and will become SOB when he exerts himself. Denies any cough or CP.     FU emphysemna/ COPD with gold stage 2. ILD ruled out HRCT may 2019  HPI Jose SEBO Sr. 84 y.o. -presents for follow-up.  He has no new interim issues other than the fact that he tripped climbing up one step and fell down.  He  scraped his forehead and nose with abrasion and also left elbow and left shin.  But this is healing well.  No loss of consciousness or  no double vision or no focal neurologic deficits.  He feels a COPD stable.  He continues on [employer].  He uses oxygen as needed.  He is in need of Prevnar vaccine and high-dose flu shot.  There are no other issues.  His main quality of life is affected by arthritis at this point he says.  He is going to be 84 years old in 1 week.  His wife feels he is doing well.    CAT COPD Symptom & Quality of Life Score (GSK trademark) 0 is no burden. 5 is highest burden 07/07/2017  10/10/2017   Never Cough -> Cough all the time 1 2  No phlegm in chest -> Chest is full of phlegm 0 1  No chest tightness -> Chest feels very tight 0 0  No dyspnea for 1 flight stairs/hill -> Very dyspneic for 1 flight of stairs 3 3  No limitations for ADL at home -> Very limited with ADL at home 1 2  Confident leaving home -> Not at all confident leaving home 0 0  Sleep soundly -> Do not sleep soundly because of lung condition 1 0  Lots of Energy -> No energy at all 2 2  TOTAL Score (max 40)  8 10      Simple office walk 185 feet x  3 laps goal with finger  probe 05/16/2017   O2 used Room air  Number laps completed Only 1 and desat top 86%  Comments about pace   Resting Pulse Ox/HR 95% and 89/min  Final Pulse Ox/HR 88% and 99/min  Desaturated </= 88% yes  Desaturated <= 3% points yes  Got Tachycardic >/= 90/min yes  Symptoms at end of test dyspnea  Miscellaneous comments Did  2 more laps at 2L Jose and pulse ox statyed at 90%   ROS - per HPI     has a past medical history of Arthritis, Bifascicular block, Bladder cancer ([place]), BPH (benign prostatic hyperplasia), Cataract, Chronic constipation, Chronic restrictive lung disease, Complication of anesthesia, COPD mixed type ([place]), Dyspnea, Emphysema of lung ([place]), GERD (gastroesophageal reflux disease), History of basal cell carcinoma  excision (BILATERAL ARMS), History of colon polyps (2004), thyroid cancer, Hyperlipidemia, Hypertension, Hypothyroidism, Hypothyroidism, postsurgical, Iron deficiency anemia, Nocturia, On home oxygen therapy, Oxygen deficiency, Right bundle branch block, Sigmoid diverticulosis, Wears glasses, and Wears glasses.   reports that he quit smoking about 35 years ago. His smoking use included cigarettes. He has a 70.00 pack-year smoking history. He has never used smokeless tobacco.  Past Surgical History:  Procedure Laterality Date  . CARDIOVASCULAR STRESS TEST  08-17-2010   dr allred   Low risk nuclear study with prominent inferobasal thinning vs small prior infarct, no ischemia/  normal LV function and wall motion , ef 63%  . CATARACT EXTRACTION W/ INTRAOCULAR LENS  IMPLANT, BILATERAL    . COLONOSCOPY    . COLONOSCOPY WITH PROPOFOL N/A 06/13/2017   Procedure: COLONOSCOPY WITH PROPOFOL;  Surgeon: Ladene Artist, MD;  Location: [facility];  Service: Endoscopy;  Laterality: N/A;  . CYSTOSCOPY W/ RETROGRADES Bilateral 10/08/2014   Procedure: CYSTOSCOPY WITH BILATERAL RETROGRADE PYELOGRAM, BIOPSY, FULGURATION;  Surgeon: Festus Aloe, MD;  Location[facility];  Service: Urology;  Laterality: Bilateral;  . CYSTOSCOPY W/ RETROGRADES Bilateral 06/18/2016   Procedure: CYSTOSCOPY WITH RETROGRADE PYELOGRAM;  Surgeon: Festus Aloe, MD;  Location: [facility];  Service: Urology;  Laterality: Bilateral;  . CYSTOSCOPY WITH BIOPSY  12/16/2010   Procedure: CYSTOSCOPY WITH BIOPSY;  Surgeon: Molli Hazard, MD;  Location[facility];  Service: Urology;  Laterality: N/A;  . CYSTOSCOPY WITH BIOPSY N/A 05/20/2015   Procedure: CYSTOSCOPY WITH BLADDER BIOPSY AND FULGERATION, CYSTOGRAM, BILATERAL RETROGRADES, BILATERAL RENAL WASHINGS;  Surgeon: Festus Aloe, MD;  Location: [facility];  Service: Urology;  Laterality: N/A;  . CYSTOSCOPY WITH BIOPSY N/A 06/18/2016    Procedure: CYSTOSCOPY WITH BIOPSY FULGURATION;  Surgeon: Festus Aloe, MD;  Location: [facility];  Service: Urology;  Laterality: N/A;  . CYSTOSCOPY WITH BIOPSY N/A 05/24/2017   Procedure: CYSTOSCOPY WITH BIOPSY/ FULGURATION 0.5 TO 2CM AND LEFT CORD BLOCK;  Surgeon: Festus Aloe, MD;  Location: [facility];  Service: Urology;  Laterality: N/A;  . CYSTOSCOPY/RETROGRADE/URETEROSCOPY  12/16/2010   Procedure: CYSTOSCOPY/RETROGRADE/URETEROSCOPY;  Surgeon: Molli Hazard, MD;  Location: [facility];  Service: Urology;  Laterality: Right;  bilateral retrograde, right ureteroscopy  . ESOPHAGOGASTRODUODENOSCOPY (EGD) WITH PROPOFOL N/A 06/13/2017   Procedure: ESOPHAGOGASTRODUODENOSCOPY (EGD) WITH PROPOFOL;  Surgeon: Ladene Artist, MD;  Location: [facility];  Service: Endoscopy;  Laterality: N/A;  . EYE SURGERY     attached lens to iris  . GREEN LIGHT LASER TURP (TRANSURETHRAL RESECTION OF PROSTATE  11-02-2011  . HOT HEMOSTASIS N/A 06/13/2017   Procedure: HOT HEMOSTASIS (ARGON PLASMA COAGULATION/BICAP);  Surgeon: Ladene Artist, MD;  Location: [facility];  Service: Endoscopy;  Laterality: N/A;  . KNEE ARTHROSCOPY Left 11/  2016  . POLYPECTOMY    . POLYPECTOMY  06/13/2017   Procedure: POLYPECTOMY;  Surgeon: Ladene Artist, MD;  Location: [facility];  Service: Endoscopy;;  . TOTAL THYROIDECTOMY  1991  . TRANSURETHRAL RESECTION OF BLADDER TUMOR  12/16/2010   Procedure: TRANSURETHRAL RESECTION OF BLADDER TUMOR (TURBT);  Surgeon: Molli Hazard, MD;  Location: [facility];  Service: Urology;  Laterality: N/A;    No Known Allergies  Immunization History  Administered Date(s) Administered  . Influenza Whole 10/12/2011  . Influenza, High Dose Seasonal PF 10/04/2016  . Pneumococcal Polysaccharide-23 01/20/2014    Family History  Problem Relation Age of Onset  . Lung cancer Brother   . Thyroid cancer Unknown   . Diabetes Father   . Colon cancer Neg Hx     . Colon polyps Neg Hx   . Rectal cancer Neg Hx   . Stomach cancer Neg Hx      Current Outpatient Medications:  .  atorvastatin (LIPITOR) 20 MG tablet, Take 20 mg by mouth daily after breakfast. , Disp: , Rfl:  .  iron polysaccharides (NIFEREX) 150 MG capsule, Take 150 mg by mouth daily., Disp: , Rfl:  .  levothyroxine (SYNTHROID, LEVOTHROID) 175 MCG tablet, Take 175 mcg by mouth daily after breakfast. , Disp: , Rfl:  .  nitroGLYCERIN (NITRODUR - DOSED IN MG/24 HR) 0.1 mg/hr patch, Place 0.1 mg onto the skin daily., Disp: , Rfl:  .  nitroGLYCERIN (NITROSTAT) 0.4 MG SL tablet, Place 1 tablet (0.4 mg total) under the tongue every 5 (five) minutes as needed for chest pain., Disp: 30 tablet, Rfl: 12 .  pantoprazole (PROTONIX) 40 MG tablet, Take 1 tablet (40 mg total) by mouth daily. (Patient taking differently: Take 40 mg by mouth daily after breakfast. ), Disp: 60 tablet, Rfl: 0 .  polyethylene glycol powder (GLYCOLAX/MIRALAX) powder, Take 17 g by mouth daily as needed (for constipation)., Disp: , Rfl:  .  STIOLTO RESPIMAT 2.5-2.5 MCG/ACT AERS, INHALE 2 PUFFS BY MOUTH ONCE DAILY, Disp: 4 g, Rfl: 5 .  testosterone enanthate (DELATESTRYL) 200 MG/ML injection, Inject 200 mg into the muscle every 28 (twenty-eight) days. For IM use only, Disp: , Rfl:  .  triamterene-hydrochlorothiazide (MAXZIDE-25) 37.5-25 MG tablet, Take 1 tablet by mouth daily., Disp: , Rfl: 0 .  vitamin B-12 (CYANOCOBALAMIN) 1000 MCG tablet, Take 1,000 mcg by mouth 3 (three) times a week., Disp: , Rfl:  .  Vitamin D, Ergocalciferol, (DRISDOL) 50000 units CAPS capsule, Take 50,000 Units by mouth every Monday. , Disp: , Rfl:       Objective:   Vitals:   10/10/17 0909  BP: 112/64  Pulse: 88  SpO2: 99%  Weight: 191 lb (86.6 kg)  Height: 6\' 4"  (1.93 m)    Estimated body mass index is 23.25 kg/m as calculated from the following:   Height as of this encounter: 6\' 4"  (1.93 m).   Weight as of this encounter: 191 lb (86.6  kg).  @WEIGHTCHANGE @  [employer]   10/10/17 0909  Weight: 191 lb (86.6 kg)     Physical Exam  General Appearance:    Alert, cooperative, no distress, appears stated age - yes , Deconditioned looking - no , OBESE  - no, Sitting on Wheelchair -  no  Head:    Normocephalic, without obvious abnormality, atraumatic  Eyes:    PERRL, conjunctiva/corneas clear,  Ears:    Normal TM's and external ear canals, both ears  Nose:   Nares normal, septum midline, mucosa normal, no drainage    or sinus tenderness. OXYGEN ON  - yes . Patient is @ 2L   Throat:   Lips, mucosa, and tongue normal; teeth and gums normal. Cyanosis on lips - no  Neck:   Supple, symmetrical, trachea midline, no adenopathy;    thyroid:  no enlargement/tenderness/nodules; no carotid   bruit or JVD  Back:     Symmetric, no curvature, ROM normal, no CVA tenderness  Lungs:     Distress - no , Wheeze no, Barrell Chest - mild yes, Purse lip breathing - no, Crackles - bilateral basal crackles yes baseline   Chest Wall:    No tenderness or deformity.    Heart:    Regular rate and rhythm, S1 and S2 normal, no rub   or gallop, Murmur - no  Breast Exam:    NOT DONE  Abdomen:     Soft, non-tender, bowel sounds active all four quadrants,    no masses, no organomegaly. Visceral obesity - no  Genitalia:   NOT DONE  Rectal:   NOT DONE  Extremities:   Extremities - normal, Has Cane - no, Clubbing - no, Edema - mild chronic  Pulses:   2+ and symmetric all extremities  Skin:   Stigmata of Connective Tissue Disease - n  Lymph nodes:   Cervical, supraclavicular, and axillary nodes normal  Psychiatric:  Neurologic:   Pleasant - yes, Anxious - no, Flat affect - no  CAm-ICU - neg, Alert and Oriented x 3 - yes, Moves all 4s - yes, Speech - normal, Cognition - intact           Assessment:       ICD-10-CM   1. Chronic respiratory failure with hypoxia (HCC) J96.11   2. COPD GOLD II J44.9        Plan:     Patient Instructions      ICD-10-CM   1. Chronic respiratory failure with hypoxia (HCC) J96.11   2. COPD GOLD II J44.9     Stable disease  Plan -Continue Stiolto daily with oxygen as needed for exertion -Albuterol as needed -High dose flu shot today October 10, 2017 -Prevnar vaccine today October 10, 2017  Follow-up 3-4 months or sooner if needed     SIGNATURE    Dr. Brand Males, M.D., F.C.C.P,  Pulmonary and Critical Care Medicine Staff Physician, [place] Director - Interstitial Lung Disease  Program  Pulmonary [place] [facility] [place], [place], [address]  Pager: [phone], If no answer or between  15:00h - 7:00h: call [phone] Telephone: [phone]  9:18 AM 10/10/2017

## 2017-10-10 NOTE — Patient Instructions (Addendum)
ICD-10-CM   1. Chronic respiratory failure with hypoxia (HCC) J96.11   2. COPD GOLD II J44.9     Stable disease  Plan -Continue Stiolto daily with oxygen as needed for exertion -Albuterol as needed -High dose flu shot today October 10, 2017 -Prevnar vaccine today October 10, 2017  Follow-up 3-4 months or sooner if needed

## 2017-10-15 DIAGNOSIS — J449 Chronic obstructive pulmonary disease, unspecified: Secondary | ICD-10-CM | POA: Diagnosis not present

## 2017-10-15 DIAGNOSIS — R0602 Shortness of breath: Secondary | ICD-10-CM | POA: Diagnosis not present

## 2017-10-15 DIAGNOSIS — N179 Acute kidney failure, unspecified: Secondary | ICD-10-CM | POA: Diagnosis not present

## 2017-10-18 ENCOUNTER — Encounter (HOSPITAL_BASED_OUTPATIENT_CLINIC_OR_DEPARTMENT_OTHER): Payer: Self-pay

## 2017-10-18 DIAGNOSIS — M1712 Unilateral primary osteoarthritis, left knee: Secondary | ICD-10-CM | POA: Diagnosis not present

## 2017-10-18 DIAGNOSIS — R200 Anesthesia of skin: Secondary | ICD-10-CM | POA: Diagnosis not present

## 2017-10-18 DIAGNOSIS — M1711 Unilateral primary osteoarthritis, right knee: Secondary | ICD-10-CM | POA: Diagnosis not present

## 2017-10-18 NOTE — Pre-Procedure Instructions (Addendum)
Mr. Jose Lawson last echo 10/27/2016 showed pulmonary hypertension systolic pressure 61 and pt is on home oxygen therapy not a candidate for [facility].

## 2017-10-19 ENCOUNTER — Other Ambulatory Visit: Payer: Self-pay

## 2017-10-19 ENCOUNTER — Encounter (HOSPITAL_COMMUNITY): Payer: Self-pay | Admitting: *Deleted

## 2017-10-20 NOTE — H&P (Signed)
Office Visit Report     09/30/2017   --------------------------------------------------------------------------------   Jose Lawson  MRN: [id number]  PRIMARY CARE:  Ermalene Searing. Philip Aspen, MD  DOB: 23-Apr-1925, 84 year old Male  REFERRING:  Azucena Fallen, NP  SSN: -[id number]  PROVIDER:  Festus Aloe, M.D.    LOCATION:  [facility] [phone]   --------------------------------------------------------------------------------   CC: I have bladder cancer.  HPI: Jose Lawson is a 84 year-old male established patient who is here for bladder cancer.  His bladder cancer was treated by removal with scope. Patient denies removal of the entire bladder, radiation, and chemotherapy.   His last cysto was 01/06/2017.   His last U/S or CT Scan was 06/18/2016.   Biopsy hx:  -Dec 2012 LG Ta and CIS.  -September 2016 left posterior CIS recurrence. Biopsied and fulgurated.  -May 2017 - focal CIS - left post and dome - fulgurated  -Jun 2018 cysto, bbx - post and dome CIS; bilateral washes (and RGP (normal)  -May 2019 multifocal HG T1 and two areas suspicous for T1 + epirubicin PACU   Last upper tract imaging:  Jun 2018 bilateral RGP's / washes  May 2017 bilateral RGP's / washes - negative  May 2015 - CT abdomen and pelvis   Last BCG: Feb 2014 - stopped due to BCG fever (fever 103 after BCG 1/2 dose. Urine & blood cultures negative). Jan 2013 completed 3 doses- had Enterococcus infection and subsequent left epididymitis. Had BCG reaction after #4 resulting in severe LUTS.   He saw Dr. Alen Blew and decided against mitomycin-C installations.   H/o chronic left testicular pain. He had spermatic cord block done May 2019 during a bladder biopsy and prior cord botox with Dr. Odis Luster.   His last creatinine was 1.31 May 2017 with a GFR of 51.   He returns and is doing well. Last urine culture was negative. He has chronic dysuria.     ALLERGIES: Doxycycline Hyclate CAPS - Other  Reaction, unknown Tamsulosin - Dizziness    MEDICATIONS: Levothyroxine Sodium 175 mcg tablet  Atorvastatin Calcium 20 MG Oral Tablet Oral  Nitroglycerin 0.4 mg tablet, sublingual  Oxygen 1 PO Daily As Directed  Pantoprazole Sodium 40 mg tablet, delayed release Oral  Polysaccharide Iron 150  Stiolto Respimat 2.5 mcg-2.5 mcg/actuation mist inhaler  Testosterone Enanthate  Triamterene-Hydrochlorothiazid 37.5 mg-25 mg capsule Oral  Vitamin B12  Vitamin D3     GU PSH: Bladder Instill AntiCA Agent - 05/24/2017 Catheterization For Collection Of Specimen, Single Patient, All Places Of Service - 2017 Catheterize For Residual - 2017 Cysto Bladder Ureth Biopsy - 2012 Cysto Uretero Biopsy Fulgura - 06/18/2016 Cystoscopy - 05/11/2017, 01/06/2017, 09/28/2016, 05/17/2016, 11/12/2015, 2017 Cystoscopy TURBT <2 cm - 05/24/2017, 06/18/2016, 2017 Cystoscopy TURBT 2-5 cm - 2016 Cystoscopy Ureteroscopy - 2012 Laser Surgery Prostate - 2013 N Block Inj Ilio-ing/hypogi - 11/12/2015    NON-GU PSH: Knee Arthroscopy; Dx - 2017 Remove Thyroid - 2010    GU PMH: Bladder Cancer Lateral - 09/13/2017, - 08/24/2017, - 05/11/2017, - 01/06/2017, - 09/28/2016, Malignant neoplasm of lateral wall of urinary bladder, - 2016 Chronic cystitis (w/o hematuria) - 03/28/2017, - 02/25/2017 Acute Cystitis/UTI - 03/14/2017, - 01/25/2017 BPH w/LUTS - 01/27/2017 Nocturia - 01/27/2017 Urinary Frequency (Stable) - 01/27/2017, Increased urinary frequency, - 2015 Left testicular pain - 09/28/2016 Epididymitis, Left - 08/27/2016, Epididymitis, left, - 2015 Bladder Cancer overlapping sites - 06/29/2016, Malignant neoplasm of overlapping sites of bladder, - 2016 Dysuria - 06/29/2016, (Worsening, Chronic), -  2017, - 2017, - 2017, Dysuria, - 2017 Bladder Cancer Posterior - 05/17/2016, - 11/12/2015, - 2017 Disorder of male genital organs, unspecified - 05/17/2016, - 11/12/2015 Abdominal Pain Unspec - 2017 Balanitis, Balanitis - 2017 CIS of the bladder, CIS  (carcinoma in situ of bladder) - 2017 BPH w/o LUTS, BPH without urinary obstruction - 2016 Testicular pain, unspecified, Testicle pain - 2015 Urinary Retention, Unspec, Incomplete bladder emptying - 2015 Primary hypogonadism, Hypogonadism, testicular - 2015 History of bladder cancer, History of bladder cancer - 2015 Urinary Tract Inf, Unspec site, Urinary tract infection - 2014 Urinary Urgency, Urinary urgency - 2014 Weak Urinary Stream, Weak urinary stream - 2014      PMH Notes: .   NON-GU PMH: Pyuria/other UA findings - 06/21/2017 Encounter for general adult medical examination without abnormal findings, Encounter for preventive health examination - 2015 Personal history of other diseases of the circulatory system, History of hypertension - 2014 Personal history of other endocrine, nutritional and metabolic disease, History of hypercholesterolemia - 2014 COPD Malignant neoplasm of thyroid gland    FAMILY HISTORY: Family Health Status Number - Runs In Family Hypertension - Runs In Family   SOCIAL HISTORY: Marital Status: Married Preferred Language: English; Ethnicity: Not Hispanic Or Latino Current Smoking Status: Patient does not smoke anymore.  Drinks 2 drinks per day.  Drinks 1 caffeinated drink per day.    REVIEW OF SYSTEMS:    GU Review Male:   Patient reports burning/ pain with urination and get up at night to urinate. Patient denies frequent urination, hard to postpone urination, leakage of urine, stream starts and stops, trouble starting your stream, have to strain to urinate , erection problems, and penile pain.  Gastrointestinal (Upper):   Patient denies nausea, vomiting, and indigestion/ heartburn.  Gastrointestinal (Lower):   Patient denies diarrhea and constipation.  Constitutional:   Patient denies fever, night sweats, weight loss, and fatigue.  Skin:   Patient denies skin rash/ lesion and itching.  Eyes:   Patient denies blurred vision and double vision.  Ears/  Nose/ Throat:   Patient denies sore throat and sinus problems.  Hematologic/Lymphatic:   Patient denies swollen glands and easy bruising.  Cardiovascular:   Patient denies leg swelling and chest pains.  Respiratory:   Patient denies cough and shortness of breath.  Endocrine:   Patient denies excessive thirst.  Musculoskeletal:   Patient denies back pain and joint pain.  Neurological:   Patient denies headaches and dizziness.  Psychologic:   Patient denies depression and anxiety.   VITAL SIGNS:      09/30/2017 09:43 AM  BP 146/69 mmHg  Pulse 79 /min  Temperature 97.3 F / 36.2 C   GU PHYSICAL EXAMINATION:    Scrotum: No lesions. No edema. No cysts. No warts.  Penis: Circumcised, no warts, no cracks. No dorsal Peyronie's plaques, no left corporal Peyronie's plaques, no right corporal Peyronie's plaques, no scarring, no warts. No balanitis, no meatal stenosis.   MULTI-SYSTEM PHYSICAL EXAMINATION:    Constitutional: Well-nourished. No physical deformities. Normally developed. Lawson grooming.  Neck: Neck symmetrical, not swollen. Normal tracheal position.  Respiratory: No labored breathing, no use of accessory muscles.   Cardiovascular: Normal temperature, normal extremity pulses, no swelling, no varicosities.  Skin: No paleness, no jaundice, no cyanosis. No lesion, no ulcer, no rash.  Neurologic / Psychiatric: Oriented to time, oriented to place, oriented to person. No depression, no anxiety, no agitation.  Gastrointestinal: No mass, no tenderness, no rigidity, non obese abdomen.  PAST DATA REVIEWED:  Source Of History:  Patient   09/28/07 09/26/06 11/23/05 06/30/04  PSA  Total PSA 1.24  0.95  1.95  1.01     PROCEDURES:         Flexible Cystoscopy - 52000  Risks, benefits, and some of the potential complications of the procedure were discussed with the patient. All questions were answered. Informed consent was obtained. Antibiotic prophylaxis was given -- Cipro - Sterile technique  and intraurethral analgesia were used. I emptied his bladder with a residual of only 75 cc. He was refilled but urine was a bit cloudy.  Meatus:  Normal size. Normal location. Normal condition.  Urethra:  No strictures.  External Sphincter:  Normal.  Verumontanum:  Normal.  Prostate:  Non-obstructing. No hyperplasia.  Bladder Neck:  Non-obstructing.  Ureteral Orifices:  Normal location. Normal size. Normal shape. Effluxed clear urine.  Bladder:  Severe trabeculation. Erythematous mucosa - right bladder - No tumors. No stones.      The lower urinary tract was carefully examined. The procedure was well-tolerated and without complications. Antibiotic instructions were given. Instructions were given to call the office immediately for bloody urine, difficulty urinating, painful urination, fever, chills, nausea, vomiting or other illness. The patient stated that he understood these instructions and would comply with them.         Urinalysis w/Scope Dipstick Dipstick Cont'd Micro  Color: Yellow Bilirubin: Neg mg/dL WBC/hpf: >60/hpf  Appearance: Cloudy Ketones: Neg mg/dL RBC/hpf: 3 - 10/hpf  Specific Gravity: 1.025 Blood: 1+ ery/uL Bacteria: Rare (0-9/hpf)  pH: 6.0 Protein: 2+ mg/dL Cystals: NS (Not Seen)  Glucose: Neg mg/dL Urobilinogen: 0.2 mg/dL Casts: NS (Not Seen)    Nitrites: Neg Trichomonas: Not Present    Leukocyte Esterase: 3+ leu/uL Mucous: Not Present      Epithelial Cells: NS (Not Seen)      Yeast: NS (Not Seen)      Sperm: Not Present    ASSESSMENT:      ICD-10 Details  1 GU:   Bladder Cancer Lateral - C67.2   2 NON-GU:   Pyuria/other UA findings - R82.79 Stable   PLAN:           Orders Labs Urine Culture          Schedule Return Visit/Planned Activity: Next Available Appointment - Schedule Surgery          Document Letter(s):  Created for Patient: Clinical Summary         Notes:   Right bladder-with the erythema and bullous edema-this area was T1 in March and  concern for a larger tumor progression. Discussed with the patient and his wife the nature risks benefits and alternatives to cystoscopy with TURBT and postop gemcitabine. All questions answered and elects to proceed.   cc: Dr. Philip Aspen     * Signed by Festus Aloe, M.D. on 09/30/17 at 4:25 PM (EDT)*     The information contained in this medical record document is considered private and confidential patient information. This information can only be used for the medical diagnosis and/or medical services that are being provided by the patient's selected caregivers. This information can only be distributed outside of the patient's care if the patient agrees and signs waivers of authorization for this information to be sent to an outside source or route.

## 2017-10-21 ENCOUNTER — Ambulatory Visit (HOSPITAL_COMMUNITY): Payer: Medicare Other | Admitting: Registered Nurse

## 2017-10-21 ENCOUNTER — Encounter (HOSPITAL_COMMUNITY)
Admission: RE | Disposition: A | Discharge status: Home / Self Care | Payer: Self-pay | Source: Ambulatory Visit | Attending: Urology

## 2017-10-21 ENCOUNTER — Ambulatory Visit (HOSPITAL_COMMUNITY)
Admission: RE | Admit: 2017-10-21 | Discharge: 2017-10-21 | Disposition: A | Discharge status: Home / Self Care | Payer: Medicare Other | Source: Ambulatory Visit | Attending: Urology | Admitting: Urology

## 2017-10-21 ENCOUNTER — Encounter (HOSPITAL_COMMUNITY): Payer: Self-pay | Admitting: *Deleted

## 2017-10-21 DIAGNOSIS — E7800 Pure hypercholesterolemia, unspecified: Secondary | ICD-10-CM | POA: Diagnosis not present

## 2017-10-21 DIAGNOSIS — C672 Malignant neoplasm of lateral wall of bladder: Secondary | ICD-10-CM | POA: Insufficient documentation

## 2017-10-21 DIAGNOSIS — Z9981 Dependence on supplemental oxygen: Secondary | ICD-10-CM | POA: Diagnosis not present

## 2017-10-21 DIAGNOSIS — Z888 Allergy status to other drugs, medicaments and biological substances status: Secondary | ICD-10-CM | POA: Diagnosis not present

## 2017-10-21 DIAGNOSIS — K219 Gastro-esophageal reflux disease without esophagitis: Secondary | ICD-10-CM | POA: Insufficient documentation

## 2017-10-21 DIAGNOSIS — Z85850 Personal history of malignant neoplasm of thyroid: Secondary | ICD-10-CM | POA: Insufficient documentation

## 2017-10-21 DIAGNOSIS — Z8551 Personal history of malignant neoplasm of bladder: Secondary | ICD-10-CM | POA: Diagnosis not present

## 2017-10-21 DIAGNOSIS — Z881 Allergy status to other antibiotic agents status: Secondary | ICD-10-CM | POA: Diagnosis not present

## 2017-10-21 DIAGNOSIS — Z79899 Other long term (current) drug therapy: Secondary | ICD-10-CM | POA: Insufficient documentation

## 2017-10-21 DIAGNOSIS — Z87891 Personal history of nicotine dependence: Secondary | ICD-10-CM | POA: Insufficient documentation

## 2017-10-21 DIAGNOSIS — D494 Neoplasm of unspecified behavior of bladder: Secondary | ICD-10-CM | POA: Diagnosis not present

## 2017-10-21 DIAGNOSIS — R300 Dysuria: Secondary | ICD-10-CM | POA: Diagnosis not present

## 2017-10-21 DIAGNOSIS — D090 Carcinoma in situ of bladder: Secondary | ICD-10-CM | POA: Diagnosis not present

## 2017-10-21 DIAGNOSIS — E039 Hypothyroidism, unspecified: Secondary | ICD-10-CM | POA: Diagnosis not present

## 2017-10-21 DIAGNOSIS — D414 Neoplasm of uncertain behavior of bladder: Secondary | ICD-10-CM

## 2017-10-21 DIAGNOSIS — J449 Chronic obstructive pulmonary disease, unspecified: Secondary | ICD-10-CM | POA: Diagnosis not present

## 2017-10-21 DIAGNOSIS — C679 Malignant neoplasm of bladder, unspecified: Secondary | ICD-10-CM | POA: Diagnosis not present

## 2017-10-21 DIAGNOSIS — I10 Essential (primary) hypertension: Secondary | ICD-10-CM | POA: Insufficient documentation

## 2017-10-21 DIAGNOSIS — I2510 Atherosclerotic heart disease of native coronary artery without angina pectoris: Secondary | ICD-10-CM | POA: Diagnosis not present

## 2017-10-21 DIAGNOSIS — M1990 Unspecified osteoarthritis, unspecified site: Secondary | ICD-10-CM | POA: Diagnosis not present

## 2017-10-21 HISTORY — DX: Other specified symptoms and signs involving the circulatory and respiratory systems: R09.89

## 2017-10-21 HISTORY — PX: TRANSURETHRAL RESECTION OF BLADDER TUMOR: SHX2575

## 2017-10-21 HISTORY — DX: Sleep apnea, unspecified: G47.30

## 2017-10-21 HISTORY — DX: Heart disease, unspecified: I51.9

## 2017-10-21 LAB — CBC
HCT: 42.4 % (ref 39.0–52.0)
Hemoglobin: 12.5 g/dL — ABNORMAL LOW (ref 13.0–17.0)
MCH: 25.7 pg — ABNORMAL LOW (ref 26.0–34.0)
MCHC: 29.5 g/dL — AB (ref 30.0–36.0)
MCV: 87.2 fL (ref 80.0–100.0)
PLATELETS: 314 10*3/uL (ref 150–400)
RBC: 4.86 MIL/uL (ref 4.22–5.81)
RDW: 18.9 % — AB (ref 11.5–15.5)
WBC: 6.5 10*3/uL (ref 4.0–10.5)
nRBC: 0 % (ref 0.0–0.2)

## 2017-10-21 LAB — BASIC METABOLIC PANEL
Anion gap: 7 (ref 5–15)
BUN: 33 mg/dL — AB (ref 8–23)
CHLORIDE: 102 mmol/L (ref 98–111)
CO2: 31 mmol/L (ref 22–32)
CREATININE: 1.04 mg/dL (ref 0.61–1.24)
Calcium: 9.1 mg/dL (ref 8.9–10.3)
GFR calc Af Amer: >60 mL/min (ref >60)
GFR calc non Af Amer: >60 mL/min (ref >60)
GLUCOSE: 88 mg/dL (ref 70–99)
Potassium: 4.2 mmol/L (ref 3.5–5.1)
Sodium: 140 mmol/L (ref 135–145)

## 2017-10-21 SURGERY — TURBT (TRANSURETHRAL RESECTION OF BLADDER TUMOR)
Anesthesia: General

## 2017-10-21 MED ORDER — SODIUM CHLORIDE 0.9 % IR SOLN
Status: DC | PRN
  Administered 2017-10-21: 15000 mL via INTRAVESICAL

## 2017-10-21 MED ORDER — DEXAMETHASONE SODIUM PHOSPHATE 10 MG/ML IJ SOLN
INTRAMUSCULAR | Status: AC
  Filled 2017-10-21: qty 1

## 2017-10-21 MED ORDER — METOCLOPRAMIDE HCL 5 MG/ML IJ SOLN: 10.0000 mg | Freq: Once | INTRAMUSCULAR | Status: DC | PRN

## 2017-10-21 MED ORDER — DEXAMETHASONE SODIUM PHOSPHATE 10 MG/ML IJ SOLN
INTRAMUSCULAR | Status: DC | PRN
  Administered 2017-10-21: 10 mg via INTRAVENOUS

## 2017-10-21 MED ORDER — LACTATED RINGERS IV SOLN
INTRAVENOUS | Status: DC
  Administered 2017-10-21: 09:00:00 via INTRAVENOUS

## 2017-10-21 MED ORDER — SODIUM CHLORIDE 0.9 % IV SOLN
2.0000 g | Freq: Once | INTRAVENOUS | Status: AC
  Administered 2017-10-21: 2 g via INTRAVENOUS
  Filled 2017-10-21: qty 2

## 2017-10-21 MED ORDER — PHENAZOPYRIDINE HCL 200 MG PO TABS
200.0000 mg | ORAL_TABLET | Freq: Once | ORAL | Status: AC
  Administered 2017-10-21: 200 mg via ORAL

## 2017-10-21 MED ORDER — FENTANYL CITRATE (PF) 100 MCG/2ML IJ SOLN
INTRAMUSCULAR | Status: AC
  Filled 2017-10-21: qty 2

## 2017-10-21 MED ORDER — EPHEDRINE SULFATE-NACL 50-0.9 MG/10ML-% IV SOSY
PREFILLED_SYRINGE | INTRAVENOUS | Status: DC | PRN
  Administered 2017-10-21: 5 mg via INTRAVENOUS

## 2017-10-21 MED ORDER — LIDOCAINE HCL URETHRAL/MUCOSAL 2 % EX GEL
CUTANEOUS | Status: AC
  Filled 2017-10-21: qty 5

## 2017-10-21 MED ORDER — PROPOFOL 10 MG/ML IV BOLUS
INTRAVENOUS | Status: DC | PRN
  Administered 2017-10-21: 120 mg via INTRAVENOUS

## 2017-10-21 MED ORDER — MEPERIDINE HCL 50 MG/ML IJ SOLN: 6.2500 mg | INTRAMUSCULAR | Status: DC | PRN

## 2017-10-21 MED ORDER — PROPOFOL 10 MG/ML IV BOLUS
INTRAVENOUS | Status: AC
  Filled 2017-10-21: qty 20

## 2017-10-21 MED ORDER — PHENAZOPYRIDINE HCL 100 MG PO TABS: 100.0000 mg | ORAL_TABLET | Freq: Once | ORAL | Status: DC

## 2017-10-21 MED ORDER — FENTANYL CITRATE (PF) 100 MCG/2ML IJ SOLN
25.0000 ug | INTRAMUSCULAR | Status: DC | PRN
  Administered 2017-10-21: 50 ug via INTRAVENOUS

## 2017-10-21 MED ORDER — ONDANSETRON HCL 4 MG/2ML IJ SOLN
INTRAMUSCULAR | Status: DC | PRN
  Administered 2017-10-21: 4 mg via INTRAVENOUS

## 2017-10-21 MED ORDER — EPHEDRINE 5 MG/ML INJ
INTRAVENOUS | Status: AC
  Filled 2017-10-21: qty 10

## 2017-10-21 MED ORDER — FENTANYL CITRATE (PF) 100 MCG/2ML IJ SOLN
INTRAMUSCULAR | Status: DC | PRN
  Administered 2017-10-21 (×3): 25 ug via INTRAVENOUS

## 2017-10-21 MED ORDER — PHENAZOPYRIDINE HCL 200 MG PO TABS
ORAL_TABLET | ORAL | Status: AC
  Filled 2017-10-21: qty 1

## 2017-10-21 MED ORDER — LIDOCAINE 2% (20 MG/ML) 5 ML SYRINGE
INTRAMUSCULAR | Status: AC
  Filled 2017-10-21: qty 5

## 2017-10-21 MED ORDER — GEMCITABINE CHEMO FOR BLADDER INSTILLATION 2000 MG: 2000.0000 mg | Freq: Once | INTRAVENOUS | Status: DC

## 2017-10-21 MED ORDER — LIDOCAINE 2% (20 MG/ML) 5 ML SYRINGE
INTRAMUSCULAR | Status: DC | PRN
  Administered 2017-10-21: 60 mg via INTRAVENOUS

## 2017-10-21 MED ORDER — LIDOCAINE HCL URETHRAL/MUCOSAL 2 % EX GEL
CUTANEOUS | Status: DC | PRN
  Administered 2017-10-21: 1 via URETHRAL

## 2017-10-21 MED ORDER — LEVOFLOXACIN 250 MG PO TABS: 250.0000 mg | ORAL_TABLET | Freq: Every day | ORAL | 0 refills | Status: AC

## 2017-10-21 SURGICAL SUPPLY — 12 items
BAG URINE DRAINAGE (UROLOGICAL SUPPLIES) ×2 IMPLANT
BAG URO CATCHER STRL LF (MISCELLANEOUS) ×2 IMPLANT
CATH FOLEY 2WAY SLVR  5CC 18FR (CATHETERS) ×1
CATH FOLEY 2WAY SLVR 5CC 18FR (CATHETERS) ×1 IMPLANT
GLOVE BIOGEL M STRL SZ7.5 (GLOVE) ×2 IMPLANT
GOWN STRL REUS W/TWL XL LVL3 (GOWN DISPOSABLE) ×2 IMPLANT
LOOP CUT BIPOLAR 24F LRG (ELECTROSURGICAL) ×2 IMPLANT
MANIFOLD NEPTUNE II (INSTRUMENTS) ×2 IMPLANT
PACK CYSTO (CUSTOM PROCEDURE TRAY) ×2 IMPLANT
SYR 10ML LL (SYRINGE) ×2 IMPLANT
TUBING CONNECTING 10 (TUBING) ×2 IMPLANT
TUBING UROLOGY SET (TUBING) ×2 IMPLANT

## 2017-10-21 NOTE — Discharge Instructions (Signed)
Indwelling Urinary Catheter Care, Adult  Take good care of your catheter to keep it working and to prevent problems.  How to wear your catheter  Attach your catheter to your leg with tape (adhesive tape) or a leg strap. Make sure it is not too tight. If you use tape, remove any bits of tape that are already on the catheter.  How to wear a drainage bag  You should have:   A large overnight bag.   A small leg bag.    Overnight Bag  You may wear the overnight bag at any time. Always keep the bag below the level of your bladder but off the floor. When you sleep, put a clean plastic bag in a wastebasket. Then hang the bag inside the wastebasket.  Leg Bag  Never wear the leg bag at night. Always wear the leg bag below your knee. Keep the leg bag secure with a leg strap or tape.  How to care for your skin   Clean the skin around the catheter at least once every day.   Shower every day. Do not take baths.   Put creams, lotions, or ointments on your genital area only as told by your doctor.   Do not use powders, sprays, or lotions on your genital area.  How to clean your catheter and your skin  1. Wash your hands with soap and water.  2. Wet a washcloth in warm water and gentle (mild) soap.  3. Use the washcloth to clean the skin where the catheter enters your body. Clean downward and wipe away from the catheter in small circles. Do not wipe toward the catheter.  4. Pat the area dry with a clean towel. Make sure to clean off all soap.  How to care for your drainage bags  Empty your drainage bag when it is ?- full or at least 2-3 times a day. Replace your drainage bag once a month or sooner if it starts to smell bad or look dirty. Do not clean your drainage bag unless told by your doctor.  Emptying a drainage bag    Supplies Needed   Rubbing alcohol.   Gauze pad or cotton ball.   Tape or a leg strap.    Steps  1. Wash your hands with soap and water.  2. Separate (detach) the bag from your leg.  3. Hold the bag over  the toilet or a clean container. Keep the bag below your hips and bladder. This stops pee (urine) from going back into the tube.  4. Open the pour spout at the bottom of the bag.  5. Empty the pee into the toilet or container. Do not let the pour spout touch any surface.  6. Put rubbing alcohol on a gauze pad or cotton ball.  7. Use the gauze pad or cotton ball to clean the pour spout.  8. Close the pour spout.  9. Attach the bag to your leg with tape or a leg strap.  10. Wash your hands.    Changing a drainage bag  Supplies Needed   Alcohol wipes.   A clean drainage bag.   Adhesive tape or a leg strap.    Steps  1. Wash your hands with soap and water.  2. Separate the dirty bag from your leg.  3. Pinch the rubber catheter with your fingers so that pee does not spill out.  4. Separate the catheter tube from the drainage tube where these tubes connect (at the   connection valve). Do not let the tubes touch any surface.  5. Clean the end of the catheter tube with an alcohol wipe. Use a different alcohol wipe to clean the end of the drainage tube.  6. Connect the catheter tube to the drainage tube of the clean bag.  7. Attach the new bag to the leg with adhesive tape or a leg strap.  8. Wash your hands.    How to prevent infection and other problems   Never pull on your catheter or try to remove it. Pulling can damage tissue in your body.   Always wash your hands before and after touching your catheter.   If a leg strap gets wet, replace it with a dry one.   Drink enough fluids to keep your pee clear or pale yellow, or as told by your doctor.   Do not let the drainage bag or tubing touch the floor.   Wear cotton underwear.   If you are male, wipe from front to back after you poop (have a bowel movement).   Check on the catheter often to make sure it works and the tubing is not twisted.  Get help if:   Your pee is cloudy.   Your pee smells unusually bad.   Your pee is not draining into the bag.   Your  tube gets clogged.   Your catheter starts to leak.   Your bladder feels full.  Get help right away if:   You have redness, swelling, or pain where the catheter enters your body.   You have fluid, pus, or a bad smell coming from the area where the catheter enters your body.   The area where the catheter enters your body feels warm.   You have a fever.   You have pain in your:  ? Stomach (abdomen).  ? Legs.  ? Lower back.  ? Bladder.   You see blood fill the catheter.   Your pee is pink or red.   You feel sick to your stomach (nauseous).   You throw up (vomit).   You have chills.   Your catheter gets pulled out.  This information is not intended to replace advice given to you by your health care provider. Make sure you discuss any questions you have with your health care provider.  Document Released: 04/24/2012 Document Revised: 11/26/2015 Document Reviewed: 06/12/2013  Elsevier Interactive Patient Education  2018 [employer]

## 2017-10-21 NOTE — Anesthesia Preprocedure Evaluation (Signed)
Anesthesia Evaluation  Patient identified by MRN, date of birth, ID band Patient awake    Reviewed: NPO status , Patient's Chart, lab work & pertinent test results  History of Anesthesia Complications Negative for: history of anesthetic complications  Airway Mallampati: I  TM Distance: >3 FB Neck ROM: Limited    Dental  (+) Dental Advisory Given   Pulmonary COPD,  COPD inhaler and oxygen dependent, former smoker,    breath sounds clear to auscultation       Cardiovascular hypertension, Pt. on medications (-) angina+ CAD (CT: CTA noted coronary atherosclerosis and findigs c/w p.HTN)   Rhythm:Regular Rate:Normal  10/18 ECHO; EF 60-65%, valves OK   Neuro/Psych negative neurological ROS     GI/Hepatic Neg liver ROS, GERD  Medicated and Controlled,  Endo/Other  Hypothyroidism H/o thyroid cancer  Renal/GU    Bladder cancer    Musculoskeletal  (+) Arthritis ,   Abdominal   Peds  Hematology negative hematology ROS (+)   Anesthesia Other Findings   Reproductive/Obstetrics                             Anesthesia Physical  Anesthesia Plan  ASA: III  Anesthesia Plan: General   Post-op Pain Management:    Induction: Intravenous  PONV Risk Score and Plan: 3 and Treatment may vary due to age or medical condition, Ondansetron and Dexamethasone  Airway Management Planned: LMA  Additional Equipment:   Intra-op Plan:   Post-operative Plan:   Informed Consent: I have reviewed the patients History and Physical, chart, labs and discussed the procedure including the risks, benefits and alternatives for the proposed anesthesia with the patient or authorized representative who has indicated his/her understanding and acceptance.   Dental advisory given  Plan Discussed with: CRNA and Surgeon  Anesthesia Plan Comments: (Plan routine monitors, GA -LMA OK)        Anesthesia Quick  Evaluation

## 2017-10-21 NOTE — Progress Notes (Signed)
Notified pharmacy we do not need the chemo instillation drug right now. Will pass information to OR nurse that order has already been released and to notify pharmacy when the medication is needed.

## 2017-10-21 NOTE — Progress Notes (Signed)
PACU  Phase II  Spoke to Dr. Junious Silk nurse at the office and ask  for pain medicine prescription to be sent per patient request. She  will work on it. Pt to be discharged.

## 2017-10-21 NOTE — Op Note (Signed)
Preoperative diagnosis: Bladder neoplasm  Postoperative diagnosis: Same  Procedure: TURBT greater than 5 cm  Surgeon: Junious Silk  Anesthesia: General  Indication for procedure: 84 year old with history of bladder cancer who has taken more of a watchful waiting approach.  On recent office cystoscopy he had a masslike appearing area of the right bladder and was brought today for TURBT.  Findings: On exam under anesthesia the prostate was enlarged but smooth without hard areas or nodules, there was no palpable bladder mass on exam or bimanual exam.  The foreskin appeared normal, penis uncircumcised without phimosis.  Scrotum appeared normal.  On cystoscopy there is a large appearing mass that surprisingly encompassed much of the right bladder wall.  There was more CIS appearing erythema of the posterior and dome.  Representative area was biopsied.  When resecting the right bladder wall mass, there appeared to be infiltrative tumor cells in between the muscle bundles.  Description of procedure: After consent was obtained patient brought to the operating room.  After adequate anesthesia he is placed in lithotomy position and prepped and draped in the usual sterile fashion.  A timeout was performed to confirm patient and procedure.  The cystoscope was passed per urethra the bladder carefully inspected.  I then biopsied the posterior wall for a representative portion of the erythema.  I then switched to the resectoscope sheath but needed to dilate the fossa navicularis to 26 [place].  The resectoscope sheath was then passed and the loop was used to cauterize the posterior bladder biopsy.  I then started inferiorly above the right ureteral orifice and resected the tumor.  The mucosa portion of the tumor was quite vascular and more fluffy higher grade appearing infiltrative cells deeper along the muscle.  This was resected in its entirety and there was excellent hemostasis at low pressure.  Most of the bladder  with collapse but this right side and lost some of its compliance and was more fixed.  Again hemostasis was excellent at low pressure.  All the chips were evacuated and the scope removed.  An 77 French Foley catheter was placed in left to gravity drainage.  I did an exam under anesthesia and could not palpate a mass.  I did not feel that the bladder was fixed.  The patient was awakened taken to recovery room in stable condition. Given the infiltrative nature I did not feel the benefits of intravesical chemo outweighed the risks and I held off on it.   Complications: None  Blood loss: Minimal  Specimens: #1 posterior bladder biopsy, #2 right bladder wall resection Specimens were to pathology  Drains: 18 French Foley catheter  Disposition: Patient stable to PACU

## 2017-10-21 NOTE — Anesthesia Postprocedure Evaluation (Signed)
Anesthesia Post Note  Patient: Khoa S Yamashiro Sr.  Procedure(s) Performed: TRANSURETHRAL RESECTION OF BLADDER TUMOR (TURBT) GREATER THAN 5CM (N/A )     Patient location during evaluation: PACU Anesthesia Type: General Level of consciousness: awake and alert Pain management: pain level controlled Vital Signs Assessment: post-procedure vital signs reviewed and stable Respiratory status: spontaneous breathing, nonlabored ventilation, respiratory function stable and patient connected to nasal cannula oxygen Cardiovascular status: blood pressure returned to baseline and stable Postop Assessment: no apparent nausea or vomiting Anesthetic complications: no    Last Vitals:  Vitals:   10/21/17 1245 10/21/17 1340  BP: (!) 170/73 (!) 167/72  Pulse: 63 68  Resp: 14 16  Temp:  (!) 36.4 C  SpO2: 97% 98%    Last Pain:  Vitals:   10/21/17 1340  TempSrc: Oral  PainSc: 4                  Montez Hageman

## 2017-10-21 NOTE — Transfer of Care (Signed)
Immediate Anesthesia Transfer of Care Note  Patient: Jose S Perriello Sr.  Procedure(s) Performed: TRANSURETHRAL RESECTION OF BLADDER TUMOR (TURBT) GREATER THAN 5CM (N/A )  Patient Location: PACU  Anesthesia Type:General  Level of Consciousness: awake, alert , oriented and patient cooperative  Airway & Oxygen Therapy: Patient Spontanous Breathing and Patient connected to face mask oxygen  Post-op Assessment: Report given to RN, Post -op Vital signs reviewed and stable and Patient moving all extremities  Post vital signs: Reviewed and stable  Last Vitals:  Vitals Value Taken Time  BP 160/73 10/21/2017 12:00 PM  Temp    Pulse 65 10/21/2017 12:01 PM  Resp 8 10/21/2017 12:01 PM  SpO2 100 % 10/21/2017 12:01 PM  Vitals shown include unvalidated device data.  Last Pain:  Vitals:   10/21/17 0855  TempSrc:   PainSc: 2          Complications: No apparent anesthesia complications

## 2017-10-21 NOTE — Interval H&P Note (Signed)
History and Physical Interval Note:  10/21/2017 10:18 AM  Jose S Gales Sr.  has presented today for surgery, with the diagnosis of BLADDER CANCER  The various methods of treatment have been discussed with the patient and family. After consideration of risks, benefits and other options for treatment, the patient has consented to  Procedure(s): TRANSURETHRAL RESECTION OF BLADDER TUMOR (TURBT) WITH POST OPERATIVE INSTILLATION OF CHEMOTHERAPY (N/A) as a surgical intervention .  The patient's history has been reviewed, patient examined, no change in status, stable for surgery. No fever. Mild dysuria - chronic. Pt/wife want a catheter for the weekend. I have reviewed the patient's chart and labs.  Questions were answered to the patient's satisfaction.  They elect to proceed.    Festus Aloe

## 2017-10-21 NOTE — Anesthesia Procedure Notes (Signed)
Procedure Name: LMA Insertion Date/Time: 10/21/2017 10:34 AM Performed by: Victoriano Lain, CRNA Pre-anesthesia Checklist: Patient identified, Emergency Drugs available, Patient being monitored, Timeout performed and Suction available Patient Re-evaluated:Patient Re-evaluated prior to induction Oxygen Delivery Method: Circle system utilized Preoxygenation: Pre-oxygenation with 100% oxygen Induction Type: IV induction LMA: LMA with gastric port inserted LMA Size: 4.0 Number of attempts: 1 Placement Confirmation: positive ETCO2 and breath sounds checked- equal and bilateral Tube secured with: Tape Dental Injury: Teeth and Oropharynx as per pre-operative assessment

## 2017-10-22 ENCOUNTER — Encounter (HOSPITAL_COMMUNITY): Payer: Self-pay | Admitting: Urology

## 2017-10-27 DIAGNOSIS — E291 Testicular hypofunction: Secondary | ICD-10-CM | POA: Diagnosis not present

## 2017-11-03 DIAGNOSIS — R300 Dysuria: Secondary | ICD-10-CM | POA: Diagnosis not present

## 2017-11-03 DIAGNOSIS — C672 Malignant neoplasm of lateral wall of bladder: Secondary | ICD-10-CM | POA: Diagnosis not present

## 2017-11-07 ENCOUNTER — Telehealth: Payer: Self-pay | Admitting: Oncology

## 2017-11-07 NOTE — Telephone Encounter (Signed)
Scheduled appt per 10/28 sch message - left message for patient with appt date and time - 

## 2017-11-09 DIAGNOSIS — N281 Cyst of kidney, acquired: Secondary | ICD-10-CM | POA: Diagnosis not present

## 2017-11-09 DIAGNOSIS — C672 Malignant neoplasm of lateral wall of bladder: Secondary | ICD-10-CM | POA: Diagnosis not present

## 2017-11-10 ENCOUNTER — Other Ambulatory Visit: Payer: Self-pay | Admitting: Urology

## 2017-11-10 ENCOUNTER — Telehealth: Payer: Self-pay | Admitting: Oncology

## 2017-11-10 ENCOUNTER — Inpatient Hospital Stay: Payer: Medicare Other | Attending: Oncology | Admitting: Oncology

## 2017-11-10 VITALS — BP 124/70 | HR 87 | Temp 98.1°F | Resp 18 | Ht 76.0 in | Wt 192.2 lb

## 2017-11-10 DIAGNOSIS — C679 Malignant neoplasm of bladder, unspecified: Secondary | ICD-10-CM | POA: Diagnosis not present

## 2017-11-10 DIAGNOSIS — Z79899 Other long term (current) drug therapy: Secondary | ICD-10-CM | POA: Diagnosis not present

## 2017-11-10 NOTE — Telephone Encounter (Signed)
Appts scheduled avs/calendar printed/ referral place through proficient per 10/31 los

## 2017-11-10 NOTE — Progress Notes (Signed)
Hematology and Oncology Follow Up Visit  Jose Lawson [id number] 01-Aug-1925 84 y.o. 11/10/2017 10:46 AM Jose Lawson, MDPaterson, [name], MD   Principle Diagnosis: 84 year old man with bladder cancer diagnosed in 2013.  He had superficial bladder tumor and is up till October 2019 where he developed muscle invasive disease.   Prior Therapy:  He is status post multiple cystoscopy and fulguration in the past.  He also received BCG 2014 and was discontinued because of fevers. He is status post epirubicin in May 2019 for high-grade T1 disease.  Current therapy: Other evaluation for definitive treatment for his muscle invasive bladder cancer.  Interim History: [name] returns today for a follow-up visit.  He is a pleasant gentleman I saw and consultation for bladder cancer that has been superficial in the past under the care of Dr. Junious Silk.  He had multiple therapies including fulguration, BCG and most recently in May 2019 received epirubicin instillations.  His most recent cystoscopy obtained on October 21, 2017 which showed muscle invasive bladder tumor after TURBT.  CT scan obtained on November 09, 2017 showed a possible residual tumor in the bladder but no disease beyond that.  Clinically, he reports some dysuria which she has used Azo for and helped his symptoms slightly.  He denies any hematuria or or frequency.  He remains active maintaining a reasonable quality of life and performance status.  He continues to drive and attends to activities of daily living.   does not report any headaches, blurry vision, syncope or seizures. Does not report any fevers, chills or sweats.  Does not report any cough, wheezing or hemoptysis.  Does not report any chest pain, palpitation, orthopnea or leg edema.  Does not report any nausea, vomiting or abdominal pain.  Does not report any constipation or diarrhea.  Does not report any skeletal complaints.    Does not report frequency, urgency or  hematuria.  Does not report any skin rashes or lesions. Does not report any heat or cold intolerance.  Does not report any lymphadenopathy or petechiae.  Does not report any anxiety or depression.  Remaining review of systems is negative.    Medications: I have reviewed the patient's current medications.  Current Outpatient Medications  Medication Sig Dispense Refill  . atorvastatin (LIPITOR) 20 MG tablet Take 20 mg by mouth daily after breakfast.     . celecoxib (CELEBREX) 200 MG capsule Take 200 mg by mouth 2 (two) times daily.    . iron polysaccharides (NIFEREX) 150 MG capsule Take 150 mg by mouth daily.    [name] levothyroxine (SYNTHROID, LEVOTHROID) 175 MCG tablet Take 175 mcg by mouth daily after breakfast.     . nitroGLYCERIN (NITRODUR - DOSED IN MG/24 HR) 0.1 mg/hr patch Place 0.1 mg onto the skin daily.    . nitroGLYCERIN (NITROSTAT) 0.4 MG SL tablet Place 1 tablet (0.4 mg total) under the tongue every 5 (five) minutes as needed for chest pain. 30 tablet 12  . pantoprazole (PROTONIX) 40 MG tablet Take 1 tablet (40 mg total) by mouth daily. (Patient taking differently: Take 40 mg by mouth daily after breakfast. ) 60 tablet 0  . polyethylene glycol powder (GLYCOLAX/MIRALAX) powder Take 17 g by mouth daily as needed (for constipation).    . STIOLTO RESPIMAT 2.5-2.5 MCG/ACT AERS INHALE 2 PUFFS BY MOUTH ONCE DAILY (Patient taking differently: Inhale 2 puffs into the lungs daily. ) 4 g 5  . testosterone enanthate (DELATESTRYL) 200 MG/ML injection Inject 200 mg into the muscle  every 28 (twenty-eight) days. For IM use only    . triamterene-hydrochlorothiazide (MAXZIDE-25) 37.5-25 MG tablet Take 1 tablet by mouth daily.  0  . vitamin B-12 (CYANOCOBALAMIN) 1000 MCG tablet Take 1,000 mcg by mouth 3 (three) times a week.    . Vitamin D, Ergocalciferol, (DRISDOL) 50000 units CAPS capsule Take 50,000 Units by mouth every Monday.      No current facility-administered medications for this visit.       Allergies: No Known Allergies  Past Medical History, Surgical history, Social history, and Family History were reviewed and updated.  Review of Systems:  Remaining ROS negative.  Physical Exam: Blood pressure 124/70, pulse 87, temperature 98.1 F (36.7 C), temperature source Oral, resp. rate 18, height 6\' 4"  (1.93 m), weight 192 lb 3.2 oz (87.2 kg), SpO2 95 %.   ECOG: 1 General appearance: alert and cooperative appeared without distress. Head: Normocephalic, without obvious abnormality Oropharynx: No oral thrush or ulcers. Eyes: No scleral icterus.  Pupils are equal and round reactive to light. Lymph nodes: Cervical, supraclavicular, and axillary nodes normal. Heart:regular rate and rhythm, S1, S2 normal, no murmur, click, rub or gallop Lung:chest clear, no wheezing, rales, normal symmetric air entry Abdomin: soft, non-tender, without masses or organomegaly. Neurological: No motor, sensory deficits.  Intact deep tendon reflexes. Skin: No rashes or lesions.  No ecchymosis or petechiae. Musculoskeletal: No joint deformity or effusion. Psychiatric: Mood and affect are appropriate.    Lab Results: Lab Results  Component Value Date   WBC 6.5 10/21/2017   HGB 12.5 (L) 10/21/2017   HCT 42.4 10/21/2017   MCV 87.2 10/21/2017   PLT 314 10/21/2017     Chemistry      Component Value Date/Time   NA 140 10/21/2017 0909   K 4.2 10/21/2017 0909   CL 102 10/21/2017 0909   CO2 31 10/21/2017 0909   BUN 33 (H) 10/21/2017 0909   CREATININE 1.04 10/21/2017 0909      Component Value Date/Time   CALCIUM 9.1 10/21/2017 0909   ALKPHOS 74 01/12/2017 0414   AST 18 01/12/2017 0414   ALT 24 01/12/2017 0414   BILITOT 0.6 01/12/2017 0414        Impression and Plan:  84 year old man with:  1.  Bladder cancer diagnosed in 2013.  He has experienced multiple recurrence and since that time and is required fulguration, BCG and most recently epirubicin instillations and 2018.  He  is status post TURBT in October 2019 which showed muscle invasive disease.  The natural course of this disease was reviewed today in detail with the patient and his wife.  Treatment options were discussed which include radical cystectomy which she has deferred at this time due to his age.  Alternatively radiation therapy concomitantly with chemotherapy can offer him a reasonable chance of cure and definite disease control.  The logistics of administration of weekly chemotherapy with radiation was discussed today.  Complications associated with carboplatin chemotherapy include nausea, fatigue, infusion related complications as well as renal insufficiency.  We also discussed the logistics of radiation therapy that will be on a daily basis for at least 5 to 6 weeks.  After discussion today, he is interested in learning about radiation although not sure if he wants to proceed with chemotherapy in addition.  I will set him up with the radiation oncology evaluation in the near future.  He will have a repeat TURBT under the care of Dr. Junious Silk in November 2019 which is already scheduled.  If  he decides on radiation and he is willing to proceed with chemotherapy and will facilitate that at this time.  He will have to attend chemotherapy education class.  2.  IV access: Peripheral veins will be used if he decides on chemotherapy.  3. Follow-up: We will be in the next few weeks to follow his progress depending on his decision.  25  minutes was spent with the patient face-to-face today.  More than 50% of time was dedicated to reviewing his disease status, imaging studies and coordinating plan of care.     Zola Button, MD 10/31/201910:46 AM

## 2017-11-15 DIAGNOSIS — R0602 Shortness of breath: Secondary | ICD-10-CM | POA: Diagnosis not present

## 2017-11-15 DIAGNOSIS — N179 Acute kidney failure, unspecified: Secondary | ICD-10-CM | POA: Diagnosis not present

## 2017-11-15 DIAGNOSIS — J449 Chronic obstructive pulmonary disease, unspecified: Secondary | ICD-10-CM | POA: Diagnosis not present

## 2017-11-15 NOTE — Progress Notes (Signed)
GU Location of Tumor / Histology: Bladder cancer  Jose S Murren Sr. presented with signs/symptoms of:He had multiple therapies including fulguration, BCG and most recently in May 2019 received epirubicin instillations.  His most recent cystoscopy obtained on October 21, 2017 which showed muscle invasive bladder tumor after TURBT.  CT scan obtained on November 09, 2017 showed a possible residual tumor in the bladder but no disease beyond that.   Biopsies revealed: 10/21/17:  1. 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 Bladder, transurethral resection, right wall INFILTRATIVE HIGH GRADE UROTHELIAL CARCINOMA THE CARCINOMA INVADES MUSCULARIS PROPRIA (DETRUSOR MUSCLE)  Past/Anticipated interventions by urology, if any: 11/29/17: TRANSURETHRAL RESECTION OF BLADDER TUMOR (TURBT) Jose Aloe, MD  Past/Anticipated interventions by medical oncology, if any: 11/10/17 Per Dr. Alen Blew:  1.  Bladder cancer diagnosed in 2013.  He has experienced multiple recurrence and since that time and is required fulguration, BCG and most recently epirubicin instillations and 2018.  He is status post TURBT in October 2019 which showed muscle invasive disease.  The natural course of this disease was reviewed today in detail with the patient and his wife.  Treatment options were discussed which include radical cystectomy which she has deferred at this time due to his age.  Alternatively radiation therapy concomitantly with chemotherapy can offer him a reasonable chance of cure and definite disease control.  The logistics of administration of weekly chemotherapy with radiation was discussed today.  Complications associated with carboplatin chemotherapy include nausea, fatigue, infusion related complications as well as renal insufficiency.  We also discussed the logistics of radiation therapy that will be on a daily basis for at least 5 to 6 weeks.  After discussion  today, he is interested in learning about radiation although not sure if he wants to proceed with chemotherapy in addition.  I will set him up with the radiation oncology evaluation in the near future.  He will have a repeat TURBT under the care of Dr. Junious Silk in November 2019 which is already scheduled.  If he decides on radiation and he is willing to proceed with chemotherapy and will facilitate that at this time.  He will have to attend chemotherapy education class.  2.  IV access: Peripheral veins will be used if he decides on chemotherapy.  3. Follow-up: We will be in the next few weeks to follow his progress depending on his decision.  Weight changes, if any: No  Bowel/Bladder complaints, if any: Clinically, he reports some dysuria which he has used Azo for and helped his symptoms slightly.  He denies any hematuria or or frequency. Does not report frequency, urgency or hematuria.  Nausea/Vomiting, if any: Does not report any nausea, vomiting or abdominal pain. Does not report any constipation or diarrhea.  Pain issues, if any:  Pt denies c/o pain.  SAFETY ISSUES:  Prior radiation? No  Pacemaker/ICD? No  Possible current pregnancy? N/A, pt is male  Is the patient on methotrexate? No  Current Complaints / other details:  Pt presents today for initial consult with Dr. Sondra Come for Radiation Oncology. Pt is accompanied by wife. Pt denies c/o pain. Pt reports occasional dysuria, relieved by OTC AZO. Pt scored 0/10 on distress screening.  BP (!) 155/70 (BP Location: Left Arm, Patient Position: Sitting)   Pulse 83   Temp 98 F (36.7 C) (Oral)   Resp 18   Ht 6\' 4"  (1.93 m)   Wt 192 lb 4 oz (87.2 kg)   SpO2  93%   BMI 23.40 kg/m   Wt Readings from Last 3 Encounters:  11/21/17 192 lb 4 oz (87.2 kg)  11/10/17 192 lb 3.2 oz (87.2 kg)  10/21/17 185 lb (83.9 kg)   Loma Sousa, RN BSN

## 2017-11-17 ENCOUNTER — Encounter (HOSPITAL_COMMUNITY): Payer: Self-pay | Admitting: *Deleted

## 2017-11-17 ENCOUNTER — Other Ambulatory Visit: Payer: Self-pay

## 2017-11-21 ENCOUNTER — Ambulatory Visit
Admission: RE | Admit: 2017-11-21 | Discharge: 2017-11-21 | Disposition: A | Discharge status: Home / Self Care | Payer: Medicare Other | Source: Ambulatory Visit | Attending: Radiation Oncology | Admitting: Radiation Oncology

## 2017-11-21 ENCOUNTER — Encounter: Payer: Self-pay | Admitting: Radiation Oncology

## 2017-11-21 ENCOUNTER — Other Ambulatory Visit: Payer: Self-pay

## 2017-11-21 DIAGNOSIS — C672 Malignant neoplasm of lateral wall of bladder: Secondary | ICD-10-CM | POA: Diagnosis not present

## 2017-11-21 DIAGNOSIS — Z85828 Personal history of other malignant neoplasm of skin: Secondary | ICD-10-CM | POA: Diagnosis not present

## 2017-11-21 DIAGNOSIS — C679 Malignant neoplasm of bladder, unspecified: Secondary | ICD-10-CM

## 2017-11-21 DIAGNOSIS — Z79899 Other long term (current) drug therapy: Secondary | ICD-10-CM | POA: Insufficient documentation

## 2017-11-21 DIAGNOSIS — Z87891 Personal history of nicotine dependence: Secondary | ICD-10-CM | POA: Diagnosis not present

## 2017-11-21 DIAGNOSIS — Z9289 Personal history of other medical treatment: Secondary | ICD-10-CM | POA: Diagnosis not present

## 2017-11-21 NOTE — Progress Notes (Signed)
Radiation Oncology         [phone] ________________________________  Initial Outpatient Consultation  Name: Jose LAMOUREAUX Sr. MRN: [id number]  Date: 11/21/2017  DOB: 05-28-1925  OZ:DGUYQIHK, [name], MD  Wyatt Portela, MD   REFERRING PHYSICIAN: Wyatt Portela, MD  DIAGNOSIS: The encounter diagnosis was Cancer of lateral wall of urinary bladder ([place]). Bladder cancer, infiltrative high-grade urothelial carcinoma presenting along the right lateral bladder wall  HISTORY OF PRESENT ILLNESS::Jose S Job Sr. is a 84 y.o. male who is presenting to the office today for evaluation of bladder cancer. He is accompanied by his wife.   He most recently underwent a biopsy of the posterior wall of his bladder on 10/21/17 that showed urothelial carcinoma in situ (cis) involving Brunn nests. A transurethral resection of the right wall of his bladder showed infiltrative high grade urothelial carcinoma, and the carcinoma invading the muscularis propria (detrusor muscle).   He was first diagnosed with bladder cancer about 3 years ago. He has seen Dr. Junious Silk every 4 months previously for treatments. He has had multiple therapies to treat his bladder cancer in the past, including fulguration, BCG and epirucibin instillations. He saw Dr. Alen Blew on 11/10/17 for chemotherapy consideration. Dr. Junious Silk discussed with him the possibility of cystectomy, which he is against.   He has a history of skin cancers and MOHS procedures. He sees Dr. Ronnald Ramp at [facility].  he reports associated bladder urgency, interfering with sleep. He also reports burning with urination that began several months ago. He is taking Azo for his symptoms, which provides some relief. He had some constant pain in his left testicle, for which he received a nerve block, which provided relief. Following his recent bladder biopsy, he reports having brief dysuria which resolved itself. he denies pelvic and low back pain,  headaches, and any other symptoms.    PREVIOUS RADIATION THERAPY: No  PAST MEDICAL HISTORY:  has a past medical history of Arthritis, Bifascicular block, Bladder cancer ([place]), BPH (benign prostatic hyperplasia), Cataract, Chronic constipation, Chronic restrictive lung disease, Complication of anesthesia, COPD mixed type ([place]), Dyspnea, Emphysema of lung ([place]), GERD (gastroesophageal reflux disease), Grade I diastolic dysfunction (74/25/9563), History of basal cell carcinoma excision (BILATERAL ARMS), History of colon polyps (2004), thyroid cancer, Hyperlipidemia, Hypertension, Hypothyroidism, Hypothyroidism, postsurgical, Iron deficiency anemia, Nocturia, On home oxygen therapy, Oxygen deficiency, Pulmonary hyperinflation (10/27/2016), Right bundle branch block, Sigmoid diverticulosis, Sleep apnea, and Wears glasses.    PAST SURGICAL HISTORY: Past Surgical History:  Procedure Laterality Date  . CARDIOVASCULAR STRESS TEST  08-17-2010   dr allred   Low risk nuclear study with prominent inferobasal thinning vs small prior infarct, no ischemia/  normal LV function and wall motion , ef 63%  . CATARACT EXTRACTION W/ INTRAOCULAR LENS  IMPLANT, BILATERAL    . COLONOSCOPY    . COLONOSCOPY WITH PROPOFOL N/A 06/13/2017   Procedure: COLONOSCOPY WITH PROPOFOL;  Surgeon: Ladene Artist, MD;  Location: [facility];  Service: Endoscopy;  Laterality: N/A;  . CYSTOSCOPY W/ RETROGRADES Bilateral 10/08/2014   Procedure: CYSTOSCOPY WITH BILATERAL RETROGRADE PYELOGRAM, BIOPSY, FULGURATION;  Surgeon: Festus Aloe, MD;  Location: [facility];  Service: Urology;  Laterality: Bilateral;  . CYSTOSCOPY W/ RETROGRADES Bilateral 06/18/2016   Procedure: CYSTOSCOPY WITH RETROGRADE PYELOGRAM;  Surgeon: Festus Aloe, MD;  Location: [facility];  Service: Urology;  Laterality: Bilateral;  . CYSTOSCOPY WITH BIOPSY  12/16/2010   Procedure: CYSTOSCOPY WITH BIOPSY;  Surgeon: Molli Hazard, MD;  Location:  [facility];  Service: Urology;  Laterality: N/A;  . CYSTOSCOPY WITH BIOPSY N/A 05/20/2015   Procedure: CYSTOSCOPY WITH BLADDER BIOPSY AND FULGERATION, CYSTOGRAM, BILATERAL RETROGRADES, BILATERAL RENAL WASHINGS;  Surgeon: Festus Aloe, MD;  Location: [facility];  Service: Urology;  Laterality: N/A;  . CYSTOSCOPY WITH BIOPSY N/A 06/18/2016   Procedure: CYSTOSCOPY WITH BIOPSY FULGURATION;  Surgeon: Festus Aloe, MD;  Location: [facility];  Service: Urology;  Laterality: N/A;  . CYSTOSCOPY WITH BIOPSY N/A 05/24/2017   Procedure: CYSTOSCOPY WITH BIOPSY/ FULGURATION 0.5 TO 2CM AND LEFT CORD BLOCK;  Surgeon: Festus Aloe, MD;  Location: [facility];  Service: Urology;  Laterality: N/A;  . CYSTOSCOPY/RETROGRADE/URETEROSCOPY  12/16/2010   Procedure: CYSTOSCOPY/RETROGRADE/URETEROSCOPY;  Surgeon: Molli Hazard, MD;  Location: [facility];  Service: Urology;  Laterality: Right;  bilateral retrograde, right ureteroscopy  . ESOPHAGOGASTRODUODENOSCOPY (EGD) WITH PROPOFOL N/A 06/13/2017   Procedure: ESOPHAGOGASTRODUODENOSCOPY (EGD) WITH PROPOFOL;  Surgeon: Ladene Artist, MD;  Location: [facility];  Service: Endoscopy;  Laterality: N/A;  . EYE SURGERY     attached lens to iris  . GREEN LIGHT LASER TURP (TRANSURETHRAL RESECTION OF PROSTATE  11-02-2011  . HOT HEMOSTASIS N/A 06/13/2017   Procedure: HOT HEMOSTASIS (ARGON PLASMA COAGULATION/BICAP);  Surgeon: Ladene Artist, MD;  Location: [facility];  Service: Endoscopy;  Laterality: N/A;  . KNEE ARTHROSCOPY Left 11/  2016  . POLYPECTOMY    . POLYPECTOMY  06/13/2017   Procedure: POLYPECTOMY;  Surgeon: Ladene Artist, MD;  Location: [facility];  Service: Endoscopy;;  . TOTAL THYROIDECTOMY  1991  . TRANSURETHRAL RESECTION OF BLADDER TUMOR  12/16/2010   Procedure: TRANSURETHRAL RESECTION OF BLADDER TUMOR (TURBT);  Surgeon: Molli Hazard, MD;  Location: [facility];  Service: Urology;   Laterality: N/A;  . TRANSURETHRAL RESECTION OF BLADDER TUMOR N/A 10/21/2017   Procedure: TRANSURETHRAL RESECTION OF BLADDER TUMOR (TURBT) GREATER THAN 5CM;  Surgeon: Festus Aloe, MD;  Location: [facility];  Service: Urology;  Laterality: N/A;    FAMILY HISTORY: family history includes Diabetes in his father; Lung cancer in his brother; Thyroid cancer in his unknown relative.  SOCIAL HISTORY:  reports that he quit smoking about 35 years ago. His smoking use included cigarettes. He has a 70.00 pack-year smoking history. He has never used smokeless tobacco. He reports that he drinks alcohol. He reports that he does not use drugs.  ALLERGIES: Patient has no known allergies.  MEDICATIONS:  Current Outpatient Medications  Medication Sig Dispense Refill  . atorvastatin (LIPITOR) 20 MG tablet Take 20 mg by mouth daily after breakfast.     . iron polysaccharides (NIFEREX) 150 MG capsule Take 150 mg by mouth daily.    [name] levothyroxine (SYNTHROID, LEVOTHROID) 175 MCG tablet Take 175 mcg by mouth daily after breakfast.     . nitroGLYCERIN (NITRODUR - DOSED IN MG/24 HR) 0.1 mg/hr patch Place 0.1 mg onto the skin daily.    . nitroGLYCERIN (NITROSTAT) 0.4 MG SL tablet Place 1 tablet (0.4 mg total) under the tongue every 5 (five) minutes as needed for chest pain. 30 tablet 12  . pantoprazole (PROTONIX) 40 MG tablet Take 1 tablet (40 mg total) by mouth daily. (Patient taking differently: Take 40 mg by mouth daily after breakfast. ) 60 tablet 0  . polyethylene glycol powder (GLYCOLAX/MIRALAX) powder Take 17 g by mouth daily as needed (for constipation).    . STIOLTO RESPIMAT 2.5-2.5 MCG/ACT AERS INHALE 2 PUFFS BY MOUTH ONCE DAILY (Patient taking differently:  Inhale 2 puffs into the lungs daily. ) 4 g 5  . testosterone enanthate (DELATESTRYL) 200 MG/ML injection Inject 200 mg into the muscle every 28 (twenty-eight) days. For IM use only    . triamterene-hydrochlorothiazide (MAXZIDE-25) 37.5-25 MG tablet Take 1  tablet by mouth daily.  0  . vitamin B-12 (CYANOCOBALAMIN) 1000 MCG tablet Take 1,000 mcg by mouth 3 (three) times a week.    . Vitamin D, Ergocalciferol, (DRISDOL) 50000 units CAPS capsule Take 50,000 Units by mouth every Monday.      No current facility-administered medications for this encounter.     REVIEW OF SYSTEMS:  A 10+ POINT REVIEW OF SYSTEMS WAS OBTAINED including neurology, dermatology, psychiatry, cardiac, respiratory, lymph, extremities, GI, GU, musculoskeletal, constitutional, reproductive, HEENT. All pertinent positives are noted in the HPI. All others are negative.    PHYSICAL EXAM:  height is 6\' 4"  (1.93 m) and weight is 192 lb 4 oz (87.2 kg). His oral temperature is 98 F (36.7 C). His blood pressure is 155/70 (abnormal) and his pulse is 83. His respiration is 18 and oxygen saturation is 93%.   General: Alert and oriented, in no acute distress HEENT: Head is normocephalic. Extraocular movements are intact. Oropharynx is clear. Neck: Neck is supple, no palpable cervical or supraclavicular lymphadenopathy. Heart: Regular in rate and rhythm with no murmurs, rubs, or gallops. Chest: Clear to auscultation bilaterally, with no rhonchi, wheezes, or rales. Abdomen: Soft, nontender, nondistended, with no rigidity or guarding. Extremities: No cyanosis or edema. Lymphatics: see Neck Exam Skin: No concerning lesions. Musculoskeletal: symmetric strength and muscle tone throughout. Neurologic: Cranial nerves II through XII are grossly intact. No obvious focalities. Speech is fluent. Coordination is intact. Psychiatric: Judgment and insight are intact. Affect is appropriate. The patient is a normal uncircumcised male. No inguinal adenopathy appreciated. The testicles are soft without masses. Patient does report tenderness with the left testicle  ECOG = 1  0 - Asymptomatic (Fully active, able to carry on all predisease activities without restriction)  1 - Symptomatic but completely  ambulatory (Restricted in physically strenuous activity but ambulatory and able to carry out work of a light or sedentary nature. For example, light housework, office work)  2 - Symptomatic, <50% in bed during the day (Ambulatory and capable of all self care but unable to carry out any work activities. Up and about more than 50% of waking hours)  3 - Symptomatic, >50% in bed, but not bedbound (Capable of only limited self-care, confined to bed or chair 50% or more of waking hours)  4 - Bedbound (Completely disabled. Cannot carry on any self-care. Totally confined to bed or chair)  5 - Death   Eustace Pen MM, Creech RH, Tormey DC, et al. [phone]). "Toxicity and response criteria of the [facility]". [place] Oncol. 5 (6): 649-55  LABORATORY DATA:  Lab Results  Component Value Date   WBC 6.5 10/21/2017   HGB 12.5 (L) 10/21/2017   HCT 42.4 10/21/2017   MCV 87.2 10/21/2017   PLT 314 10/21/2017   NEUTROABS 7.7 05/09/2017   Lab Results  Component Value Date   NA 140 10/21/2017   K 4.2 10/21/2017   CL 102 10/21/2017   CO2 31 10/21/2017   GLUCOSE 88 10/21/2017   CREATININE 1.04 10/21/2017   CALCIUM 9.1 10/21/2017      RADIOGRAPHY: No results found.    IMPRESSION: Muscle-invasive bladder cancer. Most recent CT scan show no evidence of metastatic spread but probable residual  tumor along the right bladder after his most recent transurethral resection. Patient will proceed with TURB under the direction of Dr. Junious Silk later this month.  the patient will then be seen in the post-op setting by medical oncology, radiation oncology, for further discussion of radiosensitizing chemo and pelvic radiation therapy as part of his overall management. At this point he does not want to consider a cysectomy for overall management.    Today, I talked to the patient and his wife about the findings and work-up thus far.  We discussed the natural history of bladder cancer and general  treatment, highlighting the role of radiotherapy in the management.  We discussed the available radiation techniques, and focused on the details of logistics and delivery.  We reviewed the anticipated acute and late sequelae associated with radiation in this setting.  The patient was encouraged to ask questions that I answered to the best of my ability.   PLAN: He will return for further evaluation on November 25th after his TURB.   ------------------------------------------------  Blair Promise, PhD, MD      This document serves as a record of services personally performed by Gery Pray, MD. It was created on his behalf by Mary-Margaret Loma Messing, a trained medical scribe. The creation of this record is based on the scribe's personal observations and the provider's statements to them. This document has been checked and approved by the attending provider.

## 2017-11-29 ENCOUNTER — Ambulatory Visit (HOSPITAL_COMMUNITY)
Admission: RE | Admit: 2017-11-29 | Discharge: 2017-11-29 | Disposition: A | Discharge status: Home / Self Care | Payer: Medicare Other | Source: Ambulatory Visit | Attending: Urology | Admitting: Urology

## 2017-11-29 ENCOUNTER — Ambulatory Visit (HOSPITAL_COMMUNITY): Payer: Medicare Other | Admitting: Anesthesiology

## 2017-11-29 ENCOUNTER — Encounter (HOSPITAL_COMMUNITY)
Admission: RE | Disposition: A | Discharge status: Home / Self Care | Payer: Self-pay | Source: Ambulatory Visit | Attending: Urology

## 2017-11-29 ENCOUNTER — Encounter (HOSPITAL_COMMUNITY): Payer: Self-pay

## 2017-11-29 DIAGNOSIS — K219 Gastro-esophageal reflux disease without esophagitis: Secondary | ICD-10-CM | POA: Insufficient documentation

## 2017-11-29 DIAGNOSIS — G4730 Sleep apnea, unspecified: Secondary | ICD-10-CM | POA: Insufficient documentation

## 2017-11-29 DIAGNOSIS — Z87891 Personal history of nicotine dependence: Secondary | ICD-10-CM | POA: Insufficient documentation

## 2017-11-29 DIAGNOSIS — Z9889 Other specified postprocedural states: Secondary | ICD-10-CM | POA: Insufficient documentation

## 2017-11-29 DIAGNOSIS — I5030 Unspecified diastolic (congestive) heart failure: Secondary | ICD-10-CM | POA: Diagnosis not present

## 2017-11-29 DIAGNOSIS — C679 Malignant neoplasm of bladder, unspecified: Secondary | ICD-10-CM | POA: Diagnosis not present

## 2017-11-29 DIAGNOSIS — M1990 Unspecified osteoarthritis, unspecified site: Secondary | ICD-10-CM | POA: Insufficient documentation

## 2017-11-29 DIAGNOSIS — I110 Hypertensive heart disease with heart failure: Secondary | ICD-10-CM | POA: Insufficient documentation

## 2017-11-29 DIAGNOSIS — E7800 Pure hypercholesterolemia, unspecified: Secondary | ICD-10-CM | POA: Insufficient documentation

## 2017-11-29 DIAGNOSIS — E890 Postprocedural hypothyroidism: Secondary | ICD-10-CM | POA: Diagnosis not present

## 2017-11-29 DIAGNOSIS — Z8249 Family history of ischemic heart disease and other diseases of the circulatory system: Secondary | ICD-10-CM | POA: Insufficient documentation

## 2017-11-29 DIAGNOSIS — G8929 Other chronic pain: Secondary | ICD-10-CM | POA: Insufficient documentation

## 2017-11-29 DIAGNOSIS — I509 Heart failure, unspecified: Secondary | ICD-10-CM | POA: Insufficient documentation

## 2017-11-29 DIAGNOSIS — J449 Chronic obstructive pulmonary disease, unspecified: Secondary | ICD-10-CM | POA: Diagnosis not present

## 2017-11-29 DIAGNOSIS — Z85850 Personal history of malignant neoplasm of thyroid: Secondary | ICD-10-CM | POA: Diagnosis not present

## 2017-11-29 DIAGNOSIS — Z888 Allergy status to other drugs, medicaments and biological substances status: Secondary | ICD-10-CM | POA: Diagnosis not present

## 2017-11-29 DIAGNOSIS — C672 Malignant neoplasm of lateral wall of bladder: Secondary | ICD-10-CM | POA: Diagnosis not present

## 2017-11-29 DIAGNOSIS — Z9981 Dependence on supplemental oxygen: Secondary | ICD-10-CM | POA: Diagnosis not present

## 2017-11-29 DIAGNOSIS — Z79899 Other long term (current) drug therapy: Secondary | ICD-10-CM | POA: Insufficient documentation

## 2017-11-29 DIAGNOSIS — N289 Disorder of kidney and ureter, unspecified: Secondary | ICD-10-CM | POA: Insufficient documentation

## 2017-11-29 DIAGNOSIS — N50812 Left testicular pain: Secondary | ICD-10-CM | POA: Diagnosis not present

## 2017-11-29 HISTORY — PX: TRANSURETHRAL RESECTION OF BLADDER TUMOR: SHX2575

## 2017-11-29 LAB — CBC
HCT: 45.5 % (ref 39.0–52.0)
Hemoglobin: 13.5 g/dL (ref 13.0–17.0)
MCH: 26.9 pg (ref 26.0–34.0)
MCHC: 29.7 g/dL — ABNORMAL LOW (ref 30.0–36.0)
MCV: 90.6 fL (ref 80.0–100.0)
NRBC: 0 % (ref 0.0–0.2)
PLATELETS: 276 10*3/uL (ref 150–400)
RBC: 5.02 MIL/uL (ref 4.22–5.81)
RDW: 18 % — AB (ref 11.5–15.5)
WBC: 7.5 10*3/uL (ref 4.0–10.5)

## 2017-11-29 LAB — BASIC METABOLIC PANEL
ANION GAP: 8 (ref 5–15)
BUN: 30 mg/dL — ABNORMAL HIGH (ref 8–23)
CALCIUM: 9.3 mg/dL (ref 8.9–10.3)
CHLORIDE: 103 mmol/L (ref 98–111)
CO2: 32 mmol/L (ref 22–32)
Creatinine, Ser: 1.08 mg/dL (ref 0.61–1.24)
GFR calc non Af Amer: 58 mL/min — ABNORMAL LOW (ref >60)
GLUCOSE: 96 mg/dL (ref 70–99)
POTASSIUM: 4.1 mmol/L (ref 3.5–5.1)
Sodium: 143 mmol/L (ref 135–145)

## 2017-11-29 SURGERY — TURBT (TRANSURETHRAL RESECTION OF BLADDER TUMOR)
Anesthesia: General | Laterality: Left

## 2017-11-29 MED ORDER — SODIUM CHLORIDE 0.9 % IV SOLN
2.0000 g | Freq: Once | INTRAVENOUS | Status: AC
  Administered 2017-11-29: 2 g via INTRAVENOUS
  Filled 2017-11-29: qty 2

## 2017-11-29 MED ORDER — LACTATED RINGERS IV SOLN
INTRAVENOUS | Status: DC
  Administered 2017-11-29: 11:00:00 via INTRAVENOUS

## 2017-11-29 MED ORDER — LIDOCAINE HCL (CARDIAC) PF 100 MG/5ML IV SOSY
PREFILLED_SYRINGE | INTRAVENOUS | Status: DC | PRN
  Administered 2017-11-29: 60 mg via INTRAVENOUS

## 2017-11-29 MED ORDER — BUPIVACAINE HCL (PF) 0.5 % IJ SOLN
INTRAMUSCULAR | Status: AC
  Filled 2017-11-29: qty 30

## 2017-11-29 MED ORDER — FENTANYL CITRATE (PF) 100 MCG/2ML IJ SOLN
INTRAMUSCULAR | Status: DC | PRN
  Administered 2017-11-29 (×4): 25 ug via INTRAVENOUS

## 2017-11-29 MED ORDER — FENTANYL CITRATE (PF) 100 MCG/2ML IJ SOLN
INTRAMUSCULAR | Status: AC
  Filled 2017-11-29: qty 2

## 2017-11-29 MED ORDER — FENTANYL CITRATE (PF) 100 MCG/2ML IJ SOLN
INTRAMUSCULAR | Status: AC
  Filled 2017-11-29: qty 4

## 2017-11-29 MED ORDER — PROPOFOL 10 MG/ML IV BOLUS
INTRAVENOUS | Status: AC
  Filled 2017-11-29: qty 20

## 2017-11-29 MED ORDER — ONDANSETRON HCL 4 MG/2ML IJ SOLN
INTRAMUSCULAR | Status: DC | PRN
  Administered 2017-11-29: 4 mg via INTRAVENOUS

## 2017-11-29 MED ORDER — OXYCODONE-ACETAMINOPHEN 5-325 MG PO TABS
1.0000 | ORAL_TABLET | Freq: Once | ORAL | Status: AC
  Administered 2017-11-29: 1 via ORAL

## 2017-11-29 MED ORDER — LIDOCAINE HCL URETHRAL/MUCOSAL 2 % EX GEL
CUTANEOUS | Status: AC
  Filled 2017-11-29: qty 5

## 2017-11-29 MED ORDER — PROPOFOL 10 MG/ML IV BOLUS
INTRAVENOUS | Status: DC | PRN
  Administered 2017-11-29: 120 mg via INTRAVENOUS

## 2017-11-29 MED ORDER — CIPROFLOXACIN HCL 500 MG PO TABS: 500.0000 mg | ORAL_TABLET | Freq: Every day | ORAL | 0 refills | Status: AC

## 2017-11-29 MED ORDER — BUPIVACAINE HCL (PF) 0.5 % IJ SOLN
INTRAMUSCULAR | Status: DC | PRN
  Administered 2017-11-29: 5 mL

## 2017-11-29 MED ORDER — FENTANYL CITRATE (PF) 100 MCG/2ML IJ SOLN
25.0000 ug | INTRAMUSCULAR | Status: DC | PRN
  Administered 2017-11-29 (×3): 50 ug via INTRAVENOUS

## 2017-11-29 MED ORDER — STERILE WATER FOR IRRIGATION IR SOLN
Status: DC | PRN
  Administered 2017-11-29: 250 mL

## 2017-11-29 MED ORDER — LIDOCAINE HCL URETHRAL/MUCOSAL 2 % EX GEL
CUTANEOUS | Status: DC | PRN
  Administered 2017-11-29: 1

## 2017-11-29 MED ORDER — 0.9 % SODIUM CHLORIDE (POUR BTL) OPTIME
TOPICAL | Status: DC | PRN
  Administered 2017-11-29: 1000 mL

## 2017-11-29 MED ORDER — SODIUM CHLORIDE 0.9 % IR SOLN
Status: DC | PRN
  Administered 2017-11-29: 9000 mL

## 2017-11-29 MED ORDER — EPHEDRINE SULFATE 50 MG/ML IJ SOLN
INTRAMUSCULAR | Status: DC | PRN
  Administered 2017-11-29 (×3): 5 mg via INTRAVENOUS

## 2017-11-29 MED ORDER — LIDOCAINE HCL (PF) 1 % IJ SOLN
INTRAMUSCULAR | Status: AC
  Filled 2017-11-29: qty 30

## 2017-11-29 MED ORDER — OXYCODONE-ACETAMINOPHEN 5-325 MG PO TABS: 1.0000 | ORAL_TABLET | Freq: Four times a day (QID) | ORAL | 0 refills | Status: DC | PRN

## 2017-11-29 MED ORDER — OXYCODONE-ACETAMINOPHEN 5-325 MG PO TABS
ORAL_TABLET | ORAL | Status: AC
  Filled 2017-11-29: qty 1

## 2017-11-29 MED ORDER — LIDOCAINE HCL (PF) 1 % IJ SOLN
INTRAMUSCULAR | Status: DC | PRN
  Administered 2017-11-29: 5 mL

## 2017-11-29 SURGICAL SUPPLY — 14 items
BAG URINE DRAINAGE (UROLOGICAL SUPPLIES) ×2 IMPLANT
BAG URO CATCHER STRL LF (MISCELLANEOUS) ×2 IMPLANT
CATH FOLEY 2WAY SLVR  5CC 18FR (CATHETERS) ×1
CATH FOLEY 2WAY SLVR 5CC 18FR (CATHETERS) ×1 IMPLANT
COVER WAND RF STERILE (DRAPES) IMPLANT
GLOVE BIOGEL M STRL SZ7.5 (GLOVE) ×2 IMPLANT
GOWN STRL REUS W/TWL XL LVL3 (GOWN DISPOSABLE) ×2 IMPLANT
LOOP CUT BIPOLAR 24F LRG (ELECTROSURGICAL) IMPLANT
MANIFOLD NEPTUNE II (INSTRUMENTS) ×2 IMPLANT
NEEDLE HYPO 22GX1.5 SAFETY (NEEDLE) ×2 IMPLANT
PACK CYSTO (CUSTOM PROCEDURE TRAY) ×2 IMPLANT
SYR CONTROL 10ML LL (SYRINGE) ×2 IMPLANT
TUBING CONNECTING 10 (TUBING) ×2 IMPLANT
TUBING UROLOGY SET (TUBING) ×2 IMPLANT

## 2017-11-29 NOTE — Interval H&P Note (Signed)
History and Physical Interval Note:  11/29/2017 1:20 PM  Jose S Pitsenbarger Sr.  has presented today for surgery, with the diagnosis of BLADDER CANCER  The various methods of treatment have been discussed with the patient and family. After consideration of risks, benefits and other options for treatment, the patient has consented to  Procedure(s): TRANSURETHRAL RESECTION OF BLADDER TUMOR (TURBT) (N/A) as a surgical intervention .  The patient's history has been reviewed, patient examined, no change in status, stable for surgery.  I have reviewed the patient's chart and labs.  Questions were answered to the patient's satisfaction.  CT scan without mets - Nov 09, 2017. Pt c/o chronic dysuria and left testicle pain. He requested a post-op abx (cipro) and a left chord block. He is otherwise doing well.    Festus Aloe

## 2017-11-29 NOTE — Transfer of Care (Signed)
Immediate Anesthesia Transfer of Care Note  Patient: Jose Burly Charpentier Sr.  Procedure(s) Performed: TRANSURETHRAL RESECTION OF BLADDER TUMOR (TURBT), Left Testicle Cord Block (Left )  Patient Location: PACU  Anesthesia Type:General  Level of Consciousness: awake, alert , oriented and patient cooperative  Airway & Oxygen Therapy: Patient Spontanous Breathing and Patient connected to face mask oxygen  Post-op Assessment: Report given to RN and Post -op Vital signs reviewed and stable  Post vital signs: Reviewed and stable  Last Vitals:  Vitals Value Taken Time  BP 165/76 11/29/2017  3:04 PM  Temp 36.7 C 11/29/2017  3:03 PM  Pulse 86 11/29/2017  3:09 PM  Resp 18 11/29/2017  3:09 PM  SpO2 100 % 11/29/2017  3:09 PM  Vitals shown include unvalidated device data.  Last Pain:  Vitals:   11/29/17 1046  TempSrc:   PainSc: 6       Patients Stated Pain Goal: 5 ([id number])  Complications: No apparent anesthesia complications

## 2017-11-29 NOTE — Op Note (Signed)
Preoperative diagnosis: Muscle invasive bladder cancer, chronic left testicular pain Postoperative diagnosis: Same  Procedure: Left spermatic cord block, TURBT 2 to 5 cm  Surgeon: Junious Silk  Anesthesia: General  Indication for procedure: 84 year old white male with history of bladder cancer.  He recently progressed to T2 muscle invasive bladder cancer on the right lateral wall.  Prior to beginning chemoradiation he was brought back today for maximal TUR.  He also had chronic left testicular pain and requested repeat spermatic cord block.  Findings: On exam the penis was circumcised and without mass or lesion.  Testicles descended bilaterally and palpably normal.  On cystoscopy the right lateral wall had some residual tumor up on the right superior lateral wall.  This was debulked and an oval of about 3 to 4 cm.  Ureteral orifice ease appeared normal with clear reflux.  Description of procedure: After consent was obtained patient brought to the operating room.  After adequate anesthesia was placed in lithotomy position and prepped and draped in the usual sterile fashion.  A timeout was performed to confirm the patient and procedure.  With a mixture of lidocaine and Marcaine 50-50 without epinephrine I grasped the left cord and put about a third of it along the left vas deferens, a third in the middle of the cord and the third to the lateral portion of the cord.  Pressure was held.  He tolerated that well without obvious bleeding or hematoma.  I then placed cystoscope per urethra inspected the bladder.  Some residual tumor was noted right superior.  Overall smaller area required resection than last time.  The continuous flow sheath was passed with the visual obturator.  He had a slight stricture of the fossa navicularis the scope dilated.  I then switched out for the resectoscope loop and handle.  I then resected the right residual bladder tumor in the edges.  Hemostasis was excellent at low pressure.   The chips were sent to pathology.  The scope was removed.  I instilled lidocaine jelly per urethra.  An 4 French catheter was placed in left to gravity drainage.  Urine was clear.  He was awakened taken to recovery room in stable condition.   Complications: None  Specimens to pathology: Right bladder wall tumor resection  Drains: 18 French Foley  Blood loss: Minimal  Disposition: Patient stable to PACU

## 2017-11-29 NOTE — Anesthesia Preprocedure Evaluation (Addendum)
Anesthesia Evaluation  Patient identified by MRN, date of birth, ID band Patient awake    Reviewed: Allergy & Precautions, NPO status , Patient's Chart, lab work & pertinent test results  Airway Mallampati: III  TM Distance: >3 FB Neck ROM: Full    Dental  (+) Teeth Intact, Dental Advisory Given   Pulmonary sleep apnea , COPD, former smoker,    breath sounds clear to auscultation       Cardiovascular hypertension, Pt. on medications +CHF  + dysrhythmias  Rhythm:Regular Rate:Normal     Neuro/Psych negative neurological ROS     GI/Hepatic Neg liver ROS, GERD  Medicated,  Endo/Other  Hypothyroidism   Renal/GU Renal disease     Musculoskeletal  (+) Arthritis ,   Abdominal Normal abdominal exam  (+)   Peds  Hematology negative hematology ROS (+)   Anesthesia Other Findings   Reproductive/Obstetrics                            Anesthesia Physical Anesthesia Plan  ASA: III  Anesthesia Plan: General   Post-op Pain Management:    Induction: Intravenous  PONV Risk Score and Plan: 3 and Ondansetron, Dexamethasone and Midazolam  Airway Management Planned: LMA and Oral ETT  Additional Equipment: None  Intra-op Plan:   Post-operative Plan: Extubation in OR  Informed Consent: I have reviewed the patients History and Physical, chart, labs and discussed the procedure including the risks, benefits and alternatives for the proposed anesthesia with the patient or authorized representative who has indicated his/her understanding and acceptance.   Dental advisory given  Plan Discussed with: CRNA  Anesthesia Plan Comments:        Anesthesia Quick Evaluation

## 2017-11-29 NOTE — H&P (Signed)
Office Visit Report     11/03/2017   --------------------------------------------------------------------------------   Jose Lawson  MRN: [id number]  PRIMARY CARE:  Ermalene Searing. Philip Aspen, MD  DOB: Jun 11, 1925, 84 year old Male  REFERRING:  Jose Dover, MD  SSN: -[id number]  PROVIDER:  Festus Aloe, M.D.    LOCATION:  [facility] [phone]   --------------------------------------------------------------------------------   CC: I have bladder cancer.  HPI: Jose Lawson is a 84 year-old male established patient who is here for bladder cancer.  His bladder cancer was treated by removal with scope. Patient denies removal of the entire bladder, radiation, and chemotherapy.   His last cysto was 10/21/2017.   His last U/S or CT Scan was 06/18/2016.   He had CIS for years and then developed high-grade T1 in 2019 and then HG T2 disease October 2019.  Biopsy hx:  -Dec 2012 LG Ta and CIS.  -September 2016 left posterior CIS recurrence. Biopsied and fulgurated.  -May 2017 - focal CIS - left post and dome - fulgurated  -Jun 2018 cysto, bbx - post and dome CIS; bilateral washes (and RGP (normal)  -May 2019 multifocal HG T1 and two areas suspicous for T1 + epirubicin PACU  -Oct 2019 - HG T2 right bladder   Last upper tract imaging:  Jun 2018 bilateral RGP's / washes  May 2017 bilateral RGP's / washes - negative  May 2015 - CT abdomen and pelvis   Last BCG: Feb 2014 - stopped due to BCG fever (fever 103 after BCG 1/2 dose. Urine & blood cultures negative). Jan 2013 completed 3 doses- had Enterococcus infection and subsequent left epididymitis. Had BCG reaction after #4 resulting in severe LUTS.   He saw Dr. Alen Blew and decided against mitomycin-C installations.   H/o chronic left testicular pain. He had spermatic cord block done May 2019 during a bladder biopsy and prior cord botox with Dr. Odis Luster.   He returns from TURBT October 2019. This revealed high-grade  muscle invasive urothelial carcinoma. Adequate growing cancer on the right bladder wall. He had posterior CIS in a fairly large area. He's well today. He does have some mild dysuria and developed gross hematuria last night which cleared but then he had another episode this morning. He is voiding without difficulty. His October 2019 BUN was 33, creatinine 1.04 with a GFR of 60. Calcium 9.1. Hemoglobin 12.5 with hematocrit 42.4. PSA was 0.87 and 2018.     ALLERGIES: Doxycycline Hyclate CAPS - Other Reaction, unknown Tamsulosin - Dizziness    MEDICATIONS: Levothyroxine Sodium 175 mcg tablet  Atorvastatin Calcium 20 MG Oral Tablet Oral  Nitroglycerin 0.4 mg tablet, sublingual  Oxygen 1 PO Daily As Directed  Pantoprazole Sodium 40 mg tablet, delayed release Oral  Polysaccharide Iron 150  Stiolto Respimat 2.5 mcg-2.5 mcg/actuation mist inhaler  Testosterone Enanthate  Tramadol Hcl 50 mg tablet 1 tablet PO Q 8 H PRN  Triamterene-Hydrochlorothiazid 37.5 mg-25 mg capsule Oral  Vitamin B12  Vitamin D3     Notes: Phoned in Tramadol to pharm--spoke to Edi   GU PSH: Bladder Instill AntiCA Agent - 05/24/2017 Catheterization For Collection Of Specimen, Single Patient, All Places Of Service - 2017 Catheterize For Residual - 2017 Cysto Bladder Ureth Biopsy - 2012 Cysto Uretero Biopsy Fulgura - 06/18/2016 Cystoscopy - 09/30/2017, 05/11/2017, 01/06/2017, 09/28/2016, 05/17/2016, 11/12/2015, 2017 Cystoscopy TURBT <2 cm - 05/24/2017, 06/18/2016, 2017 Cystoscopy TURBT >5 cm - 10/21/2017 Cystoscopy TURBT 2-5 cm - 2016 Cystoscopy Ureteroscopy - 2012 Laser Surgery Prostate -  2013 N Block Inj Ilio-ing/hypogi - 11/12/2015    NON-GU PSH: Knee Arthroscopy; Dx - 2017 Remove Thyroid - 2010    GU PMH: Bladder Cancer Lateral - 09/30/2017, - 09/13/2017, - 08/24/2017, - 05/11/2017, - 01/06/2017, - 09/28/2016, Malignant neoplasm of lateral wall of urinary bladder, - 2016 Chronic cystitis (w/o hematuria) - 03/28/2017, -  02/25/2017 Acute Cystitis/UTI - 03/14/2017, - 01/25/2017 BPH w/LUTS - 01/27/2017 Nocturia - 01/27/2017 Urinary Frequency (Stable) - 01/27/2017, Increased urinary frequency, - 2015 Left testicular pain - 09/28/2016 Epididymitis, Left - 08/27/2016, Epididymitis, left, - 2015 Bladder Cancer overlapping sites - 06/29/2016, Malignant neoplasm of overlapping sites of bladder, - 2016 Dysuria - 06/29/2016, (Worsening, Chronic), - 2017, - 2017, - 2017, Dysuria, - 2017 Bladder Cancer Posterior - 05/17/2016, - 11/12/2015, - 2017 Disorder of male genital organs, unspecified - 05/17/2016, - 11/12/2015 Abdominal Pain Unspec - 2017 Balanitis, Balanitis - 2017 CIS of the bladder, CIS (carcinoma in situ of bladder) - 2017 BPH w/o LUTS, BPH without urinary obstruction - 2016 Testicular pain, unspecified, Testicle pain - 2015 Urinary Retention, Unspec, Incomplete bladder emptying - 2015 Primary hypogonadism, Hypogonadism, testicular - 2015 History of bladder cancer, History of bladder cancer - 2015 Urinary Tract Inf, Unspec site, Urinary tract infection - 2014 Urinary Urgency, Urinary urgency - 2014 Weak Urinary Stream, Weak urinary stream - 2014      PMH Notes: .   NON-GU PMH: Pyuria/other UA findings (Stable) - 09/30/2017, - 06/21/2017 Encounter for general adult medical examination without abnormal findings, Encounter for preventive health examination - 2015 Personal history of other diseases of the circulatory system, History of hypertension - 2014 Personal history of other endocrine, nutritional and metabolic disease, History of hypercholesterolemia - 2014 COPD Malignant neoplasm of thyroid gland    FAMILY HISTORY: Family Health Status Number - Runs In Family Hypertension - Runs In Family   SOCIAL HISTORY: Marital Status: Married Preferred Language: English; Ethnicity: Not Hispanic Or Latino Current Smoking Status: Patient does not smoke anymore.  Drinks 2 drinks per day.  Drinks 1 caffeinated drink per  day.    REVIEW OF SYSTEMS:    GU Review Male:   Patient denies frequent urination, hard to postpone urination, burning/ pain with urination, get up at night to urinate, leakage of urine, stream starts and stops, trouble starting your stream, have to strain to urinate , erection problems, and penile pain.  Gastrointestinal (Upper):   Patient denies nausea, vomiting, and indigestion/ heartburn.  Gastrointestinal (Lower):   Patient denies diarrhea and constipation.  Constitutional:   Patient denies fever, night sweats, weight loss, and fatigue.  Skin:   Patient denies itching and skin rash/ lesion.  Eyes:   Patient denies blurred vision and double vision.  Ears/ Nose/ Throat:   Patient denies sore throat and sinus problems.  Hematologic/Lymphatic:   Patient denies swollen glands and easy bruising.  Cardiovascular:   Patient denies leg swelling and chest pains.  Respiratory:   Patient denies cough and shortness of breath.  Endocrine:   Patient denies excessive thirst.  Musculoskeletal:   Patient denies back pain and joint pain.  Neurological:   Patient denies headaches and dizziness.  Psychologic:   Patient denies depression and anxiety.   VITAL SIGNS:      11/03/2017 08:14 AM  BP 143/67 mmHg  Pulse 73 /min  Temperature 97.1 F / 36.1 C   MULTI-SYSTEM PHYSICAL EXAMINATION:    Constitutional: Well-nourished. No physical deformities. Normally developed. Lawson grooming.  Neck: Neck symmetrical, not swollen. Normal tracheal  position.  Respiratory: No labored breathing, no use of accessory muscles.   Cardiovascular: Normal temperature, normal extremity pulses, no swelling, no varicosities.  Skin: No paleness, no jaundice, no cyanosis. No lesion, no ulcer, no rash.  Neurologic / Psychiatric: Oriented to time, oriented to place, oriented to person. No depression, no anxiety, no agitation.  Gastrointestinal: No mass, no tenderness, no rigidity, non obese abdomen.     PAST DATA REVIEWED:  Source  Of History:  Patient  Lab Test Review:   PSA, CBC with Diff, CMP  Records Review:   POC Tool  X-Ray Review: C.T. Chest: Reviewed Films. 05/2017    09/28/07 09/26/06 11/23/05 06/30/04  PSA  Total PSA 1.24  0.95  1.95  1.01     PROCEDURES:          Urinalysis w/Scope Dipstick Dipstick Cont'd Micro  Color: Amber Bilirubin: Neg mg/dL WBC/hpf: >60/hpf  Appearance: Cloudy Ketones: Neg mg/dL RBC/hpf: >60/hpf  Specific Gravity: 1.015 Blood: 3+ ery/uL Bacteria: Rare (0-9/hpf)  pH: 6.0 Protein: 1+ mg/dL Cystals: NS (Not Seen)  Glucose: Neg mg/dL Urobilinogen: 0.2 mg/dL Casts: NS (Not Seen)    Nitrites: Neg Trichomonas: Not Present    Leukocyte Esterase: 3+ leu/uL Mucous: Not Present      Epithelial Cells: 0 - 5/hpf      Yeast: NS (Not Seen)      Sperm: Not Present    Notes: CLUMPS NOTED TRANSITIONAL EPIS NOTED    ASSESSMENT:      ICD-10 Details  1 GU:   Bladder Cancer Lateral - C67.2   2   Dysuria - R30.0    PLAN:            Medications New Meds: Doxycycline Hyclate 100 mg capsule 1 capsule PO BID   #10  0 Refill(s)            Orders Labs Urine Culture  X-Ray Notes: . History: bladder ca Hematuria: Yes/No   Patient to see MD after exam:/No   Previous exam: CT   When:   Where:   Diabetic No   BUN/ Creatinine:   Date of last BUN Creatinine: bun .. cr 1.0 gfr 60 10/21/17  Weight in pounds: 185  Allergy- IV Contrast: No   Conflicting diabetic meds:/ No   Diabetic Meds:   Prior Authorization #: [id number] valid from 10/24 thru 12/02/17          Schedule X-Rays: 1 Week - C.T. Hematuria With and Without I.V. Contrast  Return Visit/Planned Activity: 5 Weeks - Schedule Surgery          Document Letter(s):  Created for Patient: Clinical Summary         Notes:   After long discussion with the patient and his wife using the understanding bladder cancer booklet on the nature risk and benefits of surveillance alone, TUR line, TUR plus chemotherapy and  radiation, radiation alone and radical cystoprostatectomy with ileal conduit. All questions answered. He definitely doesn't want his bladder removed. I'm going to set up a CT scan of the abdomen and pelvis for staging and have him see Dr. she died Alen Blew). We'll plan to go back to the OR .       Next Appointment:      Next Appointment: 11/09/2017 10:45 AM    Appointment Type: CT Scan    Location: [facility] [phone]    Provider: CT CT    Reason for Visit: Ct hematuria/ bladder ca/ Jarris Kortz      *  Signed by Festus Aloe, M.D. on 11/03/17 at 5:40 PM (EDT)*     The information contained in this medical record document is considered private and confidential patient information. This information can only be used for the medical diagnosis and/or medical services that are being provided by the patient's selected caregivers. This information can only be distributed outside of the patient's care if the patient agrees and signs waivers of authorization for this information to be sent to an outside source or route.

## 2017-11-29 NOTE — Anesthesia Procedure Notes (Signed)
Procedure Name: LMA Insertion Date/Time: 11/29/2017 2:14 PM Performed by: Deliah Boston, CRNA Pre-anesthesia Checklist: Patient identified, Emergency Drugs available, Suction available and Patient being monitored Patient Re-evaluated:Patient Re-evaluated prior to induction Oxygen Delivery Method: Circle system utilized Preoxygenation: Pre-oxygenation with 100% oxygen Induction Type: IV induction Ventilation: Mask ventilation without difficulty LMA: LMA inserted LMA Size: 4.0 Number of attempts: 1 Placement Confirmation: positive ETCO2 and breath sounds checked- equal and bilateral Tube secured with: Tape Dental Injury: Teeth and Oropharynx as per pre-operative assessment

## 2017-11-29 NOTE — Discharge Instructions (Signed)
Indwelling Urinary Catheter Care, Adult Take good care of your catheter to keep it working and to prevent problems. REMOVE FOLEY -- Sunday morning, 12/04/2017  How to wear your catheter Attach your catheter to your leg with tape (adhesive tape) or a leg strap. Make sure it is not too tight. If you use tape, remove any bits of tape that are already on the catheter. How to wear a drainage bag You should have:  A large overnight bag.  A small leg bag.  Overnight Bag You may wear the overnight bag at any time. Always keep the bag below the level of your bladder but off the floor. When you sleep, put a clean plastic bag in a wastebasket. Then hang the bag inside the wastebasket. Leg Bag Never wear the leg bag at night. Always wear the leg bag below your knee. Keep the leg bag secure with a leg strap or tape. How to care for your skin  Clean the skin around the catheter at least once every day.  Shower every day. Do not take baths.  Put creams, lotions, or ointments on your genital area only as told by your doctor.  Do not use powders, sprays, or lotions on your genital area. How to clean your catheter and your skin 1. Wash your hands with soap and water. 2. Wet a washcloth in warm water and gentle (mild) soap. 3. Use the washcloth to clean the skin where the catheter enters your body. Clean downward and wipe away from the catheter in small circles. Do not wipe toward the catheter. 4. Pat the area dry with a clean towel. Make sure to clean off all soap. How to care for your drainage bags Empty your drainage bag when it is ?- full or at least 2-3 times a day. Replace your drainage bag once a month or sooner if it starts to smell bad or look dirty. Do not clean your drainage bag unless told by your doctor. Emptying a drainage bag  Supplies Needed  Rubbing alcohol.  Gauze pad or cotton ball.  Tape or a leg strap.  Steps 1. Wash your hands with soap and water. 2. Separate (detach)  the bag from your leg. 3. Hold the bag over the toilet or a clean container. Keep the bag below your hips and bladder. This stops pee (urine) from going back into the tube. 4. Open the pour spout at the bottom of the bag. 5. Empty the pee into the toilet or container. Do not let the pour spout touch any surface. 6. Put rubbing alcohol on a gauze pad or cotton ball. 7. Use the gauze pad or cotton ball to clean the pour spout. 8. Close the pour spout. 9. Attach the bag to your leg with tape or a leg strap. 10. Wash your hands.  Changing a drainage bag Supplies Needed  Alcohol wipes.  A clean drainage bag.  Adhesive tape or a leg strap.  Steps 1. Wash your hands with soap and water. 2. Separate the dirty bag from your leg. 3. Pinch the rubber catheter with your fingers so that pee does not spill out. 4. Separate the catheter tube from the drainage tube where these tubes connect (at the connection valve). Do not let the tubes touch any surface. 5. Clean the end of the catheter tube with an alcohol wipe. Use a different alcohol wipe to clean the end of the drainage tube. 6. Connect the catheter tube to the drainage tube of the clean  bag. 7. Attach the new bag to the leg with adhesive tape or a leg strap. 8. Wash your hands.  How to prevent infection and other problems  Never pull on your catheter or try to remove it. Pulling can damage tissue in your body.  Always wash your hands before and after touching your catheter.  If a leg strap gets wet, replace it with a dry one.  Drink enough fluids to keep your pee clear or pale yellow, or as told by your doctor.  Do not let the drainage bag or tubing touch the floor.  Wear cotton underwear.  If you are male, wipe from front to back after you poop (have a bowel movement).  Check on the catheter often to make sure it works and the tubing is not twisted. Get help if:  Your pee is cloudy.  Your pee smells unusually bad.  Your  pee is not draining into the bag.  Your tube gets clogged.  Your catheter starts to leak.  Your bladder feels full. Get help right away if:  You have redness, swelling, or pain where the catheter enters your body.  You have fluid, pus, or a bad smell coming from the area where the catheter enters your body.  The area where the catheter enters your body feels warm.  You have a fever.  You have pain in your: ? Stomach (abdomen). ? Legs. ? Lower back. ? Bladder.  You see blood fill the catheter.  Your pee is pink or red.  You feel sick to your stomach (nauseous).  You throw up (vomit).  You have chills.  Your catheter gets pulled out. This information is not intended to replace advice given to you by your health care provider. Make sure you discuss any questions you have with your health care provider. Document Released: 04/24/2012 Document Revised: 11/26/2015 Document Reviewed: 06/12/2013 Elsevier Interactive Patient Education  2018 [employer].   Cystoscopy, Care After Refer to this sheet in the next few weeks. These instructions provide you with information about caring for yourself after your procedure. Your health care provider may also give you more specific instructions. Your treatment has been planned according to current medical practices, but problems sometimes occur. Call your health care provider if you have any problems or questions after your procedure. What can I expect after the procedure? After the procedure, it is common to have:  Mild pain when you urinate. Pain should stop within a few minutes after you urinate. This may last for up to 1 week.  A small amount of blood in your urine for several days.  Feeling like you need to urinate but producing only a small amount of urine.  Follow these instructions at home:  Medicines  Take over-the-counter and prescription medicines only as told by your health care provider.  If you were prescribed an  antibiotic medicine, take it as told by your health care provider. Do not stop taking the antibiotic even if you start to feel better. General instructions   Return to your normal activities as told by your health care provider. Ask your health care provider what activities are safe for you.  Do not drive for 24 hours if you received a sedative.  Watch for any blood in your urine. If the amount of blood in your urine increases, call your health care provider.  Follow instructions from your health care provider about eating or drinking restrictions.  If a tissue sample was removed for testing (biopsy)  during your procedure, it is your responsibility to get your test results. Ask your health care provider or the department performing the test when your results will be ready.  Drink enough fluid to keep your urine clear or pale yellow.  Keep all follow-up visits as told by your health care provider. This is important. Contact a health care provider if:  You have pain that gets worse or does not get better with medicine, especially pain when you urinate.  You have difficulty urinating. Get help right away if:  You have more blood in your urine.  You have blood clots in your urine.  You have abdominal pain.  You have a fever or chills.  You are unable to urinate. This information is not intended to replace advice given to you by your health care provider. Make sure you discuss any questions you have with your health care provider. Document Released: 07/17/2004 Document Revised: 06/05/2015 Document Reviewed: 11/14/2014 Elsevier Interactive Patient Education  [employer].

## 2017-11-30 ENCOUNTER — Encounter (HOSPITAL_COMMUNITY): Payer: Self-pay | Admitting: Urology

## 2017-11-30 NOTE — Anesthesia Postprocedure Evaluation (Signed)
Anesthesia Post Note  Patient: Jose S Hetz Sr.  Procedure(s) Performed: TRANSURETHRAL RESECTION OF BLADDER TUMOR (TURBT), Left Testicle Cord Block (Left )     Patient location during evaluation: PACU Anesthesia Type: General Level of consciousness: awake and alert Pain management: pain level controlled Vital Signs Assessment: post-procedure vital signs reviewed and stable Respiratory status: spontaneous breathing, nonlabored ventilation, respiratory function stable and patient connected to nasal cannula oxygen Cardiovascular status: blood pressure returned to baseline and stable Postop Assessment: no apparent nausea or vomiting Anesthetic complications: no    Last Vitals:  Vitals:   11/29/17 1615 11/29/17 1645  BP: (!) 166/76 (!) 170/77  Pulse: 80 85  Resp: 16 15  Temp: 36.7 C 36.6 C  SpO2: 99% 98%    Last Pain:  Vitals:   11/30/17 1129  TempSrc:   PainSc: 0-No pain                 Effie Berkshire

## 2017-12-02 NOTE — Progress Notes (Signed)
GU Location of Tumor / Histology: Bladder cancer  Handsome S Shockley Sr. presented with signs/symptoms of:He had multiple therapies including fulguration, BCG and most recently in May 2019 received epirubicin instillations. His most recent cystoscopy obtained on October 11, 2019which showed muscle invasive bladder tumor after TURBT. CT scan obtained on November 09, 2017 showed a possible residual tumor in the bladder but no disease beyond that.   Biopsies revealed: 10/21/17:  1. 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 Alen Blew:  1.Bladder cancerdiagnosed in 2013. He has experienced multiple recurrence and since that time and is required fulguration, BCG and most recently epirubicin instillations and 2018.  He is status post TURBT in October 2019 which showed muscle invasive disease. The natural course of this disease was reviewed today in detail with the patient and his wife. Treatment options were discussed which include radical cystectomy which she has deferred at this time due to his age. Alternatively radiation therapy concomitantly with chemotherapy can offer him a reasonable chance of cure and definite disease control. The logistics of administration of weekly chemotherapy with radiation was discussed today. Complications associated with carboplatin chemotherapy include nausea, fatigue, infusion related complications as well as renal insufficiency. We also discussed the logistics of radiation therapy that will be on a  daily basis for at least 5 to 6 weeks.  After discussion today, he is interested in learning about radiation although not sure if he wants to proceed with chemotherapy in addition. I will set him up with the radiation oncology evaluation in the near future. He will have a repeat TURBT under the care of Dr. Junious Silk in November 2019 which is already scheduled. If he decides on radiation and he is willing to proceed with chemotherapy and will facilitate that at this time. He will have to attend chemotherapy education class.  2. IV access: Peripheral veins will be used if he decides on chemotherapy.  3. Follow-up:We will be in the next few weeks to follow his progress depending on his decision.  Weight changes, if any: No  Bowel/Bladder complaints, if any: Clinically, he reports some dysuria which he has used Azo for and helped his symptoms slightly. He denies any hematuria or or frequency. Does not report frequency, urgency or hematuria.  Nausea/Vomiting, if any: Does not report any nausea, vomiting or abdominal pain. Does not report any constipation or diarrhea.  Pain issues, if any:  Pt denies c/o pain.  SAFETY ISSUES:  Prior radiation? No  Pacemaker/ICD? No  Possible current pregnancy? N/A, pt is male  Is the patient on methotrexate? No  Current Complaints / other details:  Pt presents today for follow up new visit with Dr. Sondra Come. Pt is accompanied by wife. Pt denies c/o pain. Pt reports having Foley catheter removed on Sunday.   BP (!) 141/70   Pulse 79   Temp 98.1 F (36.7 C)   Resp 18   Ht 6\' 4"  (1.93 m)   Wt 192 lb 7.4 oz (87.3 kg)   SpO2 97%   BMI 23.43 kg/m   Wt Readings from Last 3 Encounters:  12/05/17 192 lb 7.4 oz (87.3 kg)  12/05/17  192 lb 8 oz (87.3 kg)  11/29/17 188 lb 4 oz (85.4 kg)   Loma Sousa, RN BSN

## 2017-12-05 ENCOUNTER — Ambulatory Visit
Admission: RE | Admit: 2017-12-05 | Discharge: 2017-12-05 | Disposition: A | Discharge status: Home / Self Care | Payer: Medicare Other | Source: Ambulatory Visit | Attending: Radiation Oncology | Admitting: Radiation Oncology

## 2017-12-05 ENCOUNTER — Other Ambulatory Visit: Payer: Self-pay

## 2017-12-05 ENCOUNTER — Inpatient Hospital Stay: Payer: Medicare Other | Attending: Oncology | Admitting: Oncology

## 2017-12-05 ENCOUNTER — Encounter: Payer: Self-pay | Admitting: Radiation Oncology

## 2017-12-05 ENCOUNTER — Telehealth: Payer: Self-pay

## 2017-12-05 VITALS — BP 141/70 | HR 79 | Temp 98.1°F | Resp 18 | Ht 76.0 in | Wt 192.5 lb

## 2017-12-05 DIAGNOSIS — C672 Malignant neoplasm of lateral wall of bladder: Secondary | ICD-10-CM | POA: Diagnosis not present

## 2017-12-05 DIAGNOSIS — C679 Malignant neoplasm of bladder, unspecified: Secondary | ICD-10-CM | POA: Diagnosis not present

## 2017-12-05 DIAGNOSIS — Z79899 Other long term (current) drug therapy: Secondary | ICD-10-CM | POA: Diagnosis not present

## 2017-12-05 DIAGNOSIS — Z9889 Other specified postprocedural states: Secondary | ICD-10-CM | POA: Diagnosis not present

## 2017-12-05 MED ORDER — PROCHLORPERAZINE MALEATE 10 MG PO TABS: 10.0000 mg | ORAL_TABLET | Freq: Four times a day (QID) | ORAL | 0 refills | Status: DC | PRN

## 2017-12-05 NOTE — Telephone Encounter (Signed)
Printed avs and calender of upcoming appointment per 11/25 los

## 2017-12-05 NOTE — Progress Notes (Signed)
Radiation Oncology         [phone]) [phone] ________________________________  Name: Jose Burly Tirone Sr. MRN: [id number]  Date: 12/05/2017  DOB: 08-20-1925  Re-evaluation Visit Note  CC: [name], MD  Wyatt Portela, MD    ICD-10-CM   1. Cancer of lateral wall of urinary bladder (HCC) C67.2     Diagnosis:   Bladder cancer, infiltrative high-grade urothelial carcinoma presenting along the right lateral bladder wall, invading the detrusor muscle  Narrative:  The patient returns today for reevaluation following his TURB procedure.  he is doing well overall. He is accompanied by his wife. He saw Dr. Alen Blew earlier today and was scheduled to begin chemotherapy on December 9th.  Since they were last seen in the office, he had a transurethral right bladder wall resection by Dr. Junious Silk on 11/29/17 which confirmed infiltrating high grade urothelial carcinoma, which is invading the muscularis propria (detrusor muscle) with carcinoma in situ (cis) present.  At the time of cystoscopy the patient was noted to have residual tumoralong the right superior lateral wall. This was debulked an oval area extending over approximately 3-4 cm.             On review of systems, he reports urinary frequency (every 2 hours) and slight urgency. he denies hematuria, dysuria and any other symptoms. Pertinent positives are listed and detailed within the above HPI.                 ALLERGIES:  is allergic to doxycycline.  Meds: Current Outpatient Medications  Medication Sig Dispense Refill  . atorvastatin (LIPITOR) 20 MG tablet Take 20 mg by mouth daily after breakfast.     . celecoxib (CELEBREX) 200 MG capsule Take 200 mg by mouth 2 (two) times daily.    . ciprofloxacin (CIPRO) 500 MG tablet Take 1 tablet (500 mg total) by mouth at bedtime for 7 days. 7 tablet 0  . iron polysaccharides (NIFEREX) 150 MG capsule Take 150 mg by mouth daily.    [name] levothyroxine (SYNTHROID, LEVOTHROID) 175 MCG tablet Take 175  mcg by mouth daily after breakfast.     . nitroGLYCERIN (NITRODUR - DOSED IN MG/24 HR) 0.1 mg/hr patch Place 0.1 mg onto the skin daily.    . nitroGLYCERIN (NITROSTAT) 0.4 MG SL tablet Place 1 tablet (0.4 mg total) under the tongue every 5 (five) minutes as needed for chest pain. 30 tablet 12  . pantoprazole (PROTONIX) 40 MG tablet Take 1 tablet (40 mg total) by mouth daily. (Patient taking differently: Take 40 mg by mouth daily after breakfast. ) 60 tablet 0  . polyethylene glycol powder (GLYCOLAX/MIRALAX) powder Take 17 g by mouth daily as needed (for constipation).    . prochlorperazine (COMPAZINE) 10 MG tablet Take 1 tablet (10 mg total) by mouth every 6 (six) hours as needed for nausea or vomiting. 30 tablet 0  . sodium chloride (MURO 128) 5 % ophthalmic solution Place 1 drop into both eyes 3 (three) times daily as needed for eye irritation.    [name] STIOLTO RESPIMAT 2.5-2.5 MCG/ACT AERS INHALE 2 PUFFS BY MOUTH ONCE DAILY (Patient taking differently: Inhale 2 puffs into the lungs daily. ) 4 g 5  . testosterone enanthate (DELATESTRYL) 200 MG/ML injection Inject 200 mg into the muscle every 28 (twenty-eight) days. For IM use only    . triamterene-hydrochlorothiazide (MAXZIDE-25) 37.5-25 MG tablet Take 1 tablet by mouth daily.  0  . vitamin B-12 (CYANOCOBALAMIN) 1000 MCG tablet Take 1,000 mcg by mouth  3 (three) times a week.    . Vitamin D, Ergocalciferol, (DRISDOL) 50000 units CAPS capsule Take 50,000 Units by mouth every Monday.     [name] oxyCODONE-acetaminophen (PERCOCET) 5-325 MG tablet Take 1 tablet by mouth every 6 (six) hours as needed for severe pain. (Patient not taking: Reported on 12/05/2017) 12 tablet 0   No current facility-administered medications for this encounter.     Physical Findings: The patient is in no acute distress. Patient is alert and oriented.  height is 6\' 4"  (1.93 m) and weight is 192 lb 7.4 oz (87.3 kg). His temperature is 98.1 F (36.7 C). His blood pressure is 141/70  (abnormal) and his pulse is 79. His respiration is 18 and oxygen saturation is 97%. .   No significant changes.   Lab Findings: Lab Results  Component Value Date   WBC 7.5 11/29/2017   HGB 13.5 11/29/2017   HCT 45.5 11/29/2017   MCV 90.6 11/29/2017   PLT 276 11/29/2017    Radiographic Findings: No results found.  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 Due to the patient's age and other medical comorbidities he did not wish to proceed with a radical cystectomy.  Today, I talked to the patient and family about the findings and work-up thus far.  We discussed the natural history of bladder cancer and general treatment, highlighting the role of radiotherapy in the management.  We discussed the available radiation techniques, and focused on the details of logistics and delivery.  We reviewed the anticipated acute and late sequelae associated with radiation in this setting.  The patient was encouraged to ask questions that I answered to the best of my ability.  A patient consent form was discussed and signed.  We retained a copy for our records.  The patient would like to proceed with radiation and will be scheduled for CT simulation.   Plan:  Patient will return for CT simulation on December 3rd at [facility]. He is scheduled to begin chemotherapy on December 9th. Anticipate 6-6-1/2 weeks of radiation therapy.  ____________________________________   Blair Promise, PhD, MD  This document serves as a record of services personally performed by Gery Pray, MD. It was created on his behalf by Mary-Margaret Loma Messing, a trained medical scribe. The creation of this record is based on the scribe's personal observations and the provider's statements to them. This document has been  checked and approved by the attending provider.

## 2017-12-05 NOTE — Progress Notes (Signed)
Hematology and Oncology Follow Up Visit  Jose Lawson [id number] September 06, 1925 84 y.o. 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  He was initially diagnosed with superficial bladder tumor in 2013.     Prior Therapy:  He is status post multiple cystoscopy and fulguration in the past.  He also received BCG 2014 and was discontinued because of fevers. He is status post epirubicin in May 2019 for high-grade T1 disease. TURBT completed on 11/29/2017 in the care of Dr. Junious Silk.  Final pathology showed a muscle invasive tumor.  Current therapy: Under consideration to start definitive therapy in the near future with radiation concomitantly with chemotherapy.  Interim History: Jose Lawson presents today for a follow-up visit.  Since her last visit, he underwent a repeat TURBT completed by Dr. Junious Silk on 11/29/2017.  He has tolerated procedure well and is recovering without any complications.  He denies any hematuria or dysuria although he does report occasional frequency and discharge.  His performance status and quality of life remains unchanged.  Appetite remained reasonable and mobility is unchanged.  He does not report any headaches, blurry vision, syncope or seizures.  He denies any alteration mental status or confusion.  Does not report any fevers, chills or sweats.  Does not report any cough, wheezing or hemoptysis.  Does not report any chest pain, palpitation, orthopnea or leg edema.  Does not report any nausea, vomiting or abdominal pain.  Does not report any change in bowel habits. Does not report any arthralgias or myalgias.  Does not report frequency, urgency or hematuria.  Does not report any skin rashes or lesions. Does not report any bleeding complications.  Denies any change in his mood. Remaining review of systems is negative.    Medications: I have reviewed the  patient's current medications.  Current Outpatient Medications  Medication Sig Dispense Refill  . atorvastatin (LIPITOR) 20 MG tablet Take 20 mg by mouth daily after breakfast.     . ciprofloxacin (CIPRO) 500 MG tablet Take 1 tablet (500 mg total) by mouth at bedtime for 7 days. 7 tablet 0  . iron polysaccharides (NIFEREX) 150 MG capsule Take 150 mg by mouth daily.    [name] levothyroxine (SYNTHROID, LEVOTHROID) 175 MCG tablet Take 175 mcg by mouth daily after breakfast.     . nitroGLYCERIN (NITRODUR - DOSED IN MG/24 HR) 0.1 mg/hr patch Place 0.1 mg onto the skin daily.    . nitroGLYCERIN (NITROSTAT) 0.4 MG SL tablet Place 1 tablet (0.4 mg total) under the tongue every 5 (five) minutes as needed for chest pain. 30 tablet 12  . oxyCODONE-acetaminophen (PERCOCET) 5-325 MG tablet Take 1 tablet by mouth every 6 (six) hours as needed for severe pain. 12 tablet 0  . pantoprazole (PROTONIX) 40 MG tablet Take 1 tablet (40 mg total) by mouth daily. (Patient taking differently: Take 40 mg by mouth daily after breakfast. ) 60 tablet 0  . polyethylene glycol powder (GLYCOLAX/MIRALAX) powder Take 17 g by mouth daily as needed (for constipation).    . prochlorperazine (COMPAZINE) 10 MG tablet Take 1 tablet (10 mg total) by mouth every 6 (six) hours as needed for nausea or vomiting. 30 tablet 0  . sodium chloride (MURO 128) 5 % ophthalmic solution Place 1 drop into both eyes 3 (three) times daily as needed for eye irritation.    [name] STIOLTO RESPIMAT 2.5-2.5 MCG/ACT AERS INHALE 2 PUFFS BY MOUTH ONCE  DAILY (Patient taking differently: Inhale 2 puffs into the lungs daily. ) 4 g 5  . testosterone enanthate (DELATESTRYL) 200 MG/ML injection Inject 200 mg into the muscle every 28 (twenty-eight) days. For IM use only    . triamterene-hydrochlorothiazide (MAXZIDE-25) 37.5-25 MG tablet Take 1 tablet by mouth daily.  0  . vitamin B-12 (CYANOCOBALAMIN) 1000 MCG tablet Take 1,000 mcg by mouth 3 (three) times a week.    . Vitamin D,  Ergocalciferol, (DRISDOL) 50000 units CAPS capsule Take 50,000 Units by mouth every Monday.      No current facility-administered medications for this visit.      Allergies:  Allergies  Allergen Reactions  . Doxycycline     dizziness    Past Medical History, Surgical history, Social history, and Family History were reviewed and updated.  Review of Systems:  Remaining ROS negative.  Physical Exam: Blood pressure (!) 141/70, pulse 79, temperature 98.1 F (36.7 C), temperature source Oral, resp. rate 18, height 6\' 4"  (1.93 m), weight 192 lb 8 oz (87.3 kg), SpO2 97 %.   ECOG: 1   General appearance: Comfortable appearing without any discomfort Head: Normocephalic without any trauma Oropharynx: Mucous membranes are moist and pink without any thrush or ulcers. Eyes: Pupils are equal and round reactive to light. Lymph nodes: No cervical, supraclavicular, inguinal or axillary lymphadenopathy.   Heart:regular rate and rhythm.  S1 and S2 without leg edema. Lung: Clear without any rhonchi or wheezes.  No dullness to percussion. Abdomin: Soft, nontender, nondistended with good bowel sounds.  No hepatosplenomegaly. Musculoskeletal: No joint deformity or effusion.  Full range of motion noted. Neurological: No deficits noted on motor, sensory and deep tendon reflex exam. Skin: No petechial rash or dryness.  Appeared moist.      Lab Results: Lab Results  Component Value Date   WBC 7.5 11/29/2017   HGB 13.5 11/29/2017   HCT 45.5 11/29/2017   MCV 90.6 11/29/2017   PLT 276 11/29/2017     Chemistry      Component Value Date/Time   NA 143 11/29/2017 1055   K 4.1 11/29/2017 1055   CL 103 11/29/2017 1055   CO2 32 11/29/2017 1055   BUN 30 (H) 11/29/2017 1055   CREATININE 1.08 11/29/2017 1055      Component Value Date/Time   CALCIUM 9.3 11/29/2017 1055   ALKPHOS 74 01/12/2017 0414   AST 18 01/12/2017 0414   ALT 24 01/12/2017 0414   BILITOT 0.6 01/12/2017 0414         Impression and Plan:  84 year old man with:  1.  T2N0 bladder cancer documented in October 2019.  He was found to have muscle invasive disease with a repeat TUR completed on 11/29/2017 by Dr. Junious Silk.    The natural course of this disease was discussed today and treatment options were reviewed.  Given his muscle invasive nature he would benefit from definitive therapy at this time.  Given his age he would not be ideal candidate for radical cystectomy and he refused that option.  Definitive radiation therapies is a reasonable approach and I have discussed the role of concomitant chemotherapy today.  Risks and benefits of adding weekly carboplatin was reviewed.  These complications include nausea, fatigue, myelosuppression and infusion related complications.  After discussion today is agreeable to proceed after attending chemo education class.  He is meeting with Dr. Sondra Come today to discuss the role of radiation.  I anticipated weekly carboplatin for a total of 6 weeks at most.  Alternatively, supportive care and periodic cystoscopy and fulguration would be an an option.  He understands without there is a risk of recurrent bleeding and cancer metastasis.  2.  IV access: The role of Port-A-Cath insertion was reviewed today and will proceed with peripheral veins at this time.  Port-A-Cath can be considered in the future.  3.  Antiemetics: Prescription for Compazine will be available to him.  4. Follow-up: We will in 2 weeks to start weekly chemotherapy.  25  minutes was spent with the patient face-to-face today.  More than 50% of time was dedicated to discussing treatment options, complications related to therapy as well as alternative approaches to treating his cancer.     Zola Button, MD 11/25/20199:38 AM

## 2017-12-05 NOTE — Progress Notes (Signed)
START OFF PATHWAY REGIMEN - Bladder   [id number]:Carboplatin AUC=2:   Administer weekly:     Carboplatin   **Always confirm dose/schedule in your pharmacy ordering system**  Patient Characteristics: Pre Cystectomy, Clinical T2-T4a, N0-1, M0, Cystectomy Ineligible/Patient Refuses Cystectomy AJCC M Category: M0 AJCC N Category: N0 AJCC T Category: T2 Current evidence of distant metastases<= No AJCC 8 Stage Grouping: II Intent of Therapy: Non-Curative / Palliative Intent, Discussed with Patient

## 2017-12-06 DIAGNOSIS — Z85828 Personal history of other malignant neoplasm of skin: Secondary | ICD-10-CM | POA: Diagnosis not present

## 2017-12-06 DIAGNOSIS — D0471 Carcinoma in situ of skin of right lower limb, including hip: Secondary | ICD-10-CM | POA: Diagnosis not present

## 2017-12-06 DIAGNOSIS — D485 Neoplasm of uncertain behavior of skin: Secondary | ICD-10-CM | POA: Diagnosis not present

## 2017-12-06 DIAGNOSIS — L570 Actinic keratosis: Secondary | ICD-10-CM | POA: Diagnosis not present

## 2017-12-06 DIAGNOSIS — L565 Disseminated superficial actinic porokeratosis (DSAP): Secondary | ICD-10-CM | POA: Diagnosis not present

## 2017-12-06 DIAGNOSIS — D0472 Carcinoma in situ of skin of left lower limb, including hip: Secondary | ICD-10-CM | POA: Diagnosis not present

## 2017-12-07 ENCOUNTER — Telehealth: Payer: Self-pay

## 2017-12-07 NOTE — Telephone Encounter (Signed)
Received VM from [facility] at Dr. Lyndal Rainbow office that pt is cleared by surgeon to begin radiation treatments on 12/9. Information conveyed to Dr. Sondra Come. Loma Sousa, RN BSN

## 2017-12-07 NOTE — Telephone Encounter (Signed)
Contacted Dr. Lyndal Rainbow office to determine if safe for pt to start radiation treatments on 12/19/17. Spoke with Jenny Reichmann who will contact Lauren re: start date clearance. This RN's direct number given and conveyed that this VM was confidential. Awaiting return call from [facility]. Loma Sousa, RN BSN

## 2017-12-12 ENCOUNTER — Ambulatory Visit
Admission: RE | Admit: 2017-12-12 | Discharge: 2017-12-12 | Disposition: A | Discharge status: Home / Self Care | Payer: Medicare Other | Source: Ambulatory Visit | Attending: Radiation Oncology | Admitting: Radiation Oncology

## 2017-12-12 DIAGNOSIS — Z510 Encounter for antineoplastic radiation therapy: Secondary | ICD-10-CM | POA: Insufficient documentation

## 2017-12-12 DIAGNOSIS — C672 Malignant neoplasm of lateral wall of bladder: Secondary | ICD-10-CM | POA: Insufficient documentation

## 2017-12-12 NOTE — Progress Notes (Signed)
  Radiation Oncology         [id number] [phone] ________________________________  Name: Jose Burly Foti Sr. MRN: [id number]  Date: 12/12/2017  DOB: [birth year]  SIMULATION AND TREATMENT PLANNING NOTE    ICD-10-CM   1. 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  Identity was confirmed.  All relevant records and images related to the planned course of therapy were reviewed.  The patient freely provided informed written consent to proceed with treatment after reviewing the details related to the planned course of therapy. The consent form was witnessed and verified by the simulation staff.  Then, the patient was set-up in a stable reproducible  supine position for radiation therapy.  CT images were obtained.  Surface markings were placed.  The CT images were loaded into the planning software.  Then the target and avoidance structures were contoured.  Treatment planning then occurred.  The radiation prescription was entered and confirmed.  Then, I designed and supervised the construction of a total of 3 medically necessary complex treatment devices.  I have requested : Intensity Modulated Radiotherapy (IMRT) is medically necessary for this case for the following reason:  Small bowel sparing..  I have ordered:CBC  PLAN:  The patient will receive 45 Gy in 25 fractions directed the pelvis and bladder area. The patient will then proceed with a boost to the right lateral bladder region of 19.8 gray in 11 fractions for a cumulative dose of 64.8 Gy. Patient will receive radiosensitizing chemotherapy during the initial 5 weeks of this treatment.  Special Treatment Procedure Note: The patient will be receiving radiosensitizing chemotherapy. Given the potential of increased  toxicities related to combined therapy and the necessity for close monitoring of the patient and blood work, this constitutes a special treatment procedure.    -----------------------------------  Blair Promise, PhD, MD  This document serves as a record of services personally performed by Gery Pray, MD. It was created on his behalf by Mary-Margaret Loma Messing, a trained medical scribe. The creation of this record is based on the scribe's personal observations and the provider's statements to them. This document has been checked and approved by the attending provider.

## 2017-12-13 ENCOUNTER — Ambulatory Visit: Payer: Medicare Other | Admitting: Internal Medicine

## 2017-12-13 ENCOUNTER — Encounter: Payer: Self-pay | Admitting: Internal Medicine

## 2017-12-13 VITALS — BP 122/78 | HR 87 | Ht 76.0 in | Wt 191.0 lb

## 2017-12-13 DIAGNOSIS — E291 Testicular hypofunction: Secondary | ICD-10-CM | POA: Diagnosis not present

## 2017-12-13 DIAGNOSIS — J449 Chronic obstructive pulmonary disease, unspecified: Secondary | ICD-10-CM | POA: Diagnosis not present

## 2017-12-13 NOTE — Progress Notes (Addendum)
84 yo former smoker followed for COPD GOLD II and Pulmoinary HTN  Thyroid and Bladder Cancer    TEST   Data Reviewed: PFTs 07/20/12 FVC 2.66 [52%], FEV1 1.85 (53%], F/F 69, TLC 72%, RV/TLC 134%, DLCO 28%. Moderate-severe obstruction, mild restriction with airtrapping, severe diffusion impairment.  PFTs 10/06/16 FVC 2.49 (53%], FEV1 1.91 (57%], F/F 76, TLC 73%, RV/TLC 133%, DLCO 36% Moderate obstructive airway disease, mild restriction with severe diffusion defect  CT abdomen 05/30/13-mild reticular opacities/scarring at the lung bases right greater than left Chest x-ray 05/02/15-hyperinflation, mild lung base scarring Chest x-ray 06/16/14-hyperinflation, no active cardiopulmonary disease. CT scan 10/07/16- no evidence of interstitial lung disease, 4 mm subpleural pulmonary nodule, right upper lobe 1.1 cm groundglass pulmonary nodule. Coronary atherosclerosis, dilated pulmonary artery I reviewed all images personally.  Echocardiogram 10/27/16 LVEF 29-56%, grade 1 diastolic dysfunction severe elevation and PA systolic pressure.  PA peak pressure of 61 RV cavity is mildly dilated, systolic function is normal.   PFT Moderate Obstruction  HST >Severe OSA  CPAP titration study >  01/20/17 Post Hospital follow up : COPD , O2 RF  84 yo presents for a post hospital follow up .  Patient was treated for a COPD exacerbation and UTI with urosepsis  last week.  He was treated with antibiotics.  He did have some diastolic dysfunction.  Requiring diuresis.  Patient says since discharge he is feeling better.  Shortness of breath has decreased cough and congestion have also decreased.  Patient had a recent home sleep study that showed severe sleep apnea.  He has been set up for a CPAP titration study.  That is scheduled for 2 weeks.  Patient is on oxygen at bedtime. He was also discharged on oxygen 2 L with activity.  O2 saturations walking today on room air 88%.  O2 at 1 L O2 saturations 94%.   Patient needs a portable oxygen concentrator as it is too hard for him to carry his tanks.   OV 02/25/2017 Chief Complaint  Patient presents with  . Follow-up    F/U after seeing TP 01/20/17.  Pt's wife states breathing has become better.  Pt does become SOB when going upstairs or going uphill. Denies any cough or CP.   84 yo man who is quite functional has a diagnosis of COPD based on pulmonary function test.  Interstitial lung disease was suspected but ruled out on high-resolution CT chest in September 2018.  COPD is based on pulmonary function test that is mixed obstruction and restriction.  He does have significant pulmonary hypertension based on CT and echo findings.  In January 2019 he was admitted for an exacerbation that sounded like a viral exacerbation but respiratory virus panel was negative.  He also had some deep compensated cor pulmonale and was diuresed.  Right now he is here for follow-up.  He is beginning to feel better.  In fact is much better.  His only main issue is chronic arthritis and carpal tunnel syndrome.  He does have some fatigue but overall he is improved.  Walking desaturation test shows that he does not desaturate.  He plans to return his portable oxygen away.  He still using his nighttime oxygen.  In the interim he did have a sleep apnea study and it shows he has moderate obstructive sleep apnea without central sleep apnea.  Jose Lawson reviewed this and is recommended 2 L oxygen at night and also CPAP therapy with a moderate fitting mask.  However  he and his wife wanted to see Jose Lawson first before starting all this.  They want to have a discussion.  He continues with his inhaler of long-acting beta agonist and anticholinergic.  Walking desaturation test on 02/25/2017 185 feet x 3 laps on ROOM AIR:  did not desaturate. Rest pulse ox was 99%, final pulse ox was 92%. HR response 80/min at rest to 104/min at peak exertion. Patient Jose S Suh Sr.  Did not Desaturate <  88% . Jose S Kowalczyk Sr. did yes  Desaturated </= 3% points. In fact dropped 7 points Jose Sr. yes get tachyardic    05/09/2017 Follow up : COPD , Pulmonary HTN and O2 RF  Patient presents for a follow-up.  Patient has underlying moderate COPD On Stiolto.   He was admitted in December for COPD exacerbation and UTI with urosepsis.  He also had decompensated diastolic heart failure that required diuresis.  He improved after this admission.  Had been discharged on oxygen with activity.  And continued on nocturnal oxygen.  He did not use the exertional oxygen and did not show any desaturations with ambulation.  Portable oxygen was sent back to DME. Was noted to have severe pulmonary hypertension on echo 2018.  Previous home sleep study showed severe sleep apnea.  He was sent for a reevaluation with sleep consult February 2019.  He was set up for home sleep study that showed mild sleep apnea with AHI of 10..  We discussed his sleep study results.  He does not wish to proceed with CPAP.  He wishes to continue with oxygen.. Patient says over the last week or 2 has noticed that breathing has not been doing as well.  Gets more short of breath with walking.  Walk test in the office today shows patient does desaturate with walking down to 86% on room air.  Patient was placed on oxygen 2 L with O2 saturations maintaining above 90%..  Patient says he is currently on antibiotics for a UTI.  He denies any increased cough congestion and no fever.  He denies any chest pain, orthopnea, increased edema.    OV 05/16/2017  Chief Complaint  Patient presents with  . Follow-up    qualify for POC-O2 at night, winded with little exertion, SOB with increased exertion   Follow-up COPD/emphysema based on CT scan of the chest January 2019 on Stiolto Follow-up elevated pulmonary artery pressures on echocardiogram early 2019 Follow-up sleep apnea on nocturnal oxygen.  Declines CPAP  Last seen in February 2019  as part of hospital follow-up from January 2019 when he thought he had a COPD related exacerbation associated with UTI and urosepsis.  And follow-up in February had improved significantly.  Although his pulse ox decline with walking it was still adequate and he returned this exertional oxygen.  He did undergo a sleep clinic follow-up with Dr. Baird Lyons was advised CPAP but given the age and potential lack of benefits he declined.  He continues on nocturnal oxygen.  But he tells me few weeks after the visit with me in February he started noticing worsening dyspnea on exertion while walking to the mailbox and back.  He saw my nurse practitioner May 09, 2017 and it did confirm that he was not desaturating below 88% and as low as 86% and correcting with oxygen.  However the oxygen company wants him to requalify for portable oxygen system and therefore he is here today.  In addition he is frustrated by the  worsening decline in dyspnea since the onset few to several weeks ago.  There is no associated chest pain orthopnea or edema proximal nocturnal dyspnea.  He feels euvolemic.  Noted that he is not on Lasix.  There is no wheezing or cough.  He has not lost any weight.  Of note, in January 2019-minute seen him I was concerned about interstitial lung disease but given the hospitalization and confusing findings on the CT scan with pleural effusion and radiologist calling it atelectasis although this was not a high-resolution CT chest it was felt that may be his primary problem was just emphysema.  05/27/17  Patient reports the office today, for follow-up regarding recent development of chest cold as of 05/25/2017.  Patient recently had a bladder procedure to remove polyps on 05/24/2017.  Patient reports that his catheter came out today.  Patient states that on 5/15 he developed a cough with white to clear occasional sputum.  No fever, but feels hot.  Reporting some fatigue as well as dry mouth.  Patient sleeping  well.  Patient also reporting that he actually feels better today than he had over the past 2 days but still is not 100%.  Patient also reporting that they will get a high risk CT next week.  And on June 3 we will get an endoscopy as well as a colonoscopy.    OV 07/07/2017  Chief Complaint  Patient presents with  . Follow-up    Doing well he is able to be at home and not wear any oxygen and only somtimes wears it while out. He feels he is doing well.     FU emphysemna/ COPD with gold stage 2. ILD ruled out HRCT may 2019  Overall well. COPD cat score is 8 on stiolto. Uses o2 at night and with exertion. Decides exertional use based on subjective needs. Says does 1h of woodwork on room air and pulse ix 85% and bounces back with rest. Wil use o2 when goes out of home. No flare up. Saw Dr Annamaria Boots in may 2019 and advised only o2 for night o2.      Results for Jose Lawson, Jose Lawson (MRN [id number]) as of 02/25/2017 10:36  Ref. Range 10/06/2016 10:47  FEV1-Post Latest Units: L 1.91  FEV1-%Pred-Post Latest Units: % 57  FEV1-%Change-Post Latest Units: % 4  Results for Jose Lawson, Jose SR. (MRN [id number]) as of 02/25/2017 10:36  Ref. Range 10/06/2016 10:47  DLCO cor Latest Units: ml/min/mmHg 14.66  DLCO cor % pred Latest Units: % 36        OV 10/10/2017  Subjective:  Patient ID: Jose Good Sr., male , DOB: 1925-10-10 , age 46 y.o. , MRN: [id number] , ADDRESS: [place] [id number]   10/10/2017 -   Chief Complaint  Patient presents with  . Follow-up    Pt states he is about the same since last visit. Pt states he still becomes SOB when he is going upstairs and will become SOB when he exerts himself. Denies any cough or CP.     FU emphysemna/ COPD with gold stage 2. ILD ruled out HRCT may 2019  HPI Jose HABIB Sr. 84 y.o. -presents for follow-up.  He has no new interim issues other than the fact that he tripped climbing up one step and fell down.  He  scraped his forehead and nose with abrasion and also left elbow and left shin.  But this is healing well.  No loss of consciousness  or no double vision or no focal neurologic deficits.  He feels a COPD stable.  He continues on [employer].  He uses oxygen as needed.  He is in need of Prevnar vaccine and high-dose flu shot.  There are no other issues.  His main quality of life is affected by arthritis at this point he says.  He is going to be 84 years old in 1 week.  His wife feels he is doing well.       OV 12/13/2017  Subjective:  Patient ID: Jose Good Sr., male , DOB: 11-25-1925 , age 69 y.o. , MRN: [id number] , ADDRESS: [id number] [place] [id number]   12/13/2017 -   Chief Complaint  Patient presents with  . Follow-up    Breathing is the same but no problems at this time.   Gold stage COPD with crackles on exam but interstitial lung disease ruled out on high-resolution CT chest May 2019  HPI Jose Trulson Grabski Sr. 84 y.o. -presents for 40-month follow-up of his gold stage II COPD.  In the interim he has done 84 years old.  He also tells me that he has bladder cancer for which he is going to undergo chemotherapy.  At this point in time overall he stable.  COPD CAT score is improved to 6.  He uses oxygen at night and with heavy exertion based on subjective needs.  His symptom scores are listed below.  He continues with his daily Stiolto.  There are no new issues.  Immunization record reviewed.     CAT COPD Symptom & Quality of Life Score (GSK trademark) 0 is no burden. 5 is highest burden 07/07/2017  10/10/2017  12/13/2017   Never Cough -> Cough all the time 1 2 2   No phlegm in chest -> Chest is full of phlegm 0 1 0  No chest tightness -> Chest feels very tight 0 0 0  No dyspnea for 1 flight stairs/hill -> Very dyspneic for 1 flight of stairs 3 3 1   No limitations for ADL at home -> Very limited with ADL at home 1 2 0  Confident leaving home -> Not at all confident leaving  home 0 0 0  Sleep soundly -> Do not sleep soundly because of lung condition 1 0 0  Lots of Energy -> No energy at all 2 2 3   TOTAL Score (max 40)  8 10 6       Simple office walk 185 feet x  3 laps goal with finger  probe 05/16/2017  12/13/2017 Pod C - 50 feet x 10 times - 500 feet  O2 used Room air Room air  Number laps completed Only 1 and desat top 86% Finished whole thing  Comments about pace  Normal pace  Resting Pulse Ox/HR 95% and 89/min 95% and 80/min  Final Pulse Ox/HR 88% and 99/min 92% and 99/min  Desaturated </= 88% yes no  Desaturated <= 3% points yes yes  Got Tachycardic >/= 90/min yes yes  Symptoms at end of test dyspnea No symptoms  Miscellaneous comments Did  2 more laps at 2L [place] and pulse ox statyed at 90% Much improved   ROS - per HPI     has a past medical history of Arthritis, Bifascicular block, Bladder cancer (HCC), BPH (benign prostatic hyperplasia), Cataract, Chronic constipation, Chronic restrictive lung disease, Complication of anesthesia, COPD mixed type ([place]), Dyspnea, Emphysema of lung ([place]), GERD (gastroesophageal reflux disease), Grade I diastolic dysfunction (56/43/3295), History of  basal cell carcinoma excision (BILATERAL ARMS), History of colon polyps (2004), thyroid cancer, Hyperlipidemia, Hypertension, Hypothyroidism, Hypothyroidism, postsurgical, Iron deficiency anemia, Nocturia, On home oxygen therapy, Oxygen deficiency, Pulmonary hyperinflation (10/27/2016), Right bundle branch block, Sigmoid diverticulosis, Sleep apnea, and Wears glasses.   reports that he quit smoking about 35 years ago. His smoking use included cigarettes. He has a 70.00 pack-year smoking history. He has never used smokeless tobacco.  Past Surgical History:  Procedure Laterality Date  . CARDIOVASCULAR STRESS TEST  08-17-2010   dr allred   Low risk nuclear study with prominent inferobasal thinning vs small prior infarct, no ischemia/  normal LV function and wall motion , ef 63%    . CATARACT EXTRACTION W/ INTRAOCULAR LENS  IMPLANT, BILATERAL    . COLONOSCOPY    . COLONOSCOPY WITH PROPOFOL N/A 06/13/2017   Procedure: COLONOSCOPY WITH PROPOFOL;  Surgeon: Ladene Artist, MD;  Location: [facility];  Service: Endoscopy;  Laterality: N/A;  . CYSTOSCOPY W/ RETROGRADES Bilateral 10/08/2014   Procedure: CYSTOSCOPY WITH BILATERAL RETROGRADE PYELOGRAM, BIOPSY, FULGURATION;  Surgeon: Festus Aloe, MD;  Location: [facility];  Service: Urology;  Laterality: Bilateral;  . CYSTOSCOPY W/ RETROGRADES Bilateral 06/18/2016   Procedure: CYSTOSCOPY WITH RETROGRADE PYELOGRAM;  Surgeon: Festus Aloe, MD;  Location: [facility];  Service: Urology;  Laterality: Bilateral;  . CYSTOSCOPY WITH BIOPSY  12/16/2010   Procedure: CYSTOSCOPY WITH BIOPSY;  Surgeon: Molli Hazard, MD;  Location: [facility];  Service: Urology;  Laterality: N/A;  . CYSTOSCOPY WITH BIOPSY N/A 05/20/2015   Procedure: CYSTOSCOPY WITH BLADDER BIOPSY AND FULGERATION, CYSTOGRAM, BILATERAL RETROGRADES, BILATERAL RENAL WASHINGS;  Surgeon: Festus Aloe, MD;  Location: [facility];  Service: Urology;  Laterality: N/A;  . CYSTOSCOPY WITH BIOPSY N/A 06/18/2016   Procedure: CYSTOSCOPY WITH BIOPSY FULGURATION;  Surgeon: Festus Aloe, MD;  Location: [facility];  Service: Urology;  Laterality: N/A;  . CYSTOSCOPY WITH BIOPSY N/A 05/24/2017   Procedure: CYSTOSCOPY WITH BIOPSY/ FULGURATION 0.5 TO 2CM AND LEFT CORD BLOCK;  Surgeon: Festus Aloe, MD;  Location: [facility];  Service: Urology;  Laterality: N/A;  . CYSTOSCOPY/RETROGRADE/URETEROSCOPY  12/16/2010   Procedure: CYSTOSCOPY/RETROGRADE/URETEROSCOPY;  Surgeon: Molli Hazard, MD;  Location: [facility];  Service: Urology;  Laterality: Right;  bilateral retrograde, right ureteroscopy  . ESOPHAGOGASTRODUODENOSCOPY (EGD) WITH PROPOFOL N/A 06/13/2017   Procedure: ESOPHAGOGASTRODUODENOSCOPY (EGD) WITH PROPOFOL;  Surgeon:  Ladene Artist, MD;  Location: [facility];  Service: Endoscopy;  Laterality: N/A;  . EYE SURGERY     attached lens to iris  . GREEN LIGHT LASER TURP (TRANSURETHRAL RESECTION OF PROSTATE  11-02-2011  . HOT HEMOSTASIS N/A 06/13/2017   Procedure: HOT HEMOSTASIS (ARGON PLASMA COAGULATION/BICAP);  Surgeon: Ladene Artist, MD;  Location: [facility];  Service: Endoscopy;  Laterality: N/A;  . KNEE ARTHROSCOPY Left 11/  2016  . POLYPECTOMY    . POLYPECTOMY  06/13/2017   Procedure: POLYPECTOMY;  Surgeon: Ladene Artist, MD;  Location: [facility];  Service: Endoscopy;;  . TOTAL THYROIDECTOMY  1991  . TRANSURETHRAL RESECTION OF BLADDER TUMOR  12/16/2010   Procedure: TRANSURETHRAL RESECTION OF BLADDER TUMOR (TURBT);  Surgeon: Molli Hazard, MD;  Location: [facility];  Service: Urology;  Laterality: N/A;  . TRANSURETHRAL RESECTION OF BLADDER TUMOR N/A 10/21/2017   Procedure: TRANSURETHRAL RESECTION OF BLADDER TUMOR (TURBT) GREATER THAN 5CM;  Surgeon: Festus Aloe, MD;  Location: [facility];  Service: Urology;  Laterality: N/A;  . TRANSURETHRAL RESECTION OF BLADDER TUMOR  Left 11/29/2017   Procedure: TRANSURETHRAL RESECTION OF BLADDER TUMOR (TURBT), Left Testicle Cord Block;  Surgeon: Festus Aloe, MD;  Location: [facility];  Service: Urology;  Laterality: Left;    Allergies  Allergen Reactions  . Doxycycline     dizziness    Immunization History  Administered Date(s) Administered  . Influenza Whole 10/12/2011  . Influenza, High Dose Seasonal PF 10/04/2016, 10/10/2017  . Pneumococcal Conjugate-13 10/10/2017  . Pneumococcal Polysaccharide-23 01/20/2014    Family History  Problem Relation Age of Onset  . Lung cancer Brother   . Thyroid cancer Unknown   . Diabetes Father   . Colon cancer Neg Hx   . Colon polyps Neg Hx   . Rectal cancer Neg Hx   . Stomach cancer Neg Hx      Current Outpatient Medications:  .  atorvastatin (LIPITOR) 20 MG tablet, Take 20 mg  by mouth daily after breakfast. , Disp: , Rfl:  .  iron polysaccharides (NIFEREX) 150 MG capsule, Take 150 mg by mouth daily., Disp: , Rfl:  .  levothyroxine (SYNTHROID, LEVOTHROID) 175 MCG tablet, Take 175 mcg by mouth daily after breakfast. , Disp: , Rfl:  .  nitroGLYCERIN (NITRODUR - DOSED IN MG/24 HR) 0.1 mg/hr patch, Place 0.1 mg onto the skin daily., Disp: , Rfl:  .  nitroGLYCERIN (NITROSTAT) 0.4 MG SL tablet, Place 1 tablet (0.4 mg total) under the tongue every 5 (five) minutes as needed for chest pain., Disp: 30 tablet, Rfl: 12 .  pantoprazole (PROTONIX) 40 MG tablet, Take 1 tablet (40 mg total) by mouth daily. (Patient taking differently: Take 40 mg by mouth daily after breakfast. ), Disp: 60 tablet, Rfl: 0 .  polyethylene glycol powder (GLYCOLAX/MIRALAX) powder, Take 17 g by mouth daily as needed (for constipation)., Disp: , Rfl:  .  prochlorperazine (COMPAZINE) 10 MG tablet, Take 1 tablet (10 mg total) by mouth every 6 (six) hours as needed for nausea or vomiting., Disp: 30 tablet, Rfl: 0 .  sodium chloride (MURO 128) 5 % ophthalmic solution, Place 1 drop into both eyes 3 (three) times daily as needed for eye irritation., Disp: , Rfl:  .  STIOLTO RESPIMAT 2.5-2.5 MCG/ACT AERS, INHALE 2 PUFFS BY MOUTH ONCE DAILY (Patient taking differently: Inhale 2 puffs into the lungs daily. ), Disp: 4 g, Rfl: 5 .  testosterone enanthate (DELATESTRYL) 200 MG/ML injection, Inject 200 mg into the muscle every 28 (twenty-eight) days. For IM use only, Disp: , Rfl:  .  triamterene-hydrochlorothiazide (MAXZIDE-25) 37.5-25 MG tablet, Take 1 tablet by mouth daily., Disp: , Rfl: 0 .  vitamin B-12 (CYANOCOBALAMIN) 1000 MCG tablet, Take 1,000 mcg by mouth 3 (three) times a week., Disp: , Rfl:  .  Vitamin D, Ergocalciferol, (DRISDOL) 50000 units CAPS capsule, Take 50,000 Units by mouth every Monday. , Disp: , Rfl:       Objective:   Vitals:   12/13/17 0912  BP: 122/78  Pulse: 87  SpO2: 96%  Weight: 191 lb  (86.6 kg)  Height: 6\' 4"  (1.93 m)    Estimated body mass index is 23.25 kg/m as calculated from the following:   Height as of this encounter: 6\' 4"  (1.93 m).   Weight as of this encounter: 191 lb (86.6 kg).  @WEIGHTCHANGE @  [employer]   12/13/17 0912  Weight: 191 lb (86.6 kg)     Physical Exam  General Appearance:    Alert, cooperative, no distress, appears stated age - yes , Deconditioned looking - no ,  OBESE  - no, Sitting on Wheelchair -  no  Head:    Normocephalic, without obvious abnormality, atraumatic  Eyes:    PERRL, conjunctiva/corneas clear,  Ears:    Normal TM's and external ear canals, both ears  Nose:   Nares normal, septum midline, mucosa normal, no drainage    or sinus tenderness. OXYGEN ON  - no . Patient is @ ra   Throat:   Lips, mucosa, and tongue normal; teeth and gums normal. Cyanosis on lips - no  Neck:   Supple, symmetrical, trachea midline, no adenopathy;    thyroid:  no enlargement/tenderness/nodules; no carotid   bruit or JVD  Back:     Symmetric, no curvature, ROM normal, no CVA tenderness  Lungs:     Distress - no , Wheeze no, Barrell Chest - no, Purse lip breathing - no, Crackles - yes at base; baseline   Chest Wall:    No tenderness or deformity.    Heart:    Regular rate and rhythm, S1 and S2 normal, no rub   or gallop, Murmur - no  Breast Exam:    NOT DONE  Abdomen:     Soft, non-tender, bowel sounds active all four quadrants,    no masses, no organomegaly. Visceral obesity - no  Genitalia:   NOT DONE  Rectal:   NOT DONE  Extremities:   Extremities - normal, Has Cane - no, Clubbing - no, Edema - no  Pulses:   2+ and symmetric all extremities  Skin:   Stigmata of Connective Tissue Disease - no  Lymph nodes:   Cervical, supraclavicular, and axillary nodes normal  Psychiatric:  Neurologic:   Pleasant - yes, Anxious - no, Flat affect - no  CAm-ICU - neg, Alert and Oriented x 3 - yes, Moves all 4s - yes, Speech - normal, Cognition - intact            Assessment:       ICD-10-CM   1. COPD GOLD II J44.9        Plan:     Patient Instructions     ICD-10-CM   1. COPD GOLD II J44.9     Stable COPD [place] happy 89th birthday October 2017  Plan -Continue Stiolto daily -Use albuterol as needed -Continue nighttime oxygen with exertional oxygen in the daytime as needed -Best wishes for bladder cancer therapy - Please talk to PCP [name], MD -  and ensure you get  shingrix ([place]) inactivated vaccine against shingles   Follow-up 6 months or sooner if needed     SIGNATURE    Dr. Brand Males, M.D., F.C.C.P,  Pulmonary and Critical Care Medicine Staff Physician, [place] Director - Interstitial Lung Disease  Program  [facility] [place] [facility] [place], [place], [address]  Pager: [phone], If no answer or between  15:00h - 7:00h: call [phone] Telephone: [phone]  9:28 AM 12/13/2017

## 2017-12-13 NOTE — Patient Instructions (Addendum)
ICD-10-CM   1. COPD GOLD II J44.9     Stable COPD [place] happy 89th birthday October 2017  Plan -Continue Stiolto daily -Use albuterol as needed -Continue nighttime oxygen with exertional oxygen in the daytime as needed -Best wishes for bladder cancer therapy - Please talk to PCP [name], MD -  and ensure you get  shingrix ([place]) inactivated vaccine against shingles   Follow-up 6 months or sooner if needed

## 2017-12-14 ENCOUNTER — Inpatient Hospital Stay: Payer: Medicare Other | Attending: Oncology

## 2017-12-14 DIAGNOSIS — Z5111 Encounter for antineoplastic chemotherapy: Secondary | ICD-10-CM | POA: Insufficient documentation

## 2017-12-14 DIAGNOSIS — C672 Malignant neoplasm of lateral wall of bladder: Secondary | ICD-10-CM | POA: Insufficient documentation

## 2017-12-14 DIAGNOSIS — Z79899 Other long term (current) drug therapy: Secondary | ICD-10-CM | POA: Insufficient documentation

## 2017-12-14 DIAGNOSIS — R300 Dysuria: Secondary | ICD-10-CM | POA: Insufficient documentation

## 2017-12-15 DIAGNOSIS — R0602 Shortness of breath: Secondary | ICD-10-CM | POA: Diagnosis not present

## 2017-12-15 DIAGNOSIS — N179 Acute kidney failure, unspecified: Secondary | ICD-10-CM | POA: Diagnosis not present

## 2017-12-15 DIAGNOSIS — J449 Chronic obstructive pulmonary disease, unspecified: Secondary | ICD-10-CM | POA: Diagnosis not present

## 2017-12-16 ENCOUNTER — Encounter: Payer: Self-pay | Admitting: Oncology

## 2017-12-16 NOTE — Progress Notes (Signed)
Called pt to introduce myself as his Arboriculturist.  Unfortunately there aren't any foundations offering copay assistance for his Dx and the type of ins he has.  I offered the [employer] and went over what it covers.  He declined stating he's not rich but he's doing ok financially.  I will give him my card on 12/19/17 in case his situation changes and for any questions or concerns he may have in the future.

## 2017-12-19 ENCOUNTER — Inpatient Hospital Stay: Payer: Medicare Other

## 2017-12-19 VITALS — BP 137/59 | HR 85 | Temp 97.9°F | Resp 17 | Ht 76.0 in | Wt 191.8 lb

## 2017-12-19 DIAGNOSIS — R300 Dysuria: Secondary | ICD-10-CM | POA: Diagnosis not present

## 2017-12-19 DIAGNOSIS — Z79899 Other long term (current) drug therapy: Secondary | ICD-10-CM | POA: Diagnosis not present

## 2017-12-19 DIAGNOSIS — C679 Malignant neoplasm of bladder, unspecified: Secondary | ICD-10-CM

## 2017-12-19 DIAGNOSIS — Z5111 Encounter for antineoplastic chemotherapy: Secondary | ICD-10-CM | POA: Diagnosis not present

## 2017-12-19 DIAGNOSIS — C672 Malignant neoplasm of lateral wall of bladder: Secondary | ICD-10-CM | POA: Diagnosis not present

## 2017-12-19 LAB — CBC WITH DIFFERENTIAL (CANCER CENTER ONLY)
ABS IMMATURE GRANULOCYTES: 0.02 10*3/uL (ref 0.00–0.07)
BASOS ABS: 0.1 10*3/uL (ref 0.0–0.1)
BASOS PCT: 1 %
Eosinophils Absolute: 0.4 10*3/uL (ref 0.0–0.5)
Eosinophils Relative: 5 %
HCT: 45.1 % (ref 39.0–52.0)
Hemoglobin: 13.7 g/dL (ref 13.0–17.0)
IMMATURE GRANULOCYTES: 0 %
Lymphocytes Relative: 12 %
Lymphs Abs: 0.9 10*3/uL (ref 0.7–4.0)
MCH: 26.9 pg (ref 26.0–34.0)
MCHC: 30.4 g/dL (ref 30.0–36.0)
MCV: 88.4 fL (ref 80.0–100.0)
Monocytes Absolute: 0.7 10*3/uL (ref 0.1–1.0)
Monocytes Relative: 10 %
NEUTROS ABS: 5.1 10*3/uL (ref 1.7–7.7)
NEUTROS PCT: 72 %
PLATELETS: 333 10*3/uL (ref 150–400)
RBC: 5.1 MIL/uL (ref 4.22–5.81)
RDW: 18.5 % — ABNORMAL HIGH (ref 11.5–15.5)
WBC Count: 7.2 10*3/uL (ref 4.0–10.5)
nRBC: 0 % (ref 0.0–0.2)

## 2017-12-19 LAB — CMP (CANCER CENTER ONLY)
ALT: 12 U/L (ref 0–44)
AST: 13 U/L — AB (ref 15–41)
Albumin: 3.8 g/dL (ref 3.5–5.0)
Alkaline Phosphatase: 81 U/L (ref 38–126)
Anion gap: 9 (ref 5–15)
BUN: 20 mg/dL (ref 8–23)
CHLORIDE: 101 mmol/L (ref 98–111)
CO2: 31 mmol/L (ref 22–32)
Calcium: 9.9 mg/dL (ref 8.9–10.3)
Creatinine: 1.17 mg/dL (ref 0.61–1.24)
GFR, EST NON AFRICAN AMERICAN: 55 mL/min — AB (ref >60)
Glucose, Bld: 82 mg/dL (ref 70–99)
Potassium: 4.1 mmol/L (ref 3.5–5.1)
Sodium: 141 mmol/L (ref 135–145)
TOTAL PROTEIN: 6.9 g/dL (ref 6.5–8.1)
Total Bilirubin: 0.6 mg/dL (ref 0.3–1.2)

## 2017-12-19 MED ORDER — SODIUM CHLORIDE 0.9 % IV SOLN
153.6000 mg | Freq: Once | INTRAVENOUS | Status: AC
  Administered 2017-12-19: 150 mg via INTRAVENOUS
  Filled 2017-12-19: qty 15

## 2017-12-19 MED ORDER — PALONOSETRON HCL INJECTION 0.25 MG/5ML
0.2500 mg | Freq: Once | INTRAVENOUS | Status: AC
  Administered 2017-12-19: 0.25 mg via INTRAVENOUS

## 2017-12-19 MED ORDER — DEXAMETHASONE SODIUM PHOSPHATE 10 MG/ML IJ SOLN
10.0000 mg | Freq: Once | INTRAMUSCULAR | Status: AC
  Administered 2017-12-19: 10 mg via INTRAVENOUS

## 2017-12-19 MED ORDER — DEXAMETHASONE SODIUM PHOSPHATE 10 MG/ML IJ SOLN
INTRAMUSCULAR | Status: AC
  Filled 2017-12-19: qty 1

## 2017-12-19 MED ORDER — SODIUM CHLORIDE 0.9 % IV SOLN
Freq: Once | INTRAVENOUS | Status: AC
  Administered 2017-12-19: 13:00:00 via INTRAVENOUS
  Filled 2017-12-19: qty 250

## 2017-12-19 MED ORDER — PALONOSETRON HCL INJECTION 0.25 MG/5ML
INTRAVENOUS | Status: AC
  Filled 2017-12-19: qty 5

## 2017-12-19 NOTE — Patient Instructions (Signed)
[facility] Discharge Instructions for Patients Receiving Chemotherapy  Today you received the following chemotherapy agents: Carboplatin (Paraplatin)  To help prevent nausea and vomiting after your treatment, we encourage you to take your nausea medication as directed.    If you develop nausea and vomiting that is not controlled by your nausea medication, call the clinic.   BELOW ARE SYMPTOMS THAT SHOULD BE REPORTED IMMEDIATELY:  *FEVER GREATER THAN 100.5 F  *CHILLS WITH OR WITHOUT FEVER  NAUSEA AND VOMITING THAT IS NOT CONTROLLED WITH YOUR NAUSEA MEDICATION  *UNUSUAL SHORTNESS OF BREATH  *UNUSUAL BRUISING OR BLEEDING  TENDERNESS IN MOUTH AND THROAT WITH OR WITHOUT PRESENCE OF ULCERS  *URINARY PROBLEMS  *BOWEL PROBLEMS  UNUSUAL RASH Items with * indicate a potential emergency and should be followed up as soon as possible.  Feel free to call the clinic should you have any questions or concerns. The clinic phone number is [phone].  Please show the [place] at check-in to the Emergency Department and triage nurse.  Carboplatin injection What is this medicine? CARBOPLATIN (KAR boe pla tin) is a chemotherapy drug. It targets fast dividing cells, like cancer cells, and causes these cells to die. This medicine is used to treat ovarian cancer and many other cancers. This medicine may be used for other purposes; ask your health care provider or pharmacist if you have questions. COMMON BRAND NAME(S): Paraplatin What should I tell my health care provider before I take this medicine? They need to know if you have any of these conditions: -blood disorders -hearing problems -kidney disease -recent or ongoing radiation therapy -an unusual or allergic reaction to carboplatin, cisplatin, other chemotherapy, other medicines, foods, dyes, or preservatives -pregnant or trying to get pregnant -breast-feeding How should I use this medicine? This drug is  usually given as an infusion into a vein. It is administered in a hospital or clinic by a specially trained health care professional. Talk to your pediatrician regarding the use of this medicine in children. Special care may be needed. Overdosage: If you think you have taken too much of this medicine contact a poison control center or emergency room at once. NOTE: This medicine is only for you. Do not share this medicine with others. What if I miss a dose? It is important not to miss a dose. Call your doctor or health care professional if you are unable to keep an appointment. What may interact with this medicine? -medicines for seizures -medicines to increase blood counts like filgrastim, pegfilgrastim, sargramostim -some antibiotics like amikacin, gentamicin, neomycin, streptomycin, tobramycin -vaccines Talk to your doctor or health care professional before taking any of these medicines: -acetaminophen -aspirin -ibuprofen -ketoprofen -naproxen This list may not describe all possible interactions. Give your health care provider a list of all the medicines, herbs, non-prescription drugs, or dietary supplements you use. Also tell them if you smoke, drink alcohol, or use illegal drugs. Some items may interact with your medicine. What should I watch for while using this medicine? Your condition will be monitored carefully while you are receiving this medicine. You will need important blood work done while you are taking this medicine. This drug may make you feel generally unwell. This is not uncommon, as chemotherapy can affect healthy cells as well as cancer cells. Report any side effects. Continue your course of treatment even though you feel ill unless your doctor tells you to stop. In some cases, you may be given additional medicines to help with side effects.  Follow all directions for their use. Call your doctor or health care professional for advice if you get a fever, chills or sore throat,  or other symptoms of a cold or flu. Do not treat yourself. This drug decreases your body's ability to fight infections. Try to avoid being around people who are sick. This medicine may increase your risk to bruise or bleed. Call your doctor or health care professional if you notice any unusual bleeding. Be careful brushing and flossing your teeth or using a toothpick because you may get an infection or bleed more easily. If you have any dental work done, tell your dentist you are receiving this medicine. Avoid taking products that contain aspirin, acetaminophen, ibuprofen, naproxen, or ketoprofen unless instructed by your doctor. These medicines may hide a fever. Do not become pregnant while taking this medicine. Women should inform their doctor if they wish to become pregnant or think they might be pregnant. There is a potential for serious side effects to an unborn child. Talk to your health care professional or pharmacist for more information. Do not breast-feed an infant while taking this medicine. What side effects may I notice from receiving this medicine? Side effects that you should report to your doctor or health care professional as soon as possible: -allergic reactions like skin rash, itching or hives, swelling of the face, lips, or tongue -signs of infection - fever or chills, cough, sore throat, pain or difficulty passing urine -signs of decreased platelets or bleeding - bruising, pinpoint red spots on the skin, black, tarry stools, nosebleeds -signs of decreased red blood cells - unusually weak or tired, fainting spells, lightheadedness -breathing problems -changes in hearing -changes in vision -chest pain -high blood pressure -low blood counts - This drug may decrease the number of white blood cells, red blood cells and platelets. You may be at increased risk for infections and bleeding. -nausea and vomiting -pain, swelling, redness or irritation at the injection site -pain,  tingling, numbness in the hands or feet -problems with balance, talking, walking -trouble passing urine or change in the amount of urine Side effects that usually do not require medical attention (report to your doctor or health care professional if they continue or are bothersome): -hair loss -loss of appetite -metallic taste in the mouth or changes in taste This list may not describe all possible side effects. Call your doctor for medical advice about side effects. You may report side effects to FDA at 1-800-FDA-1088. Where should I keep my medicine? This drug is given in a hospital or clinic and will not be stored at home. NOTE: This sheet is a summary. It may not cover all possible information. If you have questions about this medicine, talk to your doctor, pharmacist, or health care provider.  2018 Elsevier/Gold Standard (2007-04-04 14:38:05)

## 2017-12-20 ENCOUNTER — Telehealth: Payer: Self-pay | Admitting: Emergency Medicine

## 2017-12-20 NOTE — Telephone Encounter (Signed)
First time chemo follow up call from infusion on 12/9.  Denies issues at this time.  Took some compazine for mild nausea which was resolved.  Verbalized understanding he can call with questions or concerns and that he has a radiation appt tomorrow.

## 2017-12-21 ENCOUNTER — Ambulatory Visit
Admission: RE | Admit: 2017-12-21 | Discharge: 2017-12-21 | Disposition: A | Discharge status: Home / Self Care | Payer: Medicare Other | Source: Ambulatory Visit | Attending: Radiation Oncology | Admitting: Radiation Oncology

## 2017-12-21 DIAGNOSIS — C672 Malignant neoplasm of lateral wall of bladder: Secondary | ICD-10-CM

## 2017-12-21 NOTE — Progress Notes (Signed)
  Radiation Oncology         [phone]) [phone] ________________________________  Name: Jose Burly President Sr. MRN: [id number]  Date: 12/21/2017  DOB: 1925/11/18  Simulation Verification Note    ICD-10-CM   1. 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 The clinical setup was verified. Then port films were obtained and uploaded to the radiation oncology medical record software.  The treatment beams were carefully compared against the planned radiation fields. The position location and shape of the radiation fields was reviewed. They targeted volume of tissue appears to be appropriately covered by the radiation beams. Organs at risk appear to be excluded as planned.  Based on my personal review, I approved the simulation verification. The patient's treatment will proceed as planned.  -----------------------------------  Blair Promise, PhD, MD This document serves as a record of services personally performed by Gery Pray, MD. It was created on his behalf by Mary-Margaret Loma Messing, a trained medical scribe. The creation of this record is based on the scribe's personal observations and the provider's statements to them. This document has been checked and approved by the attending provider.

## 2017-12-22 ENCOUNTER — Ambulatory Visit
Admission: RE | Admit: 2017-12-22 | Discharge: 2017-12-22 | Disposition: A | Discharge status: Home / Self Care | Payer: Medicare Other | Source: Ambulatory Visit | Attending: Radiation Oncology | Admitting: Radiation Oncology

## 2017-12-22 DIAGNOSIS — C672 Malignant neoplasm of lateral wall of bladder: Secondary | ICD-10-CM | POA: Diagnosis not present

## 2017-12-23 ENCOUNTER — Ambulatory Visit
Admission: RE | Admit: 2017-12-23 | Discharge: 2017-12-23 | Disposition: A | Discharge status: Home / Self Care | Payer: Medicare Other | Source: Ambulatory Visit | Attending: Radiation Oncology | Admitting: Radiation Oncology

## 2017-12-23 DIAGNOSIS — C672 Malignant neoplasm of lateral wall of bladder: Secondary | ICD-10-CM | POA: Diagnosis not present

## 2017-12-26 ENCOUNTER — Ambulatory Visit
Admission: RE | Admit: 2017-12-26 | Discharge: 2017-12-26 | Disposition: A | Discharge status: Home / Self Care | Payer: Medicare Other | Source: Ambulatory Visit | Attending: Radiation Oncology | Admitting: Radiation Oncology

## 2017-12-26 ENCOUNTER — Inpatient Hospital Stay: Payer: Medicare Other

## 2017-12-26 ENCOUNTER — Other Ambulatory Visit: Payer: Self-pay | Admitting: Medical

## 2017-12-26 ENCOUNTER — Other Ambulatory Visit: Payer: Self-pay | Admitting: Hematology

## 2017-12-26 ENCOUNTER — Inpatient Hospital Stay (HOSPITAL_BASED_OUTPATIENT_CLINIC_OR_DEPARTMENT_OTHER): Payer: Medicare Other | Admitting: Medical

## 2017-12-26 VITALS — BP 151/65 | HR 89 | Temp 98.0°F | Resp 18

## 2017-12-26 DIAGNOSIS — Z79899 Other long term (current) drug therapy: Secondary | ICD-10-CM | POA: Diagnosis not present

## 2017-12-26 DIAGNOSIS — C672 Malignant neoplasm of lateral wall of bladder: Secondary | ICD-10-CM | POA: Diagnosis not present

## 2017-12-26 DIAGNOSIS — C679 Malignant neoplasm of bladder, unspecified: Secondary | ICD-10-CM

## 2017-12-26 DIAGNOSIS — R300 Dysuria: Secondary | ICD-10-CM | POA: Diagnosis not present

## 2017-12-26 DIAGNOSIS — N390 Urinary tract infection, site not specified: Secondary | ICD-10-CM

## 2017-12-26 DIAGNOSIS — Z5111 Encounter for antineoplastic chemotherapy: Secondary | ICD-10-CM | POA: Diagnosis not present

## 2017-12-26 LAB — CBC WITH DIFFERENTIAL (CANCER CENTER ONLY)
ABS IMMATURE GRANULOCYTES: 0.03 10*3/uL (ref 0.00–0.07)
Basophils Absolute: 0 10*3/uL (ref 0.0–0.1)
Basophils Relative: 0 %
Eosinophils Absolute: 0.2 10*3/uL (ref 0.0–0.5)
Eosinophils Relative: 3 %
HEMATOCRIT: 42.7 % (ref 39.0–52.0)
HEMOGLOBIN: 13.1 g/dL (ref 13.0–17.0)
Immature Granulocytes: 0 %
LYMPHS PCT: 9 %
Lymphs Abs: 0.7 10*3/uL (ref 0.7–4.0)
MCH: 27.3 pg (ref 26.0–34.0)
MCHC: 30.7 g/dL (ref 30.0–36.0)
MCV: 89.1 fL (ref 80.0–100.0)
Monocytes Absolute: 0.8 10*3/uL (ref 0.1–1.0)
Monocytes Relative: 11 %
NEUTROS ABS: 5.5 10*3/uL (ref 1.7–7.7)
Neutrophils Relative %: 77 %
Platelet Count: 320 10*3/uL (ref 150–400)
RBC: 4.79 MIL/uL (ref 4.22–5.81)
RDW: 19.2 % — ABNORMAL HIGH (ref 11.5–15.5)
WBC Count: 7.2 10*3/uL (ref 4.0–10.5)
nRBC: 0 % (ref 0.0–0.2)

## 2017-12-26 LAB — CMP (CANCER CENTER ONLY)
ALT: 12 U/L (ref 0–44)
AST: 11 U/L — ABNORMAL LOW (ref 15–41)
Albumin: 3.5 g/dL (ref 3.5–5.0)
Alkaline Phosphatase: 83 U/L (ref 38–126)
Anion gap: 11 (ref 5–15)
BUN: 22 mg/dL (ref 8–23)
CO2: 30 mmol/L (ref 22–32)
Calcium: 9.7 mg/dL (ref 8.9–10.3)
Chloride: 99 mmol/L (ref 98–111)
Creatinine: 1.24 mg/dL (ref 0.61–1.24)
GFR, Est AFR Am: 59 mL/min — ABNORMAL LOW (ref >60)
GFR, Estimated: 51 mL/min — ABNORMAL LOW (ref >60)
Glucose, Bld: 120 mg/dL — ABNORMAL HIGH (ref 70–99)
Potassium: 4.2 mmol/L (ref 3.5–5.1)
Sodium: 140 mmol/L (ref 135–145)
Total Bilirubin: 0.6 mg/dL (ref 0.3–1.2)
Total Protein: 6.5 g/dL (ref 6.5–8.1)

## 2017-12-26 MED ORDER — CIPROFLOXACIN HCL 500 MG PO TABS: 500.0000 mg | ORAL_TABLET | Freq: Two times a day (BID) | ORAL | 0 refills | Status: DC

## 2017-12-26 MED ORDER — SODIUM CHLORIDE 0.9 % IV SOLN
147.8000 mg | Freq: Once | INTRAVENOUS | Status: AC
  Administered 2017-12-26: 150 mg via INTRAVENOUS
  Filled 2017-12-26: qty 15

## 2017-12-26 MED ORDER — DEXAMETHASONE SODIUM PHOSPHATE 10 MG/ML IJ SOLN
10.0000 mg | Freq: Once | INTRAMUSCULAR | Status: AC
  Administered 2017-12-26: 10 mg via INTRAVENOUS

## 2017-12-26 MED ORDER — DEXAMETHASONE SODIUM PHOSPHATE 10 MG/ML IJ SOLN
INTRAMUSCULAR | Status: AC
  Filled 2017-12-26: qty 1

## 2017-12-26 MED ORDER — PALONOSETRON HCL INJECTION 0.25 MG/5ML
0.2500 mg | Freq: Once | INTRAVENOUS | Status: AC
  Administered 2017-12-26: 0.25 mg via INTRAVENOUS

## 2017-12-26 MED ORDER — SODIUM CHLORIDE 0.9 % IV SOLN
Freq: Once | INTRAVENOUS | Status: AC
  Administered 2017-12-26: 10:00:00 via INTRAVENOUS
  Filled 2017-12-26: qty 250

## 2017-12-26 MED ORDER — PALONOSETRON HCL INJECTION 0.25 MG/5ML
INTRAVENOUS | Status: AC
  Filled 2017-12-26: qty 5

## 2017-12-26 NOTE — Patient Instructions (Signed)
[facility] Discharge Instructions for Patients Receiving Chemotherapy  Today you received the following chemotherapy agents: Carboplatin (Paraplatin)  To help prevent nausea and vomiting after your treatment, we encourage you to take your nausea medication as directed.    If you develop nausea and vomiting that is not controlled by your nausea medication, call the clinic.   BELOW ARE SYMPTOMS THAT SHOULD BE REPORTED IMMEDIATELY:  *FEVER GREATER THAN 100.5 F  *CHILLS WITH OR WITHOUT FEVER  NAUSEA AND VOMITING THAT IS NOT CONTROLLED WITH YOUR NAUSEA MEDICATION  *UNUSUAL SHORTNESS OF BREATH  *UNUSUAL BRUISING OR BLEEDING  TENDERNESS IN MOUTH AND THROAT WITH OR WITHOUT PRESENCE OF ULCERS  *URINARY PROBLEMS  *BOWEL PROBLEMS  UNUSUAL RASH Items with * indicate a potential emergency and should be followed up as soon as possible.  Feel free to call the clinic should you have any questions or concerns. The clinic phone number is [phone].  Please show the CHEMO ALERT CARD at check-in to the Emergency Department and triage nurse.   

## 2017-12-27 ENCOUNTER — Ambulatory Visit
Admission: RE | Admit: 2017-12-27 | Discharge: 2017-12-27 | Disposition: A | Discharge status: Home / Self Care | Payer: Medicare Other | Source: Ambulatory Visit | Attending: Radiation Oncology | Admitting: Radiation Oncology

## 2017-12-27 DIAGNOSIS — C672 Malignant neoplasm of lateral wall of bladder: Secondary | ICD-10-CM | POA: Diagnosis not present

## 2017-12-28 ENCOUNTER — Ambulatory Visit
Admission: RE | Admit: 2017-12-28 | Discharge: 2017-12-28 | Disposition: A | Discharge status: Home / Self Care | Payer: Medicare Other | Source: Ambulatory Visit | Attending: Radiation Oncology | Admitting: Radiation Oncology

## 2017-12-28 DIAGNOSIS — C672 Malignant neoplasm of lateral wall of bladder: Secondary | ICD-10-CM | POA: Diagnosis not present

## 2017-12-28 NOTE — Progress Notes (Signed)
Mr. Jose Lawson was seen in infusion. He is on day #3 of Cipro 250 mg PO BID which was prescribed by his urologist for a UTI. He has previously been dosed at 500 mg PO BID and does not believe that he has improved. After discussion with him it was decided to change his prescription to Cipro 500 mg PO BID x 4 additional days.   Sandi Mealy, MHS, PA-C Physician Assistant

## 2017-12-29 ENCOUNTER — Other Ambulatory Visit: Payer: Self-pay | Admitting: Radiation Oncology

## 2017-12-29 ENCOUNTER — Ambulatory Visit
Admission: RE | Admit: 2017-12-29 | Discharge: 2017-12-29 | Disposition: A | Discharge status: Home / Self Care | Payer: Medicare Other | Source: Ambulatory Visit | Attending: Radiation Oncology | Admitting: Radiation Oncology

## 2017-12-29 DIAGNOSIS — C672 Malignant neoplasm of lateral wall of bladder: Secondary | ICD-10-CM | POA: Diagnosis not present

## 2017-12-29 DIAGNOSIS — N3000 Acute cystitis without hematuria: Secondary | ICD-10-CM | POA: Diagnosis not present

## 2017-12-29 DIAGNOSIS — R300 Dysuria: Secondary | ICD-10-CM | POA: Diagnosis not present

## 2017-12-29 MED ORDER — PHENAZOPYRIDINE HCL 100 MG PO TABS: 100.0000 mg | ORAL_TABLET | Freq: Three times a day (TID) | ORAL | 1 refills | Status: DC | PRN

## 2017-12-29 MED ORDER — PHENAZOPYRIDINE HCL 200 MG PO TABS: 200.0000 mg | ORAL_TABLET | Freq: Three times a day (TID) | ORAL | 1 refills | Status: DC | PRN

## 2017-12-30 ENCOUNTER — Ambulatory Visit
Admission: RE | Admit: 2017-12-30 | Discharge: 2017-12-30 | Disposition: A | Discharge status: Home / Self Care | Payer: Medicare Other | Source: Ambulatory Visit | Attending: Radiation Oncology | Admitting: Radiation Oncology

## 2017-12-30 DIAGNOSIS — C672 Malignant neoplasm of lateral wall of bladder: Secondary | ICD-10-CM | POA: Diagnosis not present

## 2018-01-02 ENCOUNTER — Inpatient Hospital Stay: Payer: Medicare Other

## 2018-01-02 ENCOUNTER — Ambulatory Visit
Admission: RE | Admit: 2018-01-02 | Discharge: 2018-01-02 | Disposition: A | Discharge status: Home / Self Care | Payer: Medicare Other | Source: Ambulatory Visit | Attending: Radiation Oncology | Admitting: Radiation Oncology

## 2018-01-02 ENCOUNTER — Inpatient Hospital Stay (HOSPITAL_BASED_OUTPATIENT_CLINIC_OR_DEPARTMENT_OTHER): Payer: Medicare Other | Admitting: Oncology

## 2018-01-02 VITALS — BP 142/83 | HR 86 | Temp 98.3°F | Resp 18 | Ht 76.0 in | Wt 196.6 lb

## 2018-01-02 DIAGNOSIS — R300 Dysuria: Secondary | ICD-10-CM | POA: Diagnosis not present

## 2018-01-02 DIAGNOSIS — C672 Malignant neoplasm of lateral wall of bladder: Secondary | ICD-10-CM

## 2018-01-02 DIAGNOSIS — Z79899 Other long term (current) drug therapy: Secondary | ICD-10-CM

## 2018-01-02 DIAGNOSIS — Z5111 Encounter for antineoplastic chemotherapy: Secondary | ICD-10-CM | POA: Diagnosis not present

## 2018-01-02 DIAGNOSIS — N390 Urinary tract infection, site not specified: Secondary | ICD-10-CM

## 2018-01-02 DIAGNOSIS — C679 Malignant neoplasm of bladder, unspecified: Secondary | ICD-10-CM

## 2018-01-02 LAB — CMP (CANCER CENTER ONLY)
ALT: 15 U/L (ref 0–44)
AST: 12 U/L — ABNORMAL LOW (ref 15–41)
Albumin: 3.3 g/dL — ABNORMAL LOW (ref 3.5–5.0)
Alkaline Phosphatase: 68 U/L (ref 38–126)
Anion gap: 7 (ref 5–15)
BUN: 15 mg/dL (ref 8–23)
CO2: 31 mmol/L (ref 22–32)
Calcium: 9.2 mg/dL (ref 8.9–10.3)
Chloride: 101 mmol/L (ref 98–111)
Creatinine: 1.11 mg/dL (ref 0.61–1.24)
GFR, Est AFR Am: >60 mL/min (ref >60)
GFR, Estimated: 58 mL/min — ABNORMAL LOW (ref >60)
Glucose, Bld: 99 mg/dL (ref 70–99)
Potassium: 4.1 mmol/L (ref 3.5–5.1)
Sodium: 139 mmol/L (ref 135–145)
Total Bilirubin: 0.8 mg/dL (ref 0.3–1.2)
Total Protein: 6.2 g/dL — ABNORMAL LOW (ref 6.5–8.1)

## 2018-01-02 LAB — CBC WITH DIFFERENTIAL (CANCER CENTER ONLY)
Abs Immature Granulocytes: 0.03 10*3/uL (ref 0.00–0.07)
Basophils Absolute: 0 10*3/uL (ref 0.0–0.1)
Basophils Relative: 0 %
EOS ABS: 0.2 10*3/uL (ref 0.0–0.5)
Eosinophils Relative: 3 %
HCT: 40.4 % (ref 39.0–52.0)
Hemoglobin: 12.4 g/dL — ABNORMAL LOW (ref 13.0–17.0)
Immature Granulocytes: 1 %
Lymphocytes Relative: 6 %
Lymphs Abs: 0.4 10*3/uL — ABNORMAL LOW (ref 0.7–4.0)
MCH: 27.7 pg (ref 26.0–34.0)
MCHC: 30.7 g/dL (ref 30.0–36.0)
MCV: 90.4 fL (ref 80.0–100.0)
Monocytes Absolute: 0.6 10*3/uL (ref 0.1–1.0)
Monocytes Relative: 9 %
Neutro Abs: 4.7 10*3/uL (ref 1.7–7.7)
Neutrophils Relative %: 81 %
Platelet Count: 200 10*3/uL (ref 150–400)
RBC: 4.47 MIL/uL (ref 4.22–5.81)
RDW: 20.3 % — AB (ref 11.5–15.5)
WBC Count: 5.8 10*3/uL (ref 4.0–10.5)
nRBC: 0 % (ref 0.0–0.2)

## 2018-01-02 MED ORDER — SODIUM CHLORIDE 0.9 % IV SOLN
Freq: Once | INTRAVENOUS | Status: AC
  Administered 2018-01-02: 10:00:00 via INTRAVENOUS
  Filled 2018-01-02: qty 250

## 2018-01-02 MED ORDER — PALONOSETRON HCL INJECTION 0.25 MG/5ML
INTRAVENOUS | Status: AC
  Filled 2018-01-02: qty 5

## 2018-01-02 MED ORDER — SODIUM CHLORIDE 0.9 % IV SOLN
159.2000 mg | Freq: Once | INTRAVENOUS | Status: AC
  Administered 2018-01-02: 160 mg via INTRAVENOUS
  Filled 2018-01-02: qty 16

## 2018-01-02 MED ORDER — DEXAMETHASONE SODIUM PHOSPHATE 10 MG/ML IJ SOLN
10.0000 mg | Freq: Once | INTRAMUSCULAR | Status: AC
  Administered 2018-01-02: 10 mg via INTRAVENOUS

## 2018-01-02 MED ORDER — DEXAMETHASONE SODIUM PHOSPHATE 10 MG/ML IJ SOLN
INTRAMUSCULAR | Status: AC
  Filled 2018-01-02: qty 1

## 2018-01-02 MED ORDER — PALONOSETRON HCL INJECTION 0.25 MG/5ML
0.2500 mg | Freq: Once | INTRAVENOUS | Status: AC
  Administered 2018-01-02: 0.25 mg via INTRAVENOUS

## 2018-01-02 NOTE — Progress Notes (Signed)
Hematology and Oncology Follow Up Visit  Jose Lawson [id number] 06-Oct-1925 84 y.o. 01/02/2018 9:19 AM Jose Lawson, MDPaterson, [name], MD   Principle Diagnosis: 84 year-old man with bladder cancer diagnosed in 2013 after presenting with a superficial bladder tumor.  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    Prior Therapy:  He is status post multiple cystoscopy and fulguration in the past.  He also received BCG 2014 and was discontinued because of fevers. He is status post epirubicin in May 2019 for high-grade T1 disease. TURBT completed on 11/29/2017 in the care of Dr. Junious Silk.  Final pathology showed a muscle invasive tumor.  Current therapy: Definitive therapy with radiation and weekly carboplatin chemotherapy started on 12/19/2017.  Interim History: Mr. Jose Lawson returns today for repeat evaluation.  Since the last visit, he started therapy with radiation and weekly carboplatin without any complications.  He denies any nausea, fatigue or infusion related complications.  He denies any worsening neuropathy.  He did report dysuria associated with radiation and was started on Pyridium with improvement in his symptoms.  He remains in good health and performs activities of daily living without any decline.  He does not report any headaches, blurry vision, syncope or seizures.  He denies any dizziness or lethargy.  Does not report any fevers, chills or sweats.  Does not report any cough, wheezing or hemoptysis.  Does not report any chest pain, palpitation, orthopnea or leg edema.  Does not report any nausea, vomiting or distention.  Does not report any constipation or diarrhea. Does not report any joint pain or deformity.  Does not report frequency, urgency or hematuria.  Does not report any ecchymosis or petechiae. Does not report any skin rashes or lesions.  Denies any anxiety or depression.  Remaining review of systems is negative.    Medications: I have  reviewed the patient's current medications.  Current Outpatient Medications  Medication Sig Dispense Refill  . atorvastatin (LIPITOR) 20 MG tablet Take 20 mg by mouth daily after breakfast.     . ciprofloxacin (CIPRO) 500 MG tablet Take 1 tablet (500 mg total) by mouth 2 (two) times daily. 8 tablet 0  . iron polysaccharides (NIFEREX) 150 MG capsule Take 150 mg by mouth daily.    [name] levothyroxine (SYNTHROID, LEVOTHROID) 175 MCG tablet Take 175 mcg by mouth daily after breakfast.     . nitroGLYCERIN (NITRODUR - DOSED IN MG/24 HR) 0.1 mg/hr patch Place 0.1 mg onto the skin daily.    . nitroGLYCERIN (NITROSTAT) 0.4 MG SL tablet Place 1 tablet (0.4 mg total) under the tongue every 5 (five) minutes as needed for chest pain. 30 tablet 12  . pantoprazole (PROTONIX) 40 MG tablet Take 1 tablet (40 mg total) by mouth daily. (Patient taking differently: Take 40 mg by mouth daily after breakfast. ) 60 tablet 0  . phenazopyridine (PYRIDIUM) 100 MG tablet Take 1 tablet (100 mg total) by mouth 3 (three) times daily as needed for pain. 30 tablet 1  . polyethylene glycol powder (GLYCOLAX/MIRALAX) powder Take 17 g by mouth daily as needed (for constipation).    . prochlorperazine (COMPAZINE) 10 MG tablet Take 1 tablet (10 mg total) by mouth every 6 (six) hours as needed for nausea or vomiting. 30 tablet 0  . sodium chloride (MURO 128) 5 % ophthalmic solution Place 1 drop into both eyes 3 (three) times daily as needed for eye irritation.    [name] STIOLTO RESPIMAT 2.5-2.5 MCG/ACT AERS INHALE 2 PUFFS BY  MOUTH ONCE DAILY (Patient taking differently: Inhale 2 puffs into the lungs daily. ) 4 g 5  . testosterone enanthate (DELATESTRYL) 200 MG/ML injection Inject 200 mg into the muscle every 28 (twenty-eight) days. For IM use only    . triamterene-hydrochlorothiazide (MAXZIDE-25) 37.5-25 MG tablet Take 1 tablet by mouth daily.  0  . vitamin B-12 (CYANOCOBALAMIN) 1000 MCG tablet Take 1,000 mcg by mouth 3 (three) times a week.    .  Vitamin D, Ergocalciferol, (DRISDOL) 50000 units CAPS capsule Take 50,000 Units by mouth every Monday.      No current facility-administered medications for this visit.      Allergies:  Allergies  Allergen Reactions  . Doxycycline     dizziness    Past Medical History, Surgical history, Social history, and Family History were reviewed and updated.  Review of Systems:  Remaining ROS negative.  Physical Exam: Blood pressure (!) 142/83, pulse 86, temperature 98.3 F (36.8 C), resp. rate 18, height 6\' 4"  (1.93 m), weight 196 lb 9.6 oz (89.2 kg), SpO2 93 %.   ECOG: 1   General appearance: Alert, awake without any distress. Head: Atraumatic without abnormalities Oropharynx: Without any thrush or ulcers. Eyes: No scleral icterus. Lymph nodes: No lymphadenopathy noted in the cervical, supraclavicular, or axillary nodes Heart:regular rate and rhythm, without any murmurs or gallops.   Lung: Clear to auscultation without any rhonchi, wheezes or dullness to percussion. Abdomin: Soft, nontender without any shifting dullness or ascites. Musculoskeletal: No clubbing or cyanosis. Neurological: No motor or sensory deficits. Skin: No rashes or lesions. Psychiatric: Mood and affect appeared normal.       Lab Results: Lab Results  Component Value Date   WBC 5.8 01/02/2018   HGB 12.4 (L) 01/02/2018   HCT 40.4 01/02/2018   MCV 90.4 01/02/2018   PLT 200 01/02/2018     Chemistry      Component Value Date/Time   NA 140 12/26/2017 0926   K 4.2 12/26/2017 0926   CL 99 12/26/2017 0926   CO2 30 12/26/2017 0926   BUN 22 12/26/2017 0926   CREATININE 1.24 12/26/2017 0926      Component Value Date/Time   CALCIUM 9.7 12/26/2017 0926   ALKPHOS 83 12/26/2017 0926   AST 11 (L) 12/26/2017 0926   ALT 12 12/26/2017 0926   BILITOT 0.6 12/26/2017 0926        Impression and Plan:  84 year-old man with:  1.  Bladder cancer diagnosed in October 2019.  He was found to have muscle  invasive disease with T2N0 staging.  He is currently receiving definitive therapy with radiation and weekly carboplatin without any complications at this time.  He denies any infusion related issues with carboplatin and overall maintenance of his quality of life.  Risks and benefits of continuing this therapy was reviewed today and alternative regimens were discussed.  After discussion today is agreeable to continue to complete 6 weeks of carboplatin total therapy.  2.  IV access: Peripheral veins remain in use without any issues at this time.  3.  Antiemetics: No nausea or vomiting reported at this time.  Antiemetics are available to him.  4.  Dysuria: Related to radiation therapy and Pyridium has been effective.  5. Follow-up: We will in on a weekly basis for carboplatin therapy and will have MD follow-up on January 13,019.  25  minutes was spent with the patient face-to-face today.  More than 50% of time was dedicated to reviewing his disease status, treatment  options and complications related therapy.     Zola Button, MD 12/23/20199:19 AM

## 2018-01-02 NOTE — Patient Instructions (Signed)
[facility] Discharge Instructions for Patients Receiving Chemotherapy  Today you received the following chemotherapy agents: Carboplatin (Paraplatin)  To help prevent nausea and vomiting after your treatment, we encourage you to take your nausea medication as directed.    If you develop nausea and vomiting that is not controlled by your nausea medication, call the clinic.   BELOW ARE SYMPTOMS THAT SHOULD BE REPORTED IMMEDIATELY:  *FEVER GREATER THAN 100.5 F  *CHILLS WITH OR WITHOUT FEVER  NAUSEA AND VOMITING THAT IS NOT CONTROLLED WITH YOUR NAUSEA MEDICATION  *UNUSUAL SHORTNESS OF BREATH  *UNUSUAL BRUISING OR BLEEDING  TENDERNESS IN MOUTH AND THROAT WITH OR WITHOUT PRESENCE OF ULCERS  *URINARY PROBLEMS  *BOWEL PROBLEMS  UNUSUAL RASH Items with * indicate a potential emergency and should be followed up as soon as possible.  Feel free to call the clinic should you have any questions or concerns. The clinic phone number is [phone].  Please show the CHEMO ALERT CARD at check-in to the Emergency Department and triage nurse.   

## 2018-01-02 NOTE — Addendum Note (Signed)
Addended by: Wyatt Portela on: 01/02/2018 09:52 AM   Modules accepted: Orders

## 2018-01-03 ENCOUNTER — Ambulatory Visit
Admission: RE | Admit: 2018-01-03 | Discharge: 2018-01-03 | Disposition: A | Discharge status: Home / Self Care | Payer: Medicare Other | Source: Ambulatory Visit | Attending: Radiation Oncology | Admitting: Radiation Oncology

## 2018-01-03 DIAGNOSIS — C672 Malignant neoplasm of lateral wall of bladder: Secondary | ICD-10-CM | POA: Diagnosis not present

## 2018-01-05 ENCOUNTER — Telehealth: Payer: Self-pay

## 2018-01-05 ENCOUNTER — Ambulatory Visit
Admission: RE | Admit: 2018-01-05 | Discharge: 2018-01-05 | Disposition: A | Discharge status: Home / Self Care | Payer: Medicare Other | Source: Ambulatory Visit | Attending: Radiation Oncology | Admitting: Radiation Oncology

## 2018-01-05 DIAGNOSIS — C672 Malignant neoplasm of lateral wall of bladder: Secondary | ICD-10-CM | POA: Diagnosis not present

## 2018-01-05 NOTE — Telephone Encounter (Signed)
Per 12/23 no los 

## 2018-01-06 ENCOUNTER — Ambulatory Visit
Admission: RE | Admit: 2018-01-06 | Discharge: 2018-01-06 | Disposition: A | Discharge status: Home / Self Care | Payer: Medicare Other | Source: Ambulatory Visit | Attending: Radiation Oncology | Admitting: Radiation Oncology

## 2018-01-06 DIAGNOSIS — C672 Malignant neoplasm of lateral wall of bladder: Secondary | ICD-10-CM | POA: Diagnosis not present

## 2018-01-09 ENCOUNTER — Inpatient Hospital Stay: Payer: Medicare Other

## 2018-01-09 ENCOUNTER — Ambulatory Visit
Admission: RE | Admit: 2018-01-09 | Discharge: 2018-01-09 | Disposition: A | Discharge status: Home / Self Care | Payer: Medicare Other | Source: Ambulatory Visit | Attending: Radiation Oncology | Admitting: Radiation Oncology

## 2018-01-09 VITALS — BP 145/63 | HR 84 | Temp 98.1°F | Resp 18

## 2018-01-09 DIAGNOSIS — C672 Malignant neoplasm of lateral wall of bladder: Secondary | ICD-10-CM | POA: Diagnosis not present

## 2018-01-09 DIAGNOSIS — C679 Malignant neoplasm of bladder, unspecified: Secondary | ICD-10-CM

## 2018-01-09 DIAGNOSIS — Z5111 Encounter for antineoplastic chemotherapy: Secondary | ICD-10-CM | POA: Diagnosis not present

## 2018-01-09 DIAGNOSIS — Z79899 Other long term (current) drug therapy: Secondary | ICD-10-CM | POA: Diagnosis not present

## 2018-01-09 DIAGNOSIS — R300 Dysuria: Secondary | ICD-10-CM | POA: Diagnosis not present

## 2018-01-09 LAB — CBC WITH DIFFERENTIAL (CANCER CENTER ONLY)
Abs Immature Granulocytes: 0.01 10*3/uL (ref 0.00–0.07)
Basophils Absolute: 0 10*3/uL (ref 0.0–0.1)
Basophils Relative: 0 %
EOS ABS: 0.1 10*3/uL (ref 0.0–0.5)
Eosinophils Relative: 2 %
HCT: 40.8 % (ref 39.0–52.0)
Hemoglobin: 12.7 g/dL — ABNORMAL LOW (ref 13.0–17.0)
Immature Granulocytes: 0 %
Lymphocytes Relative: 8 %
Lymphs Abs: 0.4 10*3/uL — ABNORMAL LOW (ref 0.7–4.0)
MCH: 28.3 pg (ref 26.0–34.0)
MCHC: 31.1 g/dL (ref 30.0–36.0)
MCV: 91.1 fL (ref 80.0–100.0)
Monocytes Absolute: 0.5 10*3/uL (ref 0.1–1.0)
Monocytes Relative: 11 %
Neutro Abs: 3.5 10*3/uL (ref 1.7–7.7)
Neutrophils Relative %: 79 %
Platelet Count: 161 10*3/uL (ref 150–400)
RBC: 4.48 MIL/uL (ref 4.22–5.81)
RDW: 20.5 % — AB (ref 11.5–15.5)
WBC: 4.5 10*3/uL (ref 4.0–10.5)
nRBC: 0 % (ref 0.0–0.2)

## 2018-01-09 LAB — CMP (CANCER CENTER ONLY)
ALK PHOS: 69 U/L (ref 38–126)
ALT: 17 U/L (ref 0–44)
AST: 14 U/L — ABNORMAL LOW (ref 15–41)
Albumin: 3.3 g/dL — ABNORMAL LOW (ref 3.5–5.0)
Anion gap: 9 (ref 5–15)
BUN: 20 mg/dL (ref 8–23)
CO2: 28 mmol/L (ref 22–32)
Calcium: 9.4 mg/dL (ref 8.9–10.3)
Chloride: 102 mmol/L (ref 98–111)
Creatinine: 1.1 mg/dL (ref 0.61–1.24)
GFR, Est AFR Am: >60 mL/min (ref >60)
GFR, Estimated: 59 mL/min — ABNORMAL LOW (ref >60)
Glucose, Bld: 99 mg/dL (ref 70–99)
Potassium: 4.1 mmol/L (ref 3.5–5.1)
Sodium: 139 mmol/L (ref 135–145)
TOTAL PROTEIN: 6.2 g/dL — AB (ref 6.5–8.1)
Total Bilirubin: 0.8 mg/dL (ref 0.3–1.2)

## 2018-01-09 MED ORDER — DEXAMETHASONE SODIUM PHOSPHATE 10 MG/ML IJ SOLN
INTRAMUSCULAR | Status: AC
  Filled 2018-01-09: qty 1

## 2018-01-09 MED ORDER — PALONOSETRON HCL INJECTION 0.25 MG/5ML
INTRAVENOUS | Status: AC
  Filled 2018-01-09: qty 5

## 2018-01-09 MED ORDER — SODIUM CHLORIDE 0.9 % IV SOLN
160.2000 mg | Freq: Once | INTRAVENOUS | Status: AC
  Administered 2018-01-09: 160 mg via INTRAVENOUS
  Filled 2018-01-09: qty 16

## 2018-01-09 MED ORDER — SODIUM CHLORIDE 0.9 % IV SOLN
Freq: Once | INTRAVENOUS | Status: AC
  Administered 2018-01-09: 09:00:00 via INTRAVENOUS
  Filled 2018-01-09: qty 250

## 2018-01-09 MED ORDER — PALONOSETRON HCL INJECTION 0.25 MG/5ML
0.2500 mg | Freq: Once | INTRAVENOUS | Status: AC
  Administered 2018-01-09: 0.25 mg via INTRAVENOUS

## 2018-01-09 MED ORDER — DEXAMETHASONE SODIUM PHOSPHATE 10 MG/ML IJ SOLN
10.0000 mg | Freq: Once | INTRAMUSCULAR | Status: AC
  Administered 2018-01-09: 10 mg via INTRAVENOUS

## 2018-01-09 NOTE — Patient Instructions (Signed)
[facility] Discharge Instructions for Patients Receiving Chemotherapy  Today you received the following chemotherapy agents: carboplatin.  To help prevent nausea and vomiting after your treatment, we encourage you to take your nausea medication as directed.   If you develop nausea and vomiting that is not controlled by your nausea medication, call the clinic.   BELOW ARE SYMPTOMS THAT SHOULD BE REPORTED IMMEDIATELY:  *FEVER GREATER THAN 100.5 F  *CHILLS WITH OR WITHOUT FEVER  NAUSEA AND VOMITING THAT IS NOT CONTROLLED WITH YOUR NAUSEA MEDICATION  *UNUSUAL SHORTNESS OF BREATH  *UNUSUAL BRUISING OR BLEEDING  TENDERNESS IN MOUTH AND THROAT WITH OR WITHOUT PRESENCE OF ULCERS  *URINARY PROBLEMS  *BOWEL PROBLEMS  UNUSUAL RASH Items with * indicate a potential emergency and should be followed up as soon as possible.  Feel free to call the clinic should you have any questions or concerns. The clinic phone number is [phone].  Please show the CHEMO ALERT CARD at check-in to the Emergency Department and triage nurse.   

## 2018-01-10 ENCOUNTER — Ambulatory Visit
Admission: RE | Admit: 2018-01-10 | Discharge: 2018-01-10 | Disposition: A | Discharge status: Home / Self Care | Payer: Medicare Other | Source: Ambulatory Visit | Attending: Radiation Oncology | Admitting: Radiation Oncology

## 2018-01-10 DIAGNOSIS — C672 Malignant neoplasm of lateral wall of bladder: Secondary | ICD-10-CM | POA: Diagnosis not present

## 2018-01-12 ENCOUNTER — Ambulatory Visit
Admission: RE | Admit: 2018-01-12 | Discharge: 2018-01-12 | Disposition: A | Discharge status: Home / Self Care | Payer: Medicare Other | Source: Ambulatory Visit | Attending: Radiation Oncology | Admitting: Radiation Oncology

## 2018-01-12 DIAGNOSIS — C672 Malignant neoplasm of lateral wall of bladder: Secondary | ICD-10-CM | POA: Diagnosis not present

## 2018-01-12 DIAGNOSIS — Z510 Encounter for antineoplastic radiation therapy: Secondary | ICD-10-CM | POA: Diagnosis not present

## 2018-01-12 DIAGNOSIS — R300 Dysuria: Secondary | ICD-10-CM | POA: Insufficient documentation

## 2018-01-13 ENCOUNTER — Ambulatory Visit
Admission: RE | Admit: 2018-01-13 | Discharge: 2018-01-13 | Disposition: A | Discharge status: Home / Self Care | Payer: Medicare Other | Source: Ambulatory Visit | Attending: Radiation Oncology | Admitting: Radiation Oncology

## 2018-01-13 DIAGNOSIS — C672 Malignant neoplasm of lateral wall of bladder: Secondary | ICD-10-CM | POA: Diagnosis not present

## 2018-01-13 DIAGNOSIS — R300 Dysuria: Secondary | ICD-10-CM | POA: Diagnosis not present

## 2018-01-13 DIAGNOSIS — Z510 Encounter for antineoplastic radiation therapy: Secondary | ICD-10-CM | POA: Diagnosis not present

## 2018-01-15 DIAGNOSIS — N179 Acute kidney failure, unspecified: Secondary | ICD-10-CM | POA: Diagnosis not present

## 2018-01-15 DIAGNOSIS — R0602 Shortness of breath: Secondary | ICD-10-CM | POA: Diagnosis not present

## 2018-01-15 DIAGNOSIS — J449 Chronic obstructive pulmonary disease, unspecified: Secondary | ICD-10-CM | POA: Diagnosis not present

## 2018-01-16 ENCOUNTER — Inpatient Hospital Stay: Payer: Medicare Other | Attending: Oncology

## 2018-01-16 ENCOUNTER — Inpatient Hospital Stay: Payer: Medicare Other

## 2018-01-16 ENCOUNTER — Ambulatory Visit
Admission: RE | Admit: 2018-01-16 | Discharge: 2018-01-16 | Disposition: A | Discharge status: Home / Self Care | Payer: Medicare Other | Source: Ambulatory Visit | Attending: Radiation Oncology | Admitting: Radiation Oncology

## 2018-01-16 VITALS — BP 137/66 | HR 81 | Temp 98.2°F | Resp 18 | Ht 76.0 in | Wt 191.0 lb

## 2018-01-16 DIAGNOSIS — C679 Malignant neoplasm of bladder, unspecified: Secondary | ICD-10-CM

## 2018-01-16 DIAGNOSIS — Z79899 Other long term (current) drug therapy: Secondary | ICD-10-CM | POA: Insufficient documentation

## 2018-01-16 DIAGNOSIS — R350 Frequency of micturition: Secondary | ICD-10-CM | POA: Insufficient documentation

## 2018-01-16 DIAGNOSIS — C672 Malignant neoplasm of lateral wall of bladder: Secondary | ICD-10-CM

## 2018-01-16 DIAGNOSIS — Z5111 Encounter for antineoplastic chemotherapy: Secondary | ICD-10-CM | POA: Diagnosis not present

## 2018-01-16 DIAGNOSIS — R300 Dysuria: Secondary | ICD-10-CM | POA: Diagnosis not present

## 2018-01-16 DIAGNOSIS — Z510 Encounter for antineoplastic radiation therapy: Secondary | ICD-10-CM | POA: Diagnosis not present

## 2018-01-16 LAB — CMP (CANCER CENTER ONLY)
ALK PHOS: 71 U/L (ref 38–126)
ALT: 17 U/L (ref 0–44)
AST: 15 U/L (ref 15–41)
Albumin: 3.3 g/dL — ABNORMAL LOW (ref 3.5–5.0)
Anion gap: 9 (ref 5–15)
BUN: 23 mg/dL (ref 8–23)
CO2: 29 mmol/L (ref 22–32)
CREATININE: 1.15 mg/dL (ref 0.61–1.24)
Calcium: 9.5 mg/dL (ref 8.9–10.3)
Chloride: 102 mmol/L (ref 98–111)
GFR, Est AFR Am: >60 mL/min (ref >60)
GFR, Estimated: 56 mL/min — ABNORMAL LOW (ref >60)
Glucose, Bld: 88 mg/dL (ref 70–99)
Potassium: 4 mmol/L (ref 3.5–5.1)
Sodium: 140 mmol/L (ref 135–145)
Total Bilirubin: 0.8 mg/dL (ref 0.3–1.2)
Total Protein: 6.3 g/dL — ABNORMAL LOW (ref 6.5–8.1)

## 2018-01-16 LAB — CBC WITH DIFFERENTIAL (CANCER CENTER ONLY)
Abs Immature Granulocytes: 0.02 10*3/uL (ref 0.00–0.07)
Basophils Absolute: 0 10*3/uL (ref 0.0–0.1)
Basophils Relative: 0 %
EOS ABS: 0.1 10*3/uL (ref 0.0–0.5)
EOS PCT: 2 %
HCT: 41.4 % (ref 39.0–52.0)
Hemoglobin: 13 g/dL (ref 13.0–17.0)
IMMATURE GRANULOCYTES: 1 %
Lymphocytes Relative: 8 %
Lymphs Abs: 0.3 10*3/uL — ABNORMAL LOW (ref 0.7–4.0)
MCH: 28.9 pg (ref 26.0–34.0)
MCHC: 31.4 g/dL (ref 30.0–36.0)
MCV: 92 fL (ref 80.0–100.0)
Monocytes Absolute: 0.5 10*3/uL (ref 0.1–1.0)
Monocytes Relative: 11 %
Neutro Abs: 3.3 10*3/uL (ref 1.7–7.7)
Neutrophils Relative %: 78 %
Platelet Count: 199 10*3/uL (ref 150–400)
RBC: 4.5 MIL/uL (ref 4.22–5.81)
RDW: 20.5 % — AB (ref 11.5–15.5)
WBC Count: 4.2 10*3/uL (ref 4.0–10.5)
nRBC: 0 % (ref 0.0–0.2)

## 2018-01-16 MED ORDER — PALONOSETRON HCL INJECTION 0.25 MG/5ML
0.2500 mg | Freq: Once | INTRAVENOUS | Status: AC
  Administered 2018-01-16: 0.25 mg via INTRAVENOUS

## 2018-01-16 MED ORDER — PALONOSETRON HCL INJECTION 0.25 MG/5ML
INTRAVENOUS | Status: AC
  Filled 2018-01-16: qty 5

## 2018-01-16 MED ORDER — SODIUM CHLORIDE 0.9 % IV SOLN
155.4000 mg | Freq: Once | INTRAVENOUS | Status: AC
  Administered 2018-01-16: 160 mg via INTRAVENOUS
  Filled 2018-01-16: qty 16

## 2018-01-16 MED ORDER — SODIUM CHLORIDE 0.9 % IV SOLN
Freq: Once | INTRAVENOUS | Status: AC
  Administered 2018-01-16: 11:00:00 via INTRAVENOUS
  Filled 2018-01-16: qty 250

## 2018-01-16 MED ORDER — DEXAMETHASONE SODIUM PHOSPHATE 10 MG/ML IJ SOLN
10.0000 mg | Freq: Once | INTRAMUSCULAR | Status: AC
  Administered 2018-01-16: 10 mg via INTRAVENOUS

## 2018-01-16 MED ORDER — DEXAMETHASONE SODIUM PHOSPHATE 10 MG/ML IJ SOLN
INTRAMUSCULAR | Status: AC
  Filled 2018-01-16: qty 1

## 2018-01-16 NOTE — Patient Instructions (Signed)
[facility] Discharge Instructions for Patients Receiving Chemotherapy  Today you received the following chemotherapy agents: Carboplatin (Paraplatin)  To help prevent nausea and vomiting after your treatment, we encourage you to take your nausea medication as directed.    If you develop nausea and vomiting that is not controlled by your nausea medication, call the clinic.   BELOW ARE SYMPTOMS THAT SHOULD BE REPORTED IMMEDIATELY:  *FEVER GREATER THAN 100.5 F  *CHILLS WITH OR WITHOUT FEVER  NAUSEA AND VOMITING THAT IS NOT CONTROLLED WITH YOUR NAUSEA MEDICATION  *UNUSUAL SHORTNESS OF BREATH  *UNUSUAL BRUISING OR BLEEDING  TENDERNESS IN MOUTH AND THROAT WITH OR WITHOUT PRESENCE OF ULCERS  *URINARY PROBLEMS  *BOWEL PROBLEMS  UNUSUAL RASH Items with * indicate a potential emergency and should be followed up as soon as possible.  Feel free to call the clinic should you have any questions or concerns. The clinic phone number is [phone].  Please show the CHEMO ALERT CARD at check-in to the Emergency Department and triage nurse.   

## 2018-01-17 ENCOUNTER — Ambulatory Visit
Admission: RE | Admit: 2018-01-17 | Discharge: 2018-01-17 | Disposition: A | Discharge status: Home / Self Care | Payer: Medicare Other | Source: Ambulatory Visit | Attending: Radiation Oncology | Admitting: Radiation Oncology

## 2018-01-17 DIAGNOSIS — R300 Dysuria: Secondary | ICD-10-CM | POA: Diagnosis not present

## 2018-01-17 DIAGNOSIS — C672 Malignant neoplasm of lateral wall of bladder: Secondary | ICD-10-CM | POA: Diagnosis not present

## 2018-01-17 DIAGNOSIS — Z510 Encounter for antineoplastic radiation therapy: Secondary | ICD-10-CM | POA: Diagnosis not present

## 2018-01-18 ENCOUNTER — Ambulatory Visit
Admission: RE | Admit: 2018-01-18 | Discharge: 2018-01-18 | Disposition: A | Discharge status: Home / Self Care | Payer: Medicare Other | Source: Ambulatory Visit | Attending: Radiation Oncology | Admitting: Radiation Oncology

## 2018-01-18 DIAGNOSIS — R300 Dysuria: Secondary | ICD-10-CM | POA: Diagnosis not present

## 2018-01-18 DIAGNOSIS — C672 Malignant neoplasm of lateral wall of bladder: Secondary | ICD-10-CM | POA: Diagnosis not present

## 2018-01-18 DIAGNOSIS — Z510 Encounter for antineoplastic radiation therapy: Secondary | ICD-10-CM | POA: Diagnosis not present

## 2018-01-19 ENCOUNTER — Ambulatory Visit
Admission: RE | Admit: 2018-01-19 | Discharge: 2018-01-19 | Disposition: A | Discharge status: Home / Self Care | Payer: Medicare Other | Source: Ambulatory Visit | Attending: Radiation Oncology | Admitting: Radiation Oncology

## 2018-01-19 DIAGNOSIS — C672 Malignant neoplasm of lateral wall of bladder: Secondary | ICD-10-CM | POA: Diagnosis not present

## 2018-01-19 DIAGNOSIS — Z510 Encounter for antineoplastic radiation therapy: Secondary | ICD-10-CM | POA: Diagnosis not present

## 2018-01-19 DIAGNOSIS — R300 Dysuria: Secondary | ICD-10-CM | POA: Diagnosis not present

## 2018-01-20 ENCOUNTER — Ambulatory Visit
Admission: RE | Admit: 2018-01-20 | Discharge: 2018-01-20 | Disposition: A | Discharge status: Home / Self Care | Payer: Medicare Other | Source: Ambulatory Visit | Attending: Radiation Oncology | Admitting: Radiation Oncology

## 2018-01-20 DIAGNOSIS — R300 Dysuria: Secondary | ICD-10-CM | POA: Diagnosis not present

## 2018-01-20 DIAGNOSIS — Z510 Encounter for antineoplastic radiation therapy: Secondary | ICD-10-CM | POA: Diagnosis not present

## 2018-01-20 DIAGNOSIS — C672 Malignant neoplasm of lateral wall of bladder: Secondary | ICD-10-CM | POA: Diagnosis not present

## 2018-01-20 LAB — URINALYSIS, COMPLETE (UACMP) WITH MICROSCOPIC
Bilirubin Urine: NEGATIVE
Glucose, UA: NEGATIVE mg/dL
KETONES UR: NEGATIVE mg/dL
NITRITE: POSITIVE — AB
Protein, ur: 100 mg/dL — AB
RBC / HPF: >50 RBC/hpf — ABNORMAL HIGH (ref 0–5)
Specific Gravity, Urine: 1.015 (ref 1.005–1.030)
WBC, UA: >50 WBC/hpf — ABNORMAL HIGH (ref 0–5)
pH: 6 (ref 5.0–8.0)

## 2018-01-22 LAB — URINE CULTURE: Culture: 50000 — AB

## 2018-01-23 ENCOUNTER — Ambulatory Visit
Admission: RE | Admit: 2018-01-23 | Discharge: 2018-01-23 | Disposition: A | Discharge status: Home / Self Care | Payer: Medicare Other | Source: Ambulatory Visit | Attending: Radiation Oncology | Admitting: Radiation Oncology

## 2018-01-23 ENCOUNTER — Inpatient Hospital Stay: Payer: Medicare Other

## 2018-01-23 ENCOUNTER — Telehealth: Payer: Self-pay | Admitting: Oncology

## 2018-01-23 ENCOUNTER — Inpatient Hospital Stay (HOSPITAL_BASED_OUTPATIENT_CLINIC_OR_DEPARTMENT_OTHER): Payer: Medicare Other | Admitting: Oncology

## 2018-01-23 ENCOUNTER — Other Ambulatory Visit: Payer: Self-pay | Admitting: Radiation Oncology

## 2018-01-23 VITALS — BP 121/67 | HR 94 | Temp 97.9°F | Resp 18 | Ht 76.0 in | Wt 189.7 lb

## 2018-01-23 DIAGNOSIS — R300 Dysuria: Secondary | ICD-10-CM

## 2018-01-23 DIAGNOSIS — R350 Frequency of micturition: Secondary | ICD-10-CM

## 2018-01-23 DIAGNOSIS — Z5111 Encounter for antineoplastic chemotherapy: Secondary | ICD-10-CM

## 2018-01-23 DIAGNOSIS — C672 Malignant neoplasm of lateral wall of bladder: Secondary | ICD-10-CM

## 2018-01-23 DIAGNOSIS — Z510 Encounter for antineoplastic radiation therapy: Secondary | ICD-10-CM | POA: Diagnosis not present

## 2018-01-23 DIAGNOSIS — Z79899 Other long term (current) drug therapy: Secondary | ICD-10-CM

## 2018-01-23 DIAGNOSIS — C679 Malignant neoplasm of bladder, unspecified: Secondary | ICD-10-CM

## 2018-01-23 LAB — CMP (CANCER CENTER ONLY)
ALT: 23 U/L (ref 0–44)
AST: 18 U/L (ref 15–41)
Albumin: 3.6 g/dL (ref 3.5–5.0)
Alkaline Phosphatase: 74 U/L (ref 38–126)
Anion gap: 8 (ref 5–15)
BUN: 29 mg/dL — AB (ref 8–23)
CALCIUM: 9.4 mg/dL (ref 8.9–10.3)
CO2: 29 mmol/L (ref 22–32)
Chloride: 101 mmol/L (ref 98–111)
Creatinine: 1.24 mg/dL (ref 0.61–1.24)
GFR, Est AFR Am: 59 mL/min — ABNORMAL LOW (ref >60)
GFR, Estimated: 51 mL/min — ABNORMAL LOW (ref >60)
Glucose, Bld: 124 mg/dL — ABNORMAL HIGH (ref 70–99)
Potassium: 3.9 mmol/L (ref 3.5–5.1)
Sodium: 138 mmol/L (ref 135–145)
Total Bilirubin: 1 mg/dL (ref 0.3–1.2)
Total Protein: 6.5 g/dL (ref 6.5–8.1)

## 2018-01-23 LAB — CBC WITH DIFFERENTIAL (CANCER CENTER ONLY)
Abs Immature Granulocytes: 0.01 10*3/uL (ref 0.00–0.07)
Basophils Absolute: 0 10*3/uL (ref 0.0–0.1)
Basophils Relative: 0 %
Eosinophils Absolute: 0.1 10*3/uL (ref 0.0–0.5)
Eosinophils Relative: 2 %
HCT: 40.3 % (ref 39.0–52.0)
Hemoglobin: 12.8 g/dL — ABNORMAL LOW (ref 13.0–17.0)
Immature Granulocytes: 0 %
LYMPHS PCT: 4 %
Lymphs Abs: 0.2 10*3/uL — ABNORMAL LOW (ref 0.7–4.0)
MCH: 29.2 pg (ref 26.0–34.0)
MCHC: 31.8 g/dL (ref 30.0–36.0)
MCV: 91.8 fL (ref 80.0–100.0)
Monocytes Absolute: 0.5 10*3/uL (ref 0.1–1.0)
Monocytes Relative: 11 %
Neutro Abs: 3.8 10*3/uL (ref 1.7–7.7)
Neutrophils Relative %: 83 %
Platelet Count: 200 10*3/uL (ref 150–400)
RBC: 4.39 MIL/uL (ref 4.22–5.81)
RDW: 19.9 % — ABNORMAL HIGH (ref 11.5–15.5)
WBC Count: 4.6 10*3/uL (ref 4.0–10.5)
nRBC: 0 % (ref 0.0–0.2)

## 2018-01-23 MED ORDER — SODIUM CHLORIDE 0.9 % IV SOLN
147.8000 mg | Freq: Once | INTRAVENOUS | Status: AC
  Administered 2018-01-23: 150 mg via INTRAVENOUS
  Filled 2018-01-23: qty 15

## 2018-01-23 MED ORDER — DEXAMETHASONE SODIUM PHOSPHATE 10 MG/ML IJ SOLN
10.0000 mg | Freq: Once | INTRAMUSCULAR | Status: AC
  Administered 2018-01-23: 10 mg via INTRAVENOUS

## 2018-01-23 MED ORDER — PALONOSETRON HCL INJECTION 0.25 MG/5ML
0.2500 mg | Freq: Once | INTRAVENOUS | Status: AC
  Administered 2018-01-23: 0.25 mg via INTRAVENOUS

## 2018-01-23 MED ORDER — DEXAMETHASONE SODIUM PHOSPHATE 10 MG/ML IJ SOLN
INTRAMUSCULAR | Status: AC
  Filled 2018-01-23: qty 1

## 2018-01-23 MED ORDER — PALONOSETRON HCL INJECTION 0.25 MG/5ML
INTRAVENOUS | Status: AC
  Filled 2018-01-23: qty 5

## 2018-01-23 MED ORDER — SODIUM CHLORIDE 0.9 % IV SOLN
Freq: Once | INTRAVENOUS | Status: AC
  Administered 2018-01-23: 10:00:00 via INTRAVENOUS
  Filled 2018-01-23: qty 250

## 2018-01-23 NOTE — Telephone Encounter (Signed)
Printed calendar and avs. °

## 2018-01-23 NOTE — Progress Notes (Signed)
Hematology and Oncology Follow Up Visit  Jose Lawson [id number] 1925-02-07 85 y.o. 01/23/2018 8:58 AM [name], MDPaterson, [name], MD   Principle Diagnosis: 85 year-old man with T2N0 bladder cancer diagnosed in 2019.  Initially presented with superficial bladder tumor in 2013.     Prior Therapy:  He is status post multiple cystoscopy and fulguration in the past.  He also received BCG 2014 and was discontinued because of fevers. He is status post epirubicin in May 2019 for high-grade T1 disease. TURBT completed on 11/29/2017 in the care of Dr. Junious Silk.  Final pathology showed a muscle invasive tumor.  Current therapy: Radiation and weekly carboplatin chemotherapy started on 12/19/2017.  He is here for a week 6 of carboplatin therapy.  Interim History: [name] presents today for a repeat evaluation.  Since the last visit, he reports no major complications related to carboplatin.  He denies any nausea, fatigue or infusion related complications.  He denies any worsening peripheral neuropathy.  His performance status and activity level remains unchanged.  He does report some frequency of urination as well as insomnia associated with premedication of chemotherapy.  Otherwise his appetite is excellent and his performance status remains unchanged.  He denies any worsening diarrhea.  He does not report any headaches, blurry vision, syncope or seizures.  He denies any alteration in mental status or dizziness.  Does not report any fevers, chills or sweats.  Does not report any cough, wheezing or hemoptysis.  Does not report any chest pain, palpitation, orthopnea or leg edema.  Does not report any nausea, vomiting or early satiety.  Does not report any changes in bowel habits. Does not report any arthralgias or myalgias.  Does not report frequency, urgency or hematuria.  Does not report any bleeding or clotting tendency.  Denies any mood changes. Remaining review of systems is negative.     Medications: I have reviewed the patient's current medications.  Current Outpatient Medications  Medication Sig Dispense Refill  . atorvastatin (LIPITOR) 20 MG tablet Take 20 mg by mouth daily after breakfast.     . iron polysaccharides (NIFEREX) 150 MG capsule Take 150 mg by mouth daily.    [name] levothyroxine (SYNTHROID, LEVOTHROID) 175 MCG tablet Take 175 mcg by mouth daily after breakfast.     . nitroGLYCERIN (NITRODUR - DOSED IN MG/24 HR) 0.1 mg/hr patch Place 0.1 mg onto the skin daily.    . nitroGLYCERIN (NITROSTAT) 0.4 MG SL tablet Place 1 tablet (0.4 mg total) under the tongue every 5 (five) minutes as needed for chest pain. 30 tablet 12  . pantoprazole (PROTONIX) 40 MG tablet Take 1 tablet (40 mg total) by mouth daily. (Patient taking differently: Take 40 mg by mouth daily after breakfast. ) 60 tablet 0  . phenazopyridine (PYRIDIUM) 100 MG tablet Take 1 tablet (100 mg total) by mouth 3 (three) times daily as needed for pain. 30 tablet 1  . polyethylene glycol powder (GLYCOLAX/MIRALAX) powder Take 17 g by mouth daily as needed (for constipation).    . prochlorperazine (COMPAZINE) 10 MG tablet Take 1 tablet (10 mg total) by mouth every 6 (six) hours as needed for nausea or vomiting. 30 tablet 0  . sodium chloride (MURO 128) 5 % ophthalmic solution Place 1 drop into both eyes 3 (three) times daily as needed for eye irritation.    [name] testosterone enanthate (DELATESTRYL) 200 MG/ML injection Inject 200 mg into the muscle every 28 (twenty-eight) days. For IM use only    .  triamterene-hydrochlorothiazide (MAXZIDE-25) 37.5-25 MG tablet Take 1 tablet by mouth daily.  0  . vitamin B-12 (CYANOCOBALAMIN) 1000 MCG tablet Take 1,000 mcg by mouth 3 (three) times a week.    . Vitamin D, Ergocalciferol, (DRISDOL) 50000 units CAPS capsule Take 50,000 Units by mouth every Monday.      No current facility-administered medications for this visit.      Allergies:  Allergies  Allergen Reactions  .  Doxycycline     dizziness    Past Medical History, Surgical history, Social history, and Family History were reviewed and updated.    Physical Exam: Blood pressure 121/67, pulse 94, temperature 97.9 F (36.6 C), temperature source Oral, resp. rate 18, height 6\' 4"  (1.93 m), weight 189 lb 11.2 oz (86 kg), SpO2 92 %.    ECOG: 1   General appearance: Comfortable appearing without any discomfort Head: Normocephalic without any trauma Oropharynx: Mucous membranes are moist and pink without any thrush or ulcers. Eyes: Pupils are equal and round reactive to light. Lymph nodes: No cervical, supraclavicular, inguinal or axillary lymphadenopathy.   Heart:regular rate and rhythm.  S1 and S2 without leg edema. Lung: Clear without any rhonchi or wheezes.  No dullness to percussion. Abdomin: Soft, nontender, nondistended with good bowel sounds.  No hepatosplenomegaly. Musculoskeletal: No joint deformity or effusion.  Full range of motion noted. Neurological: No deficits noted on motor, sensory and deep tendon reflex exam. Skin: No petechial rash or dryness.  Appeared moist.        Lab Results: Lab Results  Component Value Date   WBC 4.2 01/16/2018   HGB 13.0 01/16/2018   HCT 41.4 01/16/2018   MCV 92.0 01/16/2018   PLT 199 01/16/2018     Chemistry      Component Value Date/Time   NA 140 01/16/2018 0923   K 4.0 01/16/2018 0923   CL 102 01/16/2018 0923   CO2 29 01/16/2018 0923   BUN 23 01/16/2018 0923   CREATININE 1.15 01/16/2018 0923      Component Value Date/Time   CALCIUM 9.5 01/16/2018 0923   ALKPHOS 71 01/16/2018 0923   AST 15 01/16/2018 0923   ALT 17 01/16/2018 0923   BILITOT 0.8 01/16/2018 0923        Impression and Plan:  85 year-old man with:  1.  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    He continues to tolerate definitive therapy utilizing radiation and weekly carboplatin.  Risks and benefits of continuing  this approach was discussed today.  Plan is to proceed with week 7 of carboplatin today as scheduled and the plan is to continue with radiation for the remainder of this month.  Complications associated with adding additional chemotherapy was reviewed today and we have opted to and carboplatin infusion after today.  The plan is to repeat imaging studies in February for staging purposes.  He is agreeable with this plan.  He will require cystoscopy as well once therapy is complete.  2.  IV access: Peripheral veins remain adequate at this time.  No need for a Port-A-Cath insertion.  3.  Antiemetics: Antiemetics are available to him without any issues with nausea and vomiting.  4.  Urinary frequency: Related to therapy anticipate improvement at this time.  He does use.  He and which have helped his dysuria.  5.  Goals of care: Therapy remains curative at this time.  And aggressive therapy is warranted.  6. Follow-up: We will in February 2020  after repeat CT scan for staging purposes.  25  minutes was spent with the patient face-to-face today.  More than 50% of time was spent on reviewing laboratory data, nitroglycerin for his disease, treatment options and coordinating plan of care.     Zola Button, MD 1/13/20208:58 AM

## 2018-01-23 NOTE — Patient Instructions (Signed)
[facility] Discharge Instructions for Patients Receiving Chemotherapy  Today you received the following chemotherapy agents: Carboplatin (Paraplatin)  To help prevent nausea and vomiting after your treatment, we encourage you to take your nausea medication as directed.    If you develop nausea and vomiting that is not controlled by your nausea medication, call the clinic.   BELOW ARE SYMPTOMS THAT SHOULD BE REPORTED IMMEDIATELY:  *FEVER GREATER THAN 100.5 F  *CHILLS WITH OR WITHOUT FEVER  NAUSEA AND VOMITING THAT IS NOT CONTROLLED WITH YOUR NAUSEA MEDICATION  *UNUSUAL SHORTNESS OF BREATH  *UNUSUAL BRUISING OR BLEEDING  TENDERNESS IN MOUTH AND THROAT WITH OR WITHOUT PRESENCE OF ULCERS  *URINARY PROBLEMS  *BOWEL PROBLEMS  UNUSUAL RASH Items with * indicate a potential emergency and should be followed up as soon as possible.  Feel free to call the clinic should you have any questions or concerns. The clinic phone number is [phone].  Please show the CHEMO ALERT CARD at check-in to the Emergency Department and triage nurse.   

## 2018-01-24 ENCOUNTER — Ambulatory Visit
Admission: RE | Admit: 2018-01-24 | Discharge: 2018-01-24 | Disposition: A | Discharge status: Home / Self Care | Payer: Medicare Other | Source: Ambulatory Visit | Attending: Radiation Oncology | Admitting: Radiation Oncology

## 2018-01-24 DIAGNOSIS — E291 Testicular hypofunction: Secondary | ICD-10-CM | POA: Diagnosis not present

## 2018-01-24 DIAGNOSIS — Z510 Encounter for antineoplastic radiation therapy: Secondary | ICD-10-CM | POA: Diagnosis not present

## 2018-01-24 DIAGNOSIS — R300 Dysuria: Secondary | ICD-10-CM | POA: Diagnosis not present

## 2018-01-24 DIAGNOSIS — C672 Malignant neoplasm of lateral wall of bladder: Secondary | ICD-10-CM | POA: Diagnosis not present

## 2018-01-25 ENCOUNTER — Ambulatory Visit
Admission: RE | Admit: 2018-01-25 | Discharge: 2018-01-25 | Disposition: A | Discharge status: Home / Self Care | Payer: Medicare Other | Source: Ambulatory Visit | Attending: Radiation Oncology | Admitting: Radiation Oncology

## 2018-01-25 DIAGNOSIS — Z510 Encounter for antineoplastic radiation therapy: Secondary | ICD-10-CM | POA: Diagnosis not present

## 2018-01-25 DIAGNOSIS — C672 Malignant neoplasm of lateral wall of bladder: Secondary | ICD-10-CM | POA: Diagnosis not present

## 2018-01-25 DIAGNOSIS — R300 Dysuria: Secondary | ICD-10-CM | POA: Diagnosis not present

## 2018-01-26 ENCOUNTER — Other Ambulatory Visit: Payer: Self-pay | Admitting: Radiation Oncology

## 2018-01-26 ENCOUNTER — Ambulatory Visit
Admission: RE | Admit: 2018-01-26 | Discharge: 2018-01-26 | Disposition: A | Discharge status: Home / Self Care | Payer: Medicare Other | Source: Ambulatory Visit | Attending: Radiation Oncology | Admitting: Radiation Oncology

## 2018-01-26 DIAGNOSIS — G5602 Carpal tunnel syndrome, left upper limb: Secondary | ICD-10-CM | POA: Diagnosis not present

## 2018-01-26 DIAGNOSIS — Z510 Encounter for antineoplastic radiation therapy: Secondary | ICD-10-CM | POA: Diagnosis not present

## 2018-01-26 DIAGNOSIS — R300 Dysuria: Secondary | ICD-10-CM | POA: Diagnosis not present

## 2018-01-26 DIAGNOSIS — M1712 Unilateral primary osteoarthritis, left knee: Secondary | ICD-10-CM | POA: Diagnosis not present

## 2018-01-26 DIAGNOSIS — M1711 Unilateral primary osteoarthritis, right knee: Secondary | ICD-10-CM | POA: Diagnosis not present

## 2018-01-26 DIAGNOSIS — G5601 Carpal tunnel syndrome, right upper limb: Secondary | ICD-10-CM | POA: Diagnosis not present

## 2018-01-26 DIAGNOSIS — C672 Malignant neoplasm of lateral wall of bladder: Secondary | ICD-10-CM | POA: Diagnosis not present

## 2018-01-26 MED ORDER — PHENAZOPYRIDINE HCL 100 MG PO TABS: 100.0000 mg | ORAL_TABLET | Freq: Three times a day (TID) | ORAL | 1 refills | Status: AC | PRN

## 2018-01-26 MED ORDER — MIRABEGRON ER 25 MG PO TB24: 25.0000 mg | ORAL_TABLET | Freq: Every day | ORAL | 1 refills | Status: DC

## 2018-01-27 ENCOUNTER — Ambulatory Visit: Payer: Medicare Other

## 2018-01-30 ENCOUNTER — Ambulatory Visit: Payer: Medicare Other

## 2018-01-31 ENCOUNTER — Ambulatory Visit: Payer: Medicare Other

## 2018-01-31 ENCOUNTER — Ambulatory Visit
Admission: RE | Admit: 2018-01-31 | Discharge: 2018-01-31 | Disposition: A | Discharge status: Home / Self Care | Payer: Medicare Other | Source: Ambulatory Visit | Attending: Radiation Oncology | Admitting: Radiation Oncology

## 2018-01-31 DIAGNOSIS — C672 Malignant neoplasm of lateral wall of bladder: Secondary | ICD-10-CM | POA: Diagnosis not present

## 2018-01-31 DIAGNOSIS — Z510 Encounter for antineoplastic radiation therapy: Secondary | ICD-10-CM | POA: Diagnosis not present

## 2018-01-31 DIAGNOSIS — R300 Dysuria: Secondary | ICD-10-CM

## 2018-01-31 LAB — URINALYSIS, COMPLETE (UACMP) WITH MICROSCOPIC
Bilirubin Urine: NEGATIVE
Glucose, UA: NEGATIVE mg/dL
Ketones, ur: NEGATIVE mg/dL
NITRITE: POSITIVE — AB
Protein, ur: 100 mg/dL — AB
RBC / HPF: >50 RBC/hpf — ABNORMAL HIGH (ref 0–5)
SPECIFIC GRAVITY, URINE: 1.014 (ref 1.005–1.030)
WBC, UA: >50 WBC/hpf — ABNORMAL HIGH (ref 0–5)
pH: 6 (ref 5.0–8.0)

## 2018-02-01 ENCOUNTER — Ambulatory Visit
Admission: RE | Admit: 2018-02-01 | Discharge: 2018-02-01 | Disposition: A | Discharge status: Home / Self Care | Payer: Medicare Other | Source: Ambulatory Visit | Attending: Radiation Oncology | Admitting: Radiation Oncology

## 2018-02-01 DIAGNOSIS — R300 Dysuria: Secondary | ICD-10-CM | POA: Diagnosis not present

## 2018-02-01 DIAGNOSIS — C672 Malignant neoplasm of lateral wall of bladder: Secondary | ICD-10-CM | POA: Diagnosis not present

## 2018-02-01 DIAGNOSIS — Z510 Encounter for antineoplastic radiation therapy: Secondary | ICD-10-CM | POA: Diagnosis not present

## 2018-02-02 ENCOUNTER — Ambulatory Visit
Admission: RE | Admit: 2018-02-02 | Discharge: 2018-02-02 | Disposition: A | Discharge status: Home / Self Care | Payer: Medicare Other | Source: Ambulatory Visit | Attending: Radiation Oncology | Admitting: Radiation Oncology

## 2018-02-02 DIAGNOSIS — Z510 Encounter for antineoplastic radiation therapy: Secondary | ICD-10-CM | POA: Diagnosis not present

## 2018-02-02 DIAGNOSIS — R300 Dysuria: Secondary | ICD-10-CM | POA: Diagnosis not present

## 2018-02-02 DIAGNOSIS — C672 Malignant neoplasm of lateral wall of bladder: Secondary | ICD-10-CM | POA: Diagnosis not present

## 2018-02-02 LAB — URINE CULTURE: Culture: 30000 — AB

## 2018-02-03 ENCOUNTER — Ambulatory Visit
Admission: RE | Admit: 2018-02-03 | Discharge: 2018-02-03 | Disposition: A | Discharge status: Home / Self Care | Payer: Medicare Other | Source: Ambulatory Visit | Attending: Radiation Oncology | Admitting: Radiation Oncology

## 2018-02-03 DIAGNOSIS — Z510 Encounter for antineoplastic radiation therapy: Secondary | ICD-10-CM | POA: Diagnosis not present

## 2018-02-03 DIAGNOSIS — R300 Dysuria: Secondary | ICD-10-CM | POA: Diagnosis not present

## 2018-02-03 DIAGNOSIS — C672 Malignant neoplasm of lateral wall of bladder: Secondary | ICD-10-CM | POA: Diagnosis not present

## 2018-02-06 ENCOUNTER — Ambulatory Visit
Admission: RE | Admit: 2018-02-06 | Discharge: 2018-02-06 | Disposition: A | Discharge status: Home / Self Care | Payer: Medicare Other | Source: Ambulatory Visit | Attending: Radiation Oncology | Admitting: Radiation Oncology

## 2018-02-06 DIAGNOSIS — R300 Dysuria: Secondary | ICD-10-CM | POA: Diagnosis not present

## 2018-02-06 DIAGNOSIS — Z510 Encounter for antineoplastic radiation therapy: Secondary | ICD-10-CM | POA: Diagnosis not present

## 2018-02-06 DIAGNOSIS — C672 Malignant neoplasm of lateral wall of bladder: Secondary | ICD-10-CM | POA: Diagnosis not present

## 2018-02-07 ENCOUNTER — Ambulatory Visit
Admission: RE | Admit: 2018-02-07 | Discharge: 2018-02-07 | Disposition: A | Discharge status: Home / Self Care | Payer: Medicare Other | Source: Ambulatory Visit | Attending: Radiation Oncology | Admitting: Radiation Oncology

## 2018-02-07 DIAGNOSIS — R300 Dysuria: Secondary | ICD-10-CM | POA: Diagnosis not present

## 2018-02-07 DIAGNOSIS — Z510 Encounter for antineoplastic radiation therapy: Secondary | ICD-10-CM | POA: Diagnosis not present

## 2018-02-07 DIAGNOSIS — C672 Malignant neoplasm of lateral wall of bladder: Secondary | ICD-10-CM | POA: Diagnosis not present

## 2018-02-08 ENCOUNTER — Ambulatory Visit
Admission: RE | Admit: 2018-02-08 | Discharge: 2018-02-08 | Disposition: A | Discharge status: Home / Self Care | Payer: Medicare Other | Source: Ambulatory Visit | Attending: Radiation Oncology | Admitting: Radiation Oncology

## 2018-02-08 DIAGNOSIS — R300 Dysuria: Secondary | ICD-10-CM | POA: Diagnosis not present

## 2018-02-08 DIAGNOSIS — Z510 Encounter for antineoplastic radiation therapy: Secondary | ICD-10-CM | POA: Diagnosis not present

## 2018-02-08 DIAGNOSIS — C672 Malignant neoplasm of lateral wall of bladder: Secondary | ICD-10-CM | POA: Diagnosis not present

## 2018-02-09 ENCOUNTER — Ambulatory Visit
Admission: RE | Admit: 2018-02-09 | Discharge: 2018-02-09 | Disposition: A | Discharge status: Home / Self Care | Payer: Medicare Other | Source: Ambulatory Visit | Attending: Radiation Oncology | Admitting: Radiation Oncology

## 2018-02-09 DIAGNOSIS — Z510 Encounter for antineoplastic radiation therapy: Secondary | ICD-10-CM | POA: Diagnosis not present

## 2018-02-09 DIAGNOSIS — C672 Malignant neoplasm of lateral wall of bladder: Secondary | ICD-10-CM | POA: Diagnosis not present

## 2018-02-09 DIAGNOSIS — R300 Dysuria: Secondary | ICD-10-CM | POA: Diagnosis not present

## 2018-02-10 ENCOUNTER — Ambulatory Visit: Payer: Medicare Other

## 2018-02-10 ENCOUNTER — Ambulatory Visit
Admission: RE | Admit: 2018-02-10 | Discharge: 2018-02-10 | Disposition: A | Discharge status: Home / Self Care | Payer: Medicare Other | Source: Ambulatory Visit | Attending: Radiation Oncology | Admitting: Radiation Oncology

## 2018-02-10 DIAGNOSIS — Z510 Encounter for antineoplastic radiation therapy: Secondary | ICD-10-CM | POA: Diagnosis not present

## 2018-02-10 DIAGNOSIS — R300 Dysuria: Secondary | ICD-10-CM | POA: Diagnosis not present

## 2018-02-10 DIAGNOSIS — C672 Malignant neoplasm of lateral wall of bladder: Secondary | ICD-10-CM | POA: Diagnosis not present

## 2018-02-13 ENCOUNTER — Ambulatory Visit
Admission: RE | Admit: 2018-02-13 | Discharge: 2018-02-13 | Disposition: A | Discharge status: Home / Self Care | Payer: Medicare Other | Source: Ambulatory Visit | Attending: Radiation Oncology | Admitting: Radiation Oncology

## 2018-02-13 DIAGNOSIS — Z510 Encounter for antineoplastic radiation therapy: Secondary | ICD-10-CM | POA: Diagnosis not present

## 2018-02-13 DIAGNOSIS — C672 Malignant neoplasm of lateral wall of bladder: Secondary | ICD-10-CM | POA: Insufficient documentation

## 2018-02-13 DIAGNOSIS — R300 Dysuria: Secondary | ICD-10-CM

## 2018-02-13 LAB — URINALYSIS, COMPLETE (UACMP) WITH MICROSCOPIC
Bilirubin Urine: NEGATIVE
Glucose, UA: NEGATIVE mg/dL
Ketones, ur: NEGATIVE mg/dL
Nitrite: POSITIVE — AB
Protein, ur: 100 mg/dL — AB
Specific Gravity, Urine: 1.012 (ref 1.005–1.030)
pH: 6 (ref 5.0–8.0)

## 2018-02-14 ENCOUNTER — Ambulatory Visit
Admission: RE | Admit: 2018-02-14 | Discharge: 2018-02-14 | Disposition: A | Discharge status: Home / Self Care | Payer: Medicare Other | Source: Ambulatory Visit | Attending: Radiation Oncology | Admitting: Radiation Oncology

## 2018-02-14 DIAGNOSIS — Z510 Encounter for antineoplastic radiation therapy: Secondary | ICD-10-CM | POA: Diagnosis not present

## 2018-02-14 DIAGNOSIS — C672 Malignant neoplasm of lateral wall of bladder: Secondary | ICD-10-CM | POA: Diagnosis not present

## 2018-02-14 DIAGNOSIS — R300 Dysuria: Secondary | ICD-10-CM | POA: Diagnosis not present

## 2018-02-14 LAB — URINE CULTURE: Culture: <10000 — AB

## 2018-02-15 ENCOUNTER — Encounter: Payer: Self-pay | Admitting: Radiation Oncology

## 2018-02-15 DIAGNOSIS — J449 Chronic obstructive pulmonary disease, unspecified: Secondary | ICD-10-CM | POA: Diagnosis not present

## 2018-02-15 DIAGNOSIS — N179 Acute kidney failure, unspecified: Secondary | ICD-10-CM | POA: Diagnosis not present

## 2018-02-15 DIAGNOSIS — R0602 Shortness of breath: Secondary | ICD-10-CM | POA: Diagnosis not present

## 2018-02-15 NOTE — Progress Notes (Signed)
  Radiation Oncology         [phone]) [phone] ________________________________  Name: Jose Burly Livermore Sr. MRN: [id number]  Date: 02/15/2018  DOB: 1925-02-27  End of Treatment Note  Diagnosis:   Malignant neoplasm of lateral wall of bladder     Indication for treatment:  Curative       Radiation treatment dates:   12/21/17-02/14/18  Site/dose:   1.pelvis/ Bladder; 45 Gy in 25 fractions of 1.8 Gy          2. Bladder Boost; 19.8 Gy in 11 fractions of 1.8 Gy (64.8 Gy)  Beams/energy:   IMRT Photon; 6X  Narrative:  At the beginning of treatment, pt reported burning and penile pain. He had some constipation (relieved by stool softener) initially. Pt denied hematuria and n/v throughout treatments. Towards the end of treatment, pt reported nocturia x6-8 and penile pain. He was still quite symptomatic upon completion. Urinalysis was ordered to check for infection. He required antibiotic therapy on two occasions during course of treatment. Pyridium was somewhat helpful for his dysuria.   Plan: The patient has completed radiation treatment. The patient will return to radiation oncology clinic for routine followup in one month. I advised them to call or return sooner if they have any questions or concerns related to their recovery or treatment.  -----------------------------------  Blair Promise, PhD, MD  This document serves as a record of services personally performed by Jose Pray, MD. It was created on his behalf by Mary-Margaret Loma Messing, a trained medical scribe. The creation of this record is based on the scribe's personal observations and the provider's statements to them. This document has been checked and approved by the attending provider.

## 2018-02-23 ENCOUNTER — Telehealth: Payer: Self-pay | Admitting: Radiation Oncology

## 2018-02-23 DIAGNOSIS — C672 Malignant neoplasm of lateral wall of bladder: Secondary | ICD-10-CM | POA: Diagnosis not present

## 2018-02-23 DIAGNOSIS — R300 Dysuria: Secondary | ICD-10-CM | POA: Diagnosis not present

## 2018-02-23 DIAGNOSIS — R350 Frequency of micturition: Secondary | ICD-10-CM | POA: Diagnosis not present

## 2018-02-23 NOTE — Telephone Encounter (Signed)
Received voicemail message from wife, [name], requesting a return call. Phoned back to inquire. Gwen reports her husband continues to have burning with urination. Questioned wasn't the burning present following surgery and before radiation even started. She confirmed it was. Instructed her to contact Dr. Junious Silk office to arrange follow up. Stressed that Dr. Junious Silk is most equipped to determine the cause of his dysuria. Provided her with Dr. Lyndal Rainbow office number. Instructed her to phone back for an appointment with Dr. Sondra Come if they were unable to get in to see Dr. Junious Silk promptly. She verbalized understanding and expressed appreciation for the return call.

## 2018-02-27 ENCOUNTER — Other Ambulatory Visit: Payer: Self-pay

## 2018-02-27 ENCOUNTER — Ambulatory Visit
Admission: RE | Admit: 2018-02-27 | Discharge: 2018-02-27 | Disposition: A | Discharge status: Home / Self Care | Payer: Medicare Other | Source: Ambulatory Visit | Attending: Radiation Oncology | Admitting: Radiation Oncology

## 2018-02-27 ENCOUNTER — Ambulatory Visit (HOSPITAL_COMMUNITY)
Admission: RE | Admit: 2018-02-27 | Discharge: 2018-02-27 | Disposition: A | Discharge status: Home / Self Care | Payer: Medicare Other | Source: Ambulatory Visit | Attending: Oncology | Admitting: Oncology

## 2018-02-27 ENCOUNTER — Encounter: Payer: Self-pay | Admitting: Radiation Oncology

## 2018-02-27 ENCOUNTER — Inpatient Hospital Stay: Payer: Medicare Other | Attending: Oncology

## 2018-02-27 VITALS — BP 147/71 | HR 78 | Temp 97.7°F | Resp 20 | Ht 76.0 in | Wt 188.8 lb

## 2018-02-27 DIAGNOSIS — Z9221 Personal history of antineoplastic chemotherapy: Secondary | ICD-10-CM | POA: Diagnosis not present

## 2018-02-27 DIAGNOSIS — R300 Dysuria: Secondary | ICD-10-CM | POA: Diagnosis not present

## 2018-02-27 DIAGNOSIS — C679 Malignant neoplasm of bladder, unspecified: Secondary | ICD-10-CM

## 2018-02-27 DIAGNOSIS — C672 Malignant neoplasm of lateral wall of bladder: Secondary | ICD-10-CM | POA: Diagnosis not present

## 2018-02-27 DIAGNOSIS — Z923 Personal history of irradiation: Secondary | ICD-10-CM | POA: Insufficient documentation

## 2018-02-27 DIAGNOSIS — Z510 Encounter for antineoplastic radiation therapy: Secondary | ICD-10-CM | POA: Diagnosis not present

## 2018-02-27 DIAGNOSIS — Z79899 Other long term (current) drug therapy: Secondary | ICD-10-CM | POA: Diagnosis not present

## 2018-02-27 DIAGNOSIS — R918 Other nonspecific abnormal finding of lung field: Secondary | ICD-10-CM | POA: Diagnosis not present

## 2018-02-27 LAB — CBC WITH DIFFERENTIAL (CANCER CENTER ONLY)
Abs Immature Granulocytes: 0.02 10*3/uL (ref 0.00–0.07)
BASOS PCT: 1 %
Basophils Absolute: 0.1 10*3/uL (ref 0.0–0.1)
Eosinophils Absolute: 0.1 10*3/uL (ref 0.0–0.5)
Eosinophils Relative: 1 %
HCT: 44.3 % (ref 39.0–52.0)
Hemoglobin: 13.8 g/dL (ref 13.0–17.0)
Immature Granulocytes: 0 %
Lymphocytes Relative: 6 %
Lymphs Abs: 0.4 10*3/uL — ABNORMAL LOW (ref 0.7–4.0)
MCH: 30.7 pg (ref 26.0–34.0)
MCHC: 31.2 g/dL (ref 30.0–36.0)
MCV: 98.4 fL (ref 80.0–100.0)
Monocytes Absolute: 0.8 10*3/uL (ref 0.1–1.0)
Monocytes Relative: 14 %
NEUTROS ABS: 4.6 10*3/uL (ref 1.7–7.7)
Neutrophils Relative %: 78 %
Platelet Count: 238 10*3/uL (ref 150–400)
RBC: 4.5 MIL/uL (ref 4.22–5.81)
RDW: 17.1 % — ABNORMAL HIGH (ref 11.5–15.5)
WBC Count: 5.9 10*3/uL (ref 4.0–10.5)
nRBC: 0 % (ref 0.0–0.2)

## 2018-02-27 LAB — CMP (CANCER CENTER ONLY)
ALT: 16 U/L (ref 0–44)
AST: 16 U/L (ref 15–41)
Albumin: 4 g/dL (ref 3.5–5.0)
Alkaline Phosphatase: 77 U/L (ref 38–126)
Anion gap: 9 (ref 5–15)
BUN: 25 mg/dL — AB (ref 8–23)
CO2: 32 mmol/L (ref 22–32)
Calcium: 10.1 mg/dL (ref 8.9–10.3)
Chloride: 99 mmol/L (ref 98–111)
Creatinine: 1.19 mg/dL (ref 0.61–1.24)
GFR, Est AFR Am: >60 mL/min (ref >60)
GFR, Estimated: 53 mL/min — ABNORMAL LOW (ref >60)
Glucose, Bld: 86 mg/dL (ref 70–99)
Potassium: 5 mmol/L (ref 3.5–5.1)
Sodium: 140 mmol/L (ref 135–145)
TOTAL PROTEIN: 6.9 g/dL (ref 6.5–8.1)
Total Bilirubin: 0.9 mg/dL (ref 0.3–1.2)

## 2018-02-27 MED ORDER — SODIUM CHLORIDE 0.9 % IV SOLN
INTRAVENOUS | Status: AC
  Filled 2018-02-27: qty 250

## 2018-02-27 MED ORDER — SODIUM CHLORIDE (PF) 0.9 % IJ SOLN
INTRAMUSCULAR | Status: AC
  Filled 2018-02-27: qty 50

## 2018-02-27 MED ORDER — IOHEXOL 300 MG/ML  SOLN
100.0000 mL | Freq: Once | INTRAMUSCULAR | Status: AC | PRN
  Administered 2018-02-27: 100 mL via INTRAVENOUS

## 2018-02-27 NOTE — Progress Notes (Signed)
Pt presents today for work in appt. Pt reports frustration with urinary frequency of every 1 to 1 1/2 hours. Pt denies dysuria/hematuria. Pt denies diarrhea/constipation. Pt saw urologist Dr. Junious Silk PA on Friday and will see physician on Thursday. PA started Oxybutynin. Pt and wife both state Myrbetriq costly and are asking for generic. Encouraged pt and wife to speak with pharmacist. Urologist PA stated UA was clear and culture was minute and did not require antibiotic.  BP (!) 147/71 (BP Location: Left Arm, Patient Position: Sitting)   Pulse 78   Temp 97.7 F (36.5 C) (Oral)   Resp 20   Ht 6\' 4"  (1.93 m)   Wt 188 lb 12.8 oz (85.6 kg)   SpO2 97%   BMI 22.98 kg/m   Wt Readings from Last 3 Encounters:  02/27/18 188 lb 12.8 oz (85.6 kg)  01/23/18 189 lb 11.2 oz (86 kg)  01/16/18 191 lb (86.6 kg)   Loma Sousa, RN BSN

## 2018-02-27 NOTE — Progress Notes (Signed)
Radiation Oncology         [phone]) [phone] ________________________________  Name: Jose Burly Kleiman Sr. MRN: [id number]  Date: 02/27/2018  DOB: 20-Jan-1925  Follow-Up Visit Note  CC: [name], MD  Wyatt Portela, MD    ICD-10-CM   1. Cancer of lateral wall of urinary bladder (HCC) C67.2     Diagnosis:   Malignant neoplasm of lateral wall of bladder     Interval Since Last Radiation:  2 weeks  Radiation treatment dates:   12/21/17-02/14/18  Site/dose:   1.pelvis/ Bladder; 45 Gy in 25 fractions of 1.8 Gy                     2. Bladder Boost; 19.8 Gy in 11 fractions of 1.8 Gy (64.8 Gy)  Narrative:  The patient returns today for close follow-up.  He is accompanied by his wife.               On review of systems, he reports severe urinary frequency (every hour) and nocturia. he denies burning with urination, hematuria, rectal bleeding and any other symptoms. .  Dysuria has finally subsided but frequency is still an issue. He was seen by urology late last week and started on oxybutynin. Urine culture last week did not show any significant infection.                  ALLERGIES:  is allergic to doxycycline.  Meds: Current Outpatient Medications  Medication Sig Dispense Refill  . atorvastatin (LIPITOR) 20 MG tablet Take 20 mg by mouth daily after breakfast.     . fosfomycin (MONUROL) 3 g PACK Take 3 g by mouth every other day. Take one packet by mouth every other day for 3 doses.    . iron polysaccharides (NIFEREX) 150 MG capsule Take 150 mg by mouth daily.    [name] levothyroxine (SYNTHROID, LEVOTHROID) 175 MCG tablet Take 175 mcg by mouth daily after breakfast.     . nitroGLYCERIN (NITRODUR - DOSED IN MG/24 HR) 0.1 mg/hr patch Place 0.1 mg onto the skin daily.    . nitroGLYCERIN (NITROSTAT) 0.4 MG SL tablet Place 1 tablet (0.4 mg total) under the tongue every 5 (five) minutes as needed for chest pain. 30 tablet 12  . oxybutynin (DITROPAN) 5 MG tablet Take 5 mg by mouth every 8  (eight) hours as needed.    . pantoprazole (PROTONIX) 40 MG tablet Take 1 tablet (40 mg total) by mouth daily. (Patient taking differently: Take 40 mg by mouth daily after breakfast. ) 60 tablet 0  . phenazopyridine (PYRIDIUM) 200 MG tablet Take 200 mg by mouth 3 (three) times daily as needed.    . polyethylene glycol powder (GLYCOLAX/MIRALAX) powder Take 17 g by mouth daily as needed (for constipation).    . prochlorperazine (COMPAZINE) 10 MG tablet Take 1 tablet (10 mg total) by mouth every 6 (six) hours as needed for nausea or vomiting. 30 tablet 0  . sodium chloride (MURO 128) 5 % ophthalmic solution Place 1 drop into both eyes 3 (three) times daily as needed for eye irritation.    [name] testosterone enanthate (DELATESTRYL) 200 MG/ML injection Inject 200 mg into the muscle every 28 (twenty-eight) days. For IM use only    . triamterene-hydrochlorothiazide (MAXZIDE-25) 37.5-25 MG tablet Take 1 tablet by mouth daily.  0  . vitamin B-12 (CYANOCOBALAMIN) 1000 MCG tablet Take 1,000 mcg by mouth 3 (three) times a week.    . Vitamin D, Ergocalciferol, (  DRISDOL) 50000 units CAPS capsule Take 50,000 Units by mouth every Monday.     . mirabegron ER (MYRBETRIQ) 25 MG TB24 tablet Take 1 tablet (25 mg total) by mouth daily. (Patient not taking: Reported on 02/27/2018) 30 tablet 1  . phenazopyridine (PYRIDIUM) 100 MG tablet Take 1 tablet (100 mg total) by mouth 3 (three) times daily as needed for pain. (Patient not taking: Reported on 02/27/2018) 30 tablet 1   No current facility-administered medications for this encounter.    Facility-Administered Medications Ordered in Other Encounters  Medication Dose Route Frequency Provider Last Rate Last Dose  . sodium chloride (PF) 0.9 % injection           . sodium chloride 0.9 % infusion             Physical Findings: The patient is in no acute distress. Patient is alert and oriented.  height is 6\' 4"  (1.93 m) and weight is 188 lb 12.8 oz (85.6 kg). His oral  temperature is 97.7 F (36.5 C). His blood pressure is 147/71 (abnormal) and his pulse is 78. His respiration is 20 and oxygen saturation is 97%. .  No significant changes. Lungs are clear to auscultation bilaterally. Heart has regular rate and rhythm. No palpable cervical, supraclavicular, or axillary adenopathy. Abdomen soft, non-tender, normal bowel sounds.   Lab Findings: Lab Results  Component Value Date   WBC 5.9 02/27/2018   HGB 13.8 02/27/2018   HCT 44.3 02/27/2018   MCV 98.4 02/27/2018   PLT 238 02/27/2018     Impression: Malignant neoplasm of lateral wall of bladder. Pt continues to be quite symptomatic with his urinary frequency during the daytime and at night. On a positive note, his dysuria has improved over the past several days. Pt was recently started on Oxybutynin, so hopefully this will help with symptomology.    Plan:  He will proceed with Ct scan of chest abdomen and pelvis later today. Follow-up with Dr. Alen Blew later this week. He will also meet with Dr. Junious Silk later this month. Follow-up in radiation oncology in March.   ____________________________________   Blair Promise, PhD, MD    This document serves as a record of services personally performed by Gery Pray, MD. It was created on his behalf by Mary-Margaret Loma Messing, a trained medical scribe. The creation of this record is based on the scribe's personal observations and the provider's statements to them. This document has been checked and approved by the attending provider.

## 2018-03-01 ENCOUNTER — Telehealth: Payer: Self-pay | Admitting: Oncology

## 2018-03-01 ENCOUNTER — Inpatient Hospital Stay (HOSPITAL_BASED_OUTPATIENT_CLINIC_OR_DEPARTMENT_OTHER): Payer: Medicare Other | Admitting: Oncology

## 2018-03-01 ENCOUNTER — Other Ambulatory Visit (HOSPITAL_COMMUNITY)
Admission: RE | Admit: 2018-03-01 | Discharge: 2018-03-01 | Disposition: A | Discharge status: Home / Self Care | Payer: Medicare Other | Source: Ambulatory Visit | Attending: Oncology | Admitting: Oncology

## 2018-03-01 ENCOUNTER — Other Ambulatory Visit: Payer: Self-pay | Admitting: Oncology

## 2018-03-01 VITALS — BP 148/72 | HR 80 | Temp 97.7°F | Resp 18 | Ht 76.0 in | Wt 188.8 lb

## 2018-03-01 DIAGNOSIS — C672 Malignant neoplasm of lateral wall of bladder: Secondary | ICD-10-CM | POA: Diagnosis not present

## 2018-03-01 DIAGNOSIS — E291 Testicular hypofunction: Secondary | ICD-10-CM | POA: Diagnosis not present

## 2018-03-01 DIAGNOSIS — Z9221 Personal history of antineoplastic chemotherapy: Secondary | ICD-10-CM | POA: Diagnosis not present

## 2018-03-01 DIAGNOSIS — Z923 Personal history of irradiation: Secondary | ICD-10-CM

## 2018-03-01 DIAGNOSIS — C679 Malignant neoplasm of bladder, unspecified: Secondary | ICD-10-CM | POA: Diagnosis not present

## 2018-03-01 DIAGNOSIS — R918 Other nonspecific abnormal finding of lung field: Secondary | ICD-10-CM | POA: Diagnosis not present

## 2018-03-01 DIAGNOSIS — Z79899 Other long term (current) drug therapy: Secondary | ICD-10-CM

## 2018-03-01 NOTE — Telephone Encounter (Signed)
Scheduled appt per 02/19 los. ° °Printed calendar and avs. °

## 2018-03-01 NOTE — Progress Notes (Signed)
Hematology and Oncology Follow Up Visit  Jose Lawson [id number] July 10, 1925 85 y.o. 03/01/2018 12:56 PM [name], MDPaterson, Jose Quince, MD   Principle Diagnosis: 85 year-old man with bladder cancer diagnosed in 2019.  He was found to have T2N0 disease.  Prior Therapy:  He is status post multiple cystoscopy and fulguration in the past.  He also received BCG 2014 and was discontinued because of fevers. He is status post epirubicin in May 2019 for high-grade T1 disease. TURBT completed on 11/29/2017 in the care of Dr. Junious Silk.  Final pathology showed a muscle invasive tumor. Radiation and weekly carboplatin chemotherapy started on 12/19/2017.  Therapy completed in January 2020.  Current therapy: Active surveillance.  Interim History: Mr. Jose Lawson is here for a repeat evaluation.  Since the last visit, he completed therapy with radiation and chemotherapy as a definitive modality to treat his bladder cancer.  Since the last visit, he has reported few complaints.  Predominantly issues of dysuria but no hematuria or abdominal pain.  Denies any flank pain or discomfort.  He denies any respiratory issues including cough or wheezing.  He remains ambulatory and has gained more weight upon the conclusion of his therapy.  He denies any residual toxicities related to chemotherapy.  Patient denied any alteration mental status, neuropathy, confusion or dizziness.  Denies any headaches or lethargy.  Denies any night sweats, weight loss or changes in appetite.  Denied orthopnea, dyspnea on exertion or chest discomfort.  Denies shortness of breath, difficulty breathing hemoptysis or cough.  Denies any abdominal distention, nausea, early satiety or dyspepsia.  Denies any hematuria, frequency, dysuria or nocturia.  Denies any skin irritation, dryness or rash.  Denies any ecchymosis or petechiae.  Denies any lymphadenopathy or clotting.  Denies any heat or cold intolerance.  Denies any anxiety or  depression.  Remaining review of system is negative.  .    Medications: I have reviewed the patient's current medications.  Current Outpatient Medications  Medication Sig Dispense Refill  . atorvastatin (LIPITOR) 20 MG tablet Take 20 mg by mouth daily after breakfast.     . fosfomycin (MONUROL) 3 g PACK Take 3 g by mouth every other day. Take one packet by mouth every other day for 3 doses.    . iron polysaccharides (NIFEREX) 150 MG capsule Take 150 mg by mouth daily.    [name] levothyroxine (SYNTHROID, LEVOTHROID) 175 MCG tablet Take 175 mcg by mouth daily after breakfast.     . mirabegron ER (MYRBETRIQ) 25 MG TB24 tablet Take 1 tablet (25 mg total) by mouth daily. (Patient not taking: Reported on 02/27/2018) 30 tablet 1  . nitroGLYCERIN (NITRODUR - DOSED IN MG/24 HR) 0.1 mg/hr patch Place 0.1 mg onto the skin daily.    . nitroGLYCERIN (NITROSTAT) 0.4 MG SL tablet Place 1 tablet (0.4 mg total) under the tongue every 5 (five) minutes as needed for chest pain. 30 tablet 12  . oxybutynin (DITROPAN) 5 MG tablet Take 5 mg by mouth every 8 (eight) hours as needed.    . pantoprazole (PROTONIX) 40 MG tablet Take 1 tablet (40 mg total) by mouth daily. (Patient taking differently: Take 40 mg by mouth daily after breakfast. ) 60 tablet 0  . phenazopyridine (PYRIDIUM) 100 MG tablet Take 1 tablet (100 mg total) by mouth 3 (three) times daily as needed for pain. (Patient not taking: Reported on 02/27/2018) 30 tablet 1  . phenazopyridine (PYRIDIUM) 200 MG tablet Take 200 mg by mouth 3 (three) times daily as  needed.    . polyethylene glycol powder (GLYCOLAX/MIRALAX) powder Take 17 g by mouth daily as needed (for constipation).    . prochlorperazine (COMPAZINE) 10 MG tablet Take 1 tablet (10 mg total) by mouth every 6 (six) hours as needed for nausea or vomiting. 30 tablet 0  . sodium chloride (MURO 128) 5 % ophthalmic solution Place 1 drop into both eyes 3 (three) times daily as needed for eye irritation.    [name]  testosterone enanthate (DELATESTRYL) 200 MG/ML injection Inject 200 mg into the muscle every 28 (twenty-eight) days. For IM use only    . triamterene-hydrochlorothiazide (MAXZIDE-25) 37.5-25 MG tablet Take 1 tablet by mouth daily.  0  . vitamin B-12 (CYANOCOBALAMIN) 1000 MCG tablet Take 1,000 mcg by mouth 3 (three) times a week.    . Vitamin D, Ergocalciferol, (DRISDOL) 50000 units CAPS capsule Take 50,000 Units by mouth every Monday.      No current facility-administered medications for this visit.      Allergies:  Allergies  Allergen Reactions  . Doxycycline     dizziness    Past Medical History, Surgical history, Social history, and Family History were reviewed and updated.    Physical Exam: Blood pressure (!) 148/72, pulse 80, temperature 97.7 F (36.5 C), temperature source Oral, resp. rate 18, height 6\' 4"  (1.93 m), weight 188 lb 12.8 oz (85.6 kg), SpO2 95 %.     ECOG: 1   General appearance: Alert, awake without any distress. Head: Atraumatic without abnormalities Oropharynx: Without any thrush or ulcers. Eyes: No scleral icterus. Lymph nodes: No lymphadenopathy noted in the cervical, supraclavicular, or axillary nodes Heart:regular rate and rhythm, without any murmurs or gallops.   Lung: Clear to auscultation without any rhonchi, wheezes or dullness to percussion. Abdomin: Soft, nontender without any shifting dullness or ascites. Musculoskeletal: No clubbing or cyanosis. Neurological: No motor or sensory deficits. Skin: No rashes or lesions.        Lab Results: Lab Results  Component Value Date   WBC 5.9 02/27/2018   HGB 13.8 02/27/2018   HCT 44.3 02/27/2018   MCV 98.4 02/27/2018   PLT 238 02/27/2018     Chemistry      Component Value Date/Time   NA 140 02/27/2018 0946   K 5.0 02/27/2018 0946   CL 99 02/27/2018 0946   CO2 32 02/27/2018 0946   BUN 25 (H) 02/27/2018 0946   CREATININE 1.19 02/27/2018 0946      Component Value Date/Time    CALCIUM 10.1 02/27/2018 0946   ALKPHOS 77 02/27/2018 0946   AST 16 02/27/2018 0946   ALT 16 02/27/2018 0946   BILITOT 0.9 02/27/2018 0946     EXAM: CT CHEST, ABDOMEN, AND PELVIS WITH CONTRAST  TECHNIQUE: Multidetector CT imaging of the chest, abdomen and pelvis was performed following the standard protocol during bolus administration of intravenous contrast.  CONTRAST:  127mL OMNIPAQUE IOHEXOL 300 MG/ML  SOLN  COMPARISON:  CT AP 11/09/2017.  CT chest 06/01/17  FINDINGS: CT CHEST FINDINGS  Cardiovascular: The heart size appears within normal limits. No pericardial effusion. Aortic atherosclerosis. Calcification in the left main, lad and RCA coronary arteries noted.  Mediastinum/Nodes: Postoperative changes from previous thyroidectomy. The trachea appears patent and is midline. Normal appearance of the esophagus. No enlarged mediastinal or hilar lymph nodes. No axillary or supraclavicular adenopathy.  Lungs/Pleura: No pleural effusion. Mild changes of emphysema identified. Pulmonary nodules are identified bilaterally:  -index nodule within the anterior right upper lobe measures 1.5 by  1.4 cm, image 32/5. New from previous exam.  -index nodule in the left upper lobe measures 1.0 by 0.7 cm, image 81/5. New from previous exam.  -index nodule within the superior segment of left lower lobe measures 1.5 by 1.3 cm, image 68/5. New from previous exam.  -index right lower lobe lung nodule measures 0.4 cm, image 89/5. New from previous exam.  -index subpleural nodule within the posterior aspect of superior segment of right lower lobe measures 0.9 by 0.7 cm, image 57/5. New from previous exam.  Musculoskeletal: No chest wall mass or suspicious bone lesions identified.  CT ABDOMEN PELVIS FINDINGS  Hepatobiliary: Enhancing lesion within segment 7 is again noted measuring 1.5 cm, image 26/2. Unchanged from previous exam. Previously characterized hemangioma  within segment 2 is unchanged measuring 1.6 cm, image 151/2. No suspicious liver lesions. Gallbladder normal. No biliary dilatation.  Pancreas: Unremarkable. No pancreatic ductal dilatation or surrounding inflammatory changes.  Spleen: Normal in size without focal abnormality.  Adrenals/Urinary Tract: Normal appearance of the adrenal glands. Bilateral kidney cysts are again noted. No mass or hydronephrosis identified bilaterally. No enhancing filling defects identified within the collecting systems bilaterally.  Bladder wall trabeculation with diverticula formation is identified suggestive of chronic bladder outlet obstruction. Again seen is abnormal wall thickening along the right lateral and posterior wall of the urinary bladder as well as the dome. Within this area there is a progressive focal region of increased enhancement measuring 3.6 x 1.3 cm. Bladder wall tumor involves the right UVJ and there is new soft tissue thickening involving the wall of the distal right ureter just before the UVJ with progressive right hydroureter, image 57/16.  Stomach/Bowel: Stomach appears normal no abnormal small bowel dilatation. Moderate stool burden noted throughout the colon.  Vascular/Lymphatic: Aortic atherosclerosis. No aneurysm. No abdominal adenopathy. No pelvic or inguinal adenopathy.  Reproductive: Dystrophic calcifications noted within the prostate gland.  Other: No free fluid or fluid collections.  Musculoskeletal: No aggressive lytic or sclerotic bone lesions.  IMPRESSION: 1. Interval development of multifocal bilateral pulmonary nodules worrisome for metastatic disease. 2. Again seen is enhancing right bladder wall lesion compatible with recurrent urothelial neoplasm. There is progressive right hydroureter and soft tissue thickening of the distal right ureter concerning for local tumor progression. 3. Stable, presumed benign kidney lesions and liver  lesions.      Impression and Plan:  85 year-old man with:  1.  Bladder cancer presented with a T2N0 muscle invasive disease and completed definitive therapy with radiation with weekly chemotherapy.  The natural course of this disease as well as the results of the CT scan were reviewed today.  CT scan showed interval development of right bladder wall lesion compatible with recurrent neoplasm.  He also has bilateral pulmonary nodules which could represent metastatic disease.  Management options were reviewed today and I have recommended proceeding with a cystoscopy to evaluate his bladder lesion.  I have also recommended repeat imaging studies in 2 to 3 months of the chest to follow-up on his pulmonary nodules.  If indeed we are dealing with disease that has metastasized to the lung systemic therapy will be his next option.  I will check his PDL 1 status given the fact that he is a marginal candidate for any additional platinum therapy.  If he is PDL 1 status is overexpressed, immunotherapy would be his best option moving forward.  He would be a marginal candidate for treatment with carboplatin and gemcitabine.  In the meantime, he has follow-up  with Dr. Junious Silk for a repeat cystoscopy tentatively in the future.  2.  IV access: Peripheral veins will be used in the future for any systemic therapy.  3. Follow-up: We will in May 2020 sooner if needed to.  25  minutes was spent with the patient face-to-face today.  More than 50% of time was spent on discussing imaging studies, treatment options and answering questions regarding future plan of care.     Zola Button, MD 2/19/202012:56 PM

## 2018-03-01 NOTE — Progress Notes (Signed)
DISCONTINUE OFF PATHWAY REGIMEN - Bladder   [id number]:Carboplatin AUC=2:   Administer weekly:     Carboplatin   **Always confirm dose/schedule in your pharmacy ordering system**  REASON: Disease Progression PRIOR TREATMENT: Off Pathway: Carboplatin AUC=2 TREATMENT RESPONSE: Progressive Disease (PD)  START ON PATHWAY REGIMEN - Bladder     A cycle is 21 days:     Pembrolizumab   **Always confirm dose/schedule in your pharmacy ordering system**  Patient Characteristics: Metastatic Disease, First Line, No Prior Platinum-Based Therapy, Poor Renal Function (CrCl < 50 mL/min), Unknown PD-L1 Expression Therapeutic Status: Metastatic Disease Line of Therapy: First Line Prior Platinum-Based Therapy<= No Renal Function: Poor Renal Function (CrCl < 50 mL/min) PD-L1 Expression Status: Unknown PD-L1 Expression Intent of Therapy: Non-Curative / Palliative Intent, Discussed with Patient

## 2018-03-02 DIAGNOSIS — R300 Dysuria: Secondary | ICD-10-CM | POA: Diagnosis not present

## 2018-03-02 DIAGNOSIS — C672 Malignant neoplasm of lateral wall of bladder: Secondary | ICD-10-CM | POA: Diagnosis not present

## 2018-03-08 DIAGNOSIS — D225 Melanocytic nevi of trunk: Secondary | ICD-10-CM | POA: Diagnosis not present

## 2018-03-08 DIAGNOSIS — L570 Actinic keratosis: Secondary | ICD-10-CM | POA: Diagnosis not present

## 2018-03-08 DIAGNOSIS — D485 Neoplasm of uncertain behavior of skin: Secondary | ICD-10-CM | POA: Diagnosis not present

## 2018-03-08 DIAGNOSIS — Z85828 Personal history of other malignant neoplasm of skin: Secondary | ICD-10-CM | POA: Diagnosis not present

## 2018-03-08 DIAGNOSIS — R300 Dysuria: Secondary | ICD-10-CM | POA: Diagnosis not present

## 2018-03-08 DIAGNOSIS — L821 Other seborrheic keratosis: Secondary | ICD-10-CM | POA: Diagnosis not present

## 2018-03-13 DIAGNOSIS — R82998 Other abnormal findings in urine: Secondary | ICD-10-CM | POA: Diagnosis not present

## 2018-03-13 DIAGNOSIS — I10 Essential (primary) hypertension: Secondary | ICD-10-CM | POA: Diagnosis not present

## 2018-03-13 DIAGNOSIS — E7849 Other hyperlipidemia: Secondary | ICD-10-CM | POA: Diagnosis not present

## 2018-03-13 DIAGNOSIS — E538 Deficiency of other specified B group vitamins: Secondary | ICD-10-CM | POA: Diagnosis not present

## 2018-03-13 DIAGNOSIS — E038 Other specified hypothyroidism: Secondary | ICD-10-CM | POA: Diagnosis not present

## 2018-03-20 ENCOUNTER — Other Ambulatory Visit: Payer: Self-pay | Admitting: Internal Medicine

## 2018-03-20 ENCOUNTER — Ambulatory Visit: Payer: Self-pay | Admitting: Radiation Oncology

## 2018-03-20 DIAGNOSIS — Z0000 Encounter for general adult medical examination without abnormal findings: Secondary | ICD-10-CM | POA: Diagnosis not present

## 2018-03-20 DIAGNOSIS — G4733 Obstructive sleep apnea (adult) (pediatric): Secondary | ICD-10-CM | POA: Diagnosis not present

## 2018-03-20 DIAGNOSIS — J449 Chronic obstructive pulmonary disease, unspecified: Secondary | ICD-10-CM | POA: Diagnosis not present

## 2018-03-20 DIAGNOSIS — I10 Essential (primary) hypertension: Secondary | ICD-10-CM | POA: Diagnosis not present

## 2018-03-23 ENCOUNTER — Other Ambulatory Visit: Payer: Self-pay

## 2018-03-23 ENCOUNTER — Ambulatory Visit
Admission: RE | Admit: 2018-03-23 | Discharge: 2018-03-23 | Disposition: A | Discharge status: Home / Self Care | Payer: Medicare Other | Source: Ambulatory Visit | Attending: Radiation Oncology | Admitting: Radiation Oncology

## 2018-03-23 ENCOUNTER — Encounter: Payer: Self-pay | Admitting: Radiation Oncology

## 2018-03-23 VITALS — BP 130/71 | HR 80 | Temp 98.0°F | Resp 18 | Ht 76.0 in | Wt 194.2 lb

## 2018-03-23 DIAGNOSIS — C672 Malignant neoplasm of lateral wall of bladder: Secondary | ICD-10-CM | POA: Diagnosis not present

## 2018-03-23 DIAGNOSIS — J439 Emphysema, unspecified: Secondary | ICD-10-CM | POA: Insufficient documentation

## 2018-03-23 DIAGNOSIS — Z79899 Other long term (current) drug therapy: Secondary | ICD-10-CM | POA: Diagnosis not present

## 2018-03-23 DIAGNOSIS — Z923 Personal history of irradiation: Secondary | ICD-10-CM | POA: Insufficient documentation

## 2018-03-23 DIAGNOSIS — R351 Nocturia: Secondary | ICD-10-CM | POA: Insufficient documentation

## 2018-03-23 DIAGNOSIS — N3289 Other specified disorders of bladder: Secondary | ICD-10-CM | POA: Diagnosis not present

## 2018-03-23 DIAGNOSIS — N281 Cyst of kidney, acquired: Secondary | ICD-10-CM | POA: Diagnosis not present

## 2018-03-23 DIAGNOSIS — M7989 Other specified soft tissue disorders: Secondary | ICD-10-CM | POA: Insufficient documentation

## 2018-03-23 DIAGNOSIS — I700 Atherosclerosis of aorta: Secondary | ICD-10-CM | POA: Insufficient documentation

## 2018-03-23 NOTE — Progress Notes (Signed)
Radiation Oncology         [phone]) [phone] ________________________________  Name: Jose Burly Bewick Sr. MRN: [id number]  Date: 03/23/2018  DOB: May 01, 1925  Follow-Up Visit Note  CC: [name], MD  Wyatt Portela, MD    ICD-10-CM   1. 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 Pelvis/Bladder // 45 Gy in 25 fractions of 1.8 Gy 2. Bladder Boost // 19.8 Gy in 11 fractions of 1.8 Gy (Total 64.8 Gy)  Narrative:  The patient returns today with his wife for routine follow-up.  He recently underwent restaging CT scan of the chest, abdomen, and pelvis on 02/27/2018. This showed an enhancing right bladder wall lesion, compatible with recurrent urothelial neoplasm. There was progressive right hydroureter and soft tissue thickening of the distal right ureter, concerning for local tumor progression. There was also interval development of multifocal bilateral pulmonary nodules, worrisome for metastatic disease. Stable, presumed benign kidney lesions and liver lesions also noted.   He is scheduled for repeat imaging on 05/16/2018.   He had a cystoscopy done with Dr. Junious Silk and per the patient's report, there was thickening but no obvious tumor. He will have a repeat cystoscopy on April 4th.  On review of systems, the patient states that his urinary symptoms are improving. The patient reports penile pain, rated 1/10. He reports urinary frequency every 2-3 hours during the day and nocturia 3-4 times per night. He reports rare to occasional burning with urination. He denies hematuria. He denies rectal bleeding, nausea, diarrhea, or constipation. He reports his energy level has improved from 4/10 to 6-7/10. He reports some swelling in his ankles which he attributes to standing for long periods of time. He is  back to woodworking.  ALLERGIES:  is allergic to doxycycline.  Meds: Current Outpatient Medications  Medication Sig Dispense Refill   atorvastatin (LIPITOR) 20 MG tablet Take 20 mg by mouth daily after breakfast.      iron polysaccharides (NIFEREX) 150 MG capsule Take 150 mg by mouth 3 (three) times a week.      levothyroxine (SYNTHROID, LEVOTHROID) 175 MCG tablet Take 175 mcg by mouth daily after breakfast.      mirabegron ER (MYRBETRIQ) 25 MG TB24 tablet Take 25 mg by mouth daily.     nitroGLYCERIN (NITRODUR - DOSED IN MG/24 HR) 0.1 mg/hr patch Place 0.1 mg onto the skin daily.     nitroGLYCERIN (NITROSTAT) 0.4 MG SL tablet Place 1 tablet (0.4 mg total) under the tongue every 5 (five) minutes as needed for chest pain. 30 tablet 12   pantoprazole (PROTONIX) 40 MG tablet Take 1 tablet (40 mg total) by mouth daily. (Patient taking differently: Take 40 mg by mouth daily after breakfast. ) 60 tablet 0   phenazopyridine (PYRIDIUM) 200 MG tablet Take 200 mg by mouth 3 (three) times daily as needed.     polyethylene glycol powder (GLYCOLAX/MIRALAX) powder Take 17 g by mouth daily as needed (for constipation).     sodium chloride (MURO 128) 5 % ophthalmic solution Place 1 drop into both eyes 3 (three) times daily as needed for eye irritation.     STIOLTO RESPIMAT 2.5-2.5 MCG/ACT AERS INHALE 2 PUFFS INTO LUNGS ONCE DAILY 4 g 5   testosterone enanthate (DELATESTRYL) 200 MG/ML injection Inject 200 mg  into the muscle every 28 (twenty-eight) days. For IM use only     triamterene-hydrochlorothiazide (MAXZIDE-25) 37.5-25 MG tablet Take 1 tablet by mouth daily.  0   vitamin B-12 (CYANOCOBALAMIN) 1000 MCG tablet Take 1,000 mcg by mouth 3 (three) times a week.     Vitamin D, Ergocalciferol, (DRISDOL) 50000 units CAPS capsule Take 50,000 Units by mouth every Monday.      phenazopyridine (PYRIDIUM) 100 MG tablet Take 1 tablet (100 mg total) by mouth 3 (three) times daily as needed for pain.  (Patient not taking: Reported on 02/27/2018) 30 tablet 1   No current facility-administered medications for this encounter.     Physical Findings: The patient is in no acute distress. Patient is alert and oriented.  height is 6\' 4"  (1.93 m) and weight is 194 lb 4 oz (88.1 kg). His oral temperature is 98 F (36.7 C). His blood pressure is 130/71 and his pulse is 80. His respiration is 18 and oxygen saturation is 94%.   Lungs are clear to auscultation bilaterally. Heart has regular rate and rhythm. No palpable cervical, supraclavicular, or axillary adenopathy. Abdomen soft, non-tender, normal bowel sounds.  Lab Findings: Lab Results  Component Value Date   WBC 5.9 02/27/2018   HGB 13.8 02/27/2018   HCT 44.3 02/27/2018   MCV 98.4 02/27/2018   PLT 238 02/27/2018    Radiographic Findings: Ct Chest W Contrast  Result Date: 02/27/2018 CLINICAL DATA:  Staging urothelial carcinoma. EXAM: CT CHEST, ABDOMEN, AND PELVIS WITH CONTRAST TECHNIQUE: Multidetector CT imaging of the chest, abdomen and pelvis was performed following the standard protocol during bolus administration of intravenous contrast. CONTRAST:  131mL OMNIPAQUE IOHEXOL 300 MG/ML  SOLN COMPARISON:  CT AP 11/09/2017.  CT chest 06/01/17 FINDINGS: CT CHEST FINDINGS Cardiovascular: The heart size appears within normal limits. No pericardial effusion. Aortic atherosclerosis. Calcification in the left main, lad and RCA coronary arteries noted. Mediastinum/Nodes: Postoperative changes from previous thyroidectomy. The trachea appears patent and is midline. Normal appearance of the esophagus. No enlarged mediastinal or hilar lymph nodes. No axillary or supraclavicular adenopathy. Lungs/Pleura: No pleural effusion. Mild changes of emphysema identified. Pulmonary nodules are identified bilaterally: -index nodule within the anterior right upper lobe measures 1.5 by 1.4 cm, image 32/5. New from previous exam. -index nodule in the left upper lobe measures  1.0 by 0.7 cm, image 81/5. New from previous exam. -index nodule within the superior segment of left lower lobe measures 1.5 by 1.3 cm, image 68/5. New from previous exam. -index right lower lobe lung nodule measures 0.4 cm, image 89/5. New from previous exam. -index subpleural nodule within the posterior aspect of superior segment of right lower lobe measures 0.9 by 0.7 cm, image 57/5. New from previous exam. Musculoskeletal: No chest wall mass or suspicious bone lesions identified. CT ABDOMEN PELVIS FINDINGS Hepatobiliary: Enhancing lesion within segment 7 is again noted measuring 1.5 cm, image 26/2. Unchanged from previous exam. Previously characterized hemangioma within segment 2 is unchanged measuring 1.6 cm, image 151/2. No suspicious liver lesions. Gallbladder normal. No biliary dilatation. Pancreas: Unremarkable. No pancreatic ductal dilatation or surrounding inflammatory changes. Spleen: Normal in size without focal abnormality. Adrenals/Urinary Tract: Normal appearance of the adrenal glands. Bilateral kidney cysts are again noted. No mass or hydronephrosis identified bilaterally. No enhancing filling defects identified within the collecting systems bilaterally. Bladder wall trabeculation with diverticula formation is identified suggestive of chronic bladder outlet obstruction. Again seen is abnormal wall thickening along the right lateral and posterior wall of the  urinary bladder as well as the dome. Within this area there is a progressive focal region of increased enhancement measuring 3.6 x 1.3 cm. Bladder wall tumor involves the right UVJ and there is new soft tissue thickening involving the wall of the distal right ureter just before the UVJ with progressive right hydroureter, image 57/16. Stomach/Bowel: Stomach appears normal no abnormal small bowel dilatation. Moderate stool burden noted throughout the colon. Vascular/Lymphatic: Aortic atherosclerosis. No aneurysm. No abdominal adenopathy. No pelvic  or inguinal adenopathy. Reproductive: Dystrophic calcifications noted within the prostate gland. Other: No free fluid or fluid collections. Musculoskeletal: No aggressive lytic or sclerotic bone lesions. IMPRESSION: 1. Interval development of multifocal bilateral pulmonary nodules worrisome for metastatic disease. 2. Again seen is enhancing right bladder wall lesion compatible with recurrent urothelial neoplasm. There is progressive right hydroureter and soft tissue thickening of the distal right ureter concerning for local tumor progression. 3. Stable, presumed benign kidney lesions and liver lesions. Electronically Signed   By: Kerby Moors M.D.   On: 02/27/2018 14:56   Ct Abdomen Pelvis W Contrast  Result Date: 02/27/2018 CLINICAL DATA:  Staging urothelial carcinoma. EXAM: CT CHEST, ABDOMEN, AND PELVIS WITH CONTRAST TECHNIQUE: Multidetector CT imaging of the chest, abdomen and pelvis was performed following the standard protocol during bolus administration of intravenous contrast. CONTRAST:  164mL OMNIPAQUE IOHEXOL 300 MG/ML  SOLN COMPARISON:  CT AP 11/09/2017.  CT chest 06/01/17 FINDINGS: CT CHEST FINDINGS Cardiovascular: The heart size appears within normal limits. No pericardial effusion. Aortic atherosclerosis. Calcification in the left main, lad and RCA coronary arteries noted. Mediastinum/Nodes: Postoperative changes from previous thyroidectomy. The trachea appears patent and is midline. Normal appearance of the esophagus. No enlarged mediastinal or hilar lymph nodes. No axillary or supraclavicular adenopathy. Lungs/Pleura: No pleural effusion. Mild changes of emphysema identified. Pulmonary nodules are identified bilaterally: -index nodule within the anterior right upper lobe measures 1.5 by 1.4 cm, image 32/5. New from previous exam. -index nodule in the left upper lobe measures 1.0 by 0.7 cm, image 81/5. New from previous exam. -index nodule within the superior segment of left lower lobe measures  1.5 by 1.3 cm, image 68/5. New from previous exam. -index right lower lobe lung nodule measures 0.4 cm, image 89/5. New from previous exam. -index subpleural nodule within the posterior aspect of superior segment of right lower lobe measures 0.9 by 0.7 cm, image 57/5. New from previous exam. Musculoskeletal: No chest wall mass or suspicious bone lesions identified. CT ABDOMEN PELVIS FINDINGS Hepatobiliary: Enhancing lesion within segment 7 is again noted measuring 1.5 cm, image 26/2. Unchanged from previous exam. Previously characterized hemangioma within segment 2 is unchanged measuring 1.6 cm, image 151/2. No suspicious liver lesions. Gallbladder normal. No biliary dilatation. Pancreas: Unremarkable. No pancreatic ductal dilatation or surrounding inflammatory changes. Spleen: Normal in size without focal abnormality. Adrenals/Urinary Tract: Normal appearance of the adrenal glands. Bilateral kidney cysts are again noted. No mass or hydronephrosis identified bilaterally. No enhancing filling defects identified within the collecting systems bilaterally. Bladder wall trabeculation with diverticula formation is identified suggestive of chronic bladder outlet obstruction. Again seen is abnormal wall thickening along the right lateral and posterior wall of the urinary bladder as well as the dome. Within this area there is a progressive focal region of increased enhancement measuring 3.6 x 1.3 cm. Bladder wall tumor involves the right UVJ and there is new soft tissue thickening involving the wall of the distal right ureter just before the UVJ with progressive right hydroureter, image 57/16.  Stomach/Bowel: Stomach appears normal no abnormal small bowel dilatation. Moderate stool burden noted throughout the colon. Vascular/Lymphatic: Aortic atherosclerosis. No aneurysm. No abdominal adenopathy. No pelvic or inguinal adenopathy. Reproductive: Dystrophic calcifications noted within the prostate gland. Other: No free fluid or  fluid collections. Musculoskeletal: No aggressive lytic or sclerotic bone lesions. IMPRESSION: 1. Interval development of multifocal bilateral pulmonary nodules worrisome for metastatic disease. 2. Again seen is enhancing right bladder wall lesion compatible with recurrent urothelial neoplasm. There is progressive right hydroureter and soft tissue thickening of the distal right ureter concerning for local tumor progression. 3. Stable, presumed benign kidney lesions and liver lesions. Electronically Signed   By: Kerby Moors M.D.   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 Clinically stable. Overall his urinary symptoms and energy have improved over the past month. He did have CT scans of the chest, abdomen, and pelvis, and there was concern for pulmonary metastasis. He will have repeat scans in approximately 2 months. Patient reports having cystoscopy since completion of his radiation therapy with good report and no obvious tumor present. We have requested his cystoscopy report.  Plan:  Follow up in May, the same day as Dr. Alen Blew after the patient completes CT scans.  ____________________________________  Blair Promise, PhD, MD  This document serves as a record of services personally performed by Gery Pray, MD. It was created on his behalf by Rae Lips, a trained medical scribe. The creation of this record is based on the scribe's personal observations and the provider's statements to them. This document has been checked and approved by the attending provider.

## 2018-03-23 NOTE — Progress Notes (Signed)
Pt presents today for f/u with Dr. Sondra Come. Pt is accompanied by wife. Pt reports penile pain, rated 1/10. Pt reports frequency every 2-3 hours during day and nocturia of 3-4 times per night. Pt reports rare to occasional burning with urination. Pt denies hematuria. Pt denies rectal bleeding, diarrhea/constipation.   BP 130/71 (BP Location: Left Arm, Patient Position: Sitting)   Pulse 80   Temp 98 F (36.7 C) (Oral)   Resp 18   Ht 6\' 4"  (1.93 m)   Wt 194 lb 4 oz (88.1 kg)   SpO2 94%   BMI 23.64 kg/m   Wt Readings from Last 3 Encounters:  03/23/18 194 lb 4 oz (88.1 kg)  03/01/18 188 lb 12.8 oz (85.6 kg)  02/27/18 188 lb 12.8 oz (85.6 kg)   Loma Sousa, RN BSN

## 2018-03-30 DIAGNOSIS — D485 Neoplasm of uncertain behavior of skin: Secondary | ICD-10-CM | POA: Diagnosis not present

## 2018-03-30 DIAGNOSIS — L570 Actinic keratosis: Secondary | ICD-10-CM | POA: Diagnosis not present

## 2018-03-30 DIAGNOSIS — C44622 Squamous cell carcinoma of skin of right upper limb, including shoulder: Secondary | ICD-10-CM | POA: Diagnosis not present

## 2018-03-30 DIAGNOSIS — Z85828 Personal history of other malignant neoplasm of skin: Secondary | ICD-10-CM | POA: Diagnosis not present

## 2018-03-30 DIAGNOSIS — L0889 Other specified local infections of the skin and subcutaneous tissue: Secondary | ICD-10-CM | POA: Diagnosis not present

## 2018-04-13 DIAGNOSIS — C672 Malignant neoplasm of lateral wall of bladder: Secondary | ICD-10-CM | POA: Diagnosis not present

## 2018-04-13 DIAGNOSIS — Z85828 Personal history of other malignant neoplasm of skin: Secondary | ICD-10-CM | POA: Diagnosis not present

## 2018-04-13 DIAGNOSIS — C44622 Squamous cell carcinoma of skin of right upper limb, including shoulder: Secondary | ICD-10-CM | POA: Diagnosis not present

## 2018-04-13 DIAGNOSIS — R300 Dysuria: Secondary | ICD-10-CM | POA: Diagnosis not present

## 2018-04-25 DIAGNOSIS — M25561 Pain in right knee: Secondary | ICD-10-CM | POA: Diagnosis not present

## 2018-04-25 DIAGNOSIS — C679 Malignant neoplasm of bladder, unspecified: Secondary | ICD-10-CM | POA: Diagnosis not present

## 2018-04-25 DIAGNOSIS — R509 Fever, unspecified: Secondary | ICD-10-CM | POA: Diagnosis not present

## 2018-04-25 DIAGNOSIS — M25562 Pain in left knee: Secondary | ICD-10-CM | POA: Diagnosis not present

## 2018-04-27 DIAGNOSIS — I10 Essential (primary) hypertension: Secondary | ICD-10-CM | POA: Diagnosis not present

## 2018-04-27 DIAGNOSIS — D509 Iron deficiency anemia, unspecified: Secondary | ICD-10-CM | POA: Diagnosis not present

## 2018-04-27 DIAGNOSIS — E291 Testicular hypofunction: Secondary | ICD-10-CM | POA: Diagnosis not present

## 2018-05-04 DIAGNOSIS — M1712 Unilateral primary osteoarthritis, left knee: Secondary | ICD-10-CM | POA: Diagnosis not present

## 2018-05-04 DIAGNOSIS — M542 Cervicalgia: Secondary | ICD-10-CM | POA: Diagnosis not present

## 2018-05-04 DIAGNOSIS — M170 Bilateral primary osteoarthritis of knee: Secondary | ICD-10-CM | POA: Diagnosis not present

## 2018-05-04 DIAGNOSIS — M1711 Unilateral primary osteoarthritis, right knee: Secondary | ICD-10-CM | POA: Diagnosis not present

## 2018-05-08 DIAGNOSIS — M25562 Pain in left knee: Secondary | ICD-10-CM | POA: Diagnosis not present

## 2018-05-08 DIAGNOSIS — J449 Chronic obstructive pulmonary disease, unspecified: Secondary | ICD-10-CM | POA: Diagnosis not present

## 2018-05-08 DIAGNOSIS — G4733 Obstructive sleep apnea (adult) (pediatric): Secondary | ICD-10-CM | POA: Diagnosis not present

## 2018-05-08 DIAGNOSIS — R509 Fever, unspecified: Secondary | ICD-10-CM | POA: Diagnosis not present

## 2018-05-10 ENCOUNTER — Telehealth: Payer: Self-pay | Admitting: Oncology

## 2018-05-10 NOTE — Telephone Encounter (Signed)
Returned patient's phone call regarding an appointment, patient wanted to confirm date and time. Patient is notified.  °

## 2018-05-15 ENCOUNTER — Telehealth: Payer: Self-pay | Admitting: Internal Medicine

## 2018-05-15 DIAGNOSIS — C672 Malignant neoplasm of lateral wall of bladder: Secondary | ICD-10-CM | POA: Diagnosis not present

## 2018-05-15 DIAGNOSIS — R300 Dysuria: Secondary | ICD-10-CM | POA: Diagnosis not present

## 2018-05-15 NOTE — Telephone Encounter (Signed)
Primary Pulmonologist: MR Last office visit and with whom: 12/13/17 with MR What do we see them for (pulmonary problems): COPD Last OV assessment/plan: Instructions     ICD-10-CM   1. COPD GOLD II J44.9     Stable COPD [place] happy 89[birth year] birthday October 2017  Plan -Continue Stiolto daily -Use albuterol as needed -Continue nighttime oxygen with exertional oxygen in the daytime as needed -Best wishes for bladder cancer therapy - Please talk to PCP [name], MD -  and ensure you get  shingrix (GSK) inactivated vaccine against shingles   Follow-up 6 months or sooner if needed      Was appointment offered to patient (explain)?  Pt and wife want recommendations   Reason for call: called and spoke with pt's wife [name] who states for the last 4-5 days, pt has not been doing O2 during the day even though he has POC as pt has felt like he has not needed it but does wear 2L O2 at night. Gwen stated when pt goes to the mailbox and back, pt's breathing is worse and has to sit to be able to get his breath back.  Pt's O2 sats on room air was 90% while speaking with pt's wife and pulse was 85. Due to pt's breathing being worse over the last few days, pt and wife [name] want to know recommendations.   Pt has not had any fever, no recent travel, has not been around anyone that has been sick.

## 2018-05-15 NOTE — Telephone Encounter (Signed)
Please advise for patient to try to use 2 L O2 with exertion and use inhalers as directed. Please call back if symptoms do not improve.

## 2018-05-15 NOTE — Telephone Encounter (Signed)
Called and spoke with pt's wife [name] stating to her for pt to use O2 with exertion as directed and also to use his inhalers as directed and if pt was still having problems after this to call us back if symptoms did not improve. Gwen expressed understanding. Nothing further needed.

## 2018-05-16 ENCOUNTER — Ambulatory Visit (HOSPITAL_COMMUNITY): Admission: RE | Admit: 2018-05-16 | Payer: Medicare Other | Source: Ambulatory Visit

## 2018-05-16 ENCOUNTER — Inpatient Hospital Stay: Payer: Medicare Other | Attending: Oncology

## 2018-05-16 ENCOUNTER — Encounter (HOSPITAL_COMMUNITY): Payer: Self-pay

## 2018-05-16 ENCOUNTER — Other Ambulatory Visit: Payer: Self-pay

## 2018-05-16 ENCOUNTER — Ambulatory Visit (HOSPITAL_COMMUNITY)
Admission: RE | Admit: 2018-05-16 | Discharge: 2018-05-16 | Disposition: A | Discharge status: Home / Self Care | Payer: Medicare Other | Source: Ambulatory Visit | Attending: Oncology | Admitting: Oncology

## 2018-05-16 ENCOUNTER — Ambulatory Visit (HOSPITAL_COMMUNITY): Payer: Medicare Other

## 2018-05-16 DIAGNOSIS — C679 Malignant neoplasm of bladder, unspecified: Secondary | ICD-10-CM | POA: Diagnosis not present

## 2018-05-16 DIAGNOSIS — C672 Malignant neoplasm of lateral wall of bladder: Secondary | ICD-10-CM | POA: Diagnosis not present

## 2018-05-16 LAB — CBC WITH DIFFERENTIAL (CANCER CENTER ONLY)
Abs Immature Granulocytes: 0.05 10*3/uL (ref 0.00–0.07)
Basophils Absolute: 0 10*3/uL (ref 0.0–0.1)
Basophils Relative: 0 %
Eosinophils Absolute: 0.1 10*3/uL (ref 0.0–0.5)
Eosinophils Relative: 1 %
HCT: 42.3 % (ref 39.0–52.0)
Hemoglobin: 13.1 g/dL (ref 13.0–17.0)
Immature Granulocytes: 1 %
Lymphocytes Relative: 4 %
Lymphs Abs: 0.4 10*3/uL — ABNORMAL LOW (ref 0.7–4.0)
MCH: 28.9 pg (ref 26.0–34.0)
MCHC: 31 g/dL (ref 30.0–36.0)
MCV: 93.2 fL (ref 80.0–100.0)
Monocytes Absolute: 0.8 10*3/uL (ref 0.1–1.0)
Monocytes Relative: 8 %
Neutro Abs: 9.6 10*3/uL — ABNORMAL HIGH (ref 1.7–7.7)
Neutrophils Relative %: 86 %
Platelet Count: 220 10*3/uL (ref 150–400)
RBC: 4.54 MIL/uL (ref 4.22–5.81)
RDW: 16 % — ABNORMAL HIGH (ref 11.5–15.5)
WBC Count: 11 10*3/uL — ABNORMAL HIGH (ref 4.0–10.5)
nRBC: 0 % (ref 0.0–0.2)

## 2018-05-16 LAB — CMP (CANCER CENTER ONLY)
ALT: 18 U/L (ref 0–44)
AST: 11 U/L — ABNORMAL LOW (ref 15–41)
Albumin: 3.3 g/dL — ABNORMAL LOW (ref 3.5–5.0)
Alkaline Phosphatase: 88 U/L (ref 38–126)
Anion gap: 10 (ref 5–15)
BUN: 29 mg/dL — ABNORMAL HIGH (ref 8–23)
CO2: 31 mmol/L (ref 22–32)
Calcium: 9.6 mg/dL (ref 8.9–10.3)
Chloride: 98 mmol/L (ref 98–111)
Creatinine: 1.24 mg/dL (ref 0.61–1.24)
GFR, Est AFR Am: 59 mL/min — ABNORMAL LOW (ref >60)
GFR, Estimated: 51 mL/min — ABNORMAL LOW (ref >60)
Glucose, Bld: 93 mg/dL (ref 70–99)
Potassium: 4.5 mmol/L (ref 3.5–5.1)
Sodium: 139 mmol/L (ref 135–145)
Total Bilirubin: 0.6 mg/dL (ref 0.3–1.2)
Total Protein: 7 g/dL (ref 6.5–8.1)

## 2018-05-16 MED ORDER — SODIUM CHLORIDE 0.9 % IV SOLN
INTRAVENOUS | Status: AC
  Filled 2018-05-16: qty 250

## 2018-05-16 MED ORDER — SODIUM CHLORIDE (PF) 0.9 % IJ SOLN
INTRAMUSCULAR | Status: AC
  Filled 2018-05-16: qty 50

## 2018-05-16 MED ORDER — IOHEXOL 300 MG/ML  SOLN
100.0000 mL | Freq: Once | INTRAMUSCULAR | Status: AC | PRN
  Administered 2018-05-16: 100 mL via INTRAVENOUS

## 2018-05-17 ENCOUNTER — Telehealth: Payer: Self-pay

## 2018-05-17 NOTE — Telephone Encounter (Signed)
Contacted pt's wife to inquire if pt was experiencing any lingering effects from radiation. Wife states pt is not having lingering effects from radiation. Conveyed to wife that since Covid 19 concerns were ongoing and that appt with Dr. Alen Blew scheduled for tomorrow was marked as a phone call, appt with Dr. Sondra Come would be canceled. Wife was unaware that appt with Dr. Alen Blew was via phone and requested this RN to contact Dr. Hazeline Junker office and have nurse contact wife. Instructed wife to contact this RN with any future questions/concerns re: Rad Onc. Appt with Dr. Sondra Come for tomorrow will be canceled. Wife verbalized understanding and agreement. Loma Sousa, RN BSN

## 2018-05-18 ENCOUNTER — Ambulatory Visit: Payer: Medicare Other | Admitting: Radiation Oncology

## 2018-05-18 ENCOUNTER — Inpatient Hospital Stay (HOSPITAL_BASED_OUTPATIENT_CLINIC_OR_DEPARTMENT_OTHER): Payer: Medicare Other | Admitting: Oncology

## 2018-05-18 DIAGNOSIS — C7800 Secondary malignant neoplasm of unspecified lung: Secondary | ICD-10-CM

## 2018-05-18 DIAGNOSIS — Z9221 Personal history of antineoplastic chemotherapy: Secondary | ICD-10-CM | POA: Diagnosis not present

## 2018-05-18 DIAGNOSIS — C672 Malignant neoplasm of lateral wall of bladder: Secondary | ICD-10-CM | POA: Diagnosis not present

## 2018-05-18 DIAGNOSIS — C7951 Secondary malignant neoplasm of bone: Secondary | ICD-10-CM | POA: Diagnosis not present

## 2018-05-18 DIAGNOSIS — Z923 Personal history of irradiation: Secondary | ICD-10-CM

## 2018-05-18 NOTE — Progress Notes (Signed)
Hematology and Oncology Follow Up for Telemedicine Visits  BRASEN BUNDREN Sr. [id number] October 15, 1925 85 y.o. 05/18/2018 3:17 PM [name], MDPaterson, [name], MD   I connected with [name] on 05/18/18 at  3:45 PM EDT by telephone visit and verified that I am speaking with the correct person using two identifiers.   I discussed the limitations, risks, security and privacy concerns of performing an evaluation and management service by telemedicine and the availability of in-person appointments. I also discussed with the patient that there may be a patient responsible charge related to this service. The patient expressed understanding and agreed to proceed.  Other persons participating in the visit and their role in the encounter: His spouse [name]  Patient's location: Home Provider's location: Office  Chief Complaint: I'm short of breath  Principle Diagnosis: 85 year-old man with bladder cancer diagnosed in 2019.  He was found to have T2N0 disease.  Prior Therapy:  He is status post multiple cystoscopy and fulguration in the past.  He also received BCG 2014 and was discontinued because of fevers. He is status post epirubicin in May 2019 for high-grade T1 disease. TURBT completed on 11/29/2017 in the care of Dr. Junious Silk.  Final pathology showed a muscle invasive tumor. Radiation and weekly carboplatin chemotherapy started on 12/19/2017.  Therapy completed in January 2020.  Current therapy: Active surveillance.   Interim History: [name] reports overall decline in his health.  He reports symptoms of shortness of breath and dyspnea on exertion and limited mobility.  He is spending more time in a chair and currently uses oxygen around-the-clock.  He has denied any cough or hemoptysis but does report overall weakness and lack of appetite.  He feels he has lost weight.  He denies any pelvic pain or back pain.  He denies any neurological deficits.    Medications: I have  reviewed the patient's current medications.  Current Outpatient Medications  Medication Sig Dispense Refill  . atorvastatin (LIPITOR) 20 MG tablet Take 20 mg by mouth daily after breakfast.     . iron polysaccharides (NIFEREX) 150 MG capsule Take 150 mg by mouth 3 (three) times a week.     . levothyroxine (SYNTHROID, LEVOTHROID) 175 MCG tablet Take 175 mcg by mouth daily after breakfast.     . mirabegron ER (MYRBETRIQ) 25 MG TB24 tablet Take 25 mg by mouth daily.    . nitroGLYCERIN (NITRODUR - DOSED IN MG/24 HR) 0.1 mg/hr patch Place 0.1 mg onto the skin daily.    . nitroGLYCERIN (NITROSTAT) 0.4 MG SL tablet Place 1 tablet (0.4 mg total) under the tongue every 5 (five) minutes as needed for chest pain. 30 tablet 12  . pantoprazole (PROTONIX) 40 MG tablet Take 1 tablet (40 mg total) by mouth daily. (Patient taking differently: Take 40 mg by mouth daily after breakfast. ) 60 tablet 0  . phenazopyridine (PYRIDIUM) 100 MG tablet Take 1 tablet (100 mg total) by mouth 3 (three) times daily as needed for pain. (Patient not taking: Reported on 02/27/2018) 30 tablet 1  . phenazopyridine (PYRIDIUM) 200 MG tablet Take 200 mg by mouth 3 (three) times daily as needed.    . polyethylene glycol powder (GLYCOLAX/MIRALAX) powder Take 17 g by mouth daily as needed (for constipation).    . sodium chloride (MURO 128) 5 % ophthalmic solution Place 1 drop into both eyes 3 (three) times daily as needed for eye irritation.    [name] STIOLTO RESPIMAT 2.5-2.5 MCG/ACT AERS INHALE 2 PUFFS INTO LUNGS  ONCE DAILY 4 g 5  . testosterone enanthate (DELATESTRYL) 200 MG/ML injection Inject 200 mg into the muscle every 28 (twenty-eight) days. For IM use only    . triamterene-hydrochlorothiazide (MAXZIDE-25) 37.5-25 MG tablet Take 1 tablet by mouth daily.  0  . vitamin B-12 (CYANOCOBALAMIN) 1000 MCG tablet Take 1,000 mcg by mouth 3 (three) times a week.    . Vitamin D, Ergocalciferol, (DRISDOL) 50000 units CAPS capsule Take 50,000 Units by  mouth every Monday.      No current facility-administered medications for this visit.      Allergies:  Allergies  Allergen Reactions  . Doxycycline     dizziness    Past Medical History, Surgical history, Social history, and Family History were reviewed and updated.  .    Lab Results: Lab Results  Component Value Date   WBC 11.0 (H) 05/16/2018   HGB 13.1 05/16/2018   HCT 42.3 05/16/2018   MCV 93.2 05/16/2018   PLT 220 05/16/2018     Chemistry      Component Value Date/Time   NA 139 05/16/2018 1002   K 4.5 05/16/2018 1002   CL 98 05/16/2018 1002   CO2 31 05/16/2018 1002   BUN 29 (H) 05/16/2018 1002   CREATININE 1.24 05/16/2018 1002      Component Value Date/Time   CALCIUM 9.6 05/16/2018 1002   ALKPHOS 88 05/16/2018 1002   AST 11 (L) 05/16/2018 1002   ALT 18 05/16/2018 1002   BILITOT 0.6 05/16/2018 1002       Radiological Studies: EXAM: CT CHEST, ABDOMEN, AND PELVIS WITH CONTRAST  TECHNIQUE: Multidetector CT imaging of the chest, abdomen and pelvis was performed following the standard protocol during bolus administration of intravenous contrast.  CONTRAST:  128mL OMNIPAQUE IOHEXOL 300 MG/ML  SOLN  COMPARISON:  02/27/2018 and prior studies.  FINDINGS: CT CHEST FINDINGS  Cardiovascular: Heart is normal in size and configuration. No pericardial effusion. Three-vessel coronary artery calcifications. Great vessels are normal in caliber. There is aortic and aortic arch branch vessel atherosclerosis with no significant stenosis.  Mediastinum/Nodes: Pattern mm short axis right paratracheal lymph node adjacent to the azygos arch, new since the prior CT. No other mediastinal or hilar adenopathy. No masses. No neck base or axillary masses or adenopathy. Trachea is widely patent. Esophagus is unremarkable.  Lungs/Pleura: Significant progression of lung metastatic disease.  Large pleural based mass in the anteromedial right upper lobe  now measures 4.5 x 2.4 x 4.3 cm, previously 1.5 x 1.4 cm transversely. Mass is centered on image 46, series 6.  New 11 mm nodule, left upper lobe, image 45.  Two adjacent pleural based nodules, lateral right upper lobe, both new, largest measuring 14 x 12 mm. Image 58, series 6.  New pleural based nodule, right upper lobe, image 64.  Ill-defined nodule, left upper lobe, image 72, 13 x 8 mm, previously 6 mm in greatest dimension.  8 mm nodule, anterior inferior right upper lobe, image 83, approximately 3 mm on the prior study.  New 13 mm superior right middle lobe nodule, image 95.  Central left upper lobe nodule, image 95, 14 x 12 mm, previously 10 x 7 mm. Image 95.  11 mm nodule, left lower lobe, image 105, previously 5-6 mm.  11 mm nodule, right lower lobe, image 104, previously 4 mm.  2.4 x 1.4 cm nodule, pleural based, superior segment right lower lobe, previously 9 x 7 mm. This is confluent with additional pleural based masses that extend to  the posterior costophrenic sulcus. There is additional opacity at the base of the right middle lobe and there are multiple smaller bilateral new pulmonary nodules.  Minimal right pleural effusion.  Musculoskeletal: No fracture or acute finding. No osteoblastic or osteolytic lesions.  CT ABDOMEN PELVIS FINDINGS  Hepatobiliary: Enhancing lesion noted previously, segment 7, not evident on the current exam. Low-attenuation mass in segment 2 is unchanged. No other liver lesions. Normal gallbladder. No bile duct dilation.  Pancreas: Unremarkable. No pancreatic ductal dilatation or surrounding inflammatory changes.  Spleen: Normal in size without focal abnormality.  Adrenals/Urinary Tract: No adrenal masses.  Mild bilateral renal cortical thinning. Multiple bilateral low-attenuation renal masses consistent with cysts, unchanged. New or suspicious renal masses, no stones and no hydronephrosis. Ureters normal in  course and in caliber.  Bladder wall diverticula again noted. No bladder mass or significant wall thickening. Right lateral posterior irregular wall thickening noted on the prior CT is no longer evident. No bladder stones.  Stomach/Bowel: Stomach is unremarkable. Small bowel is normal caliber. No wall thickening. No inflammation.  Area of circumferential narrowing with questionable wall thickening the right colon just inferior to the hepatic flexure. This may reflect an area of spasm. A constricting mass is not excluded. No other evidence colon wall thickening. There are scattered diverticula. No diverticulitis or other colonic inflammatory process. Normal appendix visualized.  Vascular/Lymphatic: Stable aortic atherosclerosis. No aneurysm. No enlarged lymph nodes.  Reproductive: TURP defect.  Prostate normal in size.  Other: No abdominal wall hernia or abnormality. No abdominopelvic ascites.  Musculoskeletal: Destructive bone lesion, right mid sacrum, with soft tissue minimally extending into the presacral fat. Mass encroaches upon sacral spinal canal lower neural foramina. Lesion is new since the prior exam. No other destructive bone lesion.  IMPRESSION: 1. Significant progression of metastatic disease with enlarged pulmonary nodules masses and new pulmonary nodules. Single prominent right paratracheal lymph node is also new. There is an associated minimal right pleural effusion. 2. Lytic bone lesion in the right mid sacrum encroaches upon the spinal canal and neural foramina, consistent with metastatic disease, new since the prior study. 3. No other evidence metastatic disease from bladder carcinoma. 4. Irregular bladder wall thickening noted on the prior CT has resolved. 5. Questionable constricting lesion versus spasm in the right colon. Recommend follow-up colonoscopy for further assessment, versus close attention on follow-up CT imaging.    Impression  and Plan:  85 year-old man with:  1.   T2N0 bladder cancer diagnosed in December 2019 and received definitive therapy concluded in January 2020.  CT scan obtained on May 2020 was personally reviewed and showed progression of disease predominantly outside of the bladder.  He has pulmonary metastasis as well as sacral metastasis.  The natural course of this disease and these findings were reviewed today with the patient and his wife.  These findings represent stage IV disease that is incurable and has limited treatment options.  Options of therapy were reviewed today which include systemic therapy utilizing chemotherapy or immunotherapy versus best supportive care.  Given his advanced age, debilitation as well as symptoms, I have recommended proceeding with supportive management as well as hospice care.  He would be a marginal candidate for immunotherapy if he elects to proceed that route.  After discussion today, he wants to think about this decision and let me know in the near future.  2.  Prognosis: Poor with limited life expectancy.  Life expectancy of less than 6 months without treatment is unlikely not much  different with treatment.  3. Follow-up: To be determined based on his decision.      I discussed the assessment and treatment plan with the patient. The patient was provided an opportunity to ask questions and all were answered. The patient agreed with the plan and demonstrated an understanding of the instructions.   The patient was advised to call back or seek an in-person evaluation if the symptoms worsen or if the condition fails to improve as anticipated.  I provided 20 minutes of non face-to-face telephone visit time during this encounter, and > 50% of the time dedicated to reviewing his laboratory data, imaging studies, discussing treatment options and future plan of care.  Zola Button, MD 05/18/2018 3:17 PM

## 2018-05-19 ENCOUNTER — Telehealth: Payer: Self-pay

## 2018-05-19 ENCOUNTER — Other Ambulatory Visit: Payer: Self-pay

## 2018-05-19 DIAGNOSIS — C672 Malignant neoplasm of lateral wall of bladder: Secondary | ICD-10-CM

## 2018-05-19 NOTE — Progress Notes (Signed)
[facility]ospic

## 2018-05-19 NOTE — Telephone Encounter (Signed)
Received a call from patient's wife that patient has decided he wants a referral to hospice. Called and spoke to [place] at [facility]. Referral placed in Epic and referral sheet faxed with confirmation received.

## 2018-07-12 DEATH — deceased

## 2018-07-18 ENCOUNTER — Telehealth: Payer: Self-pay | Admitting: *Deleted

## 2018-07-18 NOTE — Telephone Encounter (Signed)
Opened in error

## 2019-05-08 IMAGING — CT CT CHEST HIGH RESOLUTION W/O CM
2 of 6 series · 14 of 36 positions shown, 17 images · non-contrast
Comparison: 05/02/2015 chest radiograph.

CLINICAL DATA: Dyspnea. Restrictive lung disease. History of
bladder and thyroid cancer. Evaluate for pulmonary fibrosis.

EXAM:
CT CHEST WITHOUT CONTRAST
TECHNIQUE: Multidetector CT imaging of the chest was performed following the
standard protocol without intravenous contrast. High resolution
imaging of the lungs, as well as inspiratory and expiratory imaging,
was performed.

[Series 4: high resolution · axial · 0.70mm/px · z∈[-344,-50]mm · 11 of 165 slices shown, 14 images]
[im 9/165  mediastinal]
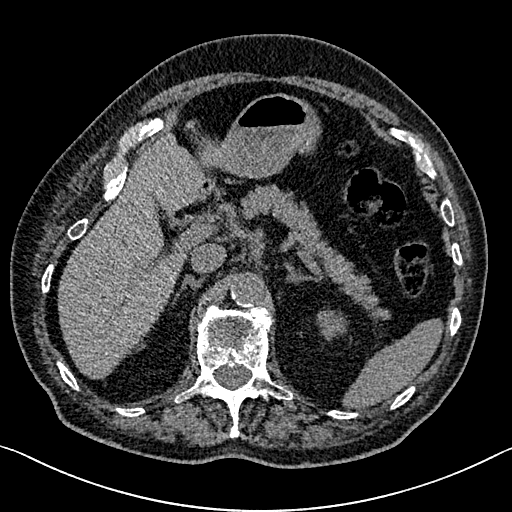
[im 9/165  lung]
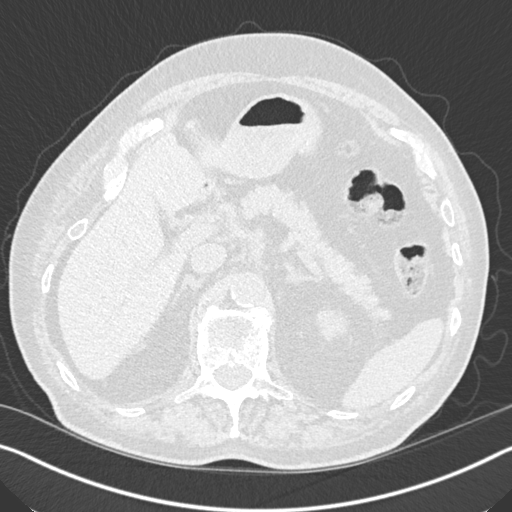
[im 26/165  lung]
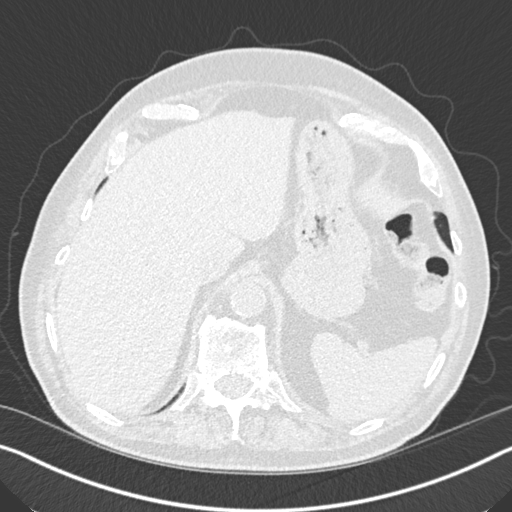
[im 44/165  lung]
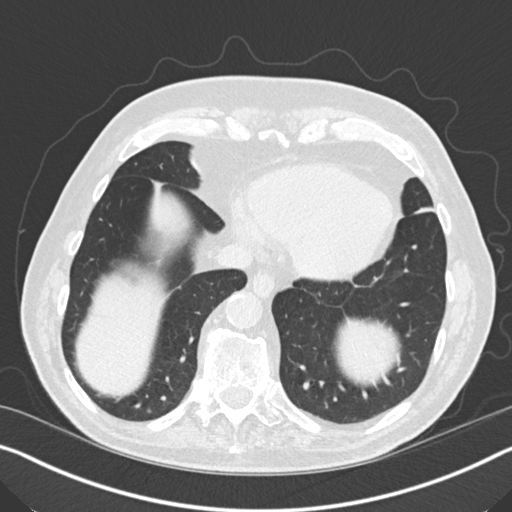
[im 52/165  lung]
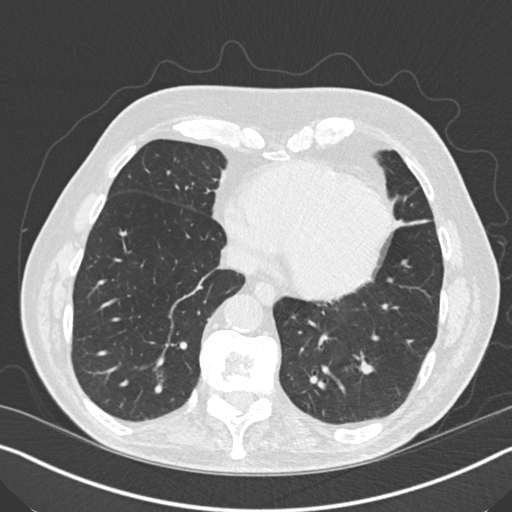
[im 70/165  mediastinal]
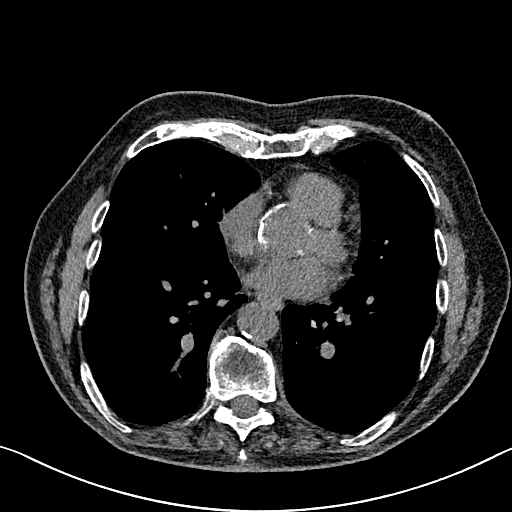
[im 70/165  lung]
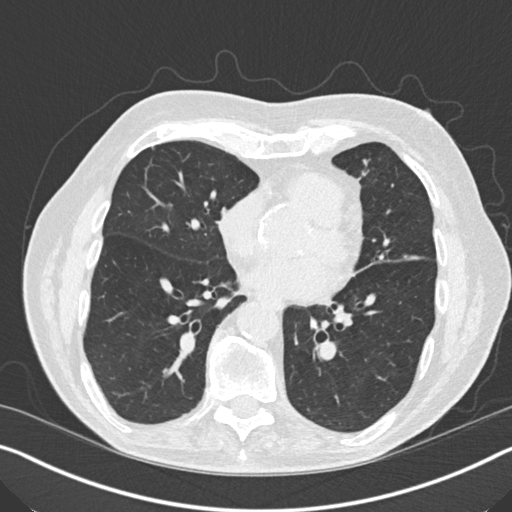
[im 87/165  lung]
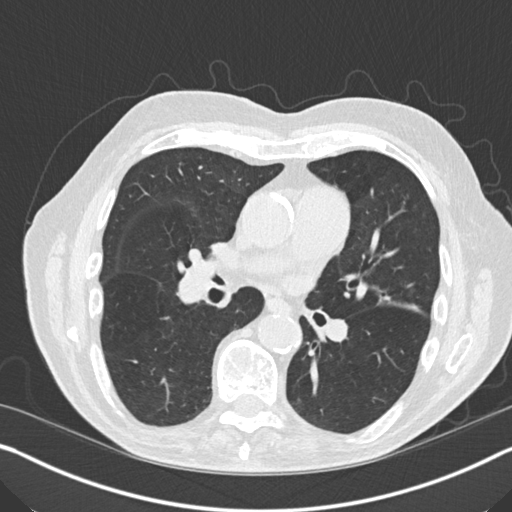
[im 95/165  lung]
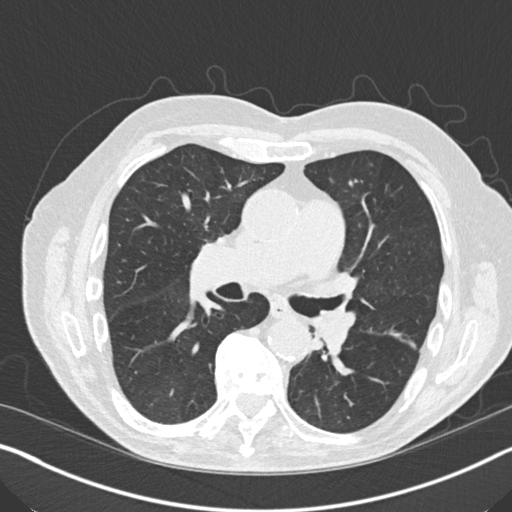
[im 113/165  lung]
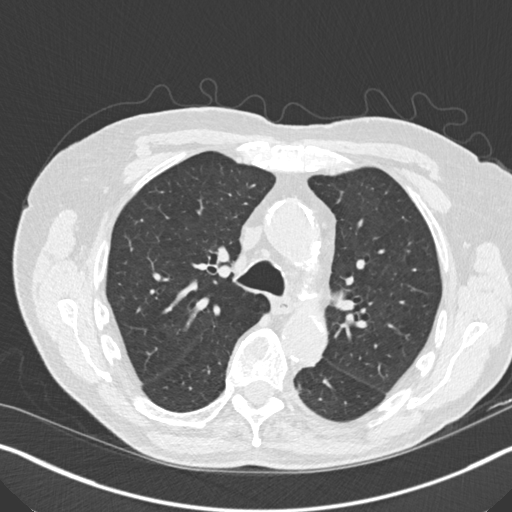
[im 121/165  mediastinal]
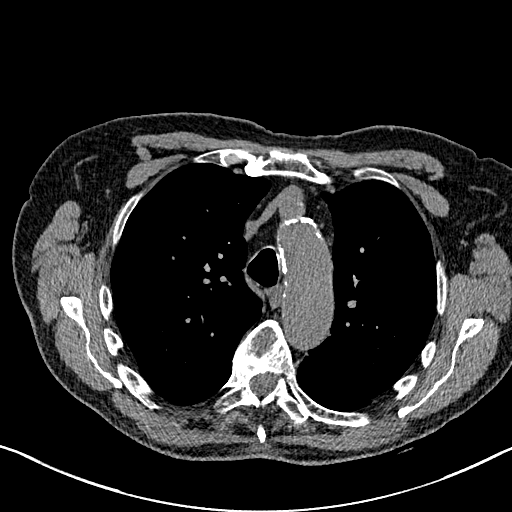
[im 121/165  lung]
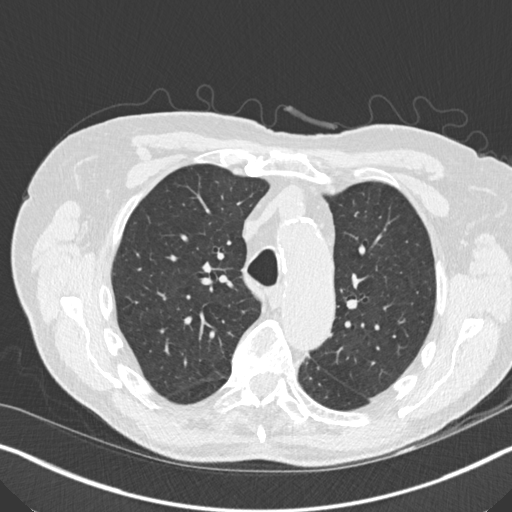
[im 139/165  lung]
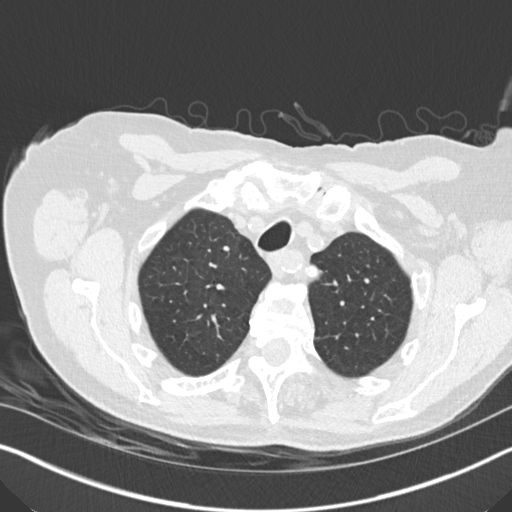
[im 156/165  lung]
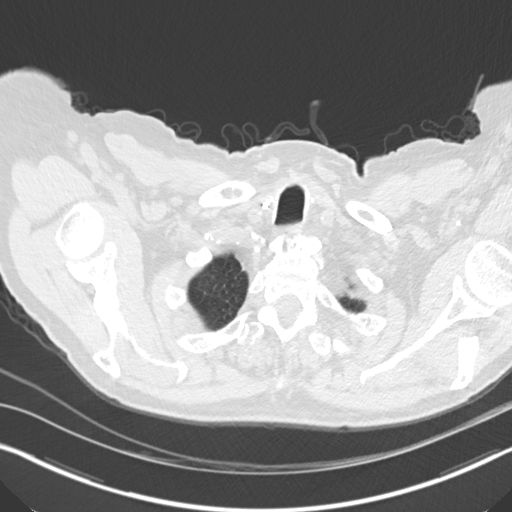

[Series 8: coronal · coronal · 0.63mm/px · 3 of 136 slices shown]
[im 28/136  lung]
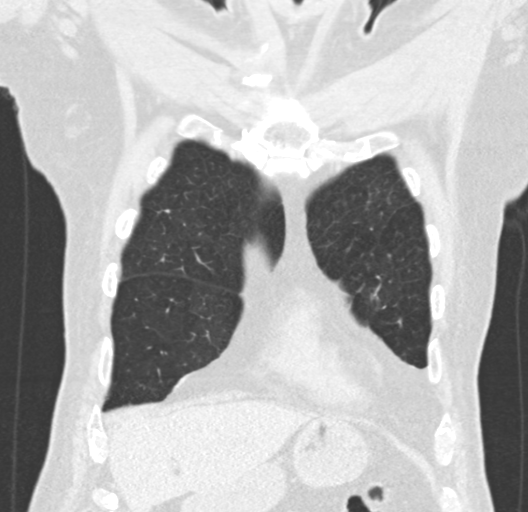
[im 55/136  lung]
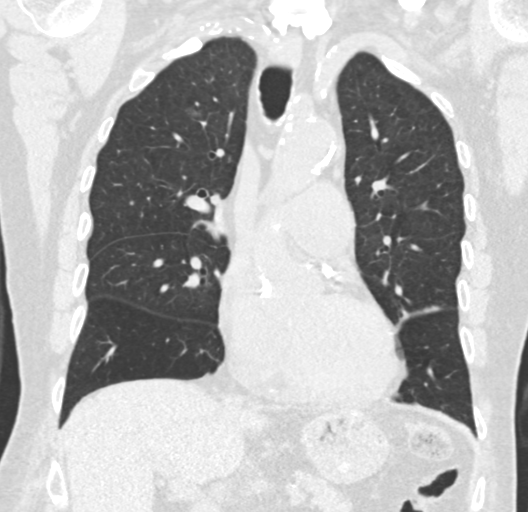
[im 82/136  lung]
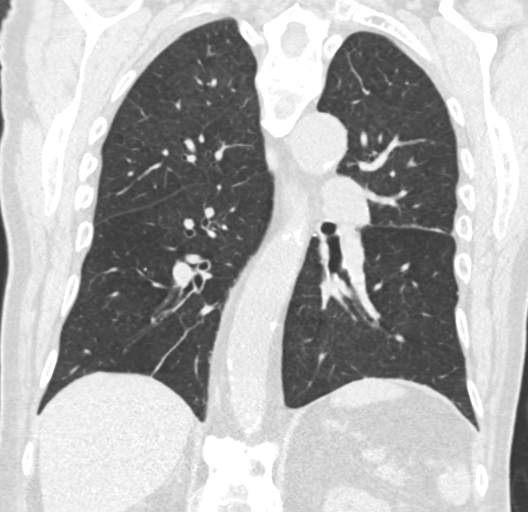

[14 of 36 positions shown; findings below may reference images not displayed]

FINDINGS: Cardiovascular: Normal heart size. No significant pericardial
fluid/thickening. Left main, left anterior descending, left
circumflex and right coronary atherosclerosis. Atherosclerotic
nonaneurysmal thoracic aorta. Aberrant nonaneurysmal right
subclavian artery arising from the distal arch with retroesophageal
course. Dilated main pulmonary artery (3.6 cm diameter).

Mediastinum/Nodes: Status post total thyroidectomy. Unremarkable
esophagus. No pathologically enlarged axillary, mediastinal or gross
hilar lymph nodes, noting limited sensitivity for the detection of
hilar adenopathy on this noncontrast study.

Lungs/Pleura: No pneumothorax. No pleural effusion. Scattered tiny
calcified granulomas in the right lung. Subpleural 4 mm peripheral
right upper lobe solid pulmonary nodule (series 5/ image 29).
Ground-glass 1.1 cm right upper lobe pulmonary nodule (series
5/image 38). Lingular 3 mm solid pulmonary nodule (series 5/ image
103), stable since 05/30/2013 CT abdomen study, considered benign.
Subpleural solid 4 mm posterior superior segment left lower lobe
pulmonary nodule (series 5/ image 64). No acute consolidative
airspace disease, lung masses or additional significant pulmonary
nodules. Moderate centrilobular emphysema. No significant lobular
air trapping on the expiration sequence. Small scattered parenchymal
bands in the inferior segment lingula, medial segment right middle
lobe and bilateral lower lobes, unchanged since 05/30/2013,
compatible with mild postinfectious/ postinflammatory scarring. No
significant regions of subpleural reticulation, ground-glass
attenuation, traction bronchiectasis, architectural distortion or
frank honeycombing.

Upper abdomen: Unremarkable.

Musculoskeletal: No aggressive appearing focal osseous lesions.
Marked thoracic spondylosis.
IMPRESSION: 1. No evidence of interstitial lung disease. Stable
postinfectious/postinflammatory scarring in the mid to lower lungs.
2. Two solid subpleural 4 mm pulmonary nodules, probably benign.
Initial chest CT follow-up recommended in 3-6 months given the
history of thyroid and bladder cancer.
3. Ground-glass 1.1 cm right upper lobe pulmonary nodule. Initial
follow-up with CT at 6-12 months is recommended to confirm
persistence. If persistent, repeat CT is recommended every 2 years
until 5 years of stability has been established. This recommendation
follows the consensus statement: Guidelines for Management of
Incidental Pulmonary Nodules Detected on CT Images: From the
4. Left main and 3 vessel coronary atherosclerosis.
5. Aberrant nonaneurysmal right subclavian artery.
6. Dilated main pulmonary artery, suggesting pulmonary arterial
hypertension.

Aortic Atherosclerosis (PURU8-9OK.K) and Emphysema (PURU8-17T.S).

## 2019-12-08 IMAGING — DX DG CHEST 2V
2 series · 2 of 2 positions shown · non-contrast
Comparison: 01/31/2017

CLINICAL DATA: Shortness of breath with exertion. COPD,
hypertension.

EXAM:
CHEST - 2 VIEW

[chest pa]
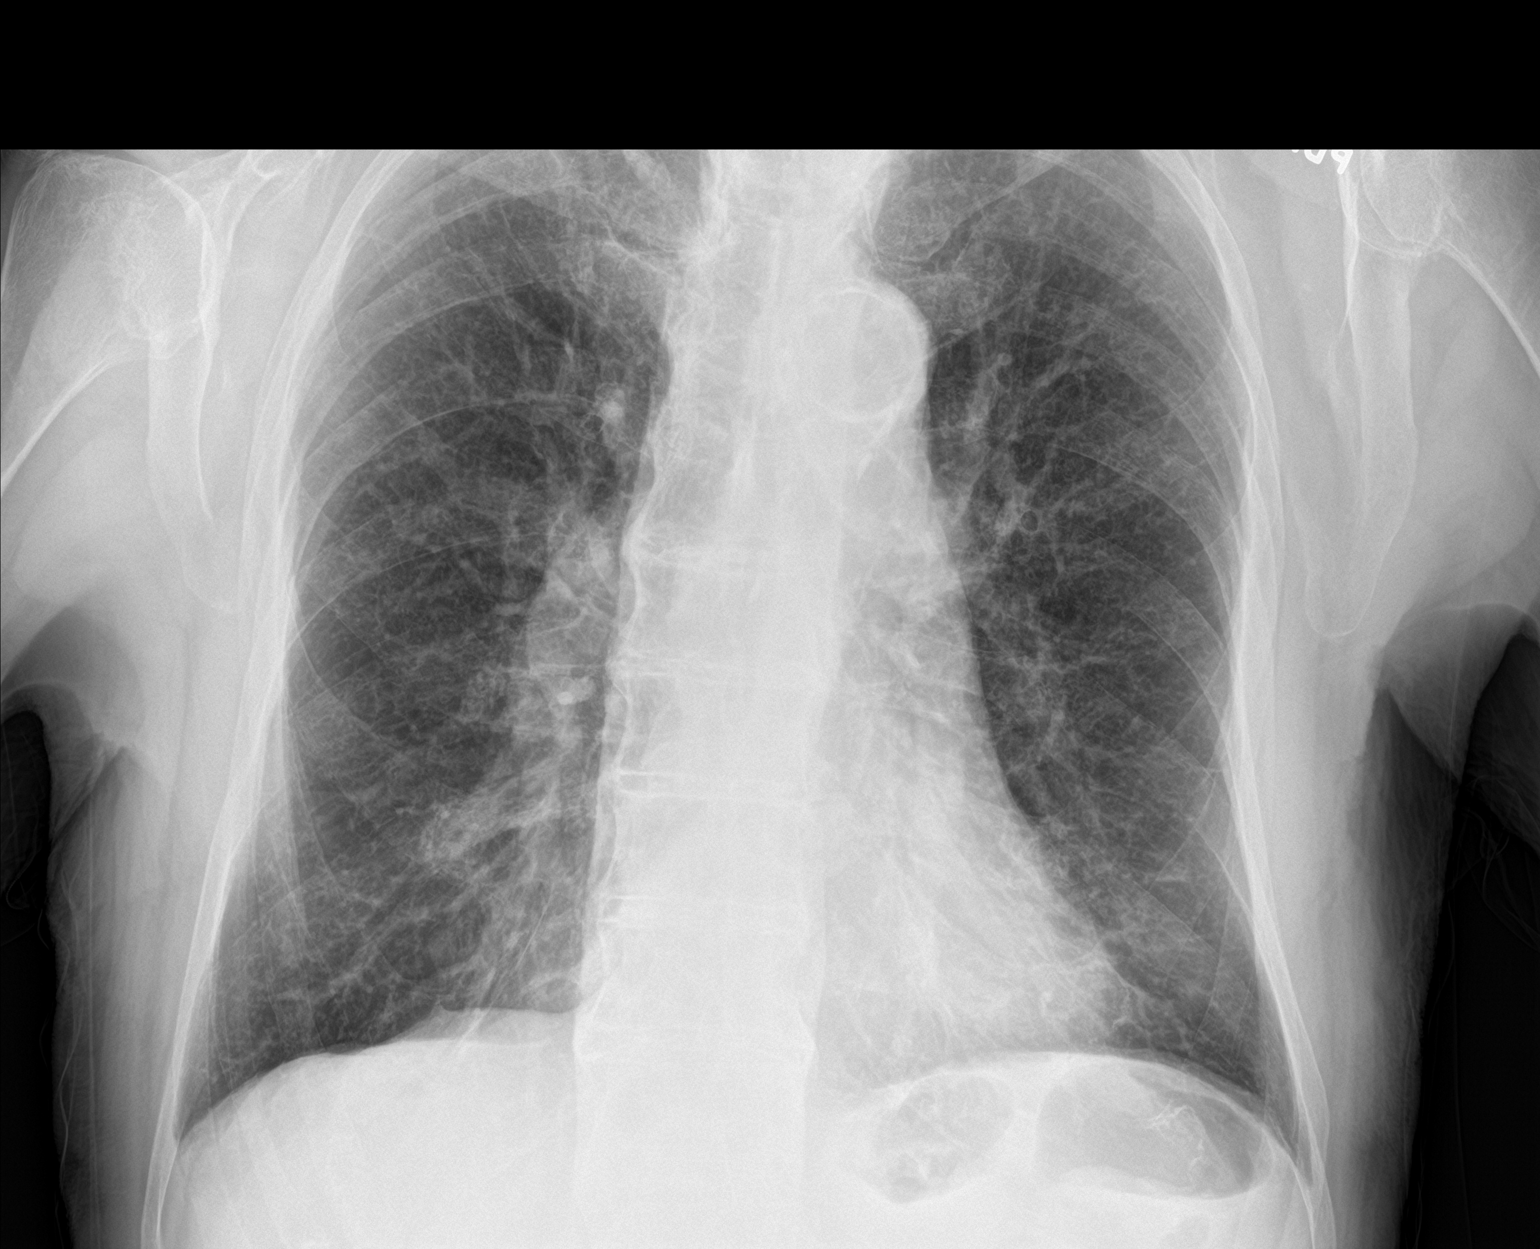

[chest lat]
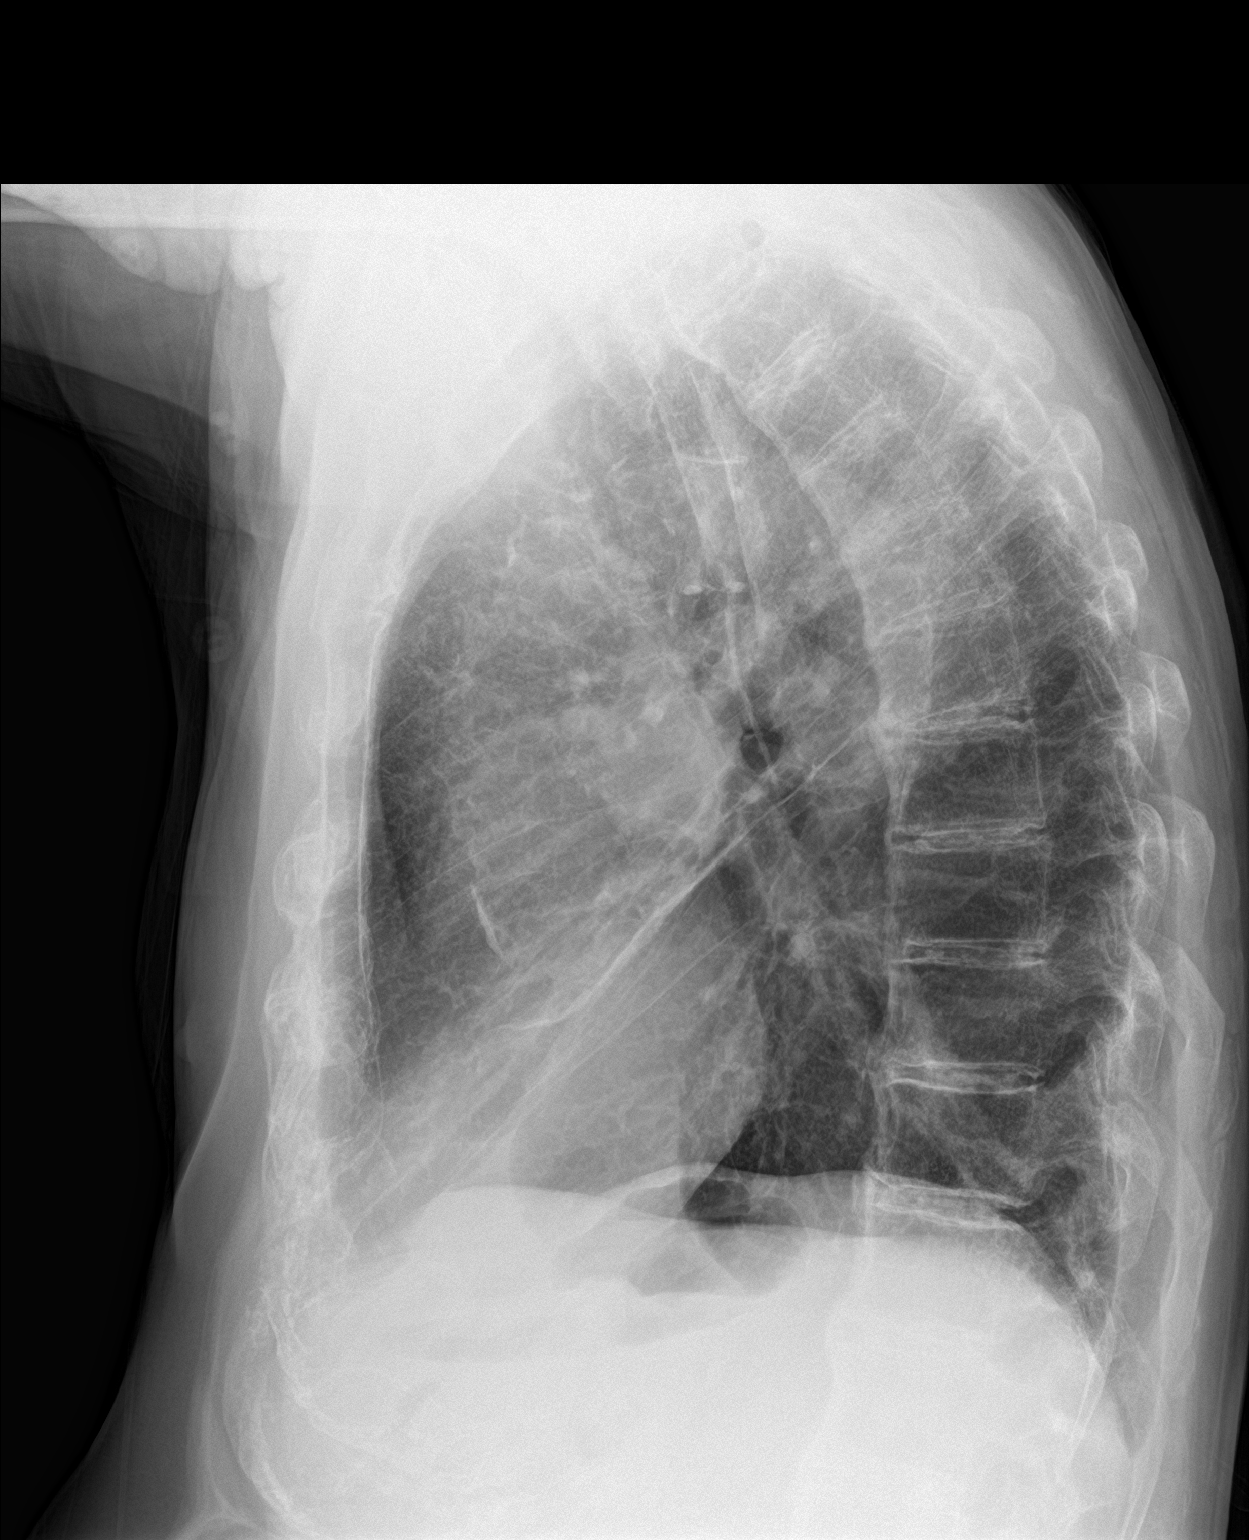

[2 of 2 positions shown; findings below may reference images not displayed]

FINDINGS: Hyperinflation of the lungs compatible with COPD. Stable. Stable
areas of scarring in the lungs. No acute confluent airspace
opacities or effusions. Heart is normal size. Aortic
calcifications/atherosclerosis, most notable in the aortic arch. No
acute bony abnormality.
IMPRESSION: COPD/chronic changes.  No active disease.
